# Patient Record
Sex: Female | Born: 1935 | Race: White | Hispanic: No | Marital: Married | Smoking: Former smoker
Health system: Southern US, Community
[De-identification: ages and names within clinical notes are randomized; demographics above are authoritative.]

## PROBLEM LIST (undated history)

## (undated) DIAGNOSIS — M069 Rheumatoid arthritis, unspecified: Secondary | ICD-10-CM

## (undated) DIAGNOSIS — E039 Hypothyroidism, unspecified: Secondary | ICD-10-CM

## (undated) DIAGNOSIS — N189 Chronic kidney disease, unspecified: Secondary | ICD-10-CM

## (undated) DIAGNOSIS — I10 Essential (primary) hypertension: Secondary | ICD-10-CM

## (undated) DIAGNOSIS — R32 Unspecified urinary incontinence: Secondary | ICD-10-CM

## (undated) DIAGNOSIS — I482 Chronic atrial fibrillation, unspecified: Secondary | ICD-10-CM

## (undated) DIAGNOSIS — I4891 Unspecified atrial fibrillation: Secondary | ICD-10-CM

## (undated) DIAGNOSIS — M1990 Unspecified osteoarthritis, unspecified site: Secondary | ICD-10-CM

## (undated) HISTORY — PX: ABDOMINAL HYSTERECTOMY: SHX81

## (undated) HISTORY — DX: Hypothyroidism, unspecified: E03.9

## (undated) HISTORY — DX: Chronic atrial fibrillation, unspecified: I48.20

## (undated) HISTORY — DX: Unspecified urinary incontinence: R32

---

## 1988-07-26 HISTORY — PX: ABDOMINAL HYSTERECTOMY: SHX81

## 2012-11-30 ENCOUNTER — Encounter (HOSPITAL_BASED_OUTPATIENT_CLINIC_OR_DEPARTMENT_OTHER): Payer: Medicare Other

## 2013-01-08 DIAGNOSIS — I272 Pulmonary hypertension, unspecified: Secondary | ICD-10-CM | POA: Insufficient documentation

## 2013-01-08 DIAGNOSIS — E785 Hyperlipidemia, unspecified: Secondary | ICD-10-CM | POA: Insufficient documentation

## 2013-05-17 ENCOUNTER — Emergency Department (HOSPITAL_COMMUNITY): Payer: Medicare Other

## 2013-05-17 ENCOUNTER — Emergency Department (HOSPITAL_COMMUNITY)
Admission: EM | Admit: 2013-05-17 | Discharge: 2013-05-18 | Disposition: A | Discharge status: Home / Self Care | Payer: Medicare Other | Attending: Emergency Medicine | Admitting: Emergency Medicine

## 2013-05-17 ENCOUNTER — Encounter (HOSPITAL_COMMUNITY): Payer: Self-pay | Admitting: Emergency Medicine

## 2013-05-17 DIAGNOSIS — R141 Gas pain: Secondary | ICD-10-CM | POA: Insufficient documentation

## 2013-05-17 DIAGNOSIS — Z87891 Personal history of nicotine dependence: Secondary | ICD-10-CM | POA: Insufficient documentation

## 2013-05-17 DIAGNOSIS — I10 Essential (primary) hypertension: Secondary | ICD-10-CM | POA: Insufficient documentation

## 2013-05-17 DIAGNOSIS — W010XXA Fall on same level from slipping, tripping and stumbling without subsequent striking against object, initial encounter: Secondary | ICD-10-CM | POA: Insufficient documentation

## 2013-05-17 DIAGNOSIS — Y9301 Activity, walking, marching and hiking: Secondary | ICD-10-CM | POA: Insufficient documentation

## 2013-05-17 DIAGNOSIS — K560 Paralytic ileus: Secondary | ICD-10-CM | POA: Insufficient documentation

## 2013-05-17 DIAGNOSIS — R142 Eructation: Secondary | ICD-10-CM | POA: Insufficient documentation

## 2013-05-17 DIAGNOSIS — R109 Unspecified abdominal pain: Secondary | ICD-10-CM | POA: Insufficient documentation

## 2013-05-17 DIAGNOSIS — M546 Pain in thoracic spine: Secondary | ICD-10-CM | POA: Insufficient documentation

## 2013-05-17 DIAGNOSIS — Z79899 Other long term (current) drug therapy: Secondary | ICD-10-CM | POA: Insufficient documentation

## 2013-05-17 DIAGNOSIS — S32009A Unspecified fracture of unspecified lumbar vertebra, initial encounter for closed fracture: Secondary | ICD-10-CM | POA: Insufficient documentation

## 2013-05-17 DIAGNOSIS — Z7901 Long term (current) use of anticoagulants: Secondary | ICD-10-CM | POA: Insufficient documentation

## 2013-05-17 DIAGNOSIS — M129 Arthropathy, unspecified: Secondary | ICD-10-CM | POA: Insufficient documentation

## 2013-05-17 DIAGNOSIS — Y929 Unspecified place or not applicable: Secondary | ICD-10-CM | POA: Insufficient documentation

## 2013-05-17 DIAGNOSIS — K5900 Constipation, unspecified: Secondary | ICD-10-CM | POA: Insufficient documentation

## 2013-05-17 DIAGNOSIS — S32000A Wedge compression fracture of unspecified lumbar vertebra, initial encounter for closed fracture: Secondary | ICD-10-CM

## 2013-05-17 DIAGNOSIS — N949 Unspecified condition associated with female genital organs and menstrual cycle: Secondary | ICD-10-CM | POA: Insufficient documentation

## 2013-05-17 DIAGNOSIS — R112 Nausea with vomiting, unspecified: Secondary | ICD-10-CM | POA: Insufficient documentation

## 2013-05-17 DIAGNOSIS — I4891 Unspecified atrial fibrillation: Secondary | ICD-10-CM | POA: Insufficient documentation

## 2013-05-17 HISTORY — DX: Unspecified atrial fibrillation: I48.91

## 2013-05-17 HISTORY — DX: Essential (primary) hypertension: I10

## 2013-05-17 HISTORY — DX: Unspecified osteoarthritis, unspecified site: M19.90

## 2013-05-17 LAB — CBC WITH DIFFERENTIAL/PLATELET
Eosinophils Absolute: 0.1 10*3/uL (ref 0.0–0.7)
Eosinophils Relative: 2 % (ref 0–5)
Hemoglobin: 11.9 g/dL — ABNORMAL LOW (ref 12.0–15.0)
Lymphs Abs: 1.5 10*3/uL (ref 0.7–4.0)
MCH: 27.4 pg (ref 26.0–34.0)
MCV: 82.5 fL (ref 78.0–100.0)
Monocytes Absolute: 0.8 10*3/uL (ref 0.1–1.0)
Monocytes Relative: 11 % (ref 3–12)
Neutro Abs: 5.1 10*3/uL (ref 1.7–7.7)
Platelets: 456 10*3/uL — ABNORMAL HIGH (ref 150–400)
RBC: 4.34 MIL/uL (ref 3.87–5.11)

## 2013-05-17 LAB — COMPREHENSIVE METABOLIC PANEL WITH GFR
ALT: 26 U/L (ref 0–35)
AST: 42 U/L — ABNORMAL HIGH (ref 0–37)
Albumin: 3.2 g/dL — ABNORMAL LOW (ref 3.5–5.2)
Alkaline Phosphatase: 76 U/L (ref 39–117)
BUN: 21 mg/dL (ref 6–23)
CO2: 29 meq/L (ref 19–32)
Calcium: 10.4 mg/dL (ref 8.4–10.5)
Chloride: 93 meq/L — ABNORMAL LOW (ref 96–112)
Creatinine, Ser: 0.79 mg/dL (ref 0.50–1.10)
GFR calc Af Amer: 89 mL/min — ABNORMAL LOW
GFR calc non Af Amer: 77 mL/min — ABNORMAL LOW
Glucose, Bld: 163 mg/dL — ABNORMAL HIGH (ref 70–99)
Potassium: 4 meq/L (ref 3.5–5.1)
Sodium: 134 meq/L — ABNORMAL LOW (ref 135–145)
Total Bilirubin: 0.3 mg/dL (ref 0.3–1.2)
Total Protein: 8.6 g/dL — ABNORMAL HIGH (ref 6.0–8.3)

## 2013-05-17 LAB — LIPASE, BLOOD: Lipase: 31 U/L (ref 11–59)

## 2013-05-17 MED ORDER — HYDROCODONE-ACETAMINOPHEN 5-325 MG PO TABS: 2.0000 | ORAL_TABLET | Freq: Four times a day (QID) | ORAL | Status: DC | PRN

## 2013-05-17 MED ORDER — POLYETHYLENE GLYCOL 3350 17 G PO PACK: 17.0000 g | PACK | Freq: Every day | ORAL | Status: DC

## 2013-05-17 MED ORDER — IOHEXOL 300 MG/ML  SOLN
80.0000 mL | Freq: Once | INTRAMUSCULAR | Status: AC | PRN
  Administered 2013-05-17: 80 mL via INTRAVENOUS

## 2013-05-17 MED ORDER — POLYETHYLENE GLYCOL 3350 17 GM/SCOOP PO POWD
1.0000 | Freq: Once | ORAL | Status: AC
  Administered 2013-05-17: 1 via ORAL
  Filled 2013-05-17: qty 255

## 2013-05-17 MED ORDER — SODIUM CHLORIDE 0.9 % IV BOLUS (SEPSIS)
1000.0000 mL | Freq: Once | INTRAVENOUS | Status: AC
  Administered 2013-05-17: 1000 mL via INTRAVENOUS

## 2013-05-17 MED ORDER — LACTULOSE 10 GM/15ML PO SOLN
20.0000 g | ORAL | Status: AC
  Administered 2013-05-17: 20 g via ORAL
  Filled 2013-05-17: qty 30

## 2013-05-17 MED ORDER — IOHEXOL 300 MG/ML  SOLN
25.0000 mL | INTRAMUSCULAR | Status: AC
Start: 2013-05-17 — End: 2013-05-17
  Administered 2013-05-17: 25 mL via ORAL

## 2013-05-17 MED ORDER — MINERAL OIL RE ENEM
1.0000 | ENEMA | Freq: Once | RECTAL | Status: AC
  Administered 2013-05-17: 1 via RECTAL
  Filled 2013-05-17 (×2): qty 1

## 2013-05-17 MED ORDER — HYDROCODONE-ACETAMINOPHEN 5-325 MG PO TABS
2.0000 | ORAL_TABLET | Freq: Once | ORAL | Status: AC
  Administered 2013-05-17: 2 via ORAL
  Filled 2013-05-17: qty 2

## 2013-05-17 MED ORDER — BISACODYL 10 MG RE SUPP
10.0000 mg | Freq: Once | RECTAL | Status: AC
  Administered 2013-05-17: 10 mg via RECTAL
  Filled 2013-05-17: qty 1

## 2013-05-17 MED ORDER — ONDANSETRON 4 MG PO TBDP
8.0000 mg | ORAL_TABLET | Freq: Once | ORAL | Status: AC
  Administered 2013-05-17: 8 mg via ORAL
  Filled 2013-05-17: qty 2

## 2013-05-17 MED ORDER — FLEET ENEMA 7-19 GM/118ML RE ENEM
1.0000 | ENEMA | Freq: Once | RECTAL | Status: AC
  Administered 2013-05-17: 1 via RECTAL
  Filled 2013-05-17: qty 1

## 2013-05-17 MED ORDER — SENNOSIDES-DOCUSATE SODIUM 8.6-50 MG PO TABS: 1.0000 | ORAL_TABLET | Freq: Once | ORAL | Status: DC

## 2013-05-17 NOTE — ED Notes (Signed)
Pt reported nausea.  ODT Zofran ordered under protocol.

## 2013-05-17 NOTE — ED Notes (Signed)
Called pharmacy re: miralax.  They stated that the order had not verified the order that was placed at 1618.  Also stated they will verify and send.

## 2013-05-17 NOTE — ED Notes (Signed)
Pt reports fall on Friday, seen at [facility]. Received CT and Xray, but family wanted pt to come to [facility]. Denies LOC with fall, reporting pain to lower back. PT also reports LBM on 10/17. AO x4.

## 2013-05-17 NOTE — ED Provider Notes (Addendum)
CSN: [id number]     Arrival date & time 05/17/13  1238 History   First MD Initiated Contact with Patient 05/17/13 1239     Chief Complaint  Patient presents with  . Fall   (Consider location/radiation/quality/duration/timing/severity/associated sxs/prior Treatment) The history is provided by the patient and a relative.  Zoe Robinson is a 79 y.o. female history of atrial fibrillation, hypertension here presenting with fall. She fell 6 days ago. She went to [facility] and had a CT and x-ray that showed compression fracture. She was placed on hydrocodone afterward and became constipated. Today she was walking on the threshold and slipped and fell on her back. Denies any head injury or loss of consciousness or syncope. Denies any hip pain but just pain in her back.    Past Medical History  Diagnosis Date  . Atrial fibrillation   . Arthritis   . Hypertension    Past Surgical History  Procedure Laterality Date  . Abdominal hysterectomy     No family history on file. History  Substance Use Topics  . Smoking status: Former Games developer  . Smokeless tobacco: Not on file  . Alcohol Use: No   OB History   Grav Para Term Preterm Abortions TAB SAB Ect Mult Living                 Review of Systems  Musculoskeletal: Positive for back pain.  All other systems reviewed and are negative.    Allergies  Review of patient's allergies indicates no known allergies.  Home Medications   Current Outpatient Rx  Name  Route  Sig  Dispense  Refill  . Calcium Carb-Cholecalciferol (CALCIUM 600 + D PO)   Oral   Take 1 tablet by mouth daily.         Marland Kitchen docusate sodium (COLACE) 100 MG capsule   Oral   Take 100 mg by mouth 2 (two) times daily.         . folic acid (FOLVITE) 1 MG tablet   Oral   Take 1 mg by mouth daily.         Marland Kitchen HYDROcodone-acetaminophen (NORCO/VICODIN) 5-325 MG per tablet   Oral   Take 1 tablet by mouth every 4 (four) hours as needed for pain.         .  magnesium gluconate (MAGONATE) 500 MG tablet   Oral   Take 500 mg by mouth daily.         . methotrexate (RHEUMATREX) 2.5 MG tablet   Oral   Take 7.5 mg by mouth every Wednesday. Caution:Chemotherapy. Protect from light.         Marland Kitchen oxybutynin (DITROPAN-XL) 10 MG 24 hr tablet   Oral   Take 10 mg by mouth daily.         . predniSONE (DELTASONE) 1 MG tablet   Oral   Take 1 mg by mouth 2 (two) times daily.         . predniSONE (DELTASONE) 5 MG tablet   Oral   Take 2.5 mg by mouth daily.         . risedronate (ACTONEL) 35 MG tablet   Oral   Take 35 mg by mouth every Tuesday. with water on empty stomach, nothing by mouth or lie down for next 30 minutes.         . Rivaroxaban (XARELTO) 15 MG TABS tablet   Oral   Take 15 mg by mouth daily.         Marland Kitchen  simvastatin (ZOCOR) 40 MG tablet   Oral   Take 20 mg by mouth every evening.         . valsartan (DIOVAN) 320 MG tablet   Oral   Take 320 mg by mouth daily.         . vitamin E 1000 UNIT capsule   Oral   Take 1,000 Units by mouth daily.          BP 119/64  Pulse 63  Temp(Src) 98.4 F (36.9 C) (Oral)  Resp 18  SpO2 96% Physical Exam  Nursing note and vitals reviewed. Constitutional: She is oriented to person, place, and time.  Chronically ill, comfortable   HENT:  Head: Normocephalic and atraumatic.  Mouth/Throat: Oropharynx is clear and moist.  Eyes: Conjunctivae are normal. Pupils are equal, round, and reactive to light.  Neck: Normal range of motion. Neck supple.  Cardiovascular: Normal rate, regular rhythm and normal heart sounds.   Pulmonary/Chest: Effort normal and breath sounds normal. No respiratory distress. She has no wheezes. She has no rales.  Abdominal: Soft. Bowel sounds are normal. She exhibits no distension. There is no tenderness. There is no rebound and no guarding.  Genitourinary:  Rectal- some stool at vault, no obvious stool impaction   Musculoskeletal: Normal range of motion.   Mild lower thoracic midline tenderness. Nl ROM bilateral hips. Bruise on L hip.   Neurological: She is alert and oriented to person, place, and time. No cranial nerve deficit. Coordination normal.  Skin: Skin is warm and dry.  Psychiatric: She has a normal mood and affect. Her behavior is normal. Judgment and thought content normal.    ED Course  Procedures (including critical care time) Labs Review Labs Reviewed - No data to display Imaging Review Dg Lumbar Spine Complete  05/17/2013   CLINICAL DATA:  Larey Seat.  L1 compression fracture.  EXAM: LUMBAR SPINE - COMPLETE 4+ VIEW  COMPARISON:  Lumbar spine CT 05/11/2013.  FINDINGS: Stable alignment of the lumbar vertebral bodies. No definite pars defects. Stable severe compression fracture of L1. I do not see any obvious further compression. The other vertebral bodies are maintained.  IMPRESSION: Stable L1 compression fracture.  Diffuse ileus.   Electronically Signed   By: Loralie Champagne M.D.   On: 05/17/2013 14:03   Dg Pelvis 1-2 Views  05/17/2013   CLINICAL DATA:  Larey Seat. Pelvic pain.  EXAM: PELVIS - 1-2 VIEW  COMPARISON:  None.  FINDINGS: Diffuse abdominal ileus and constipation suspected. The bowel obscures part of the bony pelvis. Both hips are normally located. No acute hip fracture. The pubic symphysis and SI joints appear normal. No definite pelvic fractures.  IMPRESSION: No acute bony findings.   Electronically Signed   By: Loralie Champagne M.D.   On: 05/17/2013 14:01    EKG Interpretation   None       MDM  No diagnosis found. Zoe Robinson is a 79 y.o. female here with s/p fall. Will get xray to r/o fracture. Also constipated so will do enema.   3:39 PM Xray showed stable compression fracture. Patient able to ambulate and no neuro deficit. Recommend miralax at home and f/u with ortho. Family wanted patient to have a bowel movement in the ED. Will give miralax.   7:56 PM Finished bowel prep with no bowel movement. Will do labs  and CT ab/pel. I signed out PA to f/u CT and reassess patient.     Richardean Canal, MD 05/17/13 1540  Richardean Canal,  MD 05/17/13 1610

## 2013-05-17 NOTE — ED Provider Notes (Signed)
Pt received from Dr. Silverio Lay.  Pt is taking vicodin q4 hours for pain associated w/ compression fx of vertebrae.  Has not had a BM in 6 days and presents today w/ abd pain.  CT shows rectosigmoid stool impaction vs. Inflammatory process w/ liquid stool.  Currently has mild pain in left lower abd and 5/10 pain in her back.  Her daughter plans to take her home with her tonight, but she would prefer that she is not discharged until the constipation is treated.  Will try lactulose po and bisacodyl PR and reassess shortly.  Abd currently mildly distended w/ LLQ ttp.  If she has a BM and sx improve, will d/c home w/ miralax.  Recommended taking her vicodin only when absolutely necessary.  10:27 PM   Pt is sitting on bedside commode.  She continues to have mild abd pain and distension.  Dr. Dierdre Highman recommended IVF and enema, but patient has already had two.  Will give her some more time.  12:17 AM  Pt had a large, watery BM.  Her pain continues to improve.  Liquid stool most likely secondary to the medications she received, but some suspicion for colitis based on CT results as well.  Will prescribed cipro/flagyl.  Re-examined for signs of spinal stenosis/cauda equina.  Pt has rectal tone, no saddle anesthesia, 5/5 hip abduction/adduction and ankle plantar/dorsiflexion strength, no sensory deficits.  Recommended vicodin only when absolutely necessary for back pain, daily miralax, getting out of bed and walking at least bid w/ assistance, and f/u with both PCP and NS.  Appt w/ Dr. Yetta Barre Monday.  Return precautions discussed. 2:08 AM   Otilio Miu, PA-C 05/18/13 [phone]

## 2013-05-17 NOTE — ED Notes (Signed)
Amber in CT notified that pt finished 1st cup of contrast.

## 2013-05-18 MED ORDER — CIPROFLOXACIN HCL 500 MG PO TABS: 500.0000 mg | ORAL_TABLET | Freq: Two times a day (BID) | ORAL | Status: DC

## 2013-05-18 MED ORDER — METRONIDAZOLE 500 MG PO TABS: 500.0000 mg | ORAL_TABLET | Freq: Two times a day (BID) | ORAL | Status: DC

## 2013-05-18 MED ORDER — SODIUM CHLORIDE 0.9 % IV BOLUS (SEPSIS)
1000.0000 mL | Freq: Once | INTRAVENOUS | Status: AC
  Administered 2013-05-18: 1000 mL via INTRAVENOUS

## 2013-05-22 NOTE — ED Provider Notes (Signed)
Medical screening examination/treatment/procedure(s) were conducted as a shared visit with non-physician practitioner(s) and myself.  I personally evaluated the patient during the encounter.  See my separate note   EKG Interpretation   None       Results for orders placed during the hospital encounter of 05/17/13  CBC WITH DIFFERENTIAL      Result Value Range   WBC 7.5  4.0 - 10.5 K/uL   RBC 4.34  3.87 - 5.11 MIL/uL   Hemoglobin 11.9 (*) 12.0 - 15.0 g/dL   HCT 16.1 (*) 09.6 - 04.5 %   MCV 82.5  78.0 - 100.0 fL   MCH 27.4  26.0 - 34.0 pg   MCHC 33.2  30.0 - 36.0 g/dL   RDW 40.9  81.1 - 91.4 %   Platelets 456 (*) 150 - 400 K/uL   Neutrophils Relative % 68  43 - 77 %   Neutro Abs 5.1  1.7 - 7.7 K/uL   Lymphocytes Relative 20  12 - 46 %   Lymphs Abs 1.5  0.7 - 4.0 K/uL   Monocytes Relative 11  3 - 12 %   Monocytes Absolute 0.8  0.1 - 1.0 K/uL   Eosinophils Relative 2  0 - 5 %   Eosinophils Absolute 0.1  0.0 - 0.7 K/uL   Basophils Relative 0  0 - 1 %   Basophils Absolute 0.0  0.0 - 0.1 K/uL  COMPREHENSIVE METABOLIC PANEL      Result Value Range   Sodium 134 (*) 135 - 145 mEq/L   Potassium 4.0  3.5 - 5.1 mEq/L   Chloride 93 (*) 96 - 112 mEq/L   CO2 29  19 - 32 mEq/L   Glucose, Bld 163 (*) 70 - 99 mg/dL   BUN 21  6 - 23 mg/dL   Creatinine, Ser 7.82  0.50 - 1.10 mg/dL   Calcium 95.6  8.4 - 21.3 mg/dL   Total Protein 8.6 (*) 6.0 - 8.3 g/dL   Albumin 3.2 (*) 3.5 - 5.2 g/dL   AST 42 (*) 0 - 37 U/L   ALT 26  0 - 35 U/L   Alkaline Phosphatase 76  39 - 117 U/L   Total Bilirubin 0.3  0.3 - 1.2 mg/dL   GFR calc non Af Amer 77 (*) >90 mL/min   GFR calc Af Amer 89 (*) >90 mL/min  LIPASE, BLOOD      Result Value Range   Lipase 31  11 - 59 U/L   Dg Lumbar Spine Complete  05/17/2013   CLINICAL DATA:  Larey Seat.  L1 compression fracture.  EXAM: LUMBAR SPINE - COMPLETE 4+ VIEW  COMPARISON:  Lumbar spine CT 05/11/2013.  FINDINGS: Stable alignment of the lumbar vertebral bodies. No definite  pars defects. Stable severe compression fracture of L1. I do not see any obvious further compression. The other vertebral bodies are maintained.  IMPRESSION: Stable L1 compression fracture.  Diffuse ileus.   Electronically Signed   By: Loralie Champagne M.D.   On: 05/17/2013 14:03   Dg Pelvis 1-2 Views  05/17/2013   CLINICAL DATA:  Larey Seat. Pelvic pain.  EXAM: PELVIS - 1-2 VIEW  COMPARISON:  None.  FINDINGS: Diffuse abdominal ileus and constipation suspected. The bowel obscures part of the bony pelvis. Both hips are normally located. No acute hip fracture. The pubic symphysis and SI joints appear normal. No definite pelvic fractures.  IMPRESSION: No acute bony findings.   Electronically Signed   By: Loralie Champagne  M.D.   On: 05/17/2013 14:01   Ct Abdomen Pelvis W Contrast  05/17/2013   CLINICAL DATA:  Left-sided abdominal pain. Nausea and vomiting. Constipation.  EXAM: CT ABDOMEN AND PELVIS WITH CONTRAST  TECHNIQUE: Multidetector CT imaging of the abdomen and pelvis was performed using the standard protocol following bolus administration of intravenous contrast.  CONTRAST:  80mL OMNIPAQUE IOHEXOL 300 MG/ML  SOLN  COMPARISON:  None.  FINDINGS: The dependent atelectasis in the lung bases. Minimal right pleural effusion. Mild cardiac enlargement. Small pericardial effusion. Residual contrast material in the esophagus suggest dysmotility or reflux.  The liver, spleen, gallbladder, pancreas, adrenal glands, kidneys, inferior vena cava, and retroperitoneal lymph nodes are unremarkable. Calcification of the abdominal aorta without aneurysm. Mild hazy opacity in the retrocrural space on the right is nonspecific but could represent the inflammation or lymphadenopathy. This measures about 1.9 cm. The stomach is normal. The small bowel is not abnormally distended. Fluid fills the distal small bowel. The colon is diffusely distended and fluid filled. Prominent stool in the rectosigmoid colon. No definite colonic wall  thickening. Changes may represent pseudo-obstruction versus inflammatory process. No free air or free fluid in the abdomen.  Pelvis: The bladder wall is not thickened. No free or loculated pelvic fluid collections. The appendix is not identified. No significant pelvic lymphadenopathy. Degenerative changes in the lumbar spine. Compression of the L1 vertebra. This was present without change of the prior CT lumbar spine dated 05/11/2013.  IMPRESSION: Fluid distention of the entire colon with prominent stool in the rectosigmoid region. Changes may represent impaction with pseudo obstruction versus inflammatory process with liquid stool. No definite obstructing lesion. Small bowel are not distended.   Electronically Signed   By: Burman Nieves M.D.   On: 05/17/2013 21:52      Richardean Canal, MD 05/22/13 2129

## 2014-04-14 ENCOUNTER — Emergency Department (HOSPITAL_COMMUNITY): Payer: Medicare Other

## 2014-04-14 ENCOUNTER — Inpatient Hospital Stay (HOSPITAL_COMMUNITY)
Admission: EM | Admit: 2014-04-14 | Discharge: 2014-04-27 | DRG: 682 | Disposition: A | Discharge status: Skilled Nursing Facility | Payer: Medicare Other | Attending: Internal Medicine | Admitting: Internal Medicine

## 2014-04-14 ENCOUNTER — Encounter (HOSPITAL_COMMUNITY): Payer: Self-pay | Admitting: Emergency Medicine

## 2014-04-14 DIAGNOSIS — R1310 Dysphagia, unspecified: Secondary | ICD-10-CM | POA: Diagnosis present

## 2014-04-14 DIAGNOSIS — E43 Unspecified severe protein-calorie malnutrition: Secondary | ICD-10-CM | POA: Diagnosis present

## 2014-04-14 DIAGNOSIS — Z7901 Long term (current) use of anticoagulants: Secondary | ICD-10-CM | POA: Diagnosis not present

## 2014-04-14 DIAGNOSIS — R5081 Fever presenting with conditions classified elsewhere: Secondary | ICD-10-CM

## 2014-04-14 DIAGNOSIS — Z8249 Family history of ischemic heart disease and other diseases of the circulatory system: Secondary | ICD-10-CM | POA: Diagnosis not present

## 2014-04-14 DIAGNOSIS — N170 Acute kidney failure with tubular necrosis: Principal | ICD-10-CM | POA: Diagnosis present

## 2014-04-14 DIAGNOSIS — IMO0002 Reserved for concepts with insufficient information to code with codable children: Secondary | ICD-10-CM | POA: Diagnosis not present

## 2014-04-14 DIAGNOSIS — N183 Chronic kidney disease, stage 3 unspecified: Secondary | ICD-10-CM | POA: Diagnosis not present

## 2014-04-14 DIAGNOSIS — D638 Anemia in other chronic diseases classified elsewhere: Secondary | ICD-10-CM | POA: Diagnosis present

## 2014-04-14 DIAGNOSIS — Z809 Family history of malignant neoplasm, unspecified: Secondary | ICD-10-CM

## 2014-04-14 DIAGNOSIS — I10 Essential (primary) hypertension: Secondary | ICD-10-CM

## 2014-04-14 DIAGNOSIS — R627 Adult failure to thrive: Secondary | ICD-10-CM | POA: Diagnosis present

## 2014-04-14 DIAGNOSIS — Z6820 Body mass index (BMI) 20.0-20.9, adult: Secondary | ICD-10-CM

## 2014-04-14 DIAGNOSIS — M810 Age-related osteoporosis without current pathological fracture: Secondary | ICD-10-CM | POA: Diagnosis not present

## 2014-04-14 DIAGNOSIS — Z79899 Other long term (current) drug therapy: Secondary | ICD-10-CM

## 2014-04-14 DIAGNOSIS — I4891 Unspecified atrial fibrillation: Secondary | ICD-10-CM | POA: Diagnosis present

## 2014-04-14 DIAGNOSIS — Z87891 Personal history of nicotine dependence: Secondary | ICD-10-CM | POA: Diagnosis not present

## 2014-04-14 DIAGNOSIS — A498 Other bacterial infections of unspecified site: Secondary | ICD-10-CM | POA: Diagnosis not present

## 2014-04-14 DIAGNOSIS — M069 Rheumatoid arthritis, unspecified: Secondary | ICD-10-CM | POA: Diagnosis present

## 2014-04-14 DIAGNOSIS — K5900 Constipation, unspecified: Secondary | ICD-10-CM | POA: Diagnosis not present

## 2014-04-14 DIAGNOSIS — R531 Weakness: Secondary | ICD-10-CM

## 2014-04-14 DIAGNOSIS — Z808 Family history of malignant neoplasm of other organs or systems: Secondary | ICD-10-CM | POA: Diagnosis not present

## 2014-04-14 DIAGNOSIS — E876 Hypokalemia: Secondary | ICD-10-CM | POA: Diagnosis not present

## 2014-04-14 DIAGNOSIS — R51 Headache: Secondary | ICD-10-CM | POA: Diagnosis present

## 2014-04-14 DIAGNOSIS — Z7952 Long term (current) use of systemic steroids: Secondary | ICD-10-CM

## 2014-04-14 DIAGNOSIS — N390 Urinary tract infection, site not specified: Secondary | ICD-10-CM | POA: Diagnosis present

## 2014-04-14 DIAGNOSIS — B9620 Unspecified Escherichia coli [E. coli] as the cause of diseases classified elsewhere: Secondary | ICD-10-CM | POA: Diagnosis present

## 2014-04-14 DIAGNOSIS — N179 Acute kidney failure, unspecified: Secondary | ICD-10-CM

## 2014-04-14 DIAGNOSIS — Z800 Family history of malignant neoplasm of digestive organs: Secondary | ICD-10-CM

## 2014-04-14 DIAGNOSIS — R630 Anorexia: Secondary | ICD-10-CM | POA: Diagnosis present

## 2014-04-14 DIAGNOSIS — I129 Hypertensive chronic kidney disease with stage 1 through stage 4 chronic kidney disease, or unspecified chronic kidney disease: Secondary | ICD-10-CM | POA: Diagnosis present

## 2014-04-14 DIAGNOSIS — D721 Eosinophilia, unspecified: Secondary | ICD-10-CM | POA: Diagnosis not present

## 2014-04-14 DIAGNOSIS — M948X9 Other specified disorders of cartilage, unspecified sites: Secondary | ICD-10-CM | POA: Diagnosis not present

## 2014-04-14 DIAGNOSIS — Z23 Encounter for immunization: Secondary | ICD-10-CM | POA: Diagnosis not present

## 2014-04-14 DIAGNOSIS — D709 Neutropenia, unspecified: Secondary | ICD-10-CM | POA: Diagnosis present

## 2014-04-14 DIAGNOSIS — D649 Anemia, unspecified: Secondary | ICD-10-CM

## 2014-04-14 DIAGNOSIS — D759 Disease of blood and blood-forming organs, unspecified: Secondary | ICD-10-CM | POA: Diagnosis not present

## 2014-04-14 DIAGNOSIS — D61818 Other pancytopenia: Secondary | ICD-10-CM | POA: Diagnosis present

## 2014-04-14 DIAGNOSIS — T83511S Infection and inflammatory reaction due to indwelling urethral catheter, sequela: Secondary | ICD-10-CM

## 2014-04-14 DIAGNOSIS — E860 Dehydration: Secondary | ICD-10-CM | POA: Diagnosis present

## 2014-04-14 DIAGNOSIS — N189 Chronic kidney disease, unspecified: Secondary | ICD-10-CM | POA: Diagnosis present

## 2014-04-14 DIAGNOSIS — R509 Fever, unspecified: Secondary | ICD-10-CM | POA: Diagnosis present

## 2014-04-14 DIAGNOSIS — D509 Iron deficiency anemia, unspecified: Secondary | ICD-10-CM | POA: Diagnosis present

## 2014-04-14 HISTORY — DX: Chronic kidney disease, unspecified: N18.9

## 2014-04-14 HISTORY — DX: Rheumatoid arthritis, unspecified: M06.9

## 2014-04-14 LAB — CBC
HEMATOCRIT: 28 % — AB (ref 36.0–46.0)
HEMOGLOBIN: 9.3 g/dL — AB (ref 12.0–15.0)
MCH: 30.2 pg (ref 26.0–34.0)
MCHC: 33.2 g/dL (ref 30.0–36.0)
MCV: 90.9 fL (ref 78.0–100.0)
Platelets: 502 10*3/uL — ABNORMAL HIGH (ref 150–400)
RBC: 3.08 MIL/uL — ABNORMAL LOW (ref 3.87–5.11)
RDW: 17.6 % — AB (ref 11.5–15.5)
WBC: 2.8 10*3/uL — AB (ref 4.0–10.5)

## 2014-04-14 LAB — URINE MICROSCOPIC-ADD ON

## 2014-04-14 LAB — BASIC METABOLIC PANEL
Anion gap: 10 (ref 5–15)
BUN: 52 mg/dL — ABNORMAL HIGH (ref 6–23)
CHLORIDE: 92 meq/L — AB (ref 96–112)
CO2: 33 mEq/L — ABNORMAL HIGH (ref 19–32)
Calcium: >15 mg/dL (ref 8.4–10.5)
Creatinine, Ser: 1.62 mg/dL — ABNORMAL HIGH (ref 0.50–1.10)
GFR, EST AFRICAN AMERICAN: 34 mL/min — AB (ref >90)
GFR, EST NON AFRICAN AMERICAN: 29 mL/min — AB (ref >90)
GLUCOSE: 121 mg/dL — AB (ref 70–99)
POTASSIUM: 4.5 meq/L (ref 3.7–5.3)
Sodium: 135 mEq/L — ABNORMAL LOW (ref 137–147)

## 2014-04-14 LAB — URINALYSIS, ROUTINE W REFLEX MICROSCOPIC
Bilirubin Urine: NEGATIVE
Glucose, UA: NEGATIVE mg/dL
Ketones, ur: NEGATIVE mg/dL
Nitrite: POSITIVE — AB
PROTEIN: NEGATIVE mg/dL
Specific Gravity, Urine: 1.014 (ref 1.005–1.030)
Urobilinogen, UA: 0.2 mg/dL (ref 0.0–1.0)
pH: 7 (ref 5.0–8.0)

## 2014-04-14 LAB — APTT: APTT: 35 s (ref 24–37)

## 2014-04-14 MED ORDER — DEXTROSE 5 % IV SOLN
1.0000 g | Freq: Once | INTRAVENOUS | Status: AC
  Administered 2014-04-15: 1 g via INTRAVENOUS
  Filled 2014-04-14: qty 10

## 2014-04-14 MED ORDER — SODIUM CHLORIDE 0.9 % IV BOLUS (SEPSIS)
1000.0000 mL | Freq: Once | INTRAVENOUS | Status: AC
  Administered 2014-04-15: 1000 mL via INTRAVENOUS

## 2014-04-14 NOTE — ED Notes (Signed)
One week Tuesday had a headache and weakness, nosebleed on Thursday, progressive increase in weakness.  Pt is pale with mild yellowish color.  Pt is Xarelto.  Weakness all over.  No pain anywhere at this time

## 2014-04-14 NOTE — ED Notes (Signed)
MD at bedside. 

## 2014-04-14 NOTE — ED Provider Notes (Signed)
CSN: [id number]     Arrival date & time 04/14/14  2148 History   First MD Initiated Contact with Patient 04/14/14 2213     Chief Complaint  Patient presents with  . Weakness    Patient is a 80 y.o. female presenting with weakness. The history is provided by the patient and a relative.  Weakness This is a new problem. Episode onset: 1 week gradually. The problem occurs constantly. The problem has been gradually worsening. Associated symptoms include anorexia (pt is only eating 1 egg and piece of toast per day), coughing (NPNB), headaches (none currently, had one 3 days ago) and weakness. Pertinent negatives include no abdominal pain, change in bowel habit, chest pain, chills, congestion, diaphoresis, fever, myalgias, nausea, numbness, visual change or vomiting.   Pt presents for 1 week of generalized fatigue and weakness.  She has had very poor PO intake due to decreased appetite.  She has not fallen recently.  Broke lower back last year after a fall, but normally able to ambulate on own accord. She now needs assistance to go to restroom. On Xarelto for a fib. Has not struck head.  Currently denies pain.    Past Medical History  Diagnosis Date  . Atrial fibrillation   . Arthritis   . Hypertension    Past Surgical History  Procedure Laterality Date  . Abdominal hysterectomy     No family history on file. History  Substance Use Topics  . Smoking status: Former Games developer  . Smokeless tobacco: Not on file  . Alcohol Use: No   OB History   Grav Para Term Preterm Abortions TAB SAB Ect Mult Living                 Review of Systems  Constitutional: Negative for fever, chills and diaphoresis. Unexpected weight change: unknown, baseline 100 lbs.  HENT: Negative for congestion.   Respiratory: Positive for cough (NPNB). Negative for shortness of breath.   Cardiovascular: Negative for chest pain and leg swelling.  Gastrointestinal: Positive for anorexia (pt is only eating 1 egg and piece of  toast per day). Negative for nausea, vomiting, abdominal pain, diarrhea, blood in stool, abdominal distention and change in bowel habit.  Genitourinary: Positive for dysuria (sometimes). Negative for vaginal bleeding.  Musculoskeletal: Positive for gait problem. Negative for back pain and myalgias.  Neurological: Positive for weakness and headaches (none currently, had one 3 days ago). Negative for numbness.  All other systems reviewed and are negative.     Allergies  Review of patient's allergies indicates no known allergies.  Home Medications   Prior to Admission medications   Medication Sig Start Date End Date Taking? Authorizing Provider  Calcium Carb-Cholecalciferol (CALCIUM 600 + D PO) Take 1 tablet by mouth daily.    Historical Provider, MD  ciprofloxacin (CIPRO) 500 MG tablet Take 1 tablet (500 mg total) by mouth 2 (two) times daily. 05/18/13   Arie Sabina Schinlever, PA-C  docusate sodium (COLACE) 100 MG capsule Take 100 mg by mouth 2 (two) times daily.    Historical Provider, MD  folic acid (FOLVITE) 1 MG tablet Take 1 mg by mouth daily.    Historical Provider, MD  HYDROcodone-acetaminophen (NORCO/VICODIN) 5-325 MG per tablet Take 1 tablet by mouth every 4 (four) hours as needed for pain.    Historical Provider, MD  magnesium gluconate (MAGONATE) 500 MG tablet Take 500 mg by mouth daily.    Historical Provider, MD  methotrexate (RHEUMATREX) 2.5 MG tablet Take 7.5 mg  by mouth every Wednesday. Caution:Chemotherapy. Protect from light.    Historical Provider, MD  metroNIDAZOLE (FLAGYL) 500 MG tablet Take 1 tablet (500 mg total) by mouth 2 (two) times daily. 05/18/13   Arie Sabina Schinlever, PA-C  oxybutynin (DITROPAN-XL) 10 MG 24 hr tablet Take 10 mg by mouth daily.    Historical Provider, MD  predniSONE (DELTASONE) 1 MG tablet Take 1 mg by mouth 2 (two) times daily.    Historical Provider, MD  predniSONE (DELTASONE) 5 MG tablet Take 2.5 mg by mouth daily.    Historical Provider, MD   risedronate (ACTONEL) 35 MG tablet Take 35 mg by mouth every Tuesday. with water on empty stomach, nothing by mouth or lie down for next 30 minutes.    Historical Provider, MD  Rivaroxaban (XARELTO) 15 MG TABS tablet Take 15 mg by mouth daily.    Historical Provider, MD  simvastatin (ZOCOR) 40 MG tablet Take 20 mg by mouth every evening.    Historical Provider, MD  valsartan (DIOVAN) 320 MG tablet Take 320 mg by mouth daily.    Historical Provider, MD  vitamin E 1000 UNIT capsule Take 1,000 Units by mouth daily.    Historical Provider, MD   BP 142/61  Pulse 79  Temp(Src) 97.4 F (36.3 C) (Oral)  Resp 16  SpO2 93% Physical Exam  Nursing note and vitals reviewed. Constitutional: She is oriented to person, place, and time. No distress.  Frail, elderly, malnourished  HENT:  Head: Normocephalic and atraumatic.  Nose: Nose normal.  Mouth/Throat: No oropharyngeal exudate.  Eyes: Pupils are equal, round, and reactive to light.  Conj pallor  Neck: Normal range of motion. Neck supple. No tracheal deviation present.  Cardiovascular: Normal rate, regular rhythm, normal heart sounds and intact distal pulses.   No murmur heard. Pulmonary/Chest: Effort normal and breath sounds normal. No respiratory distress. She has no wheezes. She has no rales. She exhibits no tenderness.  Abdominal: Soft. Bowel sounds are normal. She exhibits no distension and no mass. There is no tenderness. There is no rebound and no guarding.  Musculoskeletal: Normal range of motion. She exhibits no edema and no tenderness.  Neurological: She is alert and oriented to person, place, and time.  Alert and oriented x3. CN 3-12 tested and without deficit. 5/5 muscle strength in all extremities with flexion and extension.  Normal bulk and tone.  No sensory deficit to light touch.  Gait deferred. Symmetric and equal 2+ patellar and brachioradialis DTRs.  No pronator drift.  Normal finger-to-nose.    Skin: Skin is warm and dry. No  rash noted. There is pallor.  Psychiatric: She has a normal mood and affect.    ED Course  Procedures (including critical care time) Labs Review Labs Reviewed  CBC - Abnormal; Notable for the following:    WBC 2.8 (*)    RBC 3.08 (*)    Hemoglobin 9.3 (*)    HCT 28.0 (*)    RDW 17.6 (*)    Platelets 502 (*)    All other components within normal limits  BASIC METABOLIC PANEL - Abnormal; Notable for the following:    Sodium 135 (*)    Chloride 92 (*)    CO2 33 (*)    Glucose, Bld 121 (*)    BUN 52 (*)    Creatinine, Ser 1.62 (*)    Calcium >15.0 (*)    GFR calc non Af Amer 29 (*)    GFR calc Af Amer 34 (*)  All other components within normal limits  URINALYSIS, ROUTINE W REFLEX MICROSCOPIC - Abnormal; Notable for the following:    APPearance CLOUDY (*)    Hgb urine dipstick TRACE (*)    Nitrite POSITIVE (*)    Leukocytes, UA LARGE (*)    All other components within normal limits  URINE MICROSCOPIC-ADD ON - Abnormal; Notable for the following:    Bacteria, UA MANY (*)    All other components within normal limits  URINE CULTURE  APTT  PARATHYROID HORMONE, INTACT (NO CA)  PTH-RELATED PEPTIDE  VITAMIN D 1,25 DIHYDROXY  VITAMIN D 25 HYDROXY    Imaging Review Dg Chest 2 View  04/15/2014   CLINICAL DATA:  Weakness for 1 week, headache, jaundice  EXAM: CHEST  2 VIEW  COMPARISON:  None.  FINDINGS: Mild cardiac enlargement. Vascular pattern normal. No consolidation or effusion. Severe L1 compression deformity, age uncertain.  IMPRESSION: Mild cardiac enlargement.  L1 compression deformity.   Electronically Signed   By: Esperanza Heir M.D.   On: 04/15/2014 00:19     EKG Interpretation None      MDM   Final diagnoses:  UTI (lower urinary tract infection)  Generalized weakness  AKI (acute kidney injury)  Anemia, unspecified anemia type  Hypercalcemia    Pt presents for generalized weakness for past week.  Poor PO intake and having difficulty caring for self at  home.  Malnourished appearing.  Notes recent cough, no resp distress.  Otherwise no current symptoms. Neuro exam nonfocal, no recent falls, HA resolved a few days ago, defer CT head.  Screening labs sent.   Anemia noted on labs. Hgb 9.3. Hgb 9.0 on 9/6 at PCP office. Pt denies bleeding source such as vaginal or rectal.   UA concerning for UTI, culture sent.  Rocephin ordered.  +AKI with severe hypercalcemia. NS IVF ordered.  Ca 10.7 on 9/6  12:27 AM spoke with hospitalist who will admit.   Sofie Rower, MD 04/15/14 [phone]

## 2014-04-15 ENCOUNTER — Encounter (HOSPITAL_COMMUNITY): Payer: Self-pay | Admitting: Internal Medicine

## 2014-04-15 DIAGNOSIS — D509 Iron deficiency anemia, unspecified: Secondary | ICD-10-CM | POA: Diagnosis present

## 2014-04-15 DIAGNOSIS — I10 Essential (primary) hypertension: Secondary | ICD-10-CM | POA: Diagnosis present

## 2014-04-15 DIAGNOSIS — I4891 Unspecified atrial fibrillation: Secondary | ICD-10-CM | POA: Diagnosis present

## 2014-04-15 DIAGNOSIS — E43 Unspecified severe protein-calorie malnutrition: Secondary | ICD-10-CM | POA: Diagnosis present

## 2014-04-15 DIAGNOSIS — R531 Weakness: Secondary | ICD-10-CM | POA: Diagnosis present

## 2014-04-15 DIAGNOSIS — E46 Unspecified protein-calorie malnutrition: Secondary | ICD-10-CM | POA: Diagnosis present

## 2014-04-15 DIAGNOSIS — Z79899 Other long term (current) drug therapy: Secondary | ICD-10-CM | POA: Diagnosis not present

## 2014-04-15 DIAGNOSIS — D61818 Other pancytopenia: Secondary | ICD-10-CM | POA: Diagnosis present

## 2014-04-15 DIAGNOSIS — E876 Hypokalemia: Secondary | ICD-10-CM | POA: Diagnosis not present

## 2014-04-15 DIAGNOSIS — Z800 Family history of malignant neoplasm of digestive organs: Secondary | ICD-10-CM | POA: Diagnosis not present

## 2014-04-15 DIAGNOSIS — K5900 Constipation, unspecified: Secondary | ICD-10-CM | POA: Diagnosis present

## 2014-04-15 DIAGNOSIS — M069 Rheumatoid arthritis, unspecified: Secondary | ICD-10-CM | POA: Diagnosis present

## 2014-04-15 DIAGNOSIS — I129 Hypertensive chronic kidney disease with stage 1 through stage 4 chronic kidney disease, or unspecified chronic kidney disease: Secondary | ICD-10-CM | POA: Diagnosis present

## 2014-04-15 DIAGNOSIS — Z87891 Personal history of nicotine dependence: Secondary | ICD-10-CM | POA: Diagnosis not present

## 2014-04-15 DIAGNOSIS — Z8249 Family history of ischemic heart disease and other diseases of the circulatory system: Secondary | ICD-10-CM | POA: Diagnosis not present

## 2014-04-15 DIAGNOSIS — N390 Urinary tract infection, site not specified: Secondary | ICD-10-CM | POA: Diagnosis present

## 2014-04-15 DIAGNOSIS — D638 Anemia in other chronic diseases classified elsewhere: Secondary | ICD-10-CM | POA: Diagnosis present

## 2014-04-15 DIAGNOSIS — R1310 Dysphagia, unspecified: Secondary | ICD-10-CM | POA: Diagnosis present

## 2014-04-15 DIAGNOSIS — Z809 Family history of malignant neoplasm, unspecified: Secondary | ICD-10-CM | POA: Diagnosis not present

## 2014-04-15 DIAGNOSIS — Z23 Encounter for immunization: Secondary | ICD-10-CM | POA: Diagnosis not present

## 2014-04-15 DIAGNOSIS — M810 Age-related osteoporosis without current pathological fracture: Secondary | ICD-10-CM | POA: Diagnosis present

## 2014-04-15 DIAGNOSIS — N183 Chronic kidney disease, stage 3 (moderate): Secondary | ICD-10-CM | POA: Diagnosis present

## 2014-04-15 DIAGNOSIS — B9620 Unspecified Escherichia coli [E. coli] as the cause of diseases classified elsewhere: Secondary | ICD-10-CM | POA: Diagnosis present

## 2014-04-15 DIAGNOSIS — Z6820 Body mass index (BMI) 20.0-20.9, adult: Secondary | ICD-10-CM | POA: Diagnosis not present

## 2014-04-15 DIAGNOSIS — N170 Acute kidney failure with tubular necrosis: Secondary | ICD-10-CM | POA: Diagnosis present

## 2014-04-15 DIAGNOSIS — R627 Adult failure to thrive: Secondary | ICD-10-CM | POA: Diagnosis present

## 2014-04-15 DIAGNOSIS — R630 Anorexia: Secondary | ICD-10-CM | POA: Diagnosis present

## 2014-04-15 DIAGNOSIS — R51 Headache: Secondary | ICD-10-CM | POA: Diagnosis present

## 2014-04-15 DIAGNOSIS — E860 Dehydration: Secondary | ICD-10-CM | POA: Diagnosis present

## 2014-04-15 DIAGNOSIS — D759 Disease of blood and blood-forming organs, unspecified: Secondary | ICD-10-CM | POA: Diagnosis not present

## 2014-04-15 DIAGNOSIS — N179 Acute kidney failure, unspecified: Secondary | ICD-10-CM | POA: Diagnosis present

## 2014-04-15 DIAGNOSIS — Z7901 Long term (current) use of anticoagulants: Secondary | ICD-10-CM | POA: Diagnosis not present

## 2014-04-15 DIAGNOSIS — N189 Chronic kidney disease, unspecified: Secondary | ICD-10-CM | POA: Diagnosis present

## 2014-04-15 DIAGNOSIS — Z7952 Long term (current) use of systemic steroids: Secondary | ICD-10-CM | POA: Diagnosis not present

## 2014-04-15 DIAGNOSIS — D721 Eosinophilia: Secondary | ICD-10-CM | POA: Diagnosis present

## 2014-04-15 LAB — COMPREHENSIVE METABOLIC PANEL
ALBUMIN: 2.7 g/dL — AB (ref 3.5–5.2)
ALK PHOS: 48 U/L (ref 39–117)
ALK PHOS: 50 U/L (ref 39–117)
ALT: 14 U/L (ref 0–35)
ALT: 15 U/L (ref 0–35)
ANION GAP: 9 (ref 5–15)
AST: 21 U/L (ref 0–37)
AST: 27 U/L (ref 0–37)
Albumin: 2.6 g/dL — ABNORMAL LOW (ref 3.5–5.2)
Anion gap: 11 (ref 5–15)
BUN: 49 mg/dL — ABNORMAL HIGH (ref 6–23)
BUN: 48 mg/dL — ABNORMAL HIGH (ref 6–23)
CHLORIDE: 101 meq/L (ref 96–112)
CHLORIDE: 99 meq/L (ref 96–112)
CO2: 32 mEq/L (ref 19–32)
CO2: 28 meq/L (ref 19–32)
CREATININE: 1.63 mg/dL — AB (ref 0.50–1.10)
Calcium: >15 mg/dL (ref 8.4–10.5)
Calcium: 13.4 mg/dL (ref 8.4–10.5)
Creatinine, Ser: 1.74 mg/dL — ABNORMAL HIGH (ref 0.50–1.10)
GFR calc Af Amer: 33 mL/min — ABNORMAL LOW (ref >90)
GFR calc Af Amer: 31 mL/min — ABNORMAL LOW (ref >90)
GFR calc non Af Amer: 27 mL/min — ABNORMAL LOW (ref >90)
GFR, EST NON AFRICAN AMERICAN: 29 mL/min — AB (ref >90)
GLUCOSE: 105 mg/dL — AB (ref 70–99)
Glucose, Bld: 182 mg/dL — ABNORMAL HIGH (ref 70–99)
Potassium: 4 mEq/L (ref 3.7–5.3)
Potassium: 4.2 mEq/L (ref 3.7–5.3)
SODIUM: 138 meq/L (ref 137–147)
Sodium: 142 mEq/L (ref 137–147)
TOTAL PROTEIN: 6.2 g/dL (ref 6.0–8.3)
Total Bilirubin: 0.5 mg/dL (ref 0.3–1.2)
Total Bilirubin: 0.4 mg/dL (ref 0.3–1.2)
Total Protein: 6.2 g/dL (ref 6.0–8.3)

## 2014-04-15 LAB — CBC
HCT: 26 % — ABNORMAL LOW (ref 36.0–46.0)
HEMOGLOBIN: 8.4 g/dL — AB (ref 12.0–15.0)
MCH: 29.2 pg (ref 26.0–34.0)
MCHC: 32.3 g/dL (ref 30.0–36.0)
MCV: 90.3 fL (ref 78.0–100.0)
Platelets: 470 10*3/uL — ABNORMAL HIGH (ref 150–400)
RBC: 2.88 MIL/uL — ABNORMAL LOW (ref 3.87–5.11)
RDW: 17.2 % — AB (ref 11.5–15.5)
WBC: 2.4 10*3/uL — ABNORMAL LOW (ref 4.0–10.5)

## 2014-04-15 LAB — VITAMIN B12: VITAMIN B 12: 1150 pg/mL — AB (ref 211–911)

## 2014-04-15 LAB — FERRITIN: FERRITIN: 443 ng/mL — AB (ref 10–291)

## 2014-04-15 LAB — FOLATE: Folate: >20 ng/mL

## 2014-04-15 LAB — RETICULOCYTES
RBC.: 2.88 MIL/uL — AB (ref 3.87–5.11)
RETIC COUNT ABSOLUTE: 20.2 10*3/uL (ref 19.0–186.0)
Retic Ct Pct: 0.7 % (ref 0.4–3.1)

## 2014-04-15 MED ORDER — ONDANSETRON HCL 4 MG/2ML IJ SOLN: 4.0000 mg | Freq: Four times a day (QID) | INTRAMUSCULAR | Status: DC | PRN

## 2014-04-15 MED ORDER — SODIUM CHLORIDE 0.9 % IV SOLN
INTRAVENOUS | Status: AC
  Administered 2014-04-16 (×2): via INTRAVENOUS

## 2014-04-15 MED ORDER — CALCITONIN (SALMON) 200 UNIT/ML IJ SOLN
200.0000 [IU] | Freq: Once | INTRAMUSCULAR | Status: AC
  Administered 2014-04-15: 200 [IU] via INTRAMUSCULAR
  Filled 2014-04-15: qty 1

## 2014-04-15 MED ORDER — FOLIC ACID 1 MG PO TABS
1.0000 mg | ORAL_TABLET | Freq: Every day | ORAL | Status: DC
  Administered 2014-04-15 – 2014-04-27 (×13): 1 mg via ORAL
  Filled 2014-04-15 (×13): qty 1

## 2014-04-15 MED ORDER — ACETAMINOPHEN 650 MG RE SUPP
650.0000 mg | Freq: Four times a day (QID) | RECTAL | Status: DC | PRN
  Filled 2014-04-15: qty 1

## 2014-04-15 MED ORDER — SODIUM CHLORIDE 0.9 % IV SOLN
Freq: Once | INTRAVENOUS | Status: AC
  Administered 2014-04-15: 11:00:00 via INTRAVENOUS

## 2014-04-15 MED ORDER — ACETAMINOPHEN 325 MG PO TABS
650.0000 mg | ORAL_TABLET | Freq: Four times a day (QID) | ORAL | Status: DC | PRN
  Administered 2014-04-16 – 2014-04-22 (×8): 650 mg via ORAL
  Filled 2014-04-15 (×8): qty 2

## 2014-04-15 MED ORDER — IRBESARTAN 300 MG PO TABS
300.0000 mg | ORAL_TABLET | Freq: Every day | ORAL | Status: DC
  Administered 2014-04-15: 300 mg via ORAL
  Filled 2014-04-15: qty 1

## 2014-04-15 MED ORDER — OXYBUTYNIN CHLORIDE ER 10 MG PO TB24
10.0000 mg | ORAL_TABLET | Freq: Every day | ORAL | Status: DC
  Administered 2014-04-15 – 2014-04-27 (×13): 10 mg via ORAL
  Filled 2014-04-15 (×13): qty 1

## 2014-04-15 MED ORDER — CALCITONIN (SALMON) 200 UNIT/ML IJ SOLN: 50.0000 [IU] | Freq: Once | INTRAMUSCULAR | Status: DC

## 2014-04-15 MED ORDER — RIVAROXABAN 15 MG PO TABS
15.0000 mg | ORAL_TABLET | Freq: Every day | ORAL | Status: DC
  Administered 2014-04-15 – 2014-04-16 (×2): 15 mg via ORAL
  Filled 2014-04-15 (×3): qty 1

## 2014-04-15 MED ORDER — CETYLPYRIDINIUM CHLORIDE 0.05 % MT LIQD
7.0000 mL | Freq: Two times a day (BID) | OROMUCOSAL | Status: DC
  Administered 2014-04-15 – 2014-04-27 (×20): 7 mL via OROMUCOSAL

## 2014-04-15 MED ORDER — ONDANSETRON HCL 4 MG PO TABS: 4.0000 mg | ORAL_TABLET | Freq: Four times a day (QID) | ORAL | Status: DC | PRN

## 2014-04-15 MED ORDER — SIMVASTATIN 20 MG PO TABS
20.0000 mg | ORAL_TABLET | Freq: Every evening | ORAL | Status: DC
  Administered 2014-04-15 – 2014-04-26 (×11): 20 mg via ORAL
  Filled 2014-04-15 (×13): qty 1

## 2014-04-15 MED ORDER — HYDROCODONE-ACETAMINOPHEN 5-325 MG PO TABS
1.0000 | ORAL_TABLET | ORAL | Status: DC | PRN
  Administered 2014-04-22: 1 via ORAL
  Filled 2014-04-15: qty 1

## 2014-04-15 MED ORDER — DEXTROSE 5 % IV SOLN
1.0000 g | INTRAVENOUS | Status: DC
  Administered 2014-04-16 – 2014-04-18 (×3): 1 g via INTRAVENOUS
  Filled 2014-04-15 (×4): qty 10

## 2014-04-15 MED ORDER — DOCUSATE SODIUM 100 MG PO CAPS
100.0000 mg | ORAL_CAPSULE | Freq: Two times a day (BID) | ORAL | Status: DC
  Administered 2014-04-15 – 2014-04-20 (×11): 100 mg via ORAL
  Filled 2014-04-15 (×13): qty 1

## 2014-04-15 NOTE — ED Notes (Signed)
Admitting MD at bedside.

## 2014-04-15 NOTE — Evaluation (Signed)
Physical Therapy Evaluation Patient Details Name: Zoe Robinson MRN: [id number] DOB: 1932/02/16 Today's Date: 04/15/2014   History of Present Illness  Zoe Robinson is a 80 y.o. Caucasian female with history of hypertension, atrial fibrillation on chronic anticoagulation, rheumatoid arthritis, chronic kidney disease stage III, and osteoporosis on calcium and vitamin D who presents with generalized weakness; She was found to have a urinary tract infection, hypercalcemic, and acute renal failure  Clinical Impression   Pt admitted with above. Pt currently with functional limitations due to the deficits listed below (see PT Problem List).  Pt will benefit from skilled PT to increase their independence and safety with mobility to allow discharge to the venue listed below.   Overall moving well, anticipate good progress as medical course progresses; I favor dc'ing home to familiar environment and routine; Will need clearer picture of home situation for more definitive recommendations.      Follow Up Recommendations Home health PT;Supervision/Assistance - 24 hour    Equipment Recommendations  Rolling walker with 5" wheels;3in1 (PT) (may already have)    Recommendations for Other Services OT consult     Precautions / Restrictions Precautions Precautions: Fall Restrictions Weight Bearing Restrictions: No      Mobility  Bed Mobility Overal bed mobility: Needs Assistance Bed Mobility: Supine to Sit     Supine to sit: Min assist     General bed mobility comments: Overall moved well coming to EOB; needing min assist to get to fully upright sitting  Transfers Overall transfer level: Needs assistance Equipment used: Rolling walker (2 wheeled) Transfers: Sit to/from Stand Sit to Stand: Mod assist         General transfer comment: Mod anti-gravity assist to rise; Cues for safety and hand placement  Ambulation/Gait Ambulation/Gait assistance: Min guard Ambulation Distance  (Feet): 5 Feet Assistive device: Rolling walker (2 wheeled) Gait Pattern/deviations: Step-through pattern;Decreased stride length     General Gait Details: Amb short distance due to urine incontinence  Stairs            Wheelchair Mobility    Modified Rankin (Stroke Patients Only)       Balance Overall balance assessment: Needs assistance Sitting-balance support: Single extremity supported;Feet supported Sitting balance-Leahy Scale: Fair     Standing balance support: Single extremity supported Standing balance-Leahy Scale: Fair                               Pertinent Vitals/Pain Pain Assessment: No/denies pain    Home Living Family/patient expects to be discharged to:: Private residence Living Arrangements: Spouse/significant other Available Help at Discharge: Family;Available 24 hours/day (This needs to be verified with family) Type of Home: House Home Access: Stairs to enter   [employer] of Steps: 2-3 (Needs to be verified) Home Layout: Two level;Able to live on main level with bedroom/bathroom Home Equipment: Dan Humphreys - 2 wheels Additional Comments: Will need to verify home information with a family memeber    Prior Function Level of Independence: Independent with assistive device(s)               Hand Dominance        Extremity/Trunk Assessment   Upper Extremity Assessment: Overall WFL for tasks assessed           Lower Extremity Assessment: Generalized weakness         Communication   Communication: Expressive difficulties (slow to answer; difficult to understand; didn't talk much)  Cognition Arousal/Alertness:  Lethargic (just woke up; more alert as session progressed) Behavior During Therapy: WFL for tasks assessed/performed Overall Cognitive Status: No family/caregiver present to determine baseline cognitive functioning                      General Comments General comments (skin integrity, edema,  etc.): Incoontinent of urine    Exercises        Assessment/Plan    PT Assessment Patient needs continued PT services  PT Diagnosis Generalized weakness   PT Problem List Decreased strength;Decreased activity tolerance;Decreased balance;Decreased mobility;Decreased cognition;Decreased knowledge of use of DME  PT Treatment Interventions DME instruction;Gait training;Stair training;Functional mobility training;Therapeutic activities;Therapeutic exercise;Patient/family education   PT Goals (Current goals can be found in the Care Plan section) Acute Rehab PT Goals Patient Stated Goal: Agreeable to getting OOB PT Goal Formulation: Patient unable to participate in goal setting Time For Goal Achievement: 04/29/14 Potential to Achieve Goals: Good    Frequency Min 3X/week   Barriers to discharge   Need clearer picture of home situation and available home assist    Co-evaluation               End of Session   Activity Tolerance: Patient tolerated treatment well Patient left: in chair;with call bell/phone within reach;with chair alarm set Nurse Communication: Mobility status         Time: 1031-5945 PT Time Calculation (min): 25 min   Charges:   PT Evaluation $Initial PT Evaluation Tier I: 1 Procedure PT Treatments $Gait Training: 8-22 mins   PT G Codes:          Olen Pel 04/15/2014, 10:40 AM  Van Clines, PT  Acute Rehabilitation Services Pager [phone] Office [phone]'

## 2014-04-15 NOTE — ED Provider Notes (Signed)
I saw and evaluated the patient, reviewed the resident's note and I agree with the findings and plan.   EKG Interpretation None      Patient be admitted the hospital for acute kidney injury as well as hypercalcemia.  Additional fluids will be given.  She follows simple commands.  No significant altered mental status.  Lyanne Co, MD 04/15/14 [phone]

## 2014-04-15 NOTE — Evaluation (Signed)
Occupational Therapy Evaluation Patient Details Name: Zoe Robinson MRN: [id number] DOB: 28-Jul-1931 Today's Date: 04/15/2014    History of Present Illness Zoe Robinson is a 80 y.o. Caucasian female with history of hypertension, atrial fibrillation on chronic anticoagulation, rheumatoid arthritis, chronic kidney disease stage III, and osteoporosis on calcium and vitamin D who presents with generalized weakness; She was found to have a urinary tract infection, hypercalcemic, and acute renal failure   Clinical Impression   Prior to admission, pt could care for herself and perform light meal prep and housekeeping per her family.  Her daughter has noticed that she has had more difficulty managing her medication and has not been eating or drinking well.  She had been considering getting more help at home for her mom.  Pt presents with impaired cognition, generalized weakness, and decreased balance interfering with ability to perform self care.  Pt has urinary incontinence.  Discussed pros and cons of rehab in a SNF vs home with her family if they can provide 24 hour supervision.  Family wants to wait and see how pt progresses.  Will follow acutely.    Follow Up Recommendations  Home health OT;Supervision/Assistance - 24 hour (will need SNF if family cannot provide 24 hour care)    Equipment Recommendations       Recommendations for Other Services       Precautions / Restrictions Precautions Precautions: Fall Restrictions Weight Bearing Restrictions: No      Mobility Bed Mobility               General bed mobility comments: pt up in chair  Transfers Overall transfer level: Needs assistance   Transfers: Sit to/from Stand Sit to Stand: Min assist         General transfer comment: verbal cues for hand placement on arms of chair and use of grab bar    Balance     Sitting balance-Leahy Scale: Fair       Standing balance-Leahy Scale: Fair                               ADL Overall ADL's : Needs assistance/impaired Eating/Feeding: Minimal assistance;Sitting (stops eating unless supervised and encouraged to continue)   Grooming: Wash/dry hands;Minimal assistance;Sitting (decreased sequencing and thoroughness)   Upper Body Bathing: Minimal assitance;Sitting   Lower Body Bathing: Moderate assistance;Sit to/from stand   Upper Body Dressing : Minimal assistance;Sitting Upper Body Dressing Details (indicate cue type and reason): changed damp gown Lower Body Dressing: Moderate assistance;Sit to/from stand   Toilet Transfer: Minimal assistance;Ambulation;Regular Teacher, adult education Details (indicate cue type and reason): pt with urinary incontinence Toileting- Clothing Manipulation and Hygiene: Minimal assistance;Sitting/lateral lean Toileting - Clothing Manipulation Details (indicate cue type and reason): pt perseverative with wiping     Functional mobility during ADLs: Minimal assistance (hand held)       Vision                     Perception     Praxis      Pertinent Vitals/Pain Pain Assessment: No/denies pain     Hand Dominance Right   Extremity/Trunk Assessment Upper Extremity Assessment Upper Extremity Assessment: Generalized weakness   Lower Extremity Assessment Lower Extremity Assessment: Defer to PT evaluation       Communication Communication Communication: Expressive difficulties   Cognition Arousal/Alertness: Awake/alert Behavior During Therapy: WFL for tasks assessed/performed Overall Cognitive Status: Impaired/Different from baseline Area of Impairment:  Memory;Safety/judgement;Awareness;Problem solving     Memory: Decreased short-term memory   Safety/Judgement: Decreased awareness of safety;Decreased awareness of deficits Awareness: Emergent Problem Solving: Slow processing;Requires verbal cues;Requires tactile cues;Difficulty sequencing General Comments: Daughter reports pt has been  confusing her meds and does not eat or drink much at all.  Daughter was concerned with pt staying by herself prior to admission.     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 daughter) Type of Home: House Home Access: Stairs to enter [employer] of Steps: 2-3   Home Layout: Two level;Able to live on main level with bedroom/bathroom     Bathroom Shower/Tub: Chief Strategy Officer: Standard     Home Equipment: Walker - 2 wheels          Prior Functioning/Environment Level of Independence: Independent        Comments: was not using walker at home    OT Diagnosis: Generalized weakness;Cognitive deficits   OT Problem List: Decreased strength;Decreased activity tolerance;Impaired balance (sitting and/or standing);Decreased cognition;Decreased safety awareness;Decreased knowledge of use of DME or AE   OT Treatment/Interventions: Self-care/ADL training;DME and/or AE instruction;Balance training;Patient/family education;Cognitive remediation/compensation    OT Goals(Current goals can be found in the care plan section) Acute Rehab OT Goals Patient Stated Goal: return to prior functioning level OT Goal Formulation: With patient Time For Goal Achievement: 04/29/14 Potential to Achieve Goals: Good ADL Goals Pt Will Perform Grooming: with supervision;standing Pt Will Perform Upper Body Bathing: with supervision;sitting Pt Will Perform Lower Body Bathing: with supervision;sit to/from stand Pt Will Perform Upper Body Dressing: with supervision;sitting Pt Will Perform Lower Body Dressing: with supervision;sit to/from stand Pt Will Transfer to Toilet: with supervision;ambulating;regular height  toilet Pt Will Perform Toileting - Clothing Manipulation and hygiene: with supervision;sit to/from stand  OT Frequency: Min 2X/week   Barriers to D/C:            Co-evaluation              End of Session Nurse Communication:  (urinary incontinence, family wishes to speak to her)  Activity Tolerance: Patient tolerated treatment well Patient left: in chair;with call bell/phone within reach;with family/visitor present;with chair alarm set   Time: 1350-1420 OT Time Calculation (min): 30 min Charges:  OT General Charges $OT Visit: 1 Procedure OT Evaluation $Initial OT Evaluation Tier I: 1 Procedure OT Treatments $Self Care/Home Management : 8-22 mins G-Codes:    Evern Bio 04/15/2014, 3:15 PM [phone]

## 2014-04-15 NOTE — Progress Notes (Signed)
MEDICATION RELATED CONSULT NOTE - INITIAL   Pharmacy Consult for calcitonin Indication: hypercalcemia  No Known Allergies  Patient Measurements: Height: 4\' 10"  (147.3 cm) Weight: 95 lb 14.4 oz (43.5 kg) IBW/kg (Calculated) : 40.9 Adjusted Body Weight: n/a  Vital Signs: Temp: 98.2 F (36.8 C) (09/21 2027) Temp src: Axillary (09/21 2027) BP: 153/54 mmHg (09/21 2027) Pulse Rate: 80 (09/21 2027) Intake/Output from previous day: 09/20 0701 - 09/21 0700 In: 50 [I.V.:50] Out: 210 [Urine:210] Intake/Output from this shift:    Labs:  Recent Labs  04/14/14 2230 04/15/14 0430 04/15/14 1818  WBC 2.8* 2.4*  --   HGB 9.3* 8.4*  --   HCT 28.0* 26.0*  --   PLT 502* 470*  --   APTT 35  --   --   CREATININE 1.62* 1.63* 1.74*  ALBUMIN  --  2.7* 2.6*  PROT  --  6.2 6.2  AST  --  21 27  ALT  --  14 15  ALKPHOS  --  48 50  BILITOT  --  0.5 0.4   Estimated Creatinine Clearance: 16.9 ml/min (by C-G formula based on Cr of 1.74).   Microbiology: No results found for this or any previous visit (from the past 720 hour(s)).  Medical History: Past Medical History  Diagnosis Date  . Atrial fibrillation   . Arthritis   . Hypertension   . CKD (chronic kidney disease)   . RA (rheumatoid arthritis)     Assessment: 80 yo female admitted with hypercalcemia, ARF.  Received one dose of calcitonin this AM for calcium level > 15.  This evening's calcium level down to 13.4, but given albumin = 2.6, calcium corrects to 14.5.  Goal of Therapy:  Normalize calcium level.  Plan:  Repeat calcitonin dose.  Typical dose is 4 units/kg = 175 units.  Will round to nearest vial size of 200 units.    Will check repeat Calcium level with AM labs.  Consider addition of a bisphosphonate (pamindronate or zolendronic acid)?  76, BCPS  Clinical Pharmacist Pager [phone]  04/15/2014 8:55 PM

## 2014-04-15 NOTE — Progress Notes (Signed)
Patient admitted after midnight- please see H&P. Urinary tract infection  -ceftriaxone -urine culture   Hypercalcemia  Unclear etiology. Discontinue patient's home calcium with vitamin D.  -PTH and PTH related peptide, vitamin D. 25, and vitamin D 1-25.  -Aggressively hydrate the patient on IV fluids with normal saline and 150 cc per hour for the next 2 days.  -given calcitonin x 1 dose 9/21 - If calcium does not improve with hydration by morning consider starting the patient on calcitonin. If calcium does not improve in 2-4 days with aggressive hydration may consider starting the patient on bisphosphonate therapy. May consider renal consultation if no clear etiology for hypercalcemia could be found.   Acute renal failure on top of chronic kidney disease stage III  Likely due to dehydration from poor oral intake and from hypercalcemia. Aggressively hydrate the patient on IV fluids and reassess renal function and volume status daily.   Generalized weakness  Likely due to above. Requests PT and OT evaluation.   Dehydration  Management as indicated above. Aggressively hydrate the patient on IV fluids.   Rheumatoid arthritis  Resume home medications once verified by pharmacy.   Atrial fibrillation  Rate controlled. On xarelto  Anemia  Likely due to chronic disease.   Hypertension  Continue home antihypertensive medication, if renal function worsens discontinue ARB.   Prophylaxis  Anticoagulated on Rivaroxaban.  Marlin Canary DO

## 2014-04-15 NOTE — H&P (Addendum)
Patient's PCP: Lindwood Qua, MD  Chief Complaint: Weakness  History of Present Illness: Zoe Robinson is a 80 y.o. Caucasian female with history of hypertension, atrial fibrillation on chronic anticoagulation, rheumatoid arthritis, chronic kidney disease stage III, and osteoporosis on calcium and vitamin D who presents with the above complaints.  Most of the history was obtained by daughter at bedside.  Daughter noted that patient has been feeling gradually weaker over the last week.  She has had poor appetite and decreased oral intake at home.  About a week ago she had a small nosebleed which resolved spontaneously.  Patient also had intermittent episodes of headaches most recently 3 days ago.  Today she has been feeling nauseated and had one episode of dry heaving.  Due to patient failing to thrive at home, she was brought to the emergency department for further evaluation.  She was found to have a urinary tract infection, hypercalcemic, and acute renal failure.  Hospitalist service was asked to admit the patient for further care and management.  She has not had any fevers but reports having chills at home.  Denies any chest pain, shortness of breath, abdominal pain, diarrhea, or vision changes.  Review of Systems: All systems reviewed with the patient and positive as per history of present illness, otherwise all other systems are negative.  Past Medical History  Diagnosis Date  . Atrial fibrillation   . Arthritis   . Hypertension   . CKD (chronic kidney disease)   . RA (rheumatoid arthritis)    Past Surgical History  Procedure Laterality Date  . Abdominal hysterectomy     Family History  Problem Relation Age of Onset  . Heart attack Mother   . Heart attack Father   . Heart attack Brother   . Heart attack Brother   . Stomach cancer Brother    History   Social History  . Marital Status: Married    Spouse Name: N/A    Number of Children: N/A  . Years of Education: N/A    Occupational History  . Not on file.   Social History Main Topics  . Smoking status: Former Smoker    Quit date: 04/15/1964  . Smokeless tobacco: Not on file  . Alcohol Use: No  . Drug Use: No  . Sexual Activity: Not on file   Other Topics Concern  . Not on file   Social History Narrative  . No narrative on file   Allergies: Review of patient's allergies indicates no known allergies.  Home Meds: Prior to Admission medications   Medication Sig Start Date End Date Taking? Authorizing Provider  Calcium Carb-Cholecalciferol (CALCIUM 600 + D PO) Take 1 tablet by mouth daily.    Historical Provider, MD  ciprofloxacin (CIPRO) 500 MG tablet Take 1 tablet (500 mg total) by mouth 2 (two) times daily. 05/18/13   Arie Sabina Schinlever, PA-C  docusate sodium (COLACE) 100 MG capsule Take 100 mg by mouth 2 (two) times daily.    Historical Provider, MD  folic acid (FOLVITE) 1 MG tablet Take 1 mg by mouth daily.    Historical Provider, MD  HYDROcodone-acetaminophen (NORCO/VICODIN) 5-325 MG per tablet Take 1 tablet by mouth every 4 (four) hours as needed for pain.    Historical Provider, MD  magnesium gluconate (MAGONATE) 500 MG tablet Take 500 mg by mouth daily.    Historical Provider, MD  methotrexate (RHEUMATREX) 2.5 MG tablet Take 7.5 mg by mouth every Wednesday. Caution:Chemotherapy. Protect from light.    Historical Provider,  MD  metroNIDAZOLE (FLAGYL) 500 MG tablet Take 1 tablet (500 mg total) by mouth 2 (two) times daily. 05/18/13   Arie Sabina Schinlever, PA-C  oxybutynin (DITROPAN-XL) 10 MG 24 hr tablet Take 10 mg by mouth daily.    Historical Provider, MD  predniSONE (DELTASONE) 1 MG tablet Take 1 mg by mouth 2 (two) times daily.    Historical Provider, MD  predniSONE (DELTASONE) 5 MG tablet Take 2.5 mg by mouth daily.    Historical Provider, MD  risedronate (ACTONEL) 35 MG tablet Take 35 mg by mouth every Tuesday. with water on empty stomach, nothing by mouth or lie down for next 30  minutes.    Historical Provider, MD  Rivaroxaban (XARELTO) 15 MG TABS tablet Take 15 mg by mouth daily.    Historical Provider, MD  simvastatin (ZOCOR) 40 MG tablet Take 20 mg by mouth every evening.    Historical Provider, MD  valsartan (DIOVAN) 320 MG tablet Take 320 mg by mouth daily.    Historical Provider, MD  vitamin E 1000 UNIT capsule Take 1,000 Units by mouth daily.    Historical Provider, MD    Physical Exam: Blood pressure 182/61, pulse 77, temperature 97.4 F (36.3 C), temperature source Oral, resp. rate 16, SpO2 96.00%. General: Awake, Oriented x3, No acute distress. HEENT: EOMI, dry mucous membranes Neck: Supple CV: S1 and S2 Lungs: Clear to ascultation bilaterally Abdomen: Soft, Nontender, Nondistended, +bowel sounds. Ext: Good pulses. Trace edema. No clubbing or cyanosis noted. Neuro: Cranial Nerves II-XII grossly intact. Has 5/5 motor strength in upper and lower extremities.  Lab results:  Recent Labs  04/14/14 2230  NA 135*  K 4.5  CL 92*  CO2 33*  GLUCOSE 121*  BUN 52*  CREATININE 1.62*  CALCIUM >15.0*   No results found for this basename: AST, ALT, ALKPHOS, BILITOT, PROT, ALBUMIN,  in the last 72 hours No results found for this basename: LIPASE, AMYLASE,  in the last 72 hours  Recent Labs  04/14/14 2230  WBC 2.8*  HGB 9.3*  HCT 28.0*  MCV 90.9  PLT 502*   No results found for this basename: CKTOTAL, CKMB, CKMBINDEX, TROPONINI,  in the last 72 hours No components found with this basename: POCBNP,  No results found for this basename: DDIMER,  in the last 72 hours No results found for this basename: HGBA1C,  in the last 72 hours No results found for this basename: CHOL, HDL, LDLCALC, TRIG, CHOLHDL, LDLDIRECT,  in the last 72 hours No results found for this basename: TSH, T4TOTAL, FREET3, T3FREE, THYROIDAB,  in the last 72 hours No results found for this basename: VITAMINB12, FOLATE, FERRITIN, TIBC, IRON, RETICCTPCT,  in the last 72 hours Imaging  results:  Dg Chest 2 View  04/15/2014   CLINICAL DATA:  Weakness for 1 week, headache, jaundice  EXAM: CHEST  2 VIEW  COMPARISON:  None.  FINDINGS: Mild cardiac enlargement. Vascular pattern normal. No consolidation or effusion. Severe L1 compression deformity, age uncertain.  IMPRESSION: Mild cardiac enlargement.  L1 compression deformity.   Electronically Signed   By: Esperanza Heir M.D.   On: 04/15/2014 00:19   Other results: EKG: Sinus PACs heart rate 65.  Assessment & Plan by Problem: Urinary tract infection Continue ceftriaxone which was started in the emergency department.  Further management of antibiotics depending on urine culture results and sensitivities.  Hypercalcemia Unclear etiology.  Discontinue patient's home calcium with vitamin D.  Check patient's PTH and PTH related peptide, vitamin D.  25, and vitamin D 1-25.  Aggressively hydrate the patient on IV fluids with normal saline and 150 cc per hour for the next 2 days.  Check patient's albumin in the morning to correct for any changes in calcium from albumin.  If calcium does not improve with hydration by morning consider starting the patient on calcitonin.  If calcium does not improve in 2-4 days with aggressive hydration may consider starting the patient on bisphosphonate therapy.  May consider renal consultation if no clear etiology for hypercalcemia could be found.  Acute renal failure on top of chronic kidney disease stage III Likely due to dehydration from poor oral intake and from hypercalcemia.  Aggressively hydrate the patient on IV fluids and reassess renal function and volume status daily.  Generalized weakness Likely due to above.  Requests PT and OT evaluation.  Dehydration Management as indicated above.  Aggressively hydrate the patient on IV fluids.  Rheumatoid arthritis Resume home medications once verified by pharmacy.  Atrial fibrillation Rate controlled.  Anticoagulated on Coumadin which will be  continued.  Anemia Likely due to chronic disease.  Check anemia panel in the morning.  Hypertension Continue home antihypertensive medication, if renal function worsens discontinue ARB.  Medication reconciliation Not completely verified by pharmacy.  Medication list needs to be reevaluated in the morning.  Prophylaxis Anticoagulated on Rivaroxaban.  CODE STATUS Full code.  Discussed with family and patient at the time of admission.  Disposition Admit the patient to medical bed as inpatient.  Time spent on admission, talking to the patient, and coordinating care was: 50 mins.  Orval Dortch A, MD 04/15/2014, 1:05 AM

## 2014-04-15 NOTE — ED Notes (Signed)
Transporting patient to new room assignment. 

## 2014-04-15 NOTE — ED Notes (Signed)
MD at bedside. 

## 2014-04-16 DIAGNOSIS — N179 Acute kidney failure, unspecified: Secondary | ICD-10-CM

## 2014-04-16 DIAGNOSIS — E860 Dehydration: Secondary | ICD-10-CM

## 2014-04-16 LAB — CBC
HCT: 25.5 % — ABNORMAL LOW (ref 36.0–46.0)
HEMOGLOBIN: 8.4 g/dL — AB (ref 12.0–15.0)
MCH: 29.4 pg (ref 26.0–34.0)
MCHC: 32.9 g/dL (ref 30.0–36.0)
MCV: 89.2 fL (ref 78.0–100.0)
Platelets: 381 10*3/uL (ref 150–400)
RBC: 2.86 MIL/uL — ABNORMAL LOW (ref 3.87–5.11)
RDW: 17.1 % — ABNORMAL HIGH (ref 11.5–15.5)
WBC: 1.5 10*3/uL — ABNORMAL LOW (ref 4.0–10.5)

## 2014-04-16 LAB — COMPREHENSIVE METABOLIC PANEL
ALT: 14 U/L (ref 0–35)
ANION GAP: 11 (ref 5–15)
AST: 23 U/L (ref 0–37)
Albumin: 2.6 g/dL — ABNORMAL LOW (ref 3.5–5.2)
Alkaline Phosphatase: 47 U/L (ref 39–117)
BUN: 45 mg/dL — AB (ref 6–23)
CALCIUM: 12.2 mg/dL — AB (ref 8.4–10.5)
CO2: 28 mEq/L (ref 19–32)
Chloride: 104 mEq/L (ref 96–112)
Creatinine, Ser: 1.9 mg/dL — ABNORMAL HIGH (ref 0.50–1.10)
GFR calc non Af Amer: 24 mL/min — ABNORMAL LOW (ref >90)
GFR, EST AFRICAN AMERICAN: 28 mL/min — AB (ref >90)
GLUCOSE: 152 mg/dL — AB (ref 70–99)
Potassium: 3.6 mEq/L — ABNORMAL LOW (ref 3.7–5.3)
SODIUM: 143 meq/L (ref 137–147)
Total Bilirubin: 0.3 mg/dL (ref 0.3–1.2)
Total Protein: 6.1 g/dL (ref 6.0–8.3)

## 2014-04-16 LAB — IRON AND TIBC
Iron: 51 ug/dL (ref 42–135)
Saturation Ratios: 23 % (ref 20–55)
TIBC: 225 ug/dL — AB (ref 250–470)
UIBC: 174 ug/dL (ref 125–400)

## 2014-04-16 LAB — VITAMIN D 25 HYDROXY (VIT D DEFICIENCY, FRACTURES): Vit D, 25-Hydroxy: 62 ng/mL (ref 30–89)

## 2014-04-16 LAB — PARATHYROID HORMONE, INTACT (NO CA): PTH: 8 pg/mL — ABNORMAL LOW (ref 14–64)

## 2014-04-16 MED ORDER — PREDNISONE 5 MG PO TABS
5.0000 mg | ORAL_TABLET | ORAL | Status: DC
  Administered 2014-04-17 – 2014-04-27 (×6): 5 mg via ORAL
  Filled 2014-04-16 (×7): qty 1

## 2014-04-16 MED ORDER — INFLUENZA VAC SPLIT QUAD 0.5 ML IM SUSY
0.5000 mL | PREFILLED_SYRINGE | INTRAMUSCULAR | Status: DC
  Filled 2014-04-16: qty 0.5

## 2014-04-16 MED ORDER — PNEUMOCOCCAL VAC POLYVALENT 25 MCG/0.5ML IJ INJ
0.5000 mL | INJECTION | INTRAMUSCULAR | Status: AC
  Administered 2014-04-17: 0.5 mL via INTRAMUSCULAR
  Filled 2014-04-16: qty 0.5

## 2014-04-16 MED ORDER — CALCITONIN (SALMON) 200 UNIT/ML IJ SOLN
200.0000 [IU] | Freq: Two times a day (BID) | INTRAMUSCULAR | Status: AC
  Administered 2014-04-16 (×2): 200 [IU] via INTRAMUSCULAR
  Filled 2014-04-16 (×3): qty 1

## 2014-04-16 MED ORDER — PREDNISONE 5 MG PO TABS
10.0000 mg | ORAL_TABLET | ORAL | Status: DC
  Administered 2014-04-16 – 2014-04-26 (×6): 10 mg via ORAL
  Filled 2014-04-16 (×8): qty 2

## 2014-04-16 NOTE — Progress Notes (Signed)
Physical Therapy Treatment Patient Details Name: Zoe Robinson MRN: [id number] DOB: [birth year] Today's Date: 04/16/2014    History of Present Illness Zoe Robinson is a 80 y.o. Caucasian female with history of hypertension, atrial fibrillation on chronic anticoagulation, rheumatoid arthritis, chronic kidney disease stage III, and osteoporosis on calcium and vitamin D who presents with generalized weakness; She was found to have a urinary tract infection, hypercalcemic, and acute renal failure    PT Comments    Daughter present for this session, so was abl eto get a better picture of home situation and assist; Son and daughter live near pt and husband and check in on them regularly; Pt's husband can give her supervision but it seemed from Zoe Robinson, pt's daughter that husband may not be able to give a lot of physical assist;  Pt clearly wants to dc home, and given her improvement btwn yesterday and today, this is reasonable   Follow Up Recommendations  Home health PT;Supervision/Assistance - 24 hour HHRN     Equipment Recommendations  Rolling walker with 5" wheels;3in1 (PT) (may already have)    Recommendations for Other Services       Precautions / Restrictions Precautions Precautions: Fall    Mobility  Bed Mobility Overal bed mobility: Needs Assistance Bed Mobility: Supine to Sit     Supine to sit: Supervision     General bed mobility comments: Used rails; slow moving; cues to reciprocal scoot to EOB  Transfers Overall transfer level: Needs assistance Equipment used: Rolling walker (2 wheeled) Transfers: Sit to/from Stand Sit to Stand: Min assist         General transfer comment: Cues for hand placement and safety  Ambulation/Gait Ambulation/Gait assistance: Min assist Ambulation Distance (Feet): 100 Feet Assistive device: Rolling walker (2 wheeled) Gait Pattern/deviations: Step-through pattern;Decreased stride length;Trunk flexed Gait velocity: slowed    General Gait Details: Required assist to keep RW on straight path, tended to go right with RW   Stairs            Wheelchair Mobility    Modified Rankin (Stroke Patients Only)       Balance     Sitting balance-Leahy Scale: Fair       Standing balance-Leahy Scale: Fair                      Cognition Arousal/Alertness: Awake/alert Behavior During Therapy: WFL for tasks assessed/performed Overall Cognitive Status: Impaired/Different from baseline Area of Impairment: Memory;Safety/judgement;Awareness;Problem solving     Memory: Decreased short-term memory   Safety/Judgement: Decreased awareness of safety;Decreased awareness of deficits Awareness: Emergent Problem Solving: Slow processing;Requires verbal cues;Requires tactile cues;Difficulty sequencing General Comments: Daughter reports pt has been confusing her meds and does not eat or drink much at all.  Daughter was concerned with pt staying by herself prior to admission.      Exercises      General Comments        Pertinent Vitals/Pain Pain Assessment: No/denies pain    Home Living Family/patient expects to be discharged to:: Private residence Living Arrangements: Spouse/significant other                  Prior Function            PT Goals (current goals can now be found in the care plan section) Acute Rehab PT Goals Patient Stated Goal: return to prior functioning level PT Goal Formulation: With patient Time For Goal Achievement: 04/29/14 Potential to Achieve Goals: Good Progress towards PT  goals: Progressing toward goals    Frequency  Min 3X/week    PT Plan Current plan remains appropriate    Co-evaluation             End of Session   Activity Tolerance: Patient tolerated treatment well Patient left: in chair;with call bell/phone within reach;with chair alarm set     Time: 1550-1615 PT Time Calculation (min): 25 min  Charges:  $Gait Training: 23-37 mins                     G Codes:      Olen Pel 04/16/2014, 4:56 PM  Van Clines, [place]  Acute Rehabilitation Services Pager [phone] Office [phone]

## 2014-04-16 NOTE — Progress Notes (Signed)
MEDICATION RELATED CONSULT NOTE - FOLLOW UP   Pharmacy Consult for Calcitonin Indication: Hypercalcemia  No Known Allergies  Patient Measurements: Height: 4\' 10"  (147.3 cm) Weight: 95 lb 14.4 oz (43.5 kg) IBW/kg (Calculated) : 40.9 Adjusted Body Weight:   Vital Signs: Temp: 98 F (36.7 C) (09/22 0437) Temp src: Oral (09/22 0437) BP: 140/72 mmHg (09/22 0437) Pulse Rate: 77 (09/22 0437) Intake/Output from previous day: 09/21 0701 - 09/22 0700 In: 600 [I.V.:600] Out: -  Intake/Output from this shift:    Labs:  Recent Labs  04/14/14 2230 04/15/14 0430 04/15/14 1818 04/16/14 0510  WBC 2.8* 2.4*  --  1.5*  HGB 9.3* 8.4*  --  8.4*  HCT 28.0* 26.0*  --  25.5*  PLT 502* 470*  --  381  APTT 35  --   --   --   CREATININE 1.62* 1.63* 1.74* 1.90*  ALBUMIN  --  2.7* 2.6* 2.6*  PROT  --  6.2 6.2 6.1  AST  --  21 27 23   ALT  --  14 15 14   ALKPHOS  --  48 50 47  BILITOT  --  0.5 0.4 0.3   Estimated Creatinine Clearance: 15.5 ml/min (by C-G formula based on Cr of 1.9).   Microbiology: Recent Results (from the past 720 hour(s))  URINE CULTURE     Status: None   Collection Time    04/14/14 11:09 PM      Result Value Ref Range Status   Specimen Description URINE, CATHETERIZED   Final   Special Requests CX ADDED AT 0037 ON   Final   Culture  Setup Time     Final   Value: 04/15/2014 01:09     Performed at 04/16/14   Colony Count     Final   Value: >=100,000 COLONIES/ML     Performed at [id number]   Culture     Final   Value: ESCHERICHIA COLI     Performed at 04/17/2014   Report Status PENDING   Incomplete    Assessment: 80 yo female admitted with hypercalcemia, ARF. Corrected calcium levels have decreased to 13.3 (Ca 12.2, albumin 2.6) with hydration and Calcitonin 200 units x 2. Per discussion with Dr. [employer] [facility], will continue Calcitonin 24 more hours as expected efficacy is limited to 48hrs. MD is holding off on bisphosphonate  therapy as renal function is continuing to worsen.  - Wt 44kg  Goal of Therapy:  Normalization of calcium levels  Plan:  1. Calcitonin 200units (~4 units/kg) IM q12hr x 2 doses 2. Follow-up calcium level tomorrow AM (daily CMET ordered) 3. Cont Xarelto 15 mg daily with supper - watch with renal fxn 4. Follow-up UCx and Ceftriaxone narrowing  [employer] [facility] 76 04/16/2014,10:08 AM

## 2014-04-16 NOTE — Progress Notes (Signed)
UR completed 

## 2014-04-16 NOTE — Progress Notes (Signed)
PROGRESS NOTE  Zoe Robinson JQB:[id number] DOB: [birth year] DOA: 04/14/2014 PCP: Lindwood Qua, MD  Assessment/Plan: Urinary tract infection  -ceftriaxone  -urine culture   Hypercalcemia  Unclear etiology. Discontinue patient's home calcium with vitamin D.  -PTH and PTH related peptide, vitamin D. 25, and vitamin D 1-25.  -Aggressively hydrate the patient on IV fluids with normal saline and 150 cc per hour for the next 2 days.  -given calcitonin x 3 doses  - May consider renal consultation if no clear etiology for hypercalcemia could be found.   Acute renal failure on top of chronic kidney disease stage III  Likely due to dehydration from poor oral intake and from hypercalcemia. Aggressively hydrate the patient on IV fluids and reassess renal function and volume status daily.   Generalized weakness  Likely due to above. Requests PT and OT evaluation.   Dehydration  Management as indicated above. Aggressively hydrate the patient on IV fluids.   Rheumatoid arthritis  D/c methotrexate  Neutropenia ?infection and methotrexate  Atrial fibrillation  Rate controlled. On xarelto   Anemia  Likely due to chronic disease.   Hypertension  monitor   Code Status: full Family Communication: patient Disposition Plan:    Consultants:    Procedures:      HPI/Subjective: Doing better this AM More alert per daughter at bedside  Objective: Filed Vitals:   04/16/14 0900  BP: 142/56  Pulse: 79  Temp: 98.4 F (36.9 C)  Resp: 17    Intake/Output Summary (Last 24 hours) at 04/16/14 1301 Last data filed at 04/16/14 1022  Gross per 24 hour  Intake    120 ml  Output      0 ml  Net    120 ml   Filed Weights   04/15/14 0202 04/15/14 2027  Weight: 42.411 kg (93 lb 8 oz) 43.5 kg (95 lb 14.4 oz)    Exam:   General:  A+Ox 3  Cardiovascular: rrr  Respiratory: clear anterior  Abdomen: +BS, soft  Musculoskeletal: no edema   Data Reviewed: Basic Metabolic  Panel:  Recent Labs Lab 04/14/14 2230 04/15/14 0430 04/15/14 1818 04/16/14 0510  NA 135* 142 138 143  K 4.5 4.0 4.2 3.6*  CL 92* 101 99 104  CO2 33* 32 28 28  GLUCOSE 121* 105* 182* 152*  BUN 52* 49* 48* 45*  CREATININE 1.62* 1.63* 1.74* 1.90*  CALCIUM >15.0* >15.0* 13.4* 12.2*   Liver Function Tests:  Recent Labs Lab 04/15/14 0430 04/15/14 1818 04/16/14 0510  AST 21 27 23   ALT 14 15 14   ALKPHOS 48 50 47  BILITOT 0.5 0.4 0.3  PROT 6.2 6.2 6.1  ALBUMIN 2.7* 2.6* 2.6*   No results found for this basename: LIPASE, AMYLASE,  in the last 168 hours No results found for this basename: AMMONIA,  in the last 168 hours CBC:  Recent Labs Lab 04/14/14 2230 04/15/14 0430 04/16/14 0510  WBC 2.8* 2.4* 1.5*  HGB 9.3* 8.4* 8.4*  HCT 28.0* 26.0* 25.5*  MCV 90.9 90.3 89.2  PLT 502* 470* 381   Cardiac Enzymes: No results found for this basename: CKTOTAL, CKMB, CKMBINDEX, TROPONINI,  in the last 168 hours BNP (last 3 results) No results found for this basename: PROBNP,  in the last 8760 hours CBG: No results found for this basename: GLUCAP,  in the last 168 hours  Recent Results (from the past 240 hour(s))  URINE CULTURE     Status: None   Collection Time    04/14/14 11:09  PM      Result Value Ref Range Status   Specimen Description URINE, CATHETERIZED   Final   Special Requests CX ADDED AT 0037 ON [id number]   Final   Culture  Setup Time     Final   Value: 04/15/2014 01:09     Performed at [facility]   Colony Count     Final   Value: >=100,000 COLONIES/ML     Performed at [facility]   Culture     Final   Value: ESCHERICHIA COLI     Performed at [facility]   Report Status PENDING   Incomplete     Studies: Dg Chest 2 View  04/15/2014   CLINICAL DATA:  Weakness for 1 week, headache, jaundice  EXAM: CHEST  2 VIEW  COMPARISON:  None.  FINDINGS: Mild cardiac enlargement. Vascular pattern normal. No consolidation or effusion. Severe L1  compression deformity, age uncertain.  IMPRESSION: Mild cardiac enlargement.  L1 compression deformity.   Electronically Signed   By: Esperanza Heir M.D.   On: 04/15/2014 00:19    Scheduled Meds: . antiseptic oral rinse  7 mL Mouth Rinse BID  . calcitonin  200 Units Intramuscular Q12H  . cefTRIAXone (ROCEPHIN)  IV  1 g Intravenous Q24H  . docusate sodium  100 mg Oral BID  . folic acid  1 mg Oral Daily  . oxybutynin  10 mg Oral Daily  . predniSONE  10 mg Oral QODAY  . [START ON 04/17/2014] predniSONE  5 mg Oral QODAY  . Rivaroxaban  15 mg Oral Q supper  . simvastatin  20 mg Oral QPM   Continuous Infusions: . sodium chloride 150 mL/hr at 04/16/14 1038   Antibiotics Given (last 72 hours)   Date/Time Action Medication Dose Rate   04/16/14 0039 Given   cefTRIAXone (ROCEPHIN) 1 g in dextrose 5 % 50 mL IVPB 1 g 100 mL/hr      Principal Problem:   Generalized weakness Active Problems:   Hypercalcemia   Acute renal failure   CKD (chronic kidney disease)   RA (rheumatoid arthritis)   Hypertension   Atrial fibrillation   Dehydration   UTI (urinary tract infection)   Anemia    Time spent: 35 min    Indalecio Malmstrom  [facility] Pager [phone]. If 7PM-7AM, please contact night-coverage at [email or web address], password [facility] 04/16/2014, 1:01 PM  LOS: 2 days

## 2014-04-17 ENCOUNTER — Inpatient Hospital Stay (HOSPITAL_COMMUNITY): Payer: Medicare Other

## 2014-04-17 DIAGNOSIS — N170 Acute kidney failure with tubular necrosis: Secondary | ICD-10-CM | POA: Diagnosis not present

## 2014-04-17 DIAGNOSIS — I10 Essential (primary) hypertension: Secondary | ICD-10-CM

## 2014-04-17 LAB — URINE CULTURE

## 2014-04-17 LAB — COMPREHENSIVE METABOLIC PANEL
ALBUMIN: 2.6 g/dL — AB (ref 3.5–5.2)
ALK PHOS: 44 U/L (ref 39–117)
ALT: 14 U/L (ref 0–35)
AST: 23 U/L (ref 0–37)
Anion gap: 12 (ref 5–15)
BILIRUBIN TOTAL: 0.2 mg/dL — AB (ref 0.3–1.2)
BUN: 42 mg/dL — ABNORMAL HIGH (ref 6–23)
CHLORIDE: 105 meq/L (ref 96–112)
CO2: 27 mEq/L (ref 19–32)
Calcium: 10.4 mg/dL (ref 8.4–10.5)
Creatinine, Ser: 2.03 mg/dL — ABNORMAL HIGH (ref 0.50–1.10)
GFR calc Af Amer: 26 mL/min — ABNORMAL LOW (ref >90)
GFR calc non Af Amer: 22 mL/min — ABNORMAL LOW (ref >90)
Glucose, Bld: 119 mg/dL — ABNORMAL HIGH (ref 70–99)
POTASSIUM: 3.3 meq/L — AB (ref 3.7–5.3)
Sodium: 144 mEq/L (ref 137–147)
Total Protein: 6 g/dL (ref 6.0–8.3)

## 2014-04-17 LAB — CBC
HEMATOCRIT: 24.4 % — AB (ref 36.0–46.0)
Hemoglobin: 8.2 g/dL — ABNORMAL LOW (ref 12.0–15.0)
MCH: 30.7 pg (ref 26.0–34.0)
MCHC: 33.6 g/dL (ref 30.0–36.0)
MCV: 91.4 fL (ref 78.0–100.0)
Platelets: 262 10*3/uL (ref 150–400)
RBC: 2.67 MIL/uL — ABNORMAL LOW (ref 3.87–5.11)
RDW: 17.1 % — ABNORMAL HIGH (ref 11.5–15.5)
WBC: 2.4 10*3/uL — ABNORMAL LOW (ref 4.0–10.5)

## 2014-04-17 LAB — TSH: TSH: 0.795 u[IU]/mL (ref 0.350–4.500)

## 2014-04-17 MED ORDER — POTASSIUM CHLORIDE CRYS ER 20 MEQ PO TBCR
40.0000 meq | EXTENDED_RELEASE_TABLET | Freq: Once | ORAL | Status: AC
  Administered 2014-04-17: 40 meq via ORAL
  Filled 2014-04-17: qty 2

## 2014-04-17 MED ORDER — FUROSEMIDE 40 MG PO TABS
40.0000 mg | ORAL_TABLET | Freq: Once | ORAL | Status: AC
  Administered 2014-04-17: 40 mg via ORAL
  Filled 2014-04-17: qty 1

## 2014-04-17 MED ORDER — ENOXAPARIN SODIUM 40 MG/0.4ML ~~LOC~~ SOLN
40.0000 mg | SUBCUTANEOUS | Status: DC
  Administered 2014-04-17 – 2014-04-21 (×5): 40 mg via SUBCUTANEOUS
  Filled 2014-04-17 (×6): qty 0.4

## 2014-04-17 MED ORDER — INFLUENZA VAC SPLIT QUAD 0.5 ML IM SUSY
0.5000 mL | PREFILLED_SYRINGE | INTRAMUSCULAR | Status: AC
  Administered 2014-04-19: 0.5 mL via INTRAMUSCULAR
  Filled 2014-04-17: qty 0.5

## 2014-04-17 NOTE — Progress Notes (Signed)
ANTICOAGULATION CONSULT NOTE - Follow Up Consult  Pharmacy Consult for Lovenox Indication: atrial fibrillation  No Known Allergies  Patient Measurements: Height: 4\' 10"  (147.3 cm) Weight: 95 lb 14.4 oz (43.5 kg) IBW/kg (Calculated) : 40.9  Vital Signs: Temp: 98.5 F (36.9 C) (09/23 1100) Temp src: Oral (09/23 1100) BP: 157/68 mmHg (09/23 1100) Pulse Rate: 71 (09/23 1100)  Labs:  Recent Labs  04/14/14 2230 04/15/14 0430 04/15/14 1818 04/16/14 0510 04/17/14 0750  HGB 9.3* 8.4*  --  8.4* 8.2*  HCT 28.0* 26.0*  --  25.5* 24.4*  PLT 502* 470*  --  381 262  APTT 35  --   --   --   --   CREATININE 1.62* 1.63* 1.74* 1.90* 2.03*    Estimated Creatinine Clearance: 14.5 ml/min (by C-G formula based on Cr of 2.03).   Assessment: 49 YOF who presented on 9/21 with symptomatic hypercalcemia and acute on chronic kidney disease. The patient was noted to be on Xarelto PTA for hx Afib however use is now contraindicated given the patient's worsening renal function, SCr 2.03, CrCl~10-15 ml/min. Pharmacy has now been consulted to transition the patient to lovenox while awaiting renal recovery - the renal team has been consulted today. Will plan to give the first dose of lovenox ~24 hours after the last dose of Xarelto was given.  Goal of Therapy:  Anti-Xa level 0.6-1 units/ml 4hrs after LMWH dose given Monitor platelets by anticoagulation protocol: Yes   Plan:  1. Lovenox 40 mg SQ every 24 hours 2. Will continue to monitor for any signs/symptoms of bleeding and renal function for dose adjustments for lovenox and appropriateness to transition back to Xarelto  10/21, PharmD, BCPS Clinical Pharmacist Pager: [phone] 04/17/2014 12:56 PM

## 2014-04-17 NOTE — Progress Notes (Addendum)
PROGRESS NOTE  Zoe Robinson QPY:[id number] DOB: [birth year] DOA: 04/14/2014 PCP: Lindwood Qua, MD  Assessment/Plan: Urinary tract infection  -ceftriaxone  -urine culture   Hypercalcemia  Unclear etiology. Discontinue patient's home calcium with vitamin D.  -PTH LOW and PTH related peptide, vitamin D. 25 ok, and vitamin D 1-25- others pending  -given calcitonin x 3 doses  - renal consultation   Acute renal failure - ? CKD- baseline in our system appears to be 0.8  IVF has not improved Cr -renal consult -renal u/s   Generalized weakness  PT and OT evaluation.  Rheumatoid arthritis  D/c methotrexate  Neutropenia ?infection and methotrexate  Atrial fibrillation  Rate controlled. On xarelto   Anemia  Likely due to chronic disease.   Hypertension  monitor  Hypokalemia -replete  Code Status: full Family Communication: patient/daughter 9/22 Disposition Plan:    Consultants:    Procedures:      HPI/Subjective: No SOB, no CP Unable to control urination  Objective: Filed Vitals:   04/17/14 0531  BP: 180/65  Pulse: 72  Temp: 98.7 F (37.1 C)  Resp: 17    Intake/Output Summary (Last 24 hours) at 04/17/14 0949 Last data filed at 04/16/14 1929  Gross per 24 hour  Intake 1221.25 ml  Output      0 ml  Net 1221.25 ml   Filed Weights   04/15/14 0202 04/15/14 2027  Weight: 42.411 kg (93 lb 8 oz) 43.5 kg (95 lb 14.4 oz)    Exam:   General:  A+Ox 3  Cardiovascular: rrr  Respiratory: clear anterior  Abdomen: +BS, soft  Musculoskeletal: no edema   Data Reviewed: Basic Metabolic Panel:  Recent Labs Lab 04/14/14 2230 04/15/14 0430 04/15/14 1818 04/16/14 0510 04/17/14 0750  NA 135* 142 138 143 144  K 4.5 4.0 4.2 3.6* 3.3*  CL 92* 101 99 104 105  CO2 33* 32 28 28 27   GLUCOSE 121* 105* 182* 152* 119*  BUN 52* 49* 48* 45* 42*  CREATININE 1.62* 1.63* 1.74* 1.90* 2.03*  CALCIUM >15.0* >15.0* 13.4* 12.2* 10.4   Liver Function  Tests:  Recent Labs Lab 04/15/14 0430 04/15/14 1818 04/16/14 0510 04/17/14 0750  AST 21 27 23 23   ALT 14 15 14 14   ALKPHOS 48 50 47 44  BILITOT 0.5 0.4 0.3 0.2*  PROT 6.2 6.2 6.1 6.0  ALBUMIN 2.7* 2.6* 2.6* 2.6*   No results found for this basename: LIPASE, AMYLASE,  in the last 168 hours No results found for this basename: AMMONIA,  in the last 168 hours CBC:  Recent Labs Lab 04/14/14 2230 04/15/14 0430 04/16/14 0510 04/17/14 0750  WBC 2.8* 2.4* 1.5* 2.4*  HGB 9.3* 8.4* 8.4* 8.2*  HCT 28.0* 26.0* 25.5* 24.4*  MCV 90.9 90.3 89.2 91.4  PLT 502* 470* 381 262   Cardiac Enzymes: No results found for this basename: CKTOTAL, CKMB, CKMBINDEX, TROPONINI,  in the last 168 hours BNP (last 3 results) No results found for this basename: PROBNP,  in the last 8760 hours CBG: No results found for this basename: GLUCAP,  in the last 168 hours  Recent Results (from the past 240 hour(s))  URINE CULTURE     Status: None   Collection Time    04/14/14 11:09 PM      Result Value Ref Range Status   Specimen Description URINE, CATHETERIZED   Final   Special Requests CX ADDED AT 0037 ON 04/18/14   Final   Culture  Setup Time  Final   Value: 04/15/2014 01:09     Performed at [employer] [facility] Count     Final   Value: >=100,000 COLONIES/ML     Performed at [facility]   Culture     Final   Value: ESCHERICHIA COLI     Performed at [facility]   Report Status PENDING   Incomplete     Studies: No results found.  Scheduled Meds: . antiseptic oral rinse  7 mL Mouth Rinse BID  . cefTRIAXone (ROCEPHIN)  IV  1 g Intravenous Q24H  . docusate sodium  100 mg Oral BID  . folic acid  1 mg Oral Daily  . furosemide  40 mg Oral Once  . Influenza vac split quadrivalent PF  0.5 mL Intramuscular Tomorrow-1000  . oxybutynin  10 mg Oral Daily  . pneumococcal 23 valent vaccine  0.5 mL Intramuscular Tomorrow-1000  . potassium chloride  40 mEq Oral Once  . predniSONE   10 mg Oral QODAY  . predniSONE  5 mg Oral QODAY  . Rivaroxaban  15 mg Oral Q supper  . simvastatin  20 mg Oral QPM   Continuous Infusions:   Antibiotics Given (last 72 hours)   Date/Time Action Medication Dose Rate   04/16/14 0039 Given   cefTRIAXone (ROCEPHIN) 1 g in dextrose 5 % 50 mL IVPB 1 g 100 mL/hr   04/17/14 0126 Given   cefTRIAXone (ROCEPHIN) 1 g in dextrose 5 % 50 mL IVPB 1 g 100 mL/hr      Principal Problem:   Generalized weakness Active Problems:   Hypercalcemia   Acute renal failure   CKD (chronic kidney disease)   RA (rheumatoid arthritis)   Hypertension   Atrial fibrillation   Dehydration   UTI (urinary tract infection)   Anemia    Time spent: 25 min    Zoe Robinson  [facility] Pager [phone]. If 7PM-7AM, please contact night-coverage at [email or web address], password [facility] 04/17/2014, 9:49 AM  LOS: 3 days

## 2014-04-18 DIAGNOSIS — R5383 Other fatigue: Secondary | ICD-10-CM

## 2014-04-18 DIAGNOSIS — R5381 Other malaise: Secondary | ICD-10-CM

## 2014-04-18 LAB — COMPREHENSIVE METABOLIC PANEL
ALBUMIN: 3 g/dL — AB (ref 3.5–5.2)
ALK PHOS: 51 U/L (ref 39–117)
ALT: 21 U/L (ref 0–35)
ANION GAP: 14 (ref 5–15)
AST: 32 U/L (ref 0–37)
BILIRUBIN TOTAL: 0.2 mg/dL — AB (ref 0.3–1.2)
BUN: 44 mg/dL — AB (ref 6–23)
CO2: 25 meq/L (ref 19–32)
CREATININE: 2.08 mg/dL — AB (ref 0.50–1.10)
Calcium: 11.2 mg/dL — ABNORMAL HIGH (ref 8.4–10.5)
Chloride: 100 mEq/L (ref 96–112)
GFR calc Af Amer: 25 mL/min — ABNORMAL LOW (ref >90)
GFR calc non Af Amer: 22 mL/min — ABNORMAL LOW (ref >90)
Glucose, Bld: 97 mg/dL (ref 70–99)
POTASSIUM: 3.6 meq/L — AB (ref 3.7–5.3)
Sodium: 139 mEq/L (ref 137–147)
Total Protein: 7 g/dL (ref 6.0–8.3)

## 2014-04-18 LAB — URINALYSIS, ROUTINE W REFLEX MICROSCOPIC
Bilirubin Urine: NEGATIVE
GLUCOSE, UA: NEGATIVE mg/dL
Ketones, ur: NEGATIVE mg/dL
Leukocytes, UA: NEGATIVE
Nitrite: NEGATIVE
PROTEIN: NEGATIVE mg/dL
SPECIFIC GRAVITY, URINE: 1.012 (ref 1.005–1.030)
Urobilinogen, UA: 0.2 mg/dL (ref 0.0–1.0)
pH: 6 (ref 5.0–8.0)

## 2014-04-18 LAB — CBC
HCT: 26.7 % — ABNORMAL LOW (ref 36.0–46.0)
Hemoglobin: 8.8 g/dL — ABNORMAL LOW (ref 12.0–15.0)
MCH: 30.1 pg (ref 26.0–34.0)
MCHC: 33 g/dL (ref 30.0–36.0)
MCV: 91.4 fL (ref 78.0–100.0)
Platelets: 231 10*3/uL (ref 150–400)
RBC: 2.92 MIL/uL — ABNORMAL LOW (ref 3.87–5.11)
RDW: 17.3 % — AB (ref 11.5–15.5)
WBC: 2 10*3/uL — ABNORMAL LOW (ref 4.0–10.5)

## 2014-04-18 LAB — URINE MICROSCOPIC-ADD ON

## 2014-04-18 LAB — MAGNESIUM: Magnesium: 1.4 mg/dL — ABNORMAL LOW (ref 1.5–2.5)

## 2014-04-18 LAB — PHOSPHORUS: PHOSPHORUS: 2.6 mg/dL (ref 2.3–4.6)

## 2014-04-18 LAB — CREATININE, URINE, RANDOM: CREATININE, URINE: 44.08 mg/dL

## 2014-04-18 LAB — VITAMIN D 1,25 DIHYDROXY
Vitamin D 1, 25 (OH)2 Total: 108 pg/mL — ABNORMAL HIGH (ref 18–72)
Vitamin D2 1, 25 (OH)2: <8 pg/mL
Vitamin D3 1, 25 (OH)2: 108 pg/mL

## 2014-04-18 LAB — CALCIUM, URINE, RANDOM: Calcium, Ur: 7 mg/dL

## 2014-04-18 LAB — SODIUM, URINE, RANDOM: SODIUM UR: 80 meq/L

## 2014-04-18 MED ORDER — SODIUM CHLORIDE 0.9 % IV SOLN
INTRAVENOUS | Status: DC
  Administered 2014-04-19: 100 mL/h via INTRAVENOUS
  Administered 2014-04-19 – 2014-04-21 (×3): via INTRAVENOUS

## 2014-04-18 MED ORDER — CEPHALEXIN 500 MG PO CAPS
500.0000 mg | ORAL_CAPSULE | Freq: Two times a day (BID) | ORAL | Status: DC
  Administered 2014-04-18 – 2014-04-20 (×5): 500 mg via ORAL
  Filled 2014-04-18 (×9): qty 1

## 2014-04-18 MED ORDER — MAGNESIUM SULFATE 40 MG/ML IJ SOLN
2.0000 g | Freq: Once | INTRAMUSCULAR | Status: DC
  Filled 2014-04-18: qty 50

## 2014-04-18 NOTE — Consult Note (Signed)
Reason for Consult: Hypercalcemia and AKI on CKD Stage II Referring Physician: Dr. Eliseo Squires HPI: Zoe Robinson is an 80 y.o. female with past medical history of hypertension, atrial fibrillation on xarelto, rheumatoid arthritis on methotrexate and prednisone, CKD Stage II, chronic anemia on oral iron replacement, and osteoporosis on biyearly prolia and oral calcium/vitamin D replacement who we are consulted for in regards to hypercalcemia and AKI.    She was admitted on 9/20 for generalized weakness/fatigue, nausea, and very poor PO intake/appetite (with decreased PO intake at baseline) for 1 week duration. She also had urinary symptoms and was found to have E. Coli UTI (treated with 5 days of ceftriaxone/keflex). She had hypercalcemia (>15), and AKI (Cr 1.62) on admission.   Her last corrected calcium level was elevated at 11 on 05/17/13. On admission her uncorrected calcium was >15. She was given NS 200 mL/hr on 9/21 and and then NS 75 mL/hr on 9/22. She was also given 2 doses of calcitonin on 9/21 and 9/22 with improvement of levels to 12 today. Her PTH level was low at 8 with normal  25-OH vitamin d (62), D3 (108) and D2 levels (<8). Her vitamin D 1,25 (OH) level was elevated at 108.  She has a history of osteoporosis (diangosed on DEXA scan Jan 2015) and is on prolia (denosumab) biyearly injections (received in August 2014 and Feb 2015) and also on oral calcium carbonate 1200 mg daily/vitamin D supplementation with L1 compression fracture on 05/17/13 after a fall. She has history of RA and is on chronic prednisone 5-10 mg daily and methotrexate 7.5-10 mg biweekly. Her WBC has markedly dropped from 7.5K on 05/18/13 to 2K today. She has no history of prior malignancy. There is a family history og gastric cancer in her brother and lung/renal cell carcinoma in her living 31 yr old sister.  Chest xray on admisison did not reveal evidence of mass. CT abdomen/pelvis on 05/17/13 did not reveal evidence of  obstructing lesion. She has had prior colonoscopy which has been normal. She reports chronic dysphagia with solids and hoarseness of her voice with occasional cough. She has never had EGD in the past. She denies night sweating, tender/enlarged lymph nodes, or bleeding.  She has a history of CKD Stage 2 with last GFR of 77 and Cr. 0.8 on 05/17/13. UA on 9/20 revealed trace hematuria (RBC 3-6) and no protein. Renal US on 04/17/14 was normal with 9.2 cm right kidney and 8.9 cm left kidney with no evidence of obstructive uropathy. Her Cr has continued to worsen since hospitalization and currently at 2.08 with GFR of 22. She is on valsartan 320 mg daily at home which was given 1 dose that was then discontinued. She denies NSAID use at home or recent IV contrast. She is also on xarelto for atrial fibrillation. She has had 1.4 L urine output since yesterday. She denies urinary symptoms and currently has a foley catheter in place.   Today she reports improvement in weakness and appetite since admission. She believes she has lost weight (normally 102-7 lbs).   PMH:   Past Medical History  Diagnosis Date  . Atrial fibrillation   . Arthritis   . Hypertension   . CKD (chronic kidney disease)   . RA (rheumatoid arthritis)     PSH:   Past Surgical History  Procedure Laterality Date  . Abdominal hysterectomy      Allergies: No Known Allergies  Medications:   Prior to Admission medications   Medication Sig Start  Date End Date Taking? Authorizing Provider  acetaminophen (TYLENOL) 500 MG tablet Take 1,000 mg by mouth every 8 (eight) hours.   Yes Historical Provider, MD  Calcium Carb-Cholecalciferol (CALCIUM 600 + D PO) Take 2 tablets by mouth daily.    Yes Historical Provider, MD  ferrous sulfate 325 (65 FE) MG tablet Take 325 mg by mouth daily with breakfast.   Yes Historical Provider, MD  folic acid (FOLVITE) 1 MG tablet Take 1 mg by mouth daily.   Yes Historical Provider, MD  magnesium gluconate  (MAGONATE) 500 MG tablet Take 500 mg by mouth daily.   Yes Historical Provider, MD  methotrexate (RHEUMATREX) 2.5 MG tablet Take 7.5-10 mg by mouth 2 (two) times a week. Take 10 mg by mouth on Thursday and take 7.5 mg by mouth on Friday.   Yes Historical Provider, MD  Multiple Vitamins-Minerals (MULTIVITAMIN PO) Take 1 tablet by mouth daily.   Yes Historical Provider, MD  oxybutynin (DITROPAN-XL) 10 MG 24 hr tablet Take 10 mg by mouth daily.   Yes Historical Provider, MD  predniSONE (DELTASONE) 5 MG tablet Take 5-10 mg by mouth every other day. Take 5 mg by mouth every other day alternating with 10 mg by mouth every other day.   Yes Historical Provider, MD  Rivaroxaban (XARELTO) 15 MG TABS tablet Take 15 mg by mouth daily.   Yes Historical Provider, MD  simvastatin (ZOCOR) 40 MG tablet Take 20 mg by mouth every evening.   Yes Historical Provider, MD  valsartan (DIOVAN) 320 MG tablet Take 320 mg by mouth daily.   Yes Historical Provider, MD  vitamin E 1000 UNIT capsule Take 1,000 Units by mouth daily.   Yes Historical Provider, MD    Discontinued Meds:   Medications Discontinued During This Encounter  Medication Reason  . calcitonin (MIACALCIN) injection 50 Units   . irbesartan (AVAPRO) tablet 300 mg Discontinued by provider  . predniSONE (DELTASONE) 1 MG tablet Patient has not taken in last 30 days  . ciprofloxacin (CIPRO) 500 MG tablet Completed Course  . HYDROcodone-acetaminophen (NORCO/VICODIN) 5-325 MG per tablet No longer needed (for PRN medications)  . metroNIDAZOLE (FLAGYL) 500 MG tablet Completed Course  . risedronate (ACTONEL) 35 MG tablet Patient has not taken in last 30 days  . docusate sodium (COLACE) 100 MG capsule Patient has not taken in last 30 days  . Rivaroxaban (XARELTO) tablet 15 mg Discontinued by provider  . Influenza vac split quadrivalent PF (FLUARIX) injection 0.5 mL   . cefTRIAXone (ROCEPHIN) 1 g in dextrose 5 % 50 mL IVPB       Family History:   Family History   Problem Relation Age of Onset  . Heart attack Mother   . Heart attack Father   . Heart attack Brother   . Heart attack Brother   . Stomach cancer Brother     Social History:  reports that she quit smoking about 50 years ago. She does not have any smokeless tobacco history on file. She reports that she does not drink alcohol or use illicit drugs. Review of Systems  Constitutional: Positive for chills (at home), weight loss and malaise/fatigue. Negative for fever.  HENT:       Voice hoarseness  Respiratory: Positive for cough (occasionally). Negative for sputum production and shortness of breath.   Cardiovascular: Negative for chest pain and leg swelling.  Gastrointestinal: Positive for constipation. Negative for nausea, vomiting, abdominal pain and diarrhea.       Dysphagia   Genitourinary:  Negative for dysuria (Resolved), urgency and frequency.  Neurological: Positive for weakness and headaches (at home). Negative for focal weakness.    Blood pressure 143/48, pulse 75, temperature 98.2 F (36.8 C), temperature source Oral, resp. rate 19, height 4' 10"  (1.473 m), weight 96 lb (43.545 kg), SpO2 94.00%.  Physical Exam  Constitutional: She is oriented to person, place, and time. No distress.  Very thin and frail appearing    HENT:  Head: Normocephalic and atraumatic.  Mouth/Throat: Oropharynx is clear and moist. No oropharyngeal exudate.  Eyes: EOM are normal.  Neck: Normal range of motion. Neck supple.  Cardiovascular: Normal rate.   Respiratory: Effort normal. No respiratory distress. She has no wheezes. She has no rales.  Poor air flow  GI: Soft. Bowel sounds are normal. She exhibits no distension. There is no tenderness. There is no rebound and no guarding.  Genitourinary:  Foley catheter in place  Musculoskeletal: Normal range of motion. She exhibits no edema and no tenderness.  Neurological: She is alert and oriented to person, place, and time.  Skin: Skin is warm and dry.  No rash noted. She is not diaphoretic. No erythema. No pallor.  Psychiatric: She has a normal mood and affect. Her behavior is normal. Judgment and thought content normal.    Creatinine, Ser  Date/Time Value Ref Range Status  04/18/2014  7:50 AM 2.08* 0.50 - 1.10 mg/dL Final  04/17/2014  7:50 AM 2.03* 0.50 - 1.10 mg/dL Final  04/16/2014  5:10 AM 1.90* 0.50 - 1.10 mg/dL Final  04/15/2014  6:18 PM 1.74* 0.50 - 1.10 mg/dL Final  04/15/2014  4:30 AM 1.63* 0.50 - 1.10 mg/dL Final  04/14/2014 10:30 PM 1.62* 0.50 - 1.10 mg/dL Final  05/17/2013  8:20 PM 0.79  0.50 - 1.10 mg/dL Final    Results for orders placed during the hospital encounter of 04/14/14 (from the past 48 hour(s))  COMPREHENSIVE METABOLIC PANEL     Status: Abnormal   Collection Time    04/17/14  7:50 AM      Result Value Ref Range   Sodium 144  137 - 147 mEq/L   Potassium 3.3 (*) 3.7 - 5.3 mEq/L   Chloride 105  96 - 112 mEq/L   CO2 27  19 - 32 mEq/L   Glucose, Bld 119 (*) 70 - 99 mg/dL   BUN 42 (*) 6 - 23 mg/dL   Creatinine, Ser 2.03 (*) 0.50 - 1.10 mg/dL   Calcium 10.4  8.4 - 10.5 mg/dL   Total Protein 6.0  6.0 - 8.3 g/dL   Albumin 2.6 (*) 3.5 - 5.2 g/dL   AST 23  0 - 37 U/L   ALT 14  0 - 35 U/L   Alkaline Phosphatase 44  39 - 117 U/L   Total Bilirubin 0.2 (*) 0.3 - 1.2 mg/dL   GFR calc non Af Amer 22 (*) >90 mL/min   GFR calc Af Amer 26 (*) >90 mL/min   Comment: (NOTE)     The eGFR has been calculated using the CKD EPI equation.     This calculation has not been validated in all clinical situations.     eGFR's persistently <90 mL/min signify possible Chronic Kidney     Disease.   Anion gap 12  5 - 15  CBC     Status: Abnormal   Collection Time    04/17/14  7:50 AM      Result Value Ref Range   WBC 2.4 (*) 4.0 -  10.5 K/uL   RBC 2.67 (*) 3.87 - 5.11 MIL/uL   Hemoglobin 8.2 (*) 12.0 - 15.0 g/dL   HCT 24.4 (*) 36.0 - 46.0 %   MCV 91.4  78.0 - 100.0 fL   MCH 30.7  26.0 - 34.0 pg   MCHC 33.6  30.0 - 36.0 g/dL   RDW  17.1 (*) 11.5 - 15.5 %   Platelets 262  150 - 400 K/uL   Comment: REPEATED TO VERIFY  TSH     Status: None   Collection Time    04/17/14 10:48 AM      Result Value Ref Range   TSH 0.795  0.350 - 4.500 uIU/mL  CBC     Status: Abnormal   Collection Time    04/18/14  7:50 AM      Result Value Ref Range   WBC 2.0 (*) 4.0 - 10.5 K/uL   RBC 2.92 (*) 3.87 - 5.11 MIL/uL   Hemoglobin 8.8 (*) 12.0 - 15.0 g/dL   HCT 26.7 (*) 36.0 - 46.0 %   MCV 91.4  78.0 - 100.0 fL   MCH 30.1  26.0 - 34.0 pg   MCHC 33.0  30.0 - 36.0 g/dL   RDW 17.3 (*) 11.5 - 15.5 %   Platelets 231  150 - 400 K/uL  COMPREHENSIVE METABOLIC PANEL     Status: Abnormal   Collection Time    04/18/14  7:50 AM      Result Value Ref Range   Sodium 139  137 - 147 mEq/L   Potassium 3.6 (*) 3.7 - 5.3 mEq/L   Chloride 100  96 - 112 mEq/L   CO2 25  19 - 32 mEq/L   Glucose, Bld 97  70 - 99 mg/dL   BUN 44 (*) 6 - 23 mg/dL   Creatinine, Ser 2.08 (*) 0.50 - 1.10 mg/dL   Calcium 11.2 (*) 8.4 - 10.5 mg/dL   Total Protein 7.0  6.0 - 8.3 g/dL   Albumin 3.0 (*) 3.5 - 5.2 g/dL   AST 32  0 - 37 U/L   ALT 21  0 - 35 U/L   Alkaline Phosphatase 51  39 - 117 U/L   Total Bilirubin 0.2 (*) 0.3 - 1.2 mg/dL   GFR calc non Af Amer 22 (*) >90 mL/min   GFR calc Af Amer 25 (*) >90 mL/min   Comment: (NOTE)     The eGFR has been calculated using the CKD EPI equation.     This calculation has not been validated in all clinical situations.     eGFR's persistently <90 mL/min signify possible Chronic Kidney     Disease.   Anion gap 14  5 - 15    US Renal  04/17/2014   CLINICAL DATA:  Acute kidney injury and renal insufficiency.  EXAM: RENAL/URINARY TRACT ULTRASOUND COMPLETE  COMPARISON:  None.  FINDINGS: Right Kidney:  Length: 9.2 cm. Echogenicity within normal limits. No mass or hydronephrosis visualized.  Left Kidney:  Length: 8.9 cm. Echogenicity within normal limits. No mass or hydronephrosis visualized.  Bladder:  Appears normal for degree of  bladder distention.  IMPRESSION: Normal renal ultrasound.   Electronically Signed   By: Aletta Edouard M.D.   On: 04/17/2014 15:38   Background: Zoe Robinson is an 80 y.o. female with past medical history of hypertension, atrial fibrillation on xarelto, rheumatoid arthritis on methotrexate and prednisone, CKD Stage II, chronic anemia on oral iron replacement, and osteoporosis on oral calcium/vitamin D  replacement who we are consulted for in regards to hypercalcemia (>15 on admission) and AKI on CKD stage II (baseline 0.8 with peak of 2.08).    Plan:   Chronic Hypercalcemia - Pt has had chronically elevated calcium since at least 04/2013 with corrected calcium of 11 at that time. On admission was >15 that has improved to 12 today after IV fluids and 4 doses of calcitonin administration since hospitalization. PTH is appropriately low (no concern for primary hyperparathyroidism). Elevated Vitamin D3, 1,25(OH)2 indicates both endogenous production and supplementation however Vitamin D2, 1,25(OH)2 indicator of exogeous sources is normal suggesting endogenous production due to possible malignancy (vs RA). Continue to hold calcium/vitamin D supplementation (pt was not on high dose supplementation).  Awaiting PTHrP. Alk phos level is normal. Will obtain SPEP and kappa/:lambda to assess for multiple myeloma given anemia, renal disease, elevated total protein in 04/2013, recent infection, and compression fracture last year in setting of osteoporosis (after fall). Will also obtain phosphorus level (normal at 2.6), urine calcium, and CBC w/ diff. Would recommend starting IV bisphosphate therapy (zoledronic acid) and IV hydration (for concomitant AKI).  Non-oliguric AKI on CKD Stage II - Pt with last Cr of 0.8 on 05/17/13 with GFR 77, currently 2.08 with GFR  22. Etiology most likely pre-renal azotemia (BUN/Cr ratio>20:1) due to volume depletion in setting of hypercalcemia and decreased PO intake for past week. Renal  US normal on 9/23 and UA on admission with no proteinuria and trace hematuria (RBC 3-6). Repeat UA and obtain urine sodium/Cr. Hold home valsartan. Continue daily renal function panel monitoring, daily weight, strict I/O's, and avoidance of nephrotoxins. Would recommend IV hydration with NS.  Hypokalemia/Hypomagnesemia - K  3.3 this AM. Magnesium is low at 1.4. Replete with IV 2 magnesium sulfate. Continue to monitor.  E. Coli UTI - Currently on PO keflex 500 mg BID (total day of antibiotics 5). Hypertension - Currently mildly hypertensive. Hold home valsartan 320 mg daily. Leukopenia - WBC 2K, previously normal (7.5K) on 04/2013. Pt at home on methotrexate biweekly for RA with can cause bone marrow suppression. Malignancy also possible. Obtain CBC w/ diff. Hold home methotrexate. Consider flow cytometry.  Chronic Normocytic Anemia - Hg 8.2 today. Anemia panel with elevated ferrritin (443) and low iron sat 23%. Last Hg 11.9 on 05/17/13.  Pt at home on oral ferrous sulfate 325 mg daily.   Rheumatoid Arthritis on immunosuppressant therapy - Pt on chronic prednisone 5-10 mg daily and methotrexate 7.5-10 mg biweekly. Hold methotrexate in setting of infection. Pt currently on prednisone 5-10 daily.  Osteoporosis - Pt is on biyearly prolia (Aug 2014 and Feb 2015) and OTC calcium/vitamin D supplementation. Continue to hold supplementation in setting of hypercalcemia.  Atrial Fibrillation on AC therapy -  Pt at home on xarelto which she received on first two days of admission. She is currently on lovenox therapy. She is not on rate control therapy.     Juluis Mire, MD PGY-II IMTS  Pager: [phone]   Juluis Mire 04/18/2014, 11:52 AM   Renal Attending: Pt hx reviewed, examined and case discussed with Dr. Ronne Binning. Very interesting case of severe hypercalcemia in a female with osteoporosis, elevated total protein, anemia and leukopenia(but on MTX) and recent AKI.   M. Myeloma must be ruled out.   The role of Prolia is uncertain to me in this circumstance, but typically associated with hypocalcemia. Corneluis Allston C

## 2014-04-18 NOTE — Progress Notes (Signed)
PROGRESS NOTE  Zoe Robinson TKW:[id number] DOB: 1931/12/23 DOA: 04/14/2014 PCP: Lindwood Qua, MD  Assessment/Plan: Urinary tract infection  -ceftriaxone -- change to PO keflex x 2 more days -urine culture - e coli  Hypercalcemia  Unclear etiology. Discontinue patient's home calcium with vitamin D.  -PTH LOW and PTH related peptide, vitamin D. 25 ok, and vitamin D 1-25- others pending  -given calcitonin x 3 doses  - renal consultation - suspect malignancy  Acute renal failure - ? CKD- baseline in our system appears to be 0.8  IVF has not improved Cr -renal consult -renal u/s ok  Generalized weakness  PT and OT evaluation.  Rheumatoid arthritis  D/c methotrexate  Neutropenia ?infection and methotrexate  Atrial fibrillation  Rate controlled. lovenox until xaralto can be resume  Anemia  Likely due to chronic disease.   Hypertension  monitor  Hypokalemia -replete  Code Status: full Family Communication: patient/daughter 9/23 Disposition Plan:    Consultants:    Procedures:      HPI/Subjective: Foley placed for I/Os No SOB, no CP Eating breakfast  Objective: Filed Vitals:   04/18/14 0959  BP: 143/48  Pulse: 75  Temp: 98.2 F (36.8 C)  Resp: 19    Intake/Output Summary (Last 24 hours) at 04/18/14 1134 Last data filed at 04/18/14 1014  Gross per 24 hour  Intake    600 ml  Output   1850 ml  Net  -1250 ml   Filed Weights   04/15/14 0202 04/15/14 2027 04/17/14 2036  Weight: 42.411 kg (93 lb 8 oz) 43.5 kg (95 lb 14.4 oz) 43.545 kg (96 lb)    Exam:   General:  A+Ox 3  Cardiovascular: rrr  Respiratory: clear anterior  Abdomen: +BS, soft  Musculoskeletal: no edema   Data Reviewed: Basic Metabolic Panel:  Recent Labs Lab 04/15/14 0430 04/15/14 1818 04/16/14 0510 04/17/14 0750 04/18/14 0750  NA 142 138 143 144 139  K 4.0 4.2 3.6* 3.3* 3.6*  CL 101 99 104 105 100  CO2 32 28 28 27 25   GLUCOSE 105* 182* 152* 119* 97    BUN 49* 48* 45* 42* 44*  CREATININE 1.63* 1.74* 1.90* 2.03* 2.08*  CALCIUM >15.0* 13.4* 12.2* 10.4 11.2*   Liver Function Tests:  Recent Labs Lab 04/15/14 0430 04/15/14 1818 04/16/14 0510 04/17/14 0750 04/18/14 0750  AST 21 27 23 23  32  ALT 14 15 14 14 21   ALKPHOS 48 50 47 44 51  BILITOT 0.5 0.4 0.3 0.2* 0.2*  PROT 6.2 6.2 6.1 6.0 7.0  ALBUMIN 2.7* 2.6* 2.6* 2.6* 3.0*   No results found for this basename: LIPASE, AMYLASE,  in the last 168 hours No results found for this basename: AMMONIA,  in the last 168 hours CBC:  Recent Labs Lab 04/14/14 2230 04/15/14 0430 04/16/14 0510 04/17/14 0750 04/18/14 0750  WBC 2.8* 2.4* 1.5* 2.4* 2.0*  HGB 9.3* 8.4* 8.4* 8.2* 8.8*  HCT 28.0* 26.0* 25.5* 24.4* 26.7*  MCV 90.9 90.3 89.2 91.4 91.4  PLT 502* 470* 381 262 231   Cardiac Enzymes: No results found for this basename: CKTOTAL, CKMB, CKMBINDEX, TROPONINI,  in the last 168 hours BNP (last 3 results) No results found for this basename: PROBNP,  in the last 8760 hours CBG: No results found for this basename: GLUCAP,  in the last 168 hours  Recent Results (from the past 240 hour(s))  URINE CULTURE     Status: None   Collection Time    04/14/14 11:09 PM  Result Value Ref Range Status   Specimen Description URINE, CATHETERIZED   Final   Special Requests CX ADDED AT 0037 ON [id number]   Final   Culture  Setup Time     Final   Value: 04/15/2014 01:09     Performed at [facility]   Colony Count     Final   Value: >=100,000 COLONIES/ML     Performed at [facility]   Culture     Final   Value: ESCHERICHIA COLI     Performed at [facility]   Report Status 04/17/2014 FINAL   Final   Organism ID, Bacteria ESCHERICHIA COLI   Final     Studies: US Renal  04/17/2014   CLINICAL DATA:  Acute kidney injury and renal insufficiency.  EXAM: RENAL/URINARY TRACT ULTRASOUND COMPLETE  COMPARISON:  None.  FINDINGS: Right Kidney:  Length: 9.2 cm. Echogenicity within  normal limits. No mass or hydronephrosis visualized.  Left Kidney:  Length: 8.9 cm. Echogenicity within normal limits. No mass or hydronephrosis visualized.  Bladder:  Appears normal for degree of bladder distention.  IMPRESSION: Normal renal ultrasound.   Electronically Signed   By: Irish Lack M.D.   On: 04/17/2014 15:38    Scheduled Meds: . antiseptic oral rinse  7 mL Mouth Rinse BID  . cefTRIAXone (ROCEPHIN)  IV  1 g Intravenous Q24H  . docusate sodium  100 mg Oral BID  . enoxaparin (LOVENOX) injection  40 mg Subcutaneous Q24H  . folic acid  1 mg Oral Daily  . Influenza vac split quadrivalent PF  0.5 mL Intramuscular Tomorrow-1000  . oxybutynin  10 mg Oral Daily  . predniSONE  10 mg Oral QODAY  . predniSONE  5 mg Oral QODAY  . simvastatin  20 mg Oral QPM   Continuous Infusions:   Antibiotics Given (last 72 hours)   Date/Time Action Medication Dose Rate   04/16/14 0039 Given   cefTRIAXone (ROCEPHIN) 1 g in dextrose 5 % 50 mL IVPB 1 g 100 mL/hr   04/17/14 0126 Given   cefTRIAXone (ROCEPHIN) 1 g in dextrose 5 % 50 mL IVPB 1 g 100 mL/hr   04/18/14 0159 Given   cefTRIAXone (ROCEPHIN) 1 g in dextrose 5 % 50 mL IVPB 1 g 100 mL/hr      Principal Problem:   Generalized weakness Active Problems:   Hypercalcemia   Acute renal failure   CKD (chronic kidney disease)   RA (rheumatoid arthritis)   Hypertension   Atrial fibrillation   Dehydration   UTI (urinary tract infection)   Anemia    Time spent: 25 min    Jerrika Ledlow  [facility] Pager [phone]. If 7PM-7AM, please contact night-coverage at [email or web address], password [facility] 04/18/2014, 11:34 AM  LOS: 4 days

## 2014-04-18 NOTE — Clinical Social Work Placement (Addendum)
Clinical Social Work Department CLINICAL SOCIAL WORK PLACEMENT NOTE 04/18/2014  Patient:  LATRICA, CLOWERS  Account Number:  [id number] Admit date:  04/14/2014  Clinical Social Worker:  Genelle Bal, LCSW  Date/time:  04/18/2014 05:50 AM  Clinical Social Work is seeking post-discharge placement for this patient at the following level of care:   SKILLED NURSING   (*CSW will update this form in Epic as items are completed)     Patient/family provided with [facility] Department of Clinical Social Work's list of facilities offering this level of care within the geographic area requested by the patient (or if unable, by the patient's family).  04/18/2014  Patient/family informed of their freedom to choose among providers that offer the needed level of care, that participate in Medicare, Medicaid or managed care program needed by the patient, have an available bed and are willing to accept the patient.    Patient/family informed of [facility]' ownership interest in [facility], as well as of the fact that they are under no obligation to receive care at this facility.  PASARR submitted to EDS on 04/19/14 PASARR number received on 04/19/14  FL2 transmitted to all facilities in geographic area requested by pt/family on  04/18/2014 FL2 transmitted to all facilities within larger geographic area on   Patient informed that his/her managed care company has contracts with or will negotiate with  certain facilities, including the following:     Patient/family informed of bed offers received: 04/19/14 at 6 pm  Patient chooses bed at [facility] (made a bed offer) Physician recommends and patient chooses bed at    Patient to be transferred to [facility] on Saturday, 10/3   Patient to be transferred to facility by ambulance Patient and family notified of transfer on 10/2 (by phone) and 04/27/14 at the bedside. Name of family member notified: Erskine Emery, daughter.   The  following physician request were entered in Epic:  Additional Comments: 04/22/14: Call made to Gregary Signs, admissions director with [facility] regarding patient wanting facility at discharge. Ms. Berneda Rose advised that they will be notified as soon as patient medically stable for discharge.   04/25/14: Call made to Gregary Signs, admissions director with [facility] to advise that per MD, probable d/c date is Friday, 10/2. 04/27/14: CSW talked with MD and was advised that patient will be ready for d/c on Saturday, 10/3. **Gregary Signs with [facility] contacted regarding Saturday discharge. Number to send d/c summary is [phone], to [address], academic.  Erskine Emery, daughter [phone]) contacted and informed of Saturday discharge.

## 2014-04-18 NOTE — Clinical Social Work Psychosocial (Signed)
Clinical Social Work Department BRIEF PSYCHOSOCIAL ASSESSMENT 04/18/2014  Patient:  Zoe, Robinson     Account Number:  [id number]     Admit date:  04/14/2014  Clinical Social Worker:  Delmer Islam  Date/Time:  04/18/2014 05:44 AM  Referred by:  Physician  Date Referred:  04/17/2014 Referred for  SNF Placement   Other Referral:   Interview type:  Family Other interview type:    PSYCHOSOCIAL DATA Living Status:  ALONE Admitted from facility:   Level of care:   Primary support name:  Erskine Emery Primary support relationship to patient:  CHILD, ADULT Degree of support available:   Strong support. Patient advised nurse case manager to speak with her daughter on 04/18/14.    CURRENT CONCERNS Current Concerns  Post-Acute Placement   Other Concerns:    SOCIAL WORK ASSESSMENT / PLAN CSW spoke with Zoe Robinson by phone regarding consult for short-term rehab and daughter immediately expressed agreement. Her preferences are (1) [facility] and (2) [facility] in [place]. CSW explained facility search process and informed Zoe Robinson that she will contacted with facility responses.   Assessment/plan status:  Psychosocial Support/Ongoing Assessment of Needs Other assessment/ plan:   Information/referral to community resources:   None needed or requested at this time.    PATIENT'S/FAMILY'S RESPONSE TO PLAN OF CARE: Daughter receptive to talking with CSW and in agreement with ST rehab for patient.

## 2014-04-18 NOTE — Progress Notes (Signed)
Physical Therapy Treatment Patient Details Name: Zoe Robinson MRN: [id number] DOB: 22-Feb-1932 Today's Date: 04/18/2014    History of Present Illness Zoe Robinson is a 80 y.o. Caucasian female with history of hypertension, atrial fibrillation on chronic anticoagulation, rheumatoid arthritis, chronic kidney disease stage III, and osteoporosis on calcium and vitamin D who presents with generalized weakness; She was found to have a urinary tract infection, hypercalcemic, and acute renal failure    PT Comments    Pt was seen for getting up and walking on hallway with many safety issues identified in her note.  Pt is planning HHPT but will definitely need continual assistance based on repetition of cues for all safety rules.    Follow Up Recommendations  Home health PT;Supervision/Assistance - 24 hour     Equipment Recommendations  Rolling walker with 5" wheels;3in1 (PT)    Recommendations for Other Services OT consult     Precautions / Restrictions Precautions Precautions: Fall Precaution Comments: chair alarm Restrictions Weight Bearing Restrictions: No    Mobility  Bed Mobility Overal bed mobility: Needs Assistance Bed Mobility: Supine to Sit     Supine to sit: Min assist     General bed mobility comments: railing used with HOB up and PT assisted the final effort from Sidelying  Transfers Overall transfer level: Needs assistance Equipment used: Rolling walker (2 wheeled) Transfers: Sit to/from [employer] Sit to Stand: Min assist Stand pivot transfers: Min assist (with help to direct walker)       General transfer comment: Cues for hand placement and safety  Ambulation/Gait Ambulation/Gait assistance: Min assist Ambulation Distance (Feet): 125 Feet Assistive device: Rolling walker (2 wheeled) Gait Pattern/deviations: Step-through pattern;Decreased step length - right;Decreased step length - left;Decreased stride length;Wide base of  support;Drifts right/left Gait velocity: slowed Gait velocity interpretation: Below normal speed for age/gender General Gait Details: Direction for walker many times, pt will hit an obstacle without intervention from PT   Stairs            Wheelchair Mobility    Modified Rankin (Stroke Patients Only)       Balance Overall balance assessment: Needs assistance Sitting-balance support: Feet supported;Single extremity supported Sitting balance-Leahy Scale: Fair   Postural control: Posterior lean Standing balance support: Bilateral upper extremity supported (PT contact) Standing balance-Leahy Scale: Poor Standing balance comment: requires assistance to control today, with cues for walker control                    Cognition Arousal/Alertness: Awake/alert Behavior During Therapy: WFL for tasks assessed/performed Overall Cognitive Status: Impaired/Different from baseline Area of Impairment: Memory;Safety/judgement;Awareness;Problem solving     Memory: Decreased short-term memory   Safety/Judgement: Decreased awareness of safety;Decreased awareness of deficits Awareness: Emergent Problem Solving: Slow processing;Difficulty sequencing;Requires verbal cues;Requires tactile cues      Exercises General Exercises - Lower Extremity Hip Flexion/Marching: AROM;Both;10 reps;Standing Toe Raises: AROM;Both;10 reps;Standing Heel Raises: AROM;Both;10 reps;Standing Mini-Sqauts: AROM;Both;10 reps;Standing    General Comments General comments (skin integrity, edema, etc.): Pt is up with limited help and demonstrating limited awareness of her IV and foley lines.  Instructed her about chair alarm      Pertinent Vitals/Pain Pain Assessment: No/denies pain BP was 140/60, pulse 68 and O2 sat 99% per nsg note.    Home Living                      Prior Function  PT Goals (current goals can now be found in the care plan section) Acute Rehab PT  Goals Patient Stated Goal: To walk today Progress towards PT goals: Progressing toward goals    Frequency  Min 3X/week    PT Plan Current plan remains appropriate    Co-evaluation             End of Session Equipment Utilized During Treatment: Other (comment) (FWW) Activity Tolerance: Patient tolerated treatment well Patient left: in chair;with call bell/phone within reach;with chair alarm set;with nursing/sitter in room     Time: 5102-5852 PT Time Calculation (min): 24 min  Charges:  $Gait Training: 8-22 mins $Therapeutic Exercise: 8-22 mins                    G Codes:      Ivar Drape 04-25-14, 11:02 AM Samul Dada, PT MS Acute Rehab Dept. Number: [phone]

## 2014-04-19 ENCOUNTER — Encounter (HOSPITAL_COMMUNITY): Payer: Self-pay | Admitting: Radiology

## 2014-04-19 ENCOUNTER — Inpatient Hospital Stay (HOSPITAL_COMMUNITY): Payer: Medicare Other

## 2014-04-19 DIAGNOSIS — N170 Acute kidney failure with tubular necrosis: Secondary | ICD-10-CM | POA: Diagnosis not present

## 2014-04-19 LAB — RENAL FUNCTION PANEL
ALBUMIN: 2.8 g/dL — AB (ref 3.5–5.2)
Anion gap: 15 (ref 5–15)
BUN: 51 mg/dL — ABNORMAL HIGH (ref 6–23)
CALCIUM: 10.6 mg/dL — AB (ref 8.4–10.5)
CO2: 24 mEq/L (ref 19–32)
CREATININE: 2.01 mg/dL — AB (ref 0.50–1.10)
Chloride: 100 mEq/L (ref 96–112)
GFR calc Af Amer: 26 mL/min — ABNORMAL LOW (ref >90)
GFR calc non Af Amer: 22 mL/min — ABNORMAL LOW (ref >90)
GLUCOSE: 96 mg/dL (ref 70–99)
PHOSPHORUS: 2.6 mg/dL (ref 2.3–4.6)
Potassium: 3.6 mEq/L — ABNORMAL LOW (ref 3.7–5.3)
Sodium: 139 mEq/L (ref 137–147)

## 2014-04-19 LAB — PTH-RELATED PEPTIDE: PTH-related peptide: 15 pg/mL (ref 14–27)

## 2014-04-19 LAB — MAGNESIUM: Magnesium: 1.5 mg/dL (ref 1.5–2.5)

## 2014-04-19 LAB — CK: Total CK: 32 U/L (ref 7–177)

## 2014-04-19 MED ORDER — IOHEXOL 300 MG/ML  SOLN
25.0000 mL | INTRAMUSCULAR | Status: AC
  Administered 2014-04-19 (×2): 25 mL via ORAL

## 2014-04-19 MED ORDER — POTASSIUM CHLORIDE CRYS ER 20 MEQ PO TBCR
40.0000 meq | EXTENDED_RELEASE_TABLET | Freq: Once | ORAL | Status: AC
  Administered 2014-04-19: 40 meq via ORAL
  Filled 2014-04-19: qty 2

## 2014-04-19 MED ORDER — SODIUM CHLORIDE 0.9 % IV SOLN
30.0000 mg | Freq: Once | INTRAVENOUS | Status: AC
  Administered 2014-04-19: 30 mg via INTRAVENOUS
  Filled 2014-04-19: qty 10

## 2014-04-19 MED ORDER — MAGNESIUM SULFATE 40 MG/ML IJ SOLN
2.0000 g | Freq: Once | INTRAMUSCULAR | Status: AC
  Administered 2014-04-19: 2 g via INTRAVENOUS
  Filled 2014-04-19: qty 50

## 2014-04-19 NOTE — Progress Notes (Signed)
Patient ID: Milea Klink, female   DOB: May 08, 1932, 80 y.o.   MRN: [id number]  [facility] PROGRESS NOTE  Clarence Dunsmore KDX:[id number] DOB: 1931/10/30 DOA: 04/14/2014 PCP: Lindwood Qua, MD  Brief narrative: 80 y.o. female with hypertension, atrial fibrillation on chronic anticoagulation, rheumatoid arthritis on chronic prednisone, chronic kidney disease stage III, and osteoporosis on calcium and vitamin D, who presented to [facility] ED with with progressive weakness one week in duration, associated with nausea and poor oral intake, generalized headache, several episodes of small amount of nose bleeds which all resolved spontaneously. In ED, pt noted to have UTI, was hypercalcemic > 15, ARF with Cr 1.62. TR[facility] asked to admit  For further evaluation. Denied any chest pain, shortness of breath, abdominal pain, diarrhea, or vision changes.   Assessment and Plan:    Principal Problem:   Generalized weakness - secondary to progressive FTT, hypercalcemia, UTI - PT/OT evaluation requested, OOB to chair  Active Problems:   Hypercalcemia - elevated Ca since 04/2013 ( ~11 at that time) - this admission, Ca > 15, overall trending down but up over the past 24 hours  - Continue to hold calcium/vitamin D supplementation.  - Etiology remains unclear, worrisome for occult malignancy  - follow-up SPEP/UPEP, IFE, serum light chains, Ig levels, 24-hr urine protein/Cr clearance - Continue NS 100 mL/hr and pamidronate.  - CT chest, abd, pelvis pending    Acute on chronic renal failure - last Cr of 0.8 on 05/17/13 - Cr trend since admission: 1.74 --> 1.90 --> 2.08 --> 2.01 - appreciate nephrology team assistance  - renal US unremarkable for acute abnormalities (9/23) - avoid nephrotoxins (valsartan on hold, pt takes at home) - continue to monitor daily weights, I's and O's - repeat renal function test in AM   RA (rheumatoid arthritis) - continue Prednisone    Hypertension - soft BP this AM, SBP in  100's - continue to monitor vitals   Atrial fibrillation - rate controlled  - on Xarelto at home, here she is on Lovenox    UTI (urinary tract infection) - E. Coli, sensitive to Cephalexin, continue Cephalexin, day #2 (started 9/24) - Pt treated with Rocephin 9/21 - 9/24 (4 days total) - 7 days treatment is reasonable, so will need one more day of Cephalexin, stop after tomorrow's therapy    Leukopenia  - pt on Methotrexate at home but unclear that is contributing - unclear etiology and certainly worrisome for occult malignancy - holding methotrexate and repeat CBC in AM - follow upon CT chest, abd, pelvis    Hypokalemia/Hypomagnesemia  - K 3.6 this AM. Mg still low-nomal low at 1.5 - continue to supplement both electrolytes - repeat BMP and Mg level in AM    Chronic Normocytic Anemia  - Hg remains stable with no signs of active bleeding - repeat CBC in AM   Osteoporosis  - Continue to hold calcium  supplementation in setting of hypercalcemia.    Severe PCM - from underlying FTT and deconditioning - advance diet as pt able to tolerate   DVT prophylaxis  Lovenox SQ while pt is in hospital  Code Status: Full Family Communication: Pt at bedside Disposition Plan: Remains inpatient   IV Access:   Peripheral IV Procedures and diagnostic studies:     Dg Bone Survey Met  May 04, 2014   No evidence of multiple myeloma. No evidence of neoplastic disease to bone. Chronic burst fracture of L1.  Diffuse bony demineralization.   Medical Consultants:   Nephrology  Other  Consultants:   Physical therapy  Anti-Infectives:   Cephalexin   Faye Ramsay, MD  [facility] Pager [phone]  If 7PM-7AM, please contact night-coverage [email or web address] Password [facility] 04/19/2014, 4:34 PM   LOS: 5 days   HPI/Subjective: No events overnight.   Objective: Filed Vitals:   04/18/14 1759 04/18/14 2104 04/19/14 0500 04/19/14 0948  BP: 114/51 107/39 135/61 100/59  Pulse: 67 73 71 81  Temp: 98.4 F  (36.9 C) 98 F (36.7 C) 98.1 F (36.7 C) 98.4 F (36.9 C)  TempSrc: Oral Oral Oral Oral  Resp: 19 18 18 19   Height:  4' 10"  (1.473 m)    Weight:  42.321 kg (93 lb 4.8 oz) 42.32 kg (93 lb 4.8 oz)   SpO2: 99% 97% 98% 100%    Intake/Output Summary (Last 24 hours) at 04/19/14 1634 Last data filed at 04/19/14 0953  Gross per 24 hour  Intake    600 ml  Output    525 ml  Net     75 ml    Exam:   General:  Pt is alert, follows commands appropriately, cachectic and frail   Cardiovascular: Regular rate and rhythm, no rubs, no gallops  Respiratory: Clear to auscultation bilaterally, no wheezing, no crackles, no rhonchi  Abdomen: Soft, non tender, non distended, bowel sounds present, no guarding  Data Reviewed: Basic Metabolic Panel:  Recent Labs Lab 04/15/14 1818 04/16/14 0510 04/17/14 0750 04/18/14 0750 04/19/14 0530 04/19/14 0538  NA 138 143 144 139 139  --   K 4.2 3.6* 3.3* 3.6* 3.6*  --   CL 99 104 105 100 100  --   CO2 28 28 27 25 24   --   GLUCOSE 182* 152* 119* 97 96  --   BUN 48* 45* 42* 44* 51*  --   CREATININE 1.74* 1.90* 2.03* 2.08* 2.01*  --   CALCIUM 13.4* 12.2* 10.4 11.2* 10.6*  --   MG  --   --   --  1.4*  --  1.5  PHOS  --   --   --  2.6 2.6  --    Liver Function Tests:  Recent Labs Lab 04/15/14 0430 04/15/14 1818 04/16/14 0510 04/17/14 0750 04/18/14 0750 04/19/14 0530  AST 21 27 23 23  32  --   ALT 14 15 14 14 21   --   ALKPHOS 48 50 47 44 51  --   BILITOT 0.5 0.4 0.3 0.2* 0.2*  --   PROT 6.2 6.2 6.1 6.0 7.0  --   ALBUMIN 2.7* 2.6* 2.6* 2.6* 3.0* 2.8*   CBC:  Recent Labs Lab 04/14/14 2230 04/15/14 0430 04/16/14 0510 04/17/14 0750 04/18/14 0750  WBC 2.8* 2.4* 1.5* 2.4* 2.0*  HGB 9.3* 8.4* 8.4* 8.2* 8.8*  HCT 28.0* 26.0* 25.5* 24.4* 26.7*  MCV 90.9 90.3 89.2 91.4 91.4  PLT 502* 470* 381 262 231   Cardiac Enzymes:  Recent Labs Lab 04/19/14 0538  CKTOTAL 32   Recent Results (from the past 240 hour(s))  URINE CULTURE      Status: None   Collection Time    04/14/14 11:09 PM      Result Value Ref Range Status   Specimen Description URINE, CATHETERIZED   Final   Special Requests CX ADDED AT 0037 ON [id number]   Final   Culture  Setup Time     Final   Value: 04/15/2014 01:09     Performed at [facility]     Final  Value: >=100,000 COLONIES/ML     Performed at [facility]   Culture     Final   Value: ESCHERICHIA COLI     Performed at [facility]   Report Status 04/17/2014 FINAL   Final   Organism ID, Bacteria ESCHERICHIA COLI   Final     Scheduled Meds: . cephALEXin  500 mg Oral Q12H  . docusate sodium  100 mg Oral BID  . enoxaparin injection  40 mg Subcutaneous Q24H  . folic acid  1 mg Oral Daily  . oxybutynin  10 mg Oral Daily  . pamidronate  30 mg Intravenous Once  . predniSONE  10 mg Oral QODAY  . predniSONE  5 mg Oral QODAY  . simvastatin  20 mg Oral QPM   Continuous Infusions: . sodium chloride 100 mL/hr (04/19/14 0333)

## 2014-04-19 NOTE — Progress Notes (Signed)
ANTICOAGULATION CONSULT NOTE - Follow Up Consult  Pharmacy Consult for Lovenox Indication: atrial fibrillation  No Known Allergies  Patient Measurements: Height: 4\' 10"  (147.3 cm) Weight: 93 lb 4.8 oz (42.32 kg) IBW/kg (Calculated) : 40.9  Vital Signs: Temp: 98.1 F (36.7 C) (09/25 0500) Temp src: Oral (09/25 0500) BP: 135/61 mmHg (09/25 0500) Pulse Rate: 71 (09/25 0500)  Labs:  Recent Labs  04/17/14 0750 04/18/14 0750 04/19/14 0530 04/19/14 0538  HGB 8.2* 8.8*  --   --   HCT 24.4* 26.7*  --   --   PLT 262 231  --   --   CREATININE 2.03* 2.08* 2.01*  --   CKTOTAL  --   --   --  32    Estimated Creatinine Clearance: 14.7 ml/min (by C-G formula based on Cr of 2.01).   Assessment: 47 YOF who presented on 9/21 with symptomatic hypercalcemia and acute on chronic kidney disease. The patient was noted to be on Xarelto PTA for hx Afib however use is contraindicated given the patient's worsening renal function, SCr 2.01, CrCl~10-15 ml/min. The patient was transitioned to Lovenox on 9/23 for anticoagulation while Xarelto on hold and awaiting renal recovery. The renal team has been consulted for work up. Lovenox dose remains appropriate. Hgb/Hct low but stable, plts trending down - will watch. No overt s/sx of bleeding noted.    Goal of Therapy:  Anti-Xa level 0.6-1 units/ml 4hrs after LMWH dose given Monitor platelets by anticoagulation protocol: Yes   Plan:  1. Continue Lovenox 40 mg SQ every 24 hours 2. Will continue to monitor for any signs/symptoms of bleeding and renal function for dose adjustments for lovenox and appropriateness to transition back to Xarelto  10/23, PharmD, BCPS Clinical Pharmacist Pager: [phone] 04/19/2014 9:18 AM

## 2014-04-19 NOTE — Progress Notes (Signed)
I saw the patient and agree with the above assessment and plan.    Pt w/ hypercalcemia with suppressed PTH, absent PTHrP, and elevated 1,25 OH VIt D.  Not on calcitriol.  Endogenous sources suggest lymphoma (pt w/ some weight loss prior to admission, but no LAN on exam, no F/C), sarcoid (no clear hx/o pulm Sx, CXR neg upon admission), Tb (Quantiferon gold today), and other granulomatous disorders.  I don't think her PO intake of Vit D and CaCO3 are responsible for this.  I have strong concern for occult malignancy.   Today will scan CT C/A/P w/ PO contrast.  Serum ACEi, TB testing.  Ca creeping back up, effect of calcitonin has wanted and IVFs will not be sufficient.  Will give 1x dose of pamidronate 30mg .

## 2014-04-19 NOTE — Progress Notes (Signed)
[facility] Rounding Note   Subjective:  Pt seen and examined in AM. No acute events overnight. She reports improved appetite and weakness with no dyspnea, LE swelling,  nausea, vomiting, or abdominal pain. She continues to make good urine output (1.47L) via foley catheter. She continues to be constipated (no BM since hospitalization).  She denies bone pain.    Objective:   General: Resting comfortably in bed Cardiac: Normal rate and rhythm  Pulmonary: Anterior field clear to ausculation  GI: Soft, non-tender, non-distended with normal BS Extremities; No LE edema.  Vital signs in last 24 hours: Filed Vitals:   04/18/14 0959 04/18/14 1759 04/18/14 2104 04/19/14 0500  BP: 143/48 114/51 107/39 135/61  Pulse: 75 67 73 71  Temp: 98.2 F (36.8 C) 98.4 F (36.9 C) 98 F (36.7 C) 98.1 F (36.7 C)  TempSrc: Oral Oral Oral Oral  Resp: _0 Height:   _1  (1.473 m)   Weight:   93 lb 4.8 oz (42.321 kg) 93 lb 4.8 oz (42.32 kg)  SpO2: 94% 99% 97% 98%   Weight change: -2 lb 11.2 oz (-1.225 kg)  Intake/Output Summary (Last 24 hours) at 04/19/14 0814 Last data filed at 04/19/14 0651  Gross per 24 hour  Intake    960 ml  Output   1475 ml  Net   -515 ml    Physical Exam:  Blood pressure 135/61, pulse 71, temperature 98.1 F (36.7 C), temperature source Oral, resp. rate 18, height _2  (1.473 m), weight 93 lb 4.8 oz (42.32 kg), SpO2 98.00%.  Labs:   Recent Labs Lab 04/14/14 2230 04/15/14 0430 04/15/14 1818 04/16/14 0510 04/17/14 0750 04/18/14 0750 04/19/14 0530  NA 135* 142 138 143 144 139 139  K 4.5 4.0 4.2 3.6* 3.3* 3.6* 3.6*  CL 92* 101 99 104 105 100 100  CO2 33* 32 _3 GLUCOSE 121* 105* 182* 152* 119* 97 96  BUN 52* 49* 48* 45* 42* 44* 51*  CREATININE 1.62* 1.63* 1.74* 1.90* 2.03* 2.08* 2.01*  CALCIUM >15.0* >15.0* 13.4* 12.2* 10.4 11.2* 10.6*  PHOS  --   --   --   --   --  2.6 2.6     Recent Labs Lab 04/16/14 0510  04/17/14 0750 04/18/14 0750 04/19/14 0530  AST 23 23 32  --   ALT _4 --   ALKPHOS 47 44 51  --   BILITOT 0.3 0.2* 0.2*  --   PROT 6.1 6.0 7.0  --   ALBUMIN 2.6* 2.6* 3.0* 2.8*   No results found for this basename: LIPASE, AMYLASE,  in the last 168 hours No results found for this basename: AMMONIA,  in the last 168 hours   Recent Labs Lab 04/15/14 0430 04/16/14 0510 04/17/14 0750 04/18/14 0750  WBC 2.4* 1.5* 2.4* 2.0*  HGB 8.4* 8.4* 8.2* 8.8*  HCT 26.0* 25.5* 24.4* 26.7*  MCV 90.3 89.2 91.4 91.4  PLT 470* 381 262 231    _5 (inr:5)  ) Recent Labs Lab 04/19/14 0538  CKTOTAL 32    No results found for this basename: GLUCAP,  in the last 168 hours    Recent Labs Lab 04/15/14 0430  IRON 51  TIBC 225*  FERRITIN 443*    Studies/Results: US Renal  04/17/2014   CLINICAL DATA:  Acute kidney injury and renal insufficiency.  EXAM: RENAL/URINARY TRACT ULTRASOUND COMPLETE  COMPARISON:  None.  FINDINGS: Right Kidney:  Length: 9.2 cm. Echogenicity within normal limits. No mass or hydronephrosis visualized.  Left Kidney:  Length: 8.9 cm. Echogenicity within normal limits. No mass or hydronephrosis visualized.  Bladder:  Appears normal for degree of bladder distention.  IMPRESSION: Normal renal ultrasound.   Electronically Signed   By: Aletta Edouard M.D.   On: 04/17/2014 15:38    . sodium chloride 100 mL/hr (04/19/14 0333)   . antiseptic oral rinse  7 mL Mouth Rinse BID  . cephALEXin  500 mg Oral Q12H  . docusate sodium  100 mg Oral BID  . enoxaparin (LOVENOX) injection  40 mg Subcutaneous Q24H  . folic acid  1 mg Oral Daily  . Influenza vac split quadrivalent PF  0.5 mL Intramuscular Tomorrow-1000  . magnesium sulfate 1 - 4 g bolus IVPB  2 g Intravenous Once  . oxybutynin  10 mg Oral Daily  . predniSONE  10 mg Oral QODAY  . predniSONE  5 mg Oral QODAY  . simvastatin  20 mg Oral QPM     I  have reviewed scheduled and prn medications.   Background:  Zoe Robinson is an 80 y.o. female with past medical history of hypertension, atrial fibrillation on xarelto, rheumatoid arthritis on methotrexate and prednisone, CKD Stage II, chronic anemia on oral iron replacement, and osteoporosis on oral calcium/vitamin D replacement who we are consulted for in regards to hypercalcemia (>15 on admission) and AKI on CKD stage II (baseline 0.8 with peak of 2.08).   Plan:  Chronic Hypercalcemia with low PTH level- Pt has had chronically elevated calcium since at least 04/2013 with corrected calcium of 11 at that time. On admission was >15, improved to 11.6 today after IV fluids re-started. Pt has also received 4 doses of calcitonin administration since hospitalization. Continue to hold calcium/vitamin D supplementation.  Etiology unclear, work-up for multiple myeloma (anemia, renal disease, elevated total protein on 04/2013, recent infection, and compression fracture last year in setting of osteoporosis after fall), follow-up SPEP/UPEP, IFE, serum light chains, Ig levels, 24-hr urine protein/Cr clearance, and skeletal survery.  PTHrP is normal but 1,25 vitamin D levels are elevated suggesting extra-renal hydroxylase production due to possible granulomatous disease (sarcoid), TB, lymphoma, fungal disease, or RA with history of nodules, however no current flare. Obtain CT chest/abdomen/pelvis w/o contrast, quantiferon Tb gold assay, and ACE level. Continue NS 100 mL/hr (for concomitant AKI) and administer IV bisphosphate therapy with pamidronate.  Non-oliguric AKI on CKD Stage II - Pt with last Cr of 0.8 on 05/17/13 with GFR 77, currently 2.01 with GFR 22. Etiology most likely pre-renal azotemia (BUN/Cr ratio>20:1) due to volume depletion in setting of hypercalcemia and decreased PO intake for past week. Renal US normal on 9/23 and UA on admission with no proteinuria and trace hematuria (RBC 3-6). Continue daily renal function panel monitoring, daily weight, strict I/O's, and  avoidance of nephrotoxins (home valsartan). Continue NS 100 mL/hr. Hypokalemia/Hypomagnesemia - K 3.6 this AM. Magnesium still low-nomal low at 1.5. Replete with IV 2 magnesium sulfate. Continue to monitor.  E. Coli UTI - Currently on PO keflex 500 mg BID (total day of antibiotics 6).  Hypertension - Currently normotensive. Hold home valsartan 320 mg daily.  Leukopenia -Awaiting AM labs.  WBC yesterday 2K, previously normal (7.5K) on 04/2013. Pt at home on methotrexate biweekly for RA which can cause bone marrow suppression, possibly toxicity?  Malignancy also possible. Continue to hold home methotrexate. Consider flow cytometry.  Chronic Normocytic Anemia - Awaiting AM labs. Hg  8.2 yesterday. Anemia panel with elevated ferrritin (443) and low iron sat 23%. Last Hg 11.9 on 05/17/13. Pt at home on oral ferrous sulfate 325 mg daily.  Rheumatoid Arthritis on immunosuppressant therapy - Pt on chronic prednisone 5-10 mg daily and methotrexate 7.5-10 mg biweekly. Hold methotrexate in setting of infection. Pt currently on prednisone 5-10 daily.  Osteoporosis - Pt is on biyearly prolia (Aug 2014 and Feb 2015) and OTC calcium/vitamin D supplementation. Continue to hold supplementation in setting of hypercalcemia.  Atrial Fibrillation on AC therapy - Pt at home on xarelto which she received on first two days of admission. She is currently on lovenox therapy. She is not on rate control therapy.   Juluis Mire, MD  PGY-II IMTS  Pager: [phone]  Attending note to follow

## 2014-04-20 DIAGNOSIS — D721 Eosinophilia, unspecified: Secondary | ICD-10-CM

## 2014-04-20 DIAGNOSIS — D649 Anemia, unspecified: Secondary | ICD-10-CM

## 2014-04-20 DIAGNOSIS — D709 Neutropenia, unspecified: Secondary | ICD-10-CM | POA: Diagnosis present

## 2014-04-20 DIAGNOSIS — D72819 Decreased white blood cell count, unspecified: Secondary | ICD-10-CM

## 2014-04-20 DIAGNOSIS — D638 Anemia in other chronic diseases classified elsewhere: Secondary | ICD-10-CM

## 2014-04-20 DIAGNOSIS — K5900 Constipation, unspecified: Secondary | ICD-10-CM | POA: Diagnosis present

## 2014-04-20 DIAGNOSIS — R509 Fever, unspecified: Secondary | ICD-10-CM | POA: Diagnosis present

## 2014-04-20 LAB — CBC WITH DIFFERENTIAL/PLATELET
BASOS ABS: 0 10*3/uL (ref 0.0–0.1)
Basophils Relative: 2 % — ABNORMAL HIGH (ref 0–1)
Eosinophils Absolute: 0.2 10*3/uL (ref 0.0–0.7)
Eosinophils Relative: 12 % — ABNORMAL HIGH (ref 0–5)
HCT: 23.2 % — ABNORMAL LOW (ref 36.0–46.0)
Hemoglobin: 7.6 g/dL — ABNORMAL LOW (ref 12.0–15.0)
Lymphocytes Relative: 39 % (ref 12–46)
Lymphs Abs: 0.6 10*3/uL — ABNORMAL LOW (ref 0.7–4.0)
MCH: 30.2 pg (ref 26.0–34.0)
MCHC: 32.8 g/dL (ref 30.0–36.0)
MCV: 92.1 fL (ref 78.0–100.0)
MONO ABS: 0.1 10*3/uL (ref 0.1–1.0)
Monocytes Relative: 8 % (ref 3–12)
Neutro Abs: 0.7 10*3/uL — ABNORMAL LOW (ref 1.7–7.7)
Neutrophils Relative %: 39 % — ABNORMAL LOW (ref 43–77)
PLATELETS: 195 10*3/uL (ref 150–400)
RBC: 2.52 MIL/uL — ABNORMAL LOW (ref 3.87–5.11)
RDW: 17.4 % — ABNORMAL HIGH (ref 11.5–15.5)
WBC: 1.6 10*3/uL — ABNORMAL LOW (ref 4.0–10.5)

## 2014-04-20 LAB — C-REACTIVE PROTEIN: CRP: 1.1 mg/dL — AB (ref <0.60)

## 2014-04-20 LAB — RENAL FUNCTION PANEL
ANION GAP: 16 — AB (ref 5–15)
Albumin: 2.3 g/dL — ABNORMAL LOW (ref 3.5–5.2)
BUN: 44 mg/dL — ABNORMAL HIGH (ref 6–23)
CALCIUM: 9.1 mg/dL (ref 8.4–10.5)
CO2: 19 meq/L (ref 19–32)
Chloride: 103 mEq/L (ref 96–112)
Creatinine, Ser: 1.69 mg/dL — ABNORMAL HIGH (ref 0.50–1.10)
GFR, EST AFRICAN AMERICAN: 32 mL/min — AB (ref >90)
GFR, EST NON AFRICAN AMERICAN: 28 mL/min — AB (ref >90)
Glucose, Bld: 89 mg/dL (ref 70–99)
PHOSPHORUS: 2 mg/dL — AB (ref 2.3–4.6)
Potassium: 4 mEq/L (ref 3.7–5.3)
Sodium: 138 mEq/L (ref 137–147)

## 2014-04-20 LAB — PROTEIN, URINE, 24 HOUR
COLLECTION INTERVAL-UPROT: 24 h
PROTEIN 24H UR: 275 mg/d — AB (ref 50–100)
Protein, Urine: 11 mg/dL
URINE TOTAL VOLUME-UPROT: 2500 mL

## 2014-04-20 LAB — LACTATE DEHYDROGENASE: LDH: 187 U/L (ref 94–250)

## 2014-04-20 LAB — CREATININE CLEARANCE, URINE, 24 HOUR
Collection Interval-CRCL: 24 hours
Creatinine Clearance: 26 mL/min — ABNORMAL LOW (ref 75–115)
Creatinine, 24H Ur: 753 mg/d (ref 700–1800)
Creatinine, Urine: 30.1 mg/dL
Creatinine: 2.01 mg/dL — ABNORMAL HIGH (ref 0.50–1.10)
Urine Total Volume-CRCL: 2500 mL

## 2014-04-20 LAB — IGG, IGA, IGM
IGA: 233 mg/dL (ref 69–380)
IGG (IMMUNOGLOBIN G), SERUM: 769 mg/dL (ref 690–1700)
IgM, Serum: 49 mg/dL — ABNORMAL LOW (ref 52–322)

## 2014-04-20 LAB — URIC ACID: Uric Acid, Serum: 5.1 mg/dL (ref 2.4–7.0)

## 2014-04-20 LAB — MAGNESIUM: Magnesium: 1.7 mg/dL (ref 1.5–2.5)

## 2014-04-20 LAB — ANGIOTENSIN CONVERTING ENZYME: Angiotensin-Converting Enzyme: 30 U/L (ref 8–52)

## 2014-04-20 LAB — SEDIMENTATION RATE: Sed Rate: 86 mm/hr — ABNORMAL HIGH (ref 0–22)

## 2014-04-20 MED ORDER — BISACODYL 10 MG RE SUPP
10.0000 mg | Freq: Every day | RECTAL | Status: DC | PRN
  Administered 2014-04-20: 10 mg via RECTAL
  Filled 2014-04-20: qty 1

## 2014-04-20 MED ORDER — FLEET ENEMA 7-19 GM/118ML RE ENEM
1.0000 | ENEMA | Freq: Every day | RECTAL | Status: DC | PRN
  Filled 2014-04-20: qty 1

## 2014-04-20 MED ORDER — POLYETHYLENE GLYCOL 3350 17 G PO PACK
17.0000 g | PACK | Freq: Two times a day (BID) | ORAL | Status: DC
  Administered 2014-04-20 – 2014-04-27 (×12): 17 g via ORAL
  Filled 2014-04-20 (×16): qty 1

## 2014-04-20 MED ORDER — PHENOL 1.4 % MT LIQD: 1.0000 | OROMUCOSAL | Status: DC | PRN

## 2014-04-20 MED ORDER — ENSURE COMPLETE PO LIQD
237.0000 mL | Freq: Three times a day (TID) | ORAL | Status: DC
  Administered 2014-04-20 – 2014-04-27 (×19): 237 mL via ORAL

## 2014-04-20 NOTE — Progress Notes (Signed)
Admit: 04/14/2014 LOS: 6  68F admitted with hypercalcemia (>15) w/ suppressed PTH, absent PTHrP, and elevated 1,25 OH Vit D.  CT CAP (no IV contrast) 9/25 negative.  S/p pamidronate 30mg  IV x1 9/25/152  Subjective:  CT CAP negative yesterday S/p pamidronate ACE blood level WNL Quantiferon Goldpending SPEP and sFLC pending; skeletal survey negative 04/18/14 Serum creatinine improved. Calcium improved to 9.1. Patient having constipation and no bowel movement since admission. Patient with persistent leukopenia. Patient also with eosinophilia.    09/25 0701 - 09/26 0700 In: 1800 [P.O.:600; I.V.:1200] Out: 2275 [Urine:2275]  Filed Weights   04/18/14 2104 04/19/14 0500 04/19/14 2033  Weight: 42.321 kg (93 lb 4.8 oz) 42.32 kg (93 lb 4.8 oz) 43.545 kg (96 lb)    Scheduled Meds: . antiseptic oral rinse  7 mL Mouth Rinse BID  . cephALEXin  500 mg Oral Q12H  . docusate sodium  100 mg Oral BID  . enoxaparin (LOVENOX) injection  40 mg Subcutaneous Q24H  . folic acid  1 mg Oral Daily  . oxybutynin  10 mg Oral Daily  . polyethylene glycol  17 g Oral BID  . predniSONE  10 mg Oral QODAY  . predniSONE  5 mg Oral QODAY  . simvastatin  20 mg Oral QPM   Continuous Infusions: . sodium chloride 100 mL/hr at 04/19/14 2245   PRN Meds:.acetaminophen, acetaminophen, HYDROcodone-acetaminophen, ondansetron (ZOFRAN) IV, ondansetron  Current Labs: reviewed   Physical Exam:  Blood pressure 126/55, pulse 71, temperature 100 F (37.8 C), temperature source Oral, resp. rate 21, height 4\' 10"  (1.473 m), weight 43.545 kg (96 lb), SpO2 96.00%. Frail thin elderly female, answers questions RRR CTAB S/nt/nd No LEE No rashes/lesions  Assessment 1. Hypercalcemia and Hypervitaminosis D; suspect some endogenous source of Vit D generation: CT CAP pelvis w/o LAN or clear evidence of lymphoma, ACE and CXR not suggestive of sarcoid; Tb testing pending.  S/p pamidronate 04/19/14 after tachyphylaxis of calcitonin.   On Hydration still.  On chronic prednisone.   2. AKI 2/2 ATN; nonoliguric, improving, 2/2 #1 3. Leukopenia, relative eosinophilia 4. HTN, mild, stable 5. AFib, on xarelto outpt, LMWH as inpt 6. RA on chronic MTX + Prednisone; stable  Plan 1. Await remaining studies 2. To this point no clear evidence of malignancy; given hematologic abnormalities have asked hematology see and hopefully provide some clarity 3. Cont IVFs for another 24h  MD 04/20/2014, 9:21 AM   Recent Labs Lab 04/18/14 0750 04/19/14 0530 04/20/14 0500 04/20/14 0550  NA 139 139  --  138  K 3.6* 3.6*  --  4.0  CL 100 100  --  103  CO2 25 24  --  19  GLUCOSE 97 96  --  89  BUN 44* 51*  --  44*  CREATININE 2.08* 2.01* 2.01* 1.69*  CALCIUM 11.2* 10.6*  --  9.1  PHOS 2.6 2.6  --  2.0*    Recent Labs Lab 04/17/14 0750 04/18/14 0750 04/20/14 0550  WBC 2.4* 2.0* 1.6*  NEUTROABS  --   --  0.7*  HGB 8.2* 8.8* 7.6*  HCT 24.4* 26.7* 23.2*  MCV 91.4 91.4 92.1  PLT 262 231 195

## 2014-04-20 NOTE — Progress Notes (Signed)
Patient has temperature of 102.7.  [facility] called and advised.  Blood Culture ordered.  650 mg of Tylenol PO given.  Will continue to monitor patient.  Bernie Covey RN-BC, [employer]

## 2014-04-20 NOTE — Consult Note (Signed)
[facility] NOTE  Patient Care Team: [name], MD as PCP - General (Internal Medicine) [name], MD (Internal Medicine)  CHIEF COMPLAINTS/PURPOSE OF CONSULTATION:  Severe hypercalcemia with anemia and leukopenia  HISTORY OF PRESENTING ILLNESS:  Zoe Robinson 80 y.o. female is here because of severe hypocalcemia with a serum calcium on admission greater than 15. She was extremely somnolent and was also having issues with constipation. She was evaluated by nephrology in extensive blood work did not reveal any abnormalities of PTH or PTH RP. PTH has been suppressed appropriately. Vitamin D levels were checked and she had elevated 125 vitamin D levels. She was also found to be anemic with a normal MCV and a hemoglobin of 8.4 on admission. Workup with iron studies U38 and folic acid were normal. LDH was also normal. She had suppressed reticulocyte count. The white count differential done on 04/20/2014 revealed an absolute neutrophil count of 700 and absolute lymphocyte count of 600. Even though the percentage is no focal count appears to be at 12%, I do not believe it is eosinophilia because the absolute eosinophil count was only 200. Blood work also did not show any evidence of myeloma in terms of serum protein electrophoresis and a bone survey did not reveal any lytic bone lesions.  Patient's daughter states that prior to all of this she was very active with the big garden that she took care of it herself and was very active. After she was hospitalized she was given pamidronate which decreased her serum calcium levels down to 9.1. Patient is still very somnolent.  I reviewed her records extensively and collaborated the history with the patient.  MEDICAL HISTORY:  Past Medical History  Diagnosis Date  . Atrial fibrillation   . Arthritis   . Hypertension   . CKD (chronic kidney disease)   . RA (rheumatoid arthritis)     SURGICAL HISTORY: Past Surgical History   Procedure Laterality Date  . Abdominal hysterectomy      SOCIAL HISTORY: History   Social History  . Marital Status: Married    Spouse Name: N/A    Number of Children: N/A  . Years of Education: N/A   Occupational History  . Not on file.   Social History Main Topics  . Smoking status: Former Smoker    Quit date: 04/15/1964  . Smokeless tobacco: Not on file  . Alcohol Use: No  . Drug Use: No  . Sexual Activity: Not on file   Other Topics Concern  . Not on file   Social History Narrative  . No narrative on file    FAMILY HISTORY: Family History  Problem Relation Age of Onset  . Heart attack Mother   . Heart attack Father   . Heart attack Brother   . Heart attack Brother   . Stomach cancer Brother     ALLERGIES:  has No Known Allergies.  MEDICATIONS:  Current Facility-Administered Medications  Medication Dose Route Frequency Provider Last Rate Last Dose  . 0.9 %  sodium chloride infusion   Intravenous Continuous Estanislado Emms, MD 100 mL/hr at 04/19/14 2245    . acetaminophen (TYLENOL) tablet 650 mg  650 mg Oral Q6H PRN Bynum Bellows, MD   650 mg at 04/20/14 1233   Or  . acetaminophen (TYLENOL) suppository 650 mg  650 mg Rectal Q6H PRN Bynum Bellows, MD      . antiseptic oral rinse (CPC / CETYLPYRIDINIUM CHLORIDE 0.05%) solution 7 mL  7 mL Mouth Rinse BID Bynum Bellows, MD   7 mL at 04/19/14 2150  . cephALEXin (KEFLEX) capsule 500 mg  500 mg Oral Q12H Jessica U Vann, DO   500 mg at 04/20/14 1051  . docusate sodium (COLACE) capsule 100 mg  100 mg Oral BID Bynum Bellows, MD   100 mg at 04/20/14 1051  . enoxaparin (LOVENOX) injection 40 mg  40 mg Subcutaneous Q24H Rolla Flatten, RPH   40 mg at 04/19/14 2044  . folic acid (FOLVITE) tablet 1 mg  1 mg Oral Daily Bynum Bellows, MD   1 mg at 04/20/14 1051  . HYDROcodone-acetaminophen (NORCO/VICODIN) 5-325 MG per tablet 1 tablet  1 tablet Oral Q4H PRN Bynum Bellows, MD      . ondansetron [facility]) tablet 4 mg   4 mg Oral Q6H PRN Bynum Bellows, MD       Or  . ondansetron (ZOFRAN) injection 4 mg  4 mg Intravenous Q6H PRN Bynum Bellows, MD      . oxybutynin (DITROPAN-XL) 24 hr tablet 10 mg  10 mg Oral Daily Bynum Bellows, MD   10 mg at 04/20/14 1051  . polyethylene glycol (MIRALAX / GLYCOLAX) packet 17 g  17 g Oral BID Rexene Agent, MD   17 g at 04/20/14 1051  . predniSONE (DELTASONE) tablet 10 mg  10 mg Oral QODAY Jessica U Vann, DO   10 mg at 04/20/14 1229  . predniSONE (DELTASONE) tablet 5 mg  5 mg Oral QODAY Jessica U Vann, DO   5 mg at 04/19/14 0929  . simvastatin (ZOCOR) tablet 20 mg  20 mg Oral QPM Bynum Bellows, MD   20 mg at 04/19/14 1717    REVIEW OF SYSTEMS:   Constitutional: Fevers are present today, feels very weak fatigued and somnolent Eyes: Tired Ears, nose, mouth, throat, and face: Denies mucositis or sore throat Respiratory: Denies cough, dyspnea or wheezes Cardiovascular: Denies palpitation, chest discomfort or lower extremity swelling Gastrointestinal:  Constipation Skin: Denies abnormal skin rashes Lymphatics: Denies new lymphadenopathy or easy bruising Neurological:weakness of extremities All other systems were reviewed with the patient and are negative.  PHYSICAL EXAMINATION: ECOG PERFORMANCE STATUS: 3 - Symptomatic, >50% confined to bed  Filed Vitals:   04/20/14 0958  BP: 108/48  Pulse: 63  Temp: 99.2 F (37.3 C)  Resp: 18   Filed Weights   04/18/14 2104 04/19/14 0500 04/19/14 2033  Weight: 93 lb 4.8 oz (42.321 kg) 93 lb 4.8 oz (42.32 kg) 96 lb (43.545 kg)    GENERAL:alert, no distress and comfortable SKIN: Dry skin EYES: Sunken eyeballs OROPHARYNX:no exudate, no erythema and lips, buccal mucosa, and tongue normal  NECK: supple, thyroid normal size, non-tender, without nodularity LYMPH:  no palpable lymphadenopathy in the cervical, axillary or inguinal LUNGS: clear to auscultation and percussion with normal breathing effort HEART: regular rate & rhythm  and no murmurs and no lower extremity edema ABDOMEN:abdomen soft, non-tender and normal bowel sounds Musculoskeletal: Weakness of muscles NEURO: no focal motor/sensory deficits   LABORATORY DATA:  I have reviewed the data as listed Lab Results  Component Value Date   WBC 1.6* 04/20/2014   HGB 7.6* 04/20/2014   HCT 23.2* 04/20/2014   MCV 92.1 04/20/2014   PLT 195 04/20/2014   Lab Results  Component Value Date   NA 138 04/20/2014   K 4.0 04/20/2014   CL 103 04/20/2014   CO2 19 04/20/2014  RADIOGRAPHIC STUDIES: I have personally reviewed the radiological reports and agreed with the findings in the report. CT of the chest abdomen pelvis did not show any major evidence of lymphoma there were some small nodules in the mediastinum that were nonspecific  ASSESSMENT AND PLAN:  Severe hypercalcemia: I discussed the patient's daughter the differential diagnosis was between thyroid and parathyroid disorders versus myeloma versus lymphoma versus vitamin D toxicosis. I do not see any evidence of myeloma or lymphoma and the blood work. The serum calcium level appears to have normalized after receiving pamidronate.  Severe normocytic anemia and leukopenia: I will request a bone marrow biopsy to be done by interventional radiology. If the bone marrow biopsy is normal then there would not be any further workup that can explain her condition.  Elevated percent eosinophil count: Patient does not have eosinophilia because absolute eosinophil counts are normal.   Rulon Eisenmenger, MD @T @ 1:27 PM

## 2014-04-20 NOTE — Progress Notes (Signed)
Chart reviewed.  Patient ID: Zoe Robinson, female   DOB: 05-Nov-1931, 80 y.o.   MRN: [id number]  [facility] PROGRESS NOTE  Jamariya Davidoff HQI:[id number] DOB: January 21, 1932 DOA: 04/14/2014 PCP: [name], MD  Brief narrative: 80 y.o. female with hypertension, atrial fibrillation on chronic anticoagulation, rheumatoid arthritis on chronic prednisone, chronic kidney disease stage III, and osteoporosis on calcium and vitamin D, who presented to [facility] [place] ED with with progressive weakness one week in duration, associated with nausea and poor oral intake, generalized headache, several episodes of small amount of nose bleeds which all resolved spontaneously. In ED, pt noted to have UTI, was hypercalcemic > 15, ARF with Cr 1.62. [facility] asked to admit  For further evaluation. Denied any chest pain, shortness of breath, abdominal pain, diarrhea, or vision changes.   Assessment and Plan:    Principal Problem:   Generalized weakness Eventual SNF  Active Problems:   Hypercalcemia CT C/A/P without malignancy. For bone marrow biopsy. SPEP pending. Skeletal survey negative  Fever: blood cultures pending. Repeat ua improved. Will also get urine culture. CT chest with ground glass opacities, but no cough. If continues, will repeat CXR. Malignancy related?    Acute on chronic renal failure Continue IVF. Renal following    RA (rheumatoid arthritis) - continue Prednisone. MTX stopped    Hypertension Stable    Atrial fibrillation - rate controlled     UTI (urinary tract infection), E coli On abx day #6  Neutropenia MTX held for BMBx    Hypokalemia/Hypomagnesemia  Corrected  Anemia: panel consistent with chronic disease    Severe protein calorie malnutrition Add ensure. Dietitian consult  Constipation: laxatives  Code Status: Full Family Communication: multiple at bedside Disposition Plan: SNF when stable  Medical Consultants:   Nephrology  oncology Anti-Infectives:    Cephalexin    LOS: 6 days   HPI/Subjective: Denies cough. No BM since admission. Weak.  Objective: Filed Vitals:   04/19/14 2033 04/20/14 0508 04/20/14 0637 04/20/14 0958  BP: 106/48 126/55  108/48  Pulse: 68 71  63  Temp: 98.8 F (37.1 C) 102.7 F (39.3 C) 100 F (37.8 C) 99.2 F (37.3 C)  TempSrc:  Oral  Oral  Resp: _0 Height:      Weight: 43.545 kg (96 lb)     SpO2: 96% 96%  98%    Intake/Output Summary (Last 24 hours) at 04/20/14 1410 Last data filed at 04/20/14 0900  Gross per 24 hour  Intake   1320 ml  Output   1475 ml  Net   -155 ml    Exam:   General:  Weak appearing. frail  Cardiovascular: Regular rate and rhythm, no rubs, no gallops  Respiratory: Clear to auscultation bilaterally, no wheezing, no crackles, no rhonchi  Abdomen: Soft, non tender, non distended, bowel sounds present, no guarding  Ext no CCE  Data Reviewed: Basic Metabolic Panel:  Recent Labs Lab 04/16/14 0510 04/17/14 0750 04/18/14 0750 04/19/14 0530 04/19/14 0538 04/20/14 0500 04/20/14 0550  NA 143 144 139 139  --   --  138  K 3.6* 3.3* 3.6* 3.6*  --   --  4.0  CL 104 105 100 100  --   --  103  CO2 _1 --   --  19  GLUCOSE 152* 119* 97 96  --   --  89  BUN 45* 42* 44* 51*  --   --  44*  CREATININE 1.90* 2.03* 2.08* 2.01*  --  2.01* 1.69*  CALCIUM 12.2* 10.4 11.2* 10.6*  --   --  9.1  MG  --   --  1.4*  --  1.5  --  1.7  PHOS  --   --  2.6 2.6  --   --  2.0*   Liver Function Tests:  Recent Labs Lab 04/15/14 0430 04/15/14 1818 04/16/14 0510 04/17/14 0750 04/18/14 0750 04/19/14 0530 04/20/14 0550  AST 21 27 23 23 32  --   --   ALT 14 15 14 14 21  --   --   ALKPHOS 48 50 47 44 51  --   --   BILITOT 0.5 0.4 0.3 0.2* 0.2*  --   --   PROT 6.2 6.2 6.1 6.0 7.0  --   --   ALBUMIN 2.7* 2.6* 2.6* 2.6* 3.0* 2.8* 2.3*   CBC:  Recent Labs Lab 04/15/14 0430 04/16/14 0510 04/17/14 0750 04/18/14 0750 04/20/14 0550  WBC 2.4* 1.5* 2.4* 2.0* 1.6*   NEUTROABS  --   --   --   --  0.7*  HGB 8.4* 8.4* 8.2* 8.8* 7.6*  HCT 26.0* 25.5* 24.4* 26.7* 23.2*  MCV 90.3 89.2 91.4 91.4 92.1  PLT 470* 381 262 231 195   Cardiac Enzymes:  Recent Labs Lab 04/19/14 0538  CKTOTAL 32   Recent Results (from the past 240 hour(s))  URINE CULTURE     Status: None   Collection Time    04/14/14 11:09 PM      Result Value Ref Range Status   Specimen Description URINE, CATHETERIZED   Final   Special Requests CX ADDED AT 0037 ON [id number]   Final   Culture  Setup Time     Final   Value: 04/15/2014 01:09     Performed at [facility]   Colony Count     Final   Value: >=100,000 COLONIES/ML     Performed at [facility]   Culture     Final   Value: ESCHERICHIA COLI     Performed at [facility]   Report Status 04/17/2014 FINAL   Final   Organism ID, Bacteria ESCHERICHIA COLI   Final     Scheduled Meds: . cephALEXin  500 mg Oral Q12H  . docusate sodium  100 mg Oral BID  . enoxaparin injection  40 mg Subcutaneous Q24H  . folic acid  1 mg Oral Daily  . oxybutynin  10 mg Oral Daily  . pamidronate  30 mg Intravenous Once  . predniSONE  10 mg Oral QODAY  . predniSONE  5 mg Oral QODAY  . simvastatin  20 mg Oral QPM   Continuous Infusions: . sodium chloride 100 mL/hr at 04/19/14 2245    , MD [facility] [phone] 

## 2014-04-21 ENCOUNTER — Inpatient Hospital Stay (HOSPITAL_COMMUNITY): Payer: Medicare Other

## 2014-04-21 DIAGNOSIS — A498 Other bacterial infections of unspecified site: Secondary | ICD-10-CM

## 2014-04-21 DIAGNOSIS — M069 Rheumatoid arthritis, unspecified: Secondary | ICD-10-CM

## 2014-04-21 DIAGNOSIS — D61818 Other pancytopenia: Secondary | ICD-10-CM

## 2014-04-21 LAB — CBC WITH DIFFERENTIAL/PLATELET
BASOS ABS: 0 10*3/uL (ref 0.0–0.1)
Basophils Relative: 1 % (ref 0–1)
EOS ABS: 0.1 10*3/uL (ref 0.0–0.7)
Eosinophils Relative: 5 % (ref 0–5)
HCT: 23 % — ABNORMAL LOW (ref 36.0–46.0)
Hemoglobin: 7.7 g/dL — ABNORMAL LOW (ref 12.0–15.0)
LYMPHS PCT: 28 % (ref 12–46)
Lymphs Abs: 0.5 10*3/uL — ABNORMAL LOW (ref 0.7–4.0)
MCH: 30 pg (ref 26.0–34.0)
MCHC: 33.5 g/dL (ref 30.0–36.0)
MCV: 89.5 fL (ref 78.0–100.0)
MONO ABS: 0.2 10*3/uL (ref 0.1–1.0)
Monocytes Relative: 11 % (ref 3–12)
Neutro Abs: 0.9 10*3/uL — ABNORMAL LOW (ref 1.7–7.7)
Neutrophils Relative %: 55 % (ref 43–77)
Platelets: 242 10*3/uL (ref 150–400)
RBC: 2.57 MIL/uL — ABNORMAL LOW (ref 3.87–5.11)
RDW: 17.1 % — AB (ref 11.5–15.5)
WBC: 1.7 10*3/uL — ABNORMAL LOW (ref 4.0–10.5)

## 2014-04-21 LAB — RENAL FUNCTION PANEL
ALBUMIN: 2.1 g/dL — AB (ref 3.5–5.2)
ANION GAP: 13 (ref 5–15)
BUN: 38 mg/dL — ABNORMAL HIGH (ref 6–23)
CO2: 21 mEq/L (ref 19–32)
Calcium: 8.7 mg/dL (ref 8.4–10.5)
Chloride: 108 mEq/L (ref 96–112)
Creatinine, Ser: 1.33 mg/dL — ABNORMAL HIGH (ref 0.50–1.10)
GFR calc Af Amer: 43 mL/min — ABNORMAL LOW (ref >90)
GFR calc non Af Amer: 37 mL/min — ABNORMAL LOW (ref >90)
Glucose, Bld: 77 mg/dL (ref 70–99)
PHOSPHORUS: 2.1 mg/dL — AB (ref 2.3–4.6)
Potassium: 3.8 mEq/L (ref 3.7–5.3)
Sodium: 142 mEq/L (ref 137–147)

## 2014-04-21 MED ORDER — CIPROFLOXACIN HCL 500 MG PO TABS
500.0000 mg | ORAL_TABLET | Freq: Two times a day (BID) | ORAL | Status: DC
  Administered 2014-04-21 – 2014-04-23 (×5): 500 mg via ORAL
  Filled 2014-04-21 (×7): qty 1

## 2014-04-21 NOTE — Progress Notes (Signed)
Admit: 04/14/2014 LOS: 7  39F admitted with hypercalcemia (>15) w/ suppressed PTH, absent PTHrP, and elevated 1,25 OH Vit D.  CT CAP (no IV contrast) 9/25 negative.  S/p pamidronate 30mg  IV x1 9/25/152  Subjective:  Hematology consult yesterday, appreciate For BM Bx tomorrow Quantiferon Goldpending SPEP and sFLC pending; skeletal survey negative 04/18/14 Serum creatinine improved. Calcium improved s/p pamidronate 04/19/14 BM yesterday    09/26 0701 - 09/27 0700 In: 1560 [P.O.:360; I.V.:1200] Out: 1025 [Urine:1025]  Filed Weights   04/19/14 0500 04/19/14 2033 04/20/14 2100  Weight: 42.32 kg (93 lb 4.8 oz) 43.545 kg (96 lb) 45.405 kg (100 lb 1.6 oz)    Scheduled Meds: . antiseptic oral rinse  7 mL Mouth Rinse BID  . cephALEXin  500 mg Oral Q12H  . enoxaparin (LOVENOX) injection  40 mg Subcutaneous Q24H  . feeding supplement (ENSURE COMPLETE)  237 mL Oral TID BM  . folic acid  1 mg Oral Daily  . oxybutynin  10 mg Oral Daily  . polyethylene glycol  17 g Oral BID  . predniSONE  10 mg Oral QODAY  . predniSONE  5 mg Oral QODAY  . simvastatin  20 mg Oral QPM   Continuous Infusions:   PRN Meds:.acetaminophen, acetaminophen, bisacodyl, HYDROcodone-acetaminophen, ondansetron (ZOFRAN) IV, ondansetron, sodium phosphate  Current Labs: reviewed   Physical Exam:  Blood pressure 85/70, pulse 65, temperature 102.6 F (39.2 C), temperature source Oral, resp. rate 18, height 4\' 10"  (1.473 m), weight 45.405 kg (100 lb 1.6 oz), SpO2 98.00%. Frail thin elderly female, answers questions RRR CTAB S/nt/nd No LEE No rashes/lesions  Assessment 1. Hypercalcemia and Hypervitaminosis D; suspect some endogenous source of Vit D generation: CT CAP pelvis w/o LAN or clear evidence of lymphoma, ACE and CXR not suggestive of sarcoid; Tb testing pending.  S/p pamidronate 04/19/14 after tachyphylaxis of calcitonin.  On Hydration still.  On chronic prednisone.  TSH was WNL.  SPEP and sFLC still pending, but  doubt direct bony destruction.   2. AKI 2/2 ATN; nonoliguric, improving, 2/2 #1 3. Leukopenia, relative eosinophilia 4. HTN, mild, stable 5. AFib, on xarelto outpt, LMWH as inpt 6. RA on chronic MTX + Prednisone; stable  Plan 1. Await remaining studies 2. For BM Bx 04/22/14 3. Appreciate hematology assistance 4. Stop IVFs, eating and drinking now,  Ca normalized.  GFR improved  04/21/14 MD 04/21/2014, 8:42 AM   Recent Labs Lab 04/19/14 0530 04/20/14 0500 04/20/14 0550 04/21/14 0622  NA 139  --  138 142  K 3.6*  --  4.0 3.8  CL 100  --  103 108  CO2 24  --  19 21  GLUCOSE 96  --  89 77  BUN 51*  --  44* 38*  CREATININE 2.01* 2.01* 1.69* 1.33*  CALCIUM 10.6*  --  9.1 8.7  PHOS 2.6  --  2.0* 2.1*    Recent Labs Lab 04/18/14 0750 04/20/14 0550 04/21/14 0622  WBC 2.0* 1.6* 1.7*  NEUTROABS  --  0.7* 0.9*  HGB 8.8* 7.6* 7.7*  HCT 26.7* 23.2* 23.0*  MCV 91.4 92.1 89.5  PLT 231 195 242

## 2014-04-21 NOTE — Progress Notes (Signed)
SUBJECTIVE: Much more awake today. Has been having fevers intermittantly  OBJECTIVE PHYSICAL EXAMINATION: ECOG PERFORMANCE STATUS: 3 - Symptomatic, >50% confined to bed  Filed Vitals:   04/21/14 1136  BP: 107/44  Pulse: 74  Temp: 100.1 F (37.8 C)  Resp: 18   Filed Weights   04/19/14 0500 04/19/14 2033 04/20/14 2100  Weight: 93 lb 4.8 oz (42.32 kg) 96 lb (43.545 kg) 100 lb 1.6 oz (45.405 kg)   GENERAL:alert,  SKIN: few bruises EYES: normal, Conjunctiva are pink and non-injected, sclera clear OROPHARYNX:no exudate, LUNGS: clear to auscultation and percussion with normal breathing effort HEART: regular rate & rhythm and no murmurs and no lower extremity edema ABDOMEN:abdomen soft, non-tender and normal bowel sounds Musculoskeletal:no cyanosis of digits and no clubbing  NEURO: alert & oriented x 3 with fluent speech, no focal motor/sensory deficits  LABORATORY DATA:  I have reviewed the data as listed CMP     Component Value Date/Time   NA 142 04/21/2014 0622   K 3.8 04/21/2014 0622   CL 108 04/21/2014 0622   CO2 21 04/21/2014 0622   GLUCOSE 77 04/21/2014 0622   BUN 38* 04/21/2014 0622   CREATININE 1.33* 04/21/2014 0622   CREATININE 2.01* 04/20/2014 0500   CALCIUM 8.7 04/21/2014 0622   PROT 7.0 04/18/2014 0750   ALBUMIN 2.1* 04/21/2014 0622   AST 32 04/18/2014 0750   ALT 21 04/18/2014 0750   ALKPHOS 51 04/18/2014 0750   BILITOT 0.2* 04/18/2014 0750   GFRNONAA 37* 04/21/2014 0622   GFRAA 43* 04/21/2014 0622   Lab Results  Component Value Date   WBC 1.7* 04/21/2014   HGB 7.7* 04/21/2014   HCT 23.0* 04/21/2014   MCV 89.5 04/21/2014   PLT 242 04/21/2014   NEUTROABS 0.9* 04/21/2014    ASSESSMENT AND PLAN:  1. Fevers with ANC of 900: Urine showed E.Coli. Will start cipro 2. Pancytopenia: bone marrow biopsy pending 3. Hypercalcemia with High Vit D: improved after Pamidronate If she gets discharged after bone marrow, I will follow her as an out patient to go over the results I would  like bone marrow sent for Flow cytometry, Cytogenetics, MDS FISH panel Thanks

## 2014-04-21 NOTE — Progress Notes (Addendum)
Chart reviewed.  Patient ID: Zoe Robinson, female   DOB: 11/07/1931, 80 y.o.   MRN: [id number]  [facility] PROGRESS NOTE  Zoe Robinson MRN:[id number] DOB: 09/06/1931 DOA: 04/14/2014 PCP: HOFFMAN,BYRON, MD  Brief narrative: 80 y.o. female with hypertension, atrial fibrillation on chronic anticoagulation, rheumatoid arthritis on chronic prednisone, chronic kidney disease stage III, and osteoporosis on calcium and vitamin D, who presented to [facility] ED with with progressive weakness one week in duration, associated with nausea and poor oral intake, generalized headache, several episodes of small amount of nose bleeds which all resolved spontaneously. In ED, pt noted to have UTI, was hypercalcemic > 15, ARF with Cr 1.62. [facility] asked to admit  For further evaluation. Denied any chest pain, shortness of breath, abdominal pain, diarrhea, or vision changes.   Assessment and Plan:    Principal Problem:   Generalized weakness Eventual SNF  Active Problems:   Hypercalcemia CT C/A/P without malignancy. For bone marrow biopsy. SPEP pending. Skeletal survey negative. ACE ok.  Fever: had another this am. blood cultures pending. Repeat ua improved. Repeat urine culture pending. CT chest with ground glass opacities. Will repeat CXR r/o pneumonia. Malignancy related?    Acute on chronic renal failure Resolving.    RA (rheumatoid arthritis) - continue Prednisone. MTX stopped    Hypertension Stable    Atrial fibrillation - rate controlled     UTI (urinary tract infection), E coli On abx day #7. Will d/c  Neutropenia MTX held for BMBx    Hypokalemia/Hypomagnesemia  Corrected  Anemia: panel consistent with chronic disease    Severe protein calorie malnutrition Add ensure. Dietitian consult  Constipation: had stool yesterday  Code Status: Full Family Communication: multiple at bedside Disposition Plan: SNF when stable  Medical Consultants:   Nephrology   oncology Anti-Infectives:   Cephalexin    LOS: 7 days   HPI/Subjective: Some cough this morning. Eating ok. No other complaints  Objective: Filed Vitals:   04/20/14 2100 04/21/14 0455 04/21/14 0840 04/21/14 0845  BP: 92/33 110/54 85/70 92/53  Pulse: 69 65    Temp: 98.6 F (37 C) 98.2 F (36.8 C) 102.6 F (39.2 C)   TempSrc: Oral  Oral   Resp: 18 18    Height:      Weight: 45.405 kg (100 lb 1.6 oz)     SpO2: 98% 98%      Intake/Output Summary (Last 24 hours) at 04/21/14 1022 Last data filed at 04/21/14 0900  Gross per 24 hour  Intake   1800 ml  Output   1025 ml  Net    775 ml    Exam:   General:  Weak appearing. Frail. Brighter than yesterday.  Cardiovascular: Regular rate and rhythm, no rubs, no gallops  Respiratory: Clear to auscultation bilaterally, no wheezing, no crackles, no rhonchi  Abdomen: Soft, non tender, non distended, bowel sounds present, no guarding  Ext no CCE  Data Reviewed: Basic Metabolic Panel:  Recent Labs Lab 04/17/14 0750 04/18/14 0750 04/19/14 0530 04/19/14 0538 04/20/14 0500 04/20/14 0550 04/21/14 0622  NA 144 139 139  --   --  138 142  K 3.3* 3.6* 3.6*  --   --  4.0 3.8  CL 105 100 100  --   --  103 108  CO2 27 25 24  --   --  19 21  GLUCOSE 119* 97 96  --   --  89 77  BUN 42* 44* 51*  --   --  44* 38*    CREATININE 2.03* 2.08* 2.01*  --  2.01* 1.69* 1.33*  CALCIUM 10.4 11.2* 10.6*  --   --  9.1 8.7  MG  --  1.4*  --  1.5  --  1.7  --   PHOS  --  2.6 2.6  --   --  2.0* 2.1*   Liver Function Tests:  Recent Labs Lab 04/15/14 0430 04/15/14 1818 04/16/14 0510 04/17/14 0750 04/18/14 0750 04/19/14 0530 04/20/14 0550 04/21/14 0622  AST 21 27 23 23 32  --   --   --   ALT 14 15 14 14 21  --   --   --   ALKPHOS 48 50 47 44 51  --   --   --   BILITOT 0.5 0.4 0.3 0.2* 0.2*  --   --   --   PROT 6.2 6.2 6.1 6.0 7.0  --   --   --   ALBUMIN 2.7* 2.6* 2.6* 2.6* 3.0* 2.8* 2.3* 2.1*   CBC:  Recent Labs Lab 04/16/14 0510  04/17/14 0750 04/18/14 0750 04/20/14 0550 04/21/14 0622  WBC 1.5* 2.4* 2.0* 1.6* 1.7*  NEUTROABS  --   --   --  0.7* 0.9*  HGB 8.4* 8.2* 8.8* 7.6* 7.7*  HCT 25.5* 24.4* 26.7* 23.2* 23.0*  MCV 89.2 91.4 91.4 92.1 89.5  PLT 381 262 231 195 242   Cardiac Enzymes:  Recent Labs Lab 04/19/14 0538  CKTOTAL 32   Recent Results (from the past 240 hour(s))  URINE CULTURE     Status: None   Collection Time    04/14/14 11:09 PM      Result Value Ref Range Status   Specimen Description URINE, CATHETERIZED   Final   Special Requests CX ADDED AT 0037 ON [id number]   Final   Culture  Setup Time     Final   Value: 04/15/2014 01:09     Performed at [facility]   Colony Count     Final   Value: >=100,000 COLONIES/ML     Performed at [facility]   Culture     Final   Value: ESCHERICHIA COLI     Performed at [facility]   Report Status 04/17/2014 FINAL   Final   Organism ID, Bacteria ESCHERICHIA COLI   Final     Scheduled Meds: . cephALEXin  500 mg Oral Q12H  . docusate sodium  100 mg Oral BID  . enoxaparin injection  40 mg Subcutaneous Q24H  . folic acid  1 mg Oral Daily  . oxybutynin  10 mg Oral Daily  . pamidronate  30 mg Intravenous Once  . predniSONE  10 mg Oral QODAY  . predniSONE  5 mg Oral QODAY  . simvastatin  20 mg Oral QPM   Continuous Infusions:    , MD [facility] [phone] 

## 2014-04-22 LAB — CBC WITH DIFFERENTIAL/PLATELET
Basophils Absolute: 0 10*3/uL (ref 0.0–0.1)
Basophils Relative: 2 % — ABNORMAL HIGH (ref 0–1)
EOS ABS: 0.1 10*3/uL (ref 0.0–0.7)
Eosinophils Relative: 6 % — ABNORMAL HIGH (ref 0–5)
HCT: 22.5 % — ABNORMAL LOW (ref 36.0–46.0)
Hemoglobin: 7.5 g/dL — ABNORMAL LOW (ref 12.0–15.0)
Lymphocytes Relative: 33 % (ref 12–46)
Lymphs Abs: 0.7 10*3/uL (ref 0.7–4.0)
MCH: 30.6 pg (ref 26.0–34.0)
MCHC: 33.3 g/dL (ref 30.0–36.0)
MCV: 91.8 fL (ref 78.0–100.0)
MONO ABS: 0.3 10*3/uL (ref 0.1–1.0)
Monocytes Relative: 12 % (ref 3–12)
NEUTROS PCT: 47 % (ref 43–77)
Neutro Abs: 1.1 10*3/uL — ABNORMAL LOW (ref 1.7–7.7)
PLATELETS: 326 10*3/uL (ref 150–400)
RBC: 2.45 MIL/uL — ABNORMAL LOW (ref 3.87–5.11)
RDW: 17.1 % — ABNORMAL HIGH (ref 11.5–15.5)
WBC: 2.2 10*3/uL — ABNORMAL LOW (ref 4.0–10.5)

## 2014-04-22 LAB — KAPPA/LAMBDA LIGHT CHAINS
Kappa free light chain: 5.5 mg/dL — ABNORMAL HIGH (ref 0.33–1.94)
Kappa, lambda light chain ratio: 1.76 — ABNORMAL HIGH (ref 0.26–1.65)
Lambda free light chains: 3.12 mg/dL — ABNORMAL HIGH (ref 0.57–2.63)

## 2014-04-22 LAB — URINE CULTURE
Colony Count: NO GROWTH
Culture: NO GROWTH

## 2014-04-22 LAB — RENAL FUNCTION PANEL
Albumin: 2.1 g/dL — ABNORMAL LOW (ref 3.5–5.2)
Anion gap: 11 (ref 5–15)
BUN: 29 mg/dL — ABNORMAL HIGH (ref 6–23)
CALCIUM: 8.6 mg/dL (ref 8.4–10.5)
CO2: 25 mEq/L (ref 19–32)
CREATININE: 1.35 mg/dL — AB (ref 0.50–1.10)
Chloride: 101 mEq/L (ref 96–112)
GFR, EST AFRICAN AMERICAN: 42 mL/min — AB (ref >90)
GFR, EST NON AFRICAN AMERICAN: 36 mL/min — AB (ref >90)
Glucose, Bld: 92 mg/dL (ref 70–99)
Phosphorus: 1.7 mg/dL — ABNORMAL LOW (ref 2.3–4.6)
Potassium: 4.1 mEq/L (ref 3.7–5.3)
SODIUM: 137 meq/L (ref 137–147)

## 2014-04-22 LAB — QUANTIFERON TB GOLD ASSAY (BLOOD)
Mitogen value: 0.45 IU/mL
QUANTIFERON NIL VALUE: 0.19 [IU]/mL
TB ANTIGEN MINUS NIL VALUE: 0.01 [IU]/mL
TB Ag value: 0.2 IU/mL

## 2014-04-22 LAB — PATHOLOGIST SMEAR REVIEW

## 2014-04-22 LAB — TECHNOLOGIST SMEAR REVIEW

## 2014-04-22 MED ORDER — SODIUM CHLORIDE 0.9 % IV BOLUS (SEPSIS)
250.0000 mL | Freq: Once | INTRAVENOUS | Status: AC
  Administered 2014-04-22: 250 mL via INTRAVENOUS

## 2014-04-22 MED ORDER — ACETAMINOPHEN 325 MG PO TABS
650.0000 mg | ORAL_TABLET | Freq: Three times a day (TID) | ORAL | Status: DC
  Administered 2014-04-22 – 2014-04-23 (×3): 650 mg via ORAL
  Filled 2014-04-22 (×2): qty 2

## 2014-04-22 MED ORDER — ACETAMINOPHEN 325 MG PO TABS
325.0000 mg | ORAL_TABLET | Freq: Once | ORAL | Status: AC
  Administered 2014-04-22: 325 mg via ORAL
  Filled 2014-04-22: qty 1

## 2014-04-22 NOTE — Progress Notes (Signed)
[name]   Subjective:  Pt seen and examined in AM. She continues to have fevers with temp of 103.2 F last night. Blood cultures from 9/26 with no growth to date. She was given 250 NS bolus over 1 hour and tylenol with improvement to 100.4. She reports improved appetite and weakness with no dyspnea, cough, nausea, vomiting, abdominal pain, or lightheadedness . She has bilateral leg pain with no swelling. She continues to make good urine output (3.27L) via foley catheter with no urinary symptoms. She had a BM the day before. Her daughter reports that her methotrexate was increased in dosage a few months ago but has been on it for years. She is scheduled for bone marrow biopsy this AM.   Objective:   General: Resting comfortably in bed Cardiac: Normal rate and rhythm  Pulmonary: Anterior fields clear to ausculation  GI: Soft, non-tender, non-distended with normal BS Extremities: No LE edema. Tenderness to palpation of b/l lower extremities.   Vital signs in last 24 hours: Filed Vitals:   04/22/14 0147 04/22/14 0326 04/22/14 0609 04/22/14 0814  BP: 128/44 118/34 110/46   Pulse: 81 77 70   Temp: 103.1 F (39.5 C) 100.4 F (38 C) 99.6 F (37.6 C) 101.2 F (38.4 C)  TempSrc:      Resp: 15 16 15    Height:      Weight:      SpO2: 93% 95% 96%    Weight change: -4.8 oz (-0.136 kg)  Intake/Output Summary (Last 24 hours) at 04/22/14 0824 Last data filed at 04/22/14 0700  Gross per 24 hour  Intake   1210 ml  Output   3275 ml  Net  -2065 ml    Physical Exam:  Blood pressure 110/46, pulse 70, temperature 101.2 F (38.4 C), temperature source Oral, resp. rate 15, height 4' 10"  (1.473 m), weight 99 lb 12.8 oz (45.269 kg), SpO2 96.00%.  Labs:   Recent Labs Lab 04/16/14 0510 04/17/14 0750 04/18/14 0750 04/19/14 0530 04/20/14 0500 04/20/14 0550 04/21/14 0622 04/22/14 0510  NA 143 144 139 139  --  138 142 137  K 3.6* 3.3* 3.6* 3.6*  --  4.0 3.8 4.1   CL 104 105 100 100  --  103 108 101  CO2 28 27 25 24   --  19 21 25   GLUCOSE 152* 119* 97 96  --  89 77 92  BUN 45* 42* 44* 51*  --  44* 38* 29*  CREATININE 1.90* 2.03* 2.08* 2.01* 2.01* 1.69* 1.33* 1.35*  CALCIUM 12.2* 10.4 11.2* 10.6*  --  9.1 8.7 8.6  PHOS  --   --  2.6 2.6  --  2.0* 2.1* 1.7*     Recent Labs Lab 04/16/14 0510 04/17/14 0750 04/18/14 0750  04/20/14 0550 04/21/14 0622 04/22/14 0510  AST 23 23 32  --   --   --   --   ALT 14 14 21   --   --   --   --   ALKPHOS 47 44 51  --   --   --   --   BILITOT 0.3 0.2* 0.2*  --   --   --   --   PROT 6.1 6.0 7.0  --   --   --   --   ALBUMIN 2.6* 2.6* 3.0*  < > 2.3* 2.1* 2.1*  < > = values in this interval not displayed. No results found for this basename: LIPASE, AMYLASE,  in  the last 168 hours No results found for this basename: AMMONIA,  in the last 168 hours   Recent Labs Lab 04/18/14 0750 04/20/14 0550 04/21/14 0622 04/22/14 0510  WBC 2.0* 1.6* 1.7* 2.2*  NEUTROABS  --  0.7* 0.9* 1.1*  HGB 8.8* 7.6* 7.7* 7.5*  HCT 26.7* 23.2* 23.0* 22.5*  MCV 91.4 92.1 89.5 91.8  PLT 231 195 242 326    '@LABRCNTIP'$ (inr:5)  )  Recent Labs Lab 04/19/14 0538  CKTOTAL 32    No results found for this basename: GLUCAP,  in the last 168 hours   No results found for this basename: IRON, TIBC, TRANSFERRIN, FERRITIN,  in the last 168 hours  Studies/Results: Dg Chest Port 1 View  04/21/2014   CLINICAL DATA:  Fever and cough.  EXAM: PORTABLE CHEST - 1 VIEW  COMPARISON:  04/14/2014.  FINDINGS: Mediastinum and hilar structures normal. Cardiomegaly. Pulmonary vascularity is normal. No pleural effusion or pneumothorax. Contrast in the colon.  IMPRESSION: 1. Mild cardiomegaly.  No CHF. 2. No acute pulmonary disease.   Electronically Signed   By: Marcello Moores  Register   On: 04/21/2014 13:53      . antiseptic oral rinse  7 mL Mouth Rinse BID  . ciprofloxacin  500 mg Oral BID  . enoxaparin (LOVENOX) injection  40 mg Subcutaneous Q24H  .  feeding supplement (ENSURE COMPLETE)  237 mL Oral TID BM  . folic acid  1 mg Oral Daily  . oxybutynin  10 mg Oral Daily  . polyethylene glycol  17 g Oral BID  . predniSONE  10 mg Oral QODAY  . predniSONE  5 mg Oral QODAY  . simvastatin  20 mg Oral QPM     I  have reviewed scheduled and prn medications.   Background: Zoe Robinson is an 80 y.o. female with past medical history of hypertension, atrial fibrillation on xarelto, rheumatoid arthritis on methotrexate and prednisone, CKD Stage II, chronic anemia on oral iron replacement, and osteoporosis on oral calcium/vitamin D replacement who we are consulted for in regards to hypercalcemia (>15 on admission) and AKI on CKD stage II (baseline 0.8 with peak of 2.08).   Plan:  Calcitriol-mediated Chronic Hypercalcemia - Pt has had chronically elevated calcium since at least 04/2013 with corrected calcium of 11 at that time. On admission was >15 and has received 4 doses of calcitonin administration (9/21 &22) and 1 dose of pamidronate 30 mg on 9/25 since hospitalization. Today calcium 10.1 from 10.2 -10.5 this weekend, improved from 11.6 before pamidronate. Currently off IVF's (received 250 cc bolus last night) and reports good PO intake. Continue to hold calcium/vitamin D supplementation. Etiology unclear, work-up for multiple myeloma in progress, follow-up SPEP/UPEP with IFE, serum light chains  PTHrP is normal but 1,25 vitamin D levels are elevated suggesting extra-renal hydroxylase production due to possible granulomatous disease (sarcoid however ACE level was normal), TB (quantiferon in progress), lymphoma (LDH and uric acid levels normal, CT CAP normal), fungal disease. If work-up unrevealing consider uncontrolled RA (elevated ESR and CRP) which can increase alpha-1 hydroxylase activity, consider increasing prednisone dosage. Non-oliguric AKI on CKD Stage II - Pt with last Cr of 0.8 on 05/17/13 with GFR 77, currently 1.35 with GFR 36 with peak of  2.08 during hospitalization. Etiology most likely pre-renal azotemia (BUN/Cr ratio>20:1) due to volume depletion in setting of hypercalcemia and decreased PO intake for past week. Renal US normal on 9/23 and UA on admission with no proteinuria and trace hematuria (RBC 3-6). Continue daily  renal function panel monitoring, daily weight, strict I/O's, and avoidance of nephrotoxins (home valsartan). Encourage PO intake.  Hypokalemia/Hypomagnesemia - Resolved. K 4.1 this AM. Continue to monitor.  E. Coli UTI -  Currently on PO ciprofloxacin 500 mg BID (started 9/27). Pt is s/p ceftriaxone and PO keflex (7 days). Repeat UA and culture in setting of fevers. Hypertension - Currently normotensive. Hold home valsartan 320 mg daily.  Leukopenia -  WBC 2.2K, previously normal (7.5K) on 04/2013. ANC 1100. Pt at home on methotrexate biweekly for RA which was recently increased a few months ago that can cause anemia/leukopenia vs aplastic anemia. Malignancy also possible. Continue to hold home methotrexate. Pt to have bone marrow biopsy with flow cytometry, cytogenetics, and MDS FISH panel. Hematology/oncology following.   Chronic Normocytic Anemia - Hg 7.5 today. Anemia panel with elevated ferrritin (443) and low iron sat 23%. Last Hg 11.9 on 05/17/13. Pt at home on oral ferrous sulfate 325 mg daily. Etiology unclear, possibly methotrexate induced ( aplastic anemia). Pt to have bone marrow biopsy. Hematology/oncology following.   Rheumatoid Arthritis on immunosuppressant therapy - Pt on chronic prednisone 5-10 mg daily and methotrexate 7.5-10 mg biweekly. Hold methotrexate in setting of infection and bone marrow suppression. If work-up unrevealing possibly methotrexate induced bone marrow suppression (aplastic anemia). Consider anti-TNF therapy as outpatient. Pt currently on prednisone 5-10 daily.  Osteoporosis - Pt is on biyearly prolia (Aug 2014 and Feb 2015) and OTC calcium/vitamin D supplementation. Continue to hold  supplementation in setting of hypercalcemia.  Atrial Fibrillation on AC therapy - Pt at home on xarelto which she received on first two days of admission. She is currently on lovenox therapy. She is not on rate control therapy.   Juluis Mire, MD  PGY-II IMTS  Pager: [phone]  Attending note to follow

## 2014-04-22 NOTE — Progress Notes (Signed)
INITIAL NUTRITION ASSESSMENT  DOCUMENTATION CODES Per approved criteria  -Severe malnutrition in the context of chronic illness  Pt meets criteria for severe MALNUTRITION in the context of chronic illness as evidenced by severe fat and muscle wasting.  INTERVENTION: - Ensure Complete po TID, each supplement provides 350 kcal and 13 grams of protein - RD will continue to monitor.  NUTRITION DIAGNOSIS: Inadequate oral intake related to weakness as evidenced by weight loss and poor po intake.   Goal: Pt to meet >/= 90% of their estimated nutrition needs   Monitor:  Weight trend, po intake, acceptance of supplements  Reason for Assessment: Consult for poor po, MST  80 y.o. female  Admitting Dx: Generalized weakness  ASSESSMENT: 80 y.o. Caucasian female with history of hypertension, atrial fibrillation on chronic anticoagulation, rheumatoid arthritis, chronic kidney disease stage III, and osteoporosis on calcium and vitamin D who presents with weakness.  - Spoke with pt's daughter who provided history. - Pt had poor po intake at home. Her weight has fluctuated from 93 lbs to 106 lbs within the past year. Recently pt's weight has dropped several pounds. She is tolerating her dysphagia III diet when alert and awake. She is currently receiving Ensure Complete BID and is drinking 100% per report.   Nutrition Focused Physical Exam:  Subcutaneous Fat:  Orbital Region: severe wasting Upper Arm Region: moderate wasting Thoracic and Lumbar Region: moderate to severe wasting  Muscle:  Temple Region: severe wasting Clavicle Bone Region: severe wasting Clavicle and Acromion Bone Region: severe wasting Scapular Bone Region: moderate wasting Dorsal Hand: severe wasting Patellar Region: severe wasting Anterior Thigh Region: moderate wasting Posterior Calf Region: moderate wasting  Edema: none  Height: Ht Readings from Last 1 Encounters:  04/18/14 4\' 10"  (1.473 m)    Weight: Wt  Readings from Last 1 Encounters:  04/21/14 99 lb 12.8 oz (45.269 kg)    Ideal Body Weight: 40.9 kg  % Ideal Body Weight: 111%  Wt Readings from Last 10 Encounters:  04/21/14 99 lb 12.8 oz (45.269 kg)    Usual Body Weight: 93-106 lbs  % Usual Body Weight: 93-106%  BMI:  Body mass index is 20.86 kg/(m^2).  Estimated Nutritional Needs: Kcal: 1300-1500 Protein: 65-80 g Fluid: 1.5 L/day  Skin: Intact  Diet Order: Dysphagia  EDUCATION NEEDS: -Education needs addressed   Intake/Output Summary (Last 24 hours) at 04/22/14 1340 Last data filed at 04/22/14 1123  Gross per 24 hour  Intake   1087 ml  Output   3575 ml  Net  -2488 ml    Last BM: 9/26   Labs:   Recent Labs Lab 04/18/14 0750  04/19/14 0538  04/20/14 0550 04/21/14 0622 04/22/14 0510  NA 139  < >  --   --  138 142 137  K 3.6*  < >  --   --  4.0 3.8 4.1  CL 100  < >  --   --  103 108 101  CO2 25  < >  --   --  19 21 25   BUN 44*  < >  --   --  44* 38* 29*  CREATININE 2.08*  < >  --   < > 1.69* 1.33* 1.35*  CALCIUM 11.2*  < >  --   --  9.1 8.7 8.6  MG 1.4*  --  1.5  --  1.7  --   --   PHOS 2.6  < >  --   --  2.0* 2.1* 1.7*  GLUCOSE 97  < >  --   --  89 77 92  < > = values in this interval not displayed.  CBG (last 3)  No results found for this basename: GLUCAP,  in the last 72 hours  Scheduled Meds: . antiseptic oral rinse  7 mL Mouth Rinse BID  . ciprofloxacin  500 mg Oral BID  . enoxaparin (LOVENOX) injection  40 mg Subcutaneous Q24H  . feeding supplement (ENSURE COMPLETE)  237 mL Oral TID BM  . folic acid  1 mg Oral Daily  . oxybutynin  10 mg Oral Daily  . polyethylene glycol  17 g Oral BID  . predniSONE  10 mg Oral QODAY  . predniSONE  5 mg Oral QODAY  . simvastatin  20 mg Oral QPM    Continuous Infusions:   Past Medical History  Diagnosis Date  . Atrial fibrillation   . Arthritis   . Hypertension   . CKD (chronic kidney disease)   . RA (rheumatoid arthritis)     Past Surgical  History  Procedure Laterality Date  . Abdominal hysterectomy      Ebbie Latus RD, LDN

## 2014-04-22 NOTE — Progress Notes (Signed)
ANTICOAGULATION CONSULT NOTE - Follow Up Consult  Pharmacy Consult for Lovenox Indication: atrial fibrillation  No Known Allergies  Patient Measurements: Height: 4\' 10"  (147.3 cm) Weight: 99 lb 12.8 oz (45.269 kg) IBW/kg (Calculated) : 40.9  Vital Signs: Temp: 102.3 F (39.1 C) (09/28 1032) Temp src: Oral (09/28 1032) BP: 120/38 mmHg (09/28 1032) Pulse Rate: 83 (09/28 1032)  Labs:  Recent Labs  04/20/14 0550 04/21/14 0622 04/22/14 0510  HGB 7.6* 7.7* 7.5*  HCT 23.2* 23.0* 22.5*  PLT 195 242 326  CREATININE 1.69* 1.33* 1.35*    Estimated Creatinine Clearance: 21.8 ml/min (by C-G formula based on Cr of 1.35).   Assessment: 35 YOF who presented on 9/21 with symptomatic hypercalcemia and acute on chronic kidney disease. The patient was noted to be on Xarelto PTA for hx Afib however use is contraindicated given the patient's worsening renal function and the patient was transitioned to lovenox on 9/23 for bridging while Xarelto on hold.  Renal function has now improved - SCr 1.35, CrCl~20-25 ml/min. Xarelto could be resumed if needed however per discussion with Dr. 10/23 this morning, to continue lovenox for now. Hgb/Hct low but stable, plts wnl. No overt s/sx of bleeding noted.   Goal of Therapy:  Anti-Xa level 0.6-1 units/ml 4hrs after LMWH dose given Monitor platelets by anticoagulation protocol: Yes   Plan:  1. Continue Lovenox 40 mg SQ every 24 hours 2. Will continue to monitor for any signs/symptoms of bleeding and renal function for dose adjustments for lovenox and appropriateness to transition back to Xarelto  Lendell Caprice, PharmD, BCPS Clinical Pharmacist Pager: [phone] 04/22/2014 10:48 AM

## 2014-04-22 NOTE — Progress Notes (Signed)
Patient's oral temperature at 0055 was 103.2.  She was given 650 mg Tylenol PO at 0105.  At 0147, her oral temperature was 103.1.  [facility] contacted and advised.  Per new orders, gave patient 325 mg Tylenol PO at 0220 and infused 250 ml NS bolus over one hour.  At 0326, patient's temperature was 100.4.  VS remained stable.  Patient had no complaints.  Will continue to monitor patient.  Patient's daughter at bedside and is in agreement with proposed plan of care.  All questions answered.  Bernie Covey RN-BC, [employer]

## 2014-04-22 NOTE — Care Management Note (Signed)
CARE MANAGEMENT NOTE 04/22/2014  Patient:  PIA, JEDLICKA   Account Number:  [id number]  Date Initiated:  04/16/2014  Documentation initiated by:  Lizabeth Leyden  Subjective/Objective Assessment:   admitted with hypercalcemia, ARF,UTI     Action/Plan:   progression of care and discharge planning   Anticipated DC Date:  04/19/2014   Anticipated DC Plan:  [place]  CM consult      Choice offered to / List presented to:             Status of service:  In process, will continue to follow Medicare Important Message given?  YES (If response is "NO", the following Medicare IM given date fields will be blank) Date Medicare IM given:  04/18/2014 Medicare IM given by:  Judas Mohammad Date Additional Medicare IM given:  04/22/2014 Additional Medicare IM given by:  [facility]  Discharge Disposition:    Per UR Regulation:    If discussed at Long Length of Stay Meetings, dates discussed:    Comments:  04/22/2014 Met with pt and daughter, plan to d/c to SNF for rehab. Jasmine Pang RN MPH, case manager, [phone]

## 2014-04-22 NOTE — Consult Note (Signed)
Chief Complaint: Chief Complaint  Patient presents with  . Weakness  pancytopenia Elevated calcium level New fever  Referring Physician(s): Dr Conley Canal  History of Present Illness: Zoe Robinson is a 80 y.o. female  Pt is admitted with UTI- has since been treated Calcium level elevated over 15 Noted neutropenic; pancytopenic Weakness Scheduled now for bone marrow biopsy New high fever: 103 degrees CT and blood works all neg Discussed with Dr Earleen Newport and Dr Conley Canal Plan now for bx in am Off Xarelto since admission Lovenox held  Past Medical History  Diagnosis Date  . Atrial fibrillation   . Arthritis   . Hypertension   . CKD (chronic kidney disease)   . RA (rheumatoid arthritis)     Past Surgical History  Procedure Laterality Date  . Abdominal hysterectomy      Allergies: Review of patient's allergies indicates no known allergies.  Medications: Prior to Admission medications   Medication Sig Start Date End Date Taking? Authorizing Provider  acetaminophen (TYLENOL) 500 MG tablet Take 1,000 mg by mouth every 8 (eight) hours.   Yes Historical Provider, MD  Calcium Carb-Cholecalciferol (CALCIUM 600 + D PO) Take 2 tablets by mouth daily.    Yes Historical Provider, MD  ferrous sulfate 325 (65 FE) MG tablet Take 325 mg by mouth daily with breakfast.   Yes Historical Provider, MD  folic acid (FOLVITE) 1 MG tablet Take 1 mg by mouth daily.   Yes Historical Provider, MD  magnesium gluconate (MAGONATE) 500 MG tablet Take 500 mg by mouth daily.   Yes Historical Provider, MD  methotrexate (RHEUMATREX) 2.5 MG tablet Take 7.5-10 mg by mouth 2 (two) times a week. Take 10 mg by mouth on Thursday and take 7.5 mg by mouth on Friday.   Yes Historical Provider, MD  Multiple Vitamins-Minerals (MULTIVITAMIN PO) Take 1 tablet by mouth daily.   Yes Historical Provider, MD  oxybutynin (DITROPAN-XL) 10 MG 24 hr tablet Take 10 mg by mouth daily.   Yes Historical Provider, MD    predniSONE (DELTASONE) 5 MG tablet Take 5-10 mg by mouth every other day. Take 5 mg by mouth every other day alternating with 10 mg by mouth every other day.   Yes Historical Provider, MD  Rivaroxaban (XARELTO) 15 MG TABS tablet Take 15 mg by mouth daily.   Yes Historical Provider, MD  simvastatin (ZOCOR) 40 MG tablet Take 20 mg by mouth every evening.   Yes Historical Provider, MD  valsartan (DIOVAN) 320 MG tablet Take 320 mg by mouth daily.   Yes Historical Provider, MD  vitamin E 1000 UNIT capsule Take 1,000 Units by mouth daily.   Yes Historical Provider, MD    Family History  Problem Relation Age of Onset  . Heart attack Mother   . Heart attack Father   . Heart attack Brother   . Heart attack Brother   . Stomach cancer Brother     History   Social History  . Marital Status: Married    Spouse Name: N/A    Number of Children: N/A  . Years of Education: N/A   Social History Main Topics  . Smoking status: Former Smoker    Quit date: 04/15/1964  . Smokeless tobacco: None  . Alcohol Use: No  . Drug Use: No  . Sexual Activity: None   Other Topics Concern  . None   Social History Narrative  . None    Review of Systems: A 12 point ROS discussed and pertinent positives  are indicated in the HPI above.  All other systems are negative.  Review of Systems  Constitutional: Positive for fever, activity change and fatigue.  Respiratory: Negative for cough, chest tightness and shortness of breath.   Cardiovascular: Negative for chest pain.  Gastrointestinal: Negative for nausea and abdominal pain.  Genitourinary: Negative for dysuria, urgency, frequency and difficulty urinating.  Skin: Negative for color change.  Neurological: Positive for weakness.  Psychiatric/Behavioral: Negative for confusion.    Vital Signs: BP 120/38  Pulse 83  Temp(Src) 102.3 F (39.1 C) (Oral)  Resp 15  Ht 4' 10" (1.473 m)  Wt 45.269 kg (99 lb 12.8 oz)  BMI 20.86 kg/m2  SpO2 95%  Physical  Exam  Constitutional: She is oriented to person, place, and time. She appears well-nourished.  Cardiovascular: Normal rate, regular rhythm and normal heart sounds.   No murmur heard. Pulmonary/Chest: Effort normal and breath sounds normal. She has no wheezes.  Abdominal: Soft. Bowel sounds are normal. She exhibits no distension.  Musculoskeletal: Normal range of motion.  Neurological: She is alert and oriented to person, place, and time.  Skin: Skin is warm and dry.  Psychiatric: She has a normal mood and affect. Her behavior is normal. Judgment and thought content normal.    Imaging: Ct Abdomen Pelvis Wo Contrast  04/19/2014   CLINICAL DATA:  Hypercalcemia, concern for malignancy.  EXAM: CT CHEST, ABDOMEN AND PELVIS WITHOUT CONTRAST  TECHNIQUE: Multidetector CT imaging of the chest, abdomen and pelvis was performed following the standard protocol without IV contrast.  COMPARISON:  Lumbar spine series 32415 and CT abdomen pelvis 05/17/2013.  FINDINGS: CT CHEST FINDINGS  Mediastinal lymph nodes measure up to 9 mm in the AP window. Hilar regions are difficult to definitively evaluate without IV contrast but appear grossly unremarkable. No axillary adenopathy. Atherosclerotic calcification of the arterial vasculature, including coronary arteries. Heart is at the upper limits of normal in size to mildly enlarged. Small amount of pericardial fluid may be physiologic. Slight thickening of the distal esophageal wall can be seen with gastroesophageal reflux disease. Retained oral contrast in the esophagus can be seen with dysmotility.  There are patchy areas of ground-glass in the apical and posterior segments of the right upper lobe. Minimal dependent atelectasis in the lower lobes. No pleural fluid. Airway is unremarkable.  CT ABDOMEN AND PELVIS FINDINGS  Hepatobiliary: Liver measures. There may be mild intrahepatic biliary duct dilatation. Common bile duct measures 6 mm. Stone debris layers in the  gallbladder.  Spleen: Negative.  Pancreas: Negative.  Adrenals/urinary tract: Adrenal glands and kidneys are unremarkable. Ureters are decompressed. Air and a Foley catheter are seen in a decompressed bladder.  Stomach/Bowel: Stomach and small bowel are unremarkable. Appendix is not readily visualized. A fair amount of stool is seen in the colon. Mesenteries and peritoneum are unremarkable.  Reproductive: Uterus appears absent.  Ovaries are poorly visualized.  Vascular/Lymphatic: Atherosclerotic calcification of the arterial vasculature without abdominal aortic aneurysm. No pathologically enlarged lymph nodes.  Other: No free fluid.  Musculoskeletal: No worrisome lytic or sclerotic lesions. Degenerative changes are seen in the spine. L1 compression fracture and associated retropulsion are unchanged from 10/16/2013.  IMPRESSION: 1. No findings to explain the patient's hypercalcemia. 2. Coronary artery calcification. 3. Patchy areas of ground-glass in the right upper lobe are likely infectious or inflammatory in etiology. 4. Cholelithiasis.   Electronically Signed   By: Lorin Picket M.D.   On: 04/19/2014 16:12   Dg Chest 2 View  04/15/2014  CLINICAL DATA:  Weakness for 1 week, headache, jaundice  EXAM: CHEST  2 VIEW  COMPARISON:  None.  FINDINGS: Mild cardiac enlargement. Vascular pattern normal. No consolidation or effusion. Severe L1 compression deformity, age uncertain.  IMPRESSION: Mild cardiac enlargement.  L1 compression deformity.   Electronically Signed   By: Skipper Cliche M.D.   On: 04/15/2014 00:19   Ct Chest Wo Contrast  04/19/2014   CLINICAL DATA:  Hypercalcemia, concern for malignancy.  EXAM: CT CHEST, ABDOMEN AND PELVIS WITHOUT CONTRAST  TECHNIQUE: Multidetector CT imaging of the chest, abdomen and pelvis was performed following the standard protocol without IV contrast.  COMPARISON:  Lumbar spine series 32415 and CT abdomen pelvis 05/17/2013.  FINDINGS: CT CHEST FINDINGS  Mediastinal lymph  nodes measure up to 9 mm in the AP window. Hilar regions are difficult to definitively evaluate without IV contrast but appear grossly unremarkable. No axillary adenopathy. Atherosclerotic calcification of the arterial vasculature, including coronary arteries. Heart is at the upper limits of normal in size to mildly enlarged. Small amount of pericardial fluid may be physiologic. Slight thickening of the distal esophageal wall can be seen with gastroesophageal reflux disease. Retained oral contrast in the esophagus can be seen with dysmotility.  There are patchy areas of ground-glass in the apical and posterior segments of the right upper lobe. Minimal dependent atelectasis in the lower lobes. No pleural fluid. Airway is unremarkable.  CT ABDOMEN AND PELVIS FINDINGS  Hepatobiliary: Liver measures. There may be mild intrahepatic biliary duct dilatation. Common bile duct measures 6 mm. Stone debris layers in the gallbladder.  Spleen: Negative.  Pancreas: Negative.  Adrenals/urinary tract: Adrenal glands and kidneys are unremarkable. Ureters are decompressed. Air and a Foley catheter are seen in a decompressed bladder.  Stomach/Bowel: Stomach and small bowel are unremarkable. Appendix is not readily visualized. A fair amount of stool is seen in the colon. Mesenteries and peritoneum are unremarkable.  Reproductive: Uterus appears absent.  Ovaries are poorly visualized.  Vascular/Lymphatic: Atherosclerotic calcification of the arterial vasculature without abdominal aortic aneurysm. No pathologically enlarged lymph nodes.  Other: No free fluid.  Musculoskeletal: No worrisome lytic or sclerotic lesions. Degenerative changes are seen in the spine. L1 compression fracture and associated retropulsion are unchanged from 10/16/2013.  IMPRESSION: 1. No findings to explain the patient's hypercalcemia. 2. Coronary artery calcification. 3. Patchy areas of ground-glass in the right upper lobe are likely infectious or inflammatory in  etiology. 4. Cholelithiasis.   Electronically Signed   By: Lorin Picket M.D.   On: 04/19/2014 16:12   US Renal  04/17/2014   CLINICAL DATA:  Acute kidney injury and renal insufficiency.  EXAM: RENAL/URINARY TRACT ULTRASOUND COMPLETE  COMPARISON:  None.  FINDINGS: Right Kidney:  Length: 9.2 cm. Echogenicity within normal limits. No mass or hydronephrosis visualized.  Left Kidney:  Length: 8.9 cm. Echogenicity within normal limits. No mass or hydronephrosis visualized.  Bladder:  Appears normal for degree of bladder distention.  IMPRESSION: Normal renal ultrasound.   Electronically Signed   By: Aletta Edouard M.D.   On: 04/17/2014 15:38   Dg Chest Port 1 View  04/21/2014   CLINICAL DATA:  Fever and cough.  EXAM: PORTABLE CHEST - 1 VIEW  COMPARISON:  04/14/2014.  FINDINGS: Mediastinum and hilar structures normal. Cardiomegaly. Pulmonary vascularity is normal. No pleural effusion or pneumothorax. Contrast in the colon.  IMPRESSION: 1. Mild cardiomegaly.  No CHF. 2. No acute pulmonary disease.   Electronically Signed   By: Marcello Moores  Register  On: 04/21/2014 13:53   Dg Bone Survey Met  04/19/2014   CLINICAL DATA:  Hypercalcemia.  Concern for multiple myeloma.  EXAM: METASTATIC BONE SURVEY  COMPARISON:  CT, 05/17/2013.  FINDINGS: There are no lytic/lucent bone lesions to suggest multiple myeloma. There are no osteoblastic lesions.  Bones are diffusely demineralized.  Moderate burst fracture of L1, unchanged from the prior CT. No other fractures.  IMPRESSION: 1. No evidence of multiple myeloma. No evidence of neoplastic disease to bone. 2. Chronic burst fracture of L1.  Diffuse bony demineralization.   Electronically Signed   By: Lajean Manes M.D.   On: 04/19/2014 08:43    Labs: Lab Results  Component Value Date   WBC 2.2* 04/22/2014   HCT 22.5* 04/22/2014   MCV 91.8 04/22/2014   PLT 326 04/22/2014   NA 137 04/22/2014   K 4.1 04/22/2014   CL 101 04/22/2014   CO2 25 04/22/2014   GLUCOSE 92 04/22/2014   BUN  29* 04/22/2014   CREATININE 1.35* 04/22/2014   CALCIUM 8.6 04/22/2014   PROT 7.0 04/18/2014   ALBUMIN 2.1* 04/22/2014   AST 32 04/18/2014   ALT 21 04/18/2014   ALKPHOS 51 04/18/2014   BILITOT 0.2* 04/18/2014   GFRNONAA 36* 04/22/2014   GFRAA 42* 04/22/2014    Assessment and Plan:  Neutropenia Pancytopenia New fevers--?lymphoma  elevated calcium--now better Scheduled for bone marrow bx 9/29 am Pt and dtr aware of procedure beneifts and risks and agreeable to proceed Consent signed andin chart  Thank you for this interesting consult.  I greatly enjoyed meeting [employer] and look forward to participating in their care.   I spent a total of 30 minutes face to face in clinical consultation, greater than 50% of which was counseling/coordinating care for bone marrow biopsy  Signed: Dhruti Ghuman A 04/22/2014, 2:59 PM

## 2014-04-22 NOTE — Progress Notes (Signed)
Physical Therapy Treatment Patient Details Name: Zoe Robinson MRN: [id number] DOB: 1931/11/27 Today's Date: 04/22/2014    History of Present Illness Zoe Robinson is a 80 y.o. Caucasian female with history of hypertension, atrial fibrillation on chronic anticoagulation, rheumatoid arthritis, chronic kidney disease stage III, and osteoporosis on calcium and vitamin D who presents with generalized weakness; She was found to have a urinary tract infection, hypercalcemic, and acute renal failure    PT Comments    Continuing progress with functional mobility with noted better RW handling with amb greater distance; Noted plans for dc to SNF for rehab  Follow Up Recommendations  SNF;Supervision/Assistance - 24 hour ( to maximize independence and safety with mobility prior to dc home )     Equipment Recommendations  Rolling walker with 5" wheels;3in1 (PT)    Recommendations for Other Services       Precautions / Restrictions Precautions Precautions: Fall    Mobility  Bed Mobility                  Transfers Overall transfer level: Needs assistance Equipment used: Rolling walker (2 wheeled) Transfers: Sit to/from Stand Sit to Stand: Min guard         General transfer comment: Cues for hand placement and safety  Ambulation/Gait Ambulation/Gait assistance: Min guard (without physical contact) Ambulation Distance (Feet): 200 Feet Assistive device: Rolling walker (2 wheeled) Gait Pattern/deviations: Step-through pattern;Decreased stride length Gait velocity: slowed   General Gait Details: Noted improved RW; did not run into obstacles during walk today   Information systems manager [place] (Stroke Patients Only)       Balance                                    Cognition Arousal/Alertness: Awake/alert Behavior During Therapy: WFL for tasks assessed/performed Overall Cognitive Status: Impaired/Different from  baseline (though improved today from last week)                 General Comments: Daughter reports pt has been confusing her meds and does not eat or drink much at all.  Daughter was concerned with pt staying by herself prior to admission.      Exercises      General Comments        Pertinent Vitals/Pain Pain Assessment: No/denies pain    Home Living                      Prior Function            PT Goals (current goals can now be found in the care plan section) Acute Rehab PT Goals Patient Stated Goal: To walk today PT Goal Formulation: With patient Time For Goal Achievement: 04/29/14 Potential to Achieve Goals: Good Progress towards PT goals: Progressing toward goals    Frequency  Min 3X/week    PT Plan Discharge plan needs to be updated    Co-evaluation             End of Session   Activity Tolerance: Patient tolerated treatment well Patient left: in chair;with call bell/phone within reach;with chair alarm set;with nursing/sitter in room     Time: 5784-6962 PT Time Calculation (min): 14 min  Charges:  $Gait Training: 8-22 mins  G Codes:      Van Clines [facility] 04/22/2014, 4:49 PM  Van Clines, Ray  Acute Rehabilitation Services Pager [phone] Office [phone]

## 2014-04-22 NOTE — Progress Notes (Signed)
Discussed with Dr. Jacqualyn Posey.   Patient ID: Zoe Robinson, female   DOB: [birth year], 80 y.o.   MRN: [id number]  [facility] PROGRESS NOTE  Zoe Robinson MWU:[id number] DOB: 09/28/1931 DOA: 04/14/2014 PCP: [name], MD  Brief narrative: 80 y.o. female with hypertension, atrial fibrillation on chronic anticoagulation, rheumatoid arthritis on chronic prednisone, chronic kidney disease stage III, and osteoporosis on calcium and vitamin D, who presented to [facility] ED with with progressive weakness one week in duration, associated with nausea and poor oral intake, generalized headache, several episodes of small amount of nose bleeds which all resolved spontaneously. In ED, pt noted to have UTI, was hypercalcemic > 15, ARF with Cr 1.62. TR[facility] asked to admit  For further evaluation. Denied any chest pain, shortness of breath, abdominal pain, diarrhea, or vision changes.   Assessment and Plan:    Principal Problem:   Generalized weakness Eventual SNF  Active Problems:   Hypercalcemia CT C/A/P without malignancy. For bone marrow biopsy. SPEP pending. Skeletal survey negative. ACE ok. Interferon gold pending  Fever: had another this am. blood cultures pending. Repeat ua improved. Repeat urine culture pending. CT chest with ground glass opacities. Repeat CXR negative. No cough. Blood cultures negative to date. Repeat urine cx negative. On cipro per hematology for neutoropenic fever.  Malignancy related?  Will also send bmbx for fungal and AFB smear and culture    Acute on chronic renal failure Resolving.    RA (rheumatoid arthritis) - continue Prednisone. MTX stopped    Hypertension Stable    Atrial fibrillation - rate controlled. Hold lovenox for bmbx    UTI (urinary tract infection), E coli Completed course of ceftriaxone and cephalexin  Neutropenia MTX held. for BMBx    Hypokalemia/Hypomagnesemia  Corrected  Anemia: panel consistent with chronic disease    Severe protein  calorie malnutrition Add ensure. Dietitian consult  Code Status: Full Family Communication: daughter Disposition Plan: SNF when stable  Medical Consultants:   Nephrology  oncology Anti-Infectives:   cipro   LOS: 8 days   HPI/Subjective: No cough. No dyspnea. Appetite improving. No dysuria, rash, diarrhea  Objective: Filed Vitals:   04/22/14 0609 04/22/14 0814 04/22/14 0900 04/22/14 1032  BP: 110/46  122/70 120/38  Pulse: 70  89 83  Temp: 99.6 F (37.6 C) 101.2 F (38.4 C) 103 F (39.4 C) 102.3 F (39.1 C)  TempSrc:   Oral Oral  Resp: _0 Height:      Weight:      SpO2: 96%  96% 95%    Intake/Output Summary (Last 24 hours) at 04/22/14 1508 Last data filed at 04/22/14 1445  Gross per 24 hour  Intake   1207 ml  Output   3025 ml  Net  -1818 ml    Exam:   General:  In chair. Alert, oriented, bright  Cardiovascular: Regular rate and rhythm, no rubs, no gallops  Respiratory: Clear to auscultation bilaterally, no wheezing, no crackles, no rhonchi  Abdomen: Soft, non tender, non distended, bowel sounds present, no guarding  Ext no CCE  Data Reviewed: Basic Metabolic Panel:  Recent Labs Lab 04/17/14 0750 04/18/14 0750 04/19/14 0530 04/19/14 0538 04/20/14 0500 04/20/14 0550 04/21/14 0622 04/22/14 0510  NA 144 139 139  --   --  138 142 137  K 3.3* 3.6* 3.6*  --   --  4.0 3.8 4.1  CL 105 100 100  --   --  103 108 101  CO2 _1 --   --  _0 GLUCOSE 119* 97 96  --   --  89 77 92  BUN 42* 44* 51*  --   --  44* 38* 29*  CREATININE 2.03* 2.08* 2.01*  --  2.01* 1.69* 1.33* 1.35*  CALCIUM 10.4 11.2* 10.6*  --   --  9.1 8.7 8.6  MG  --  1.4*  --  1.5  --  1.7  --   --   PHOS  --  2.6 2.6  --   --  2.0* 2.1* 1.7*   Liver Function Tests:  Recent Labs Lab 04/15/14 1818 04/16/14 0510 04/17/14 0750 04/18/14 0750 04/19/14 0530 04/20/14 0550 04/21/14 0622 04/22/14 0510  AST _1 32  --   --   --   --   ALT _2 --   --    --   --   ALKPHOS 50 47 44 51  --   --   --   --   BILITOT 0.4 0.3 0.2* 0.2*  --   --   --   --   PROT 6.2 6.1 6.0 7.0  --   --   --   --   ALBUMIN 2.6* 2.6* 2.6* 3.0* 2.8* 2.3* 2.1* 2.1*   CBC:  Recent Labs Lab 04/17/14 0750 04/18/14 0750 04/20/14 0550 04/21/14 0622 04/22/14 0510  WBC 2.4* 2.0* 1.6* 1.7* 2.2*  NEUTROABS  --   --  0.7* 0.9* 1.1*  HGB 8.2* 8.8* 7.6* 7.7* 7.5*  HCT 24.4* 26.7* 23.2* 23.0* 22.5*  MCV 91.4 91.4 92.1 89.5 91.8  PLT 262 231 195 242 326   Cardiac Enzymes:  Recent Labs Lab 04/19/14 0538  CKTOTAL 32   Recent Results (from the past 240 hour(s))  URINE CULTURE     Status: None   Collection Time    04/14/14 11:09 PM      Result Value Ref Range Status   Specimen Description URINE, CATHETERIZED   Final   Special Requests CX ADDED AT 0037 ON [id number]   Final   Culture  Setup Time     Final   Value: 04/15/2014 01:09     Performed at [place]     Final   Value: >=100,000 COLONIES/ML     Performed at [facility]   Culture     Final   Value: ESCHERICHIA COLI     Performed at [facility]   Report Status 04/17/2014 FINAL   Final   Organism ID, Bacteria ESCHERICHIA COLI   Final  CULTURE, BLOOD (ROUTINE X 2)     Status: None   Collection Time    04/20/14  5:50 AM      Result Value Ref Range Status   Specimen Description BLOOD RIGHT ARM   Final   Special Requests BOTTLES DRAWN AEROBIC ONLY 5CC   Final   Culture  Setup Time     Final   Value: 04/20/2014 14:51     Performed at [facility]   Culture     Final   Value:        BLOOD CULTURE RECEIVED NO GROWTH TO DATE CULTURE WILL BE HELD FOR 5 DAYS BEFORE ISSUING A FINAL NEGATIVE REPORT     Performed at [facility]   Report Status PENDING   Incomplete  CULTURE, BLOOD (ROUTINE X 2)     Status: None   Collection Time    04/20/14  5:55 AM      Result Value Ref Range Status   Specimen Description BLOOD RIGHT ARM   Final   Special Requests  BOTTLES DRAWN AEROBIC ONLY 10CC   Final   Culture  Setup Time     Final   Value: 04/20/2014 14:51     Performed at [facility]   Culture     Final   Value:        BLOOD CULTURE RECEIVED NO GROWTH TO DATE CULTURE WILL BE HELD FOR 5 DAYS BEFORE ISSUING A FINAL NEGATIVE REPORT     Performed at [facility]   Report Status PENDING   Incomplete  URINE CULTURE     Status: None   Collection Time    04/20/14  3:00 PM      Result Value Ref Range Status   Specimen Description URINE, CATHETERIZED   Final   Special Requests NONE   Final   Culture  Setup Time     Final   Value: 04/19/2014 16:27     Performed at [facility]     Final   Value: NO GROWTH     Performed at [facility]   Culture     Final   Value: NO GROWTH     Performed at [facility]   Report Status 04/22/2014 FINAL   Final     Scheduled Meds: . cephALEXin  500 mg Oral Q12H  . docusate sodium  100 mg Oral BID  . enoxaparin injection  40 mg Subcutaneous Q24H  . folic acid  1 mg Oral Daily  . oxybutynin  10 mg Oral Daily  . pamidronate  30 mg Intravenous Once  . predniSONE  10 mg Oral QODAY  . predniSONE  5 mg Oral QODAY  . simvastatin  20 mg Oral QPM   Continuous Infusions:   Doree Barthel, MD [facility] [phone]

## 2014-04-22 NOTE — Progress Notes (Signed)
I have seen and examined this patient and agree with the plan of care.   Zoe Robinson W 04/22/2014, 10:54 AM  

## 2014-04-23 ENCOUNTER — Encounter (HOSPITAL_COMMUNITY): Payer: Self-pay | Admitting: *Deleted

## 2014-04-23 ENCOUNTER — Ambulatory Visit (HOSPITAL_COMMUNITY): Payer: Medicare Other

## 2014-04-23 LAB — RENAL FUNCTION PANEL
Albumin: 2.4 g/dL — ABNORMAL LOW (ref 3.5–5.2)
Anion gap: 13 (ref 5–15)
BUN: 32 mg/dL — AB (ref 6–23)
CALCIUM: 9.1 mg/dL (ref 8.4–10.5)
CO2: 28 mEq/L (ref 19–32)
CREATININE: 1.28 mg/dL — AB (ref 0.50–1.10)
Chloride: 100 mEq/L (ref 96–112)
GFR calc Af Amer: 45 mL/min — ABNORMAL LOW (ref >90)
GFR calc non Af Amer: 39 mL/min — ABNORMAL LOW (ref >90)
GLUCOSE: 91 mg/dL (ref 70–99)
POTASSIUM: 4.5 meq/L (ref 3.7–5.3)
Phosphorus: 2.1 mg/dL — ABNORMAL LOW (ref 2.3–4.6)
Sodium: 141 mEq/L (ref 137–147)

## 2014-04-23 LAB — UIFE/LIGHT CHAINS/TP QN, 24-HR UR
ALBUMIN, U: DETECTED
Alpha 1, Urine: DETECTED — AB
Alpha 2, Urine: DETECTED — AB
Beta, Urine: DETECTED — AB
GAMMA UR: DETECTED — AB
TIME-UPE24: 24 h
TOTAL PROTEIN, URINE-UPE24: 15 mg/dL (ref 5–24)
Total Protein, Urine-Ur/day: 375 mg/d — ABNORMAL HIGH (ref <150)
VOLUME, URINE-UPE24: 2500 mL

## 2014-04-23 LAB — CBC WITH DIFFERENTIAL/PLATELET
BASOS ABS: 0.1 10*3/uL (ref 0.0–0.1)
BASOS PCT: 3 % — AB (ref 0–1)
EOS ABS: 0.2 10*3/uL (ref 0.0–0.7)
Eosinophils Relative: 7 % — ABNORMAL HIGH (ref 0–5)
HCT: 25.6 % — ABNORMAL LOW (ref 36.0–46.0)
HEMOGLOBIN: 8.5 g/dL — AB (ref 12.0–15.0)
LYMPHS ABS: 1.1 10*3/uL (ref 0.7–4.0)
LYMPHS PCT: 34 % (ref 12–46)
MCH: 30.5 pg (ref 26.0–34.0)
MCHC: 33.2 g/dL (ref 30.0–36.0)
MCV: 91.8 fL (ref 78.0–100.0)
MONO ABS: 0.6 10*3/uL (ref 0.1–1.0)
Monocytes Relative: 18 % — ABNORMAL HIGH (ref 3–12)
NEUTROS ABS: 1.2 10*3/uL — AB (ref 1.7–7.7)
Neutrophils Relative %: 38 % — ABNORMAL LOW (ref 43–77)
Platelets: 460 10*3/uL — ABNORMAL HIGH (ref 150–400)
RBC: 2.79 MIL/uL — ABNORMAL LOW (ref 3.87–5.11)
RDW: 17 % — AB (ref 11.5–15.5)
WBC: 3.2 10*3/uL — ABNORMAL LOW (ref 4.0–10.5)

## 2014-04-23 LAB — IMMUNOFIXATION ELECTROPHORESIS
IGG (IMMUNOGLOBIN G), SERUM: 829 mg/dL (ref 690–1700)
IgA: 246 mg/dL (ref 69–380)
IgM, Serum: 50 mg/dL — ABNORMAL LOW (ref 52–322)
Total Protein ELP: 5.9 g/dL — ABNORMAL LOW (ref 6.0–8.3)

## 2014-04-23 LAB — PROTEIN ELECTROPHORESIS, SERUM
ALPHA-1-GLOBULIN: 8.4 % — AB (ref 2.9–4.9)
ALPHA-2-GLOBULIN: 18.2 % — AB (ref 7.1–11.8)
Albumin ELP: 47.6 % — ABNORMAL LOW (ref 55.8–66.1)
BETA GLOBULIN: 7.3 % — AB (ref 4.7–7.2)
Beta 2: 5.8 % (ref 3.2–6.5)
Gamma Globulin: 12.7 % (ref 11.1–18.8)
M-Spike, %: NOT DETECTED g/dL
Total Protein ELP: 6 g/dL (ref 6.0–8.3)

## 2014-04-23 LAB — APTT: aPTT: 28 seconds (ref 24–37)

## 2014-04-23 LAB — PROTIME-INR
INR: 1.07 (ref 0.00–1.49)
Prothrombin Time: 13.9 seconds (ref 11.6–15.2)

## 2014-04-23 LAB — BONE MARROW EXAM

## 2014-04-23 MED ORDER — SODIUM CHLORIDE 0.9 % IV SOLN
INTRAVENOUS | Status: AC | PRN
  Administered 2014-04-23: 10 mL/h via INTRAVENOUS

## 2014-04-23 MED ORDER — MIDAZOLAM HCL 2 MG/2ML IJ SOLN
INTRAMUSCULAR | Status: AC
  Filled 2014-04-23: qty 4

## 2014-04-23 MED ORDER — LIDOCAINE HCL 1 % IJ SOLN
INTRAMUSCULAR | Status: AC
  Filled 2014-04-23: qty 20

## 2014-04-23 MED ORDER — FENTANYL CITRATE 0.05 MG/ML IJ SOLN
INTRAMUSCULAR | Status: AC
Start: 2014-04-23 — End: 2014-04-23
  Filled 2014-04-23: qty 4

## 2014-04-23 MED ORDER — RIVAROXABAN 15 MG PO TABS
15.0000 mg | ORAL_TABLET | Freq: Every day | ORAL | Status: DC
  Administered 2014-04-24 – 2014-04-25 (×2): 15 mg via ORAL
  Filled 2014-04-23 (×3): qty 1

## 2014-04-23 MED ORDER — MIDAZOLAM HCL 2 MG/2ML IJ SOLN
INTRAMUSCULAR | Status: AC | PRN
  Administered 2014-04-23: 0.5 mg via INTRAVENOUS

## 2014-04-23 MED ORDER — HYDROCODONE-ACETAMINOPHEN 5-325 MG PO TABS
1.0000 | ORAL_TABLET | ORAL | Status: DC | PRN
  Administered 2014-04-23 – 2014-04-24 (×2): 2 via ORAL
  Filled 2014-04-23 (×2): qty 2

## 2014-04-23 MED ORDER — ACETAMINOPHEN 325 MG PO TABS
650.0000 mg | ORAL_TABLET | Freq: Three times a day (TID) | ORAL | Status: DC
  Administered 2014-04-24 – 2014-04-27 (×10): 650 mg via ORAL
  Filled 2014-04-23 (×9): qty 2

## 2014-04-23 MED ORDER — FENTANYL CITRATE 0.05 MG/ML IJ SOLN
INTRAMUSCULAR | Status: AC | PRN
  Administered 2014-04-23: 12.5 ug via INTRAVENOUS
  Administered 2014-04-23: 25 ug via INTRAVENOUS

## 2014-04-23 NOTE — Sedation Documentation (Signed)
Waiting for transport

## 2014-04-23 NOTE — Sedation Documentation (Signed)
Patient denies pain and is resting comfortably.  

## 2014-04-23 NOTE — Progress Notes (Addendum)
Patient ID: Zoe Robinson, female   DOB: May 30, 1932, 80 y.o.   MRN: [id number]  [facility] PROGRESS NOTE  Zoe Robinson HWK:[id number] DOB: 1932/01/18 DOA: 04/14/2014 PCP: [name], MD  Brief narrative: 80 y.o. female with hypertension, atrial fibrillation on chronic anticoagulation, rheumatoid arthritis on chronic prednisone, chronic kidney disease stage III, and osteoporosis on calcium and vitamin D, who presented to [facility] ED with with progressive weakness one week in duration, associated with nausea and poor oral intake, generalized headache, several episodes of small amount of nose bleeds which all resolved spontaneously. In ED, pt noted to have UTI, was hypercalcemic > 15, ARF with Cr 1.62. TRH asked to admit  For further evaluation. Denied any chest pain, shortness of breath, abdominal pain, diarrhea, or vision changes.   Assessment and Plan:    Principal Problem:   Generalized weakness Eventual SNF, but still having fevers  Active Problems:   Hypercalcemia CT C/A/P without malignancy. For bone marrow biopsy. SPEP pending. Skeletal survey negative. ACE ok.  Fever: continues despite adequate RX for e coli uti. No longer neutropenic. Will d/c cipro. Blood cultures remain negative. Will get another set. Check doppler legs r/o DVT. TTE r/o vegetation. BMBx pending for flow cytometry, cytogenetics, MDS FISH panel, fungal and AFB smear and cx. Interferon gamma release assay indeterminate. ?malignancy related? RA? May need id consult if fever continues and workup unrevealing.  Discussed with Dr. Lindi Adie    Acute on chronic renal failure Resolved    RA (rheumatoid arthritis) - continue Prednisone. MTX stopped due to neutropenia, anemia    Hypertension Stable    Atrial fibrillation Resume xarelto.    UTI (urinary tract infection), E coli Has had 7 days rocephin/cephalexin. 3 days cipro. Repeat urine culture negative  Neutropenia Resolving. MTX stopped. BMBx  pending    Hypokalemia/Hypomagnesemia  Corrected  Anemia: panel consistent with chronic disease    Severe protein calorie malnutrition Add ensure. Dietitian consult  Constipation: continue current regimen  Code Status: Full Family Communication: multiple at bedside Disposition Plan: SNF when stable  Medical Consultants:   Nephrology  oncology Anti-Infectives:   Cephalexin    LOS: 9 days   HPI/Subjective: No cough, dysuria, rash, n/v/d  Objective: Filed Vitals:   04/23/14 1126 04/23/14 1130 04/23/14 1144 04/23/14 1200  BP: _0 90/40  Pulse: 72 71 73 72  Temp:      TempSrc:      Resp: _1 Height:      Weight:      SpO2: 97% 97% 93% 92%    Intake/Output Summary (Last 24 hours) at 04/23/14 1457 Last data filed at 04/23/14 0500  Gross per 24 hour  Intake    240 ml  Output   2100 ml  Net  -1860 ml    Exam:   General:  Sedated after procedure  Cardiovascular: Regular rate and rhythm, no rubs, no gallops  Respiratory: Clear to auscultation bilaterally, no wheezing, no crackles, no rhonchi  Abdomen: Soft, non tender, non distended, bowel sounds present, no guarding  Ext no CCE  Data Reviewed: Basic Metabolic Panel:  Recent Labs Lab 04/17/14 0750  04/18/14 0750 04/19/14 0530 04/19/14 0538 04/20/14 0500 04/20/14 0550 04/21/14 0622 04/22/14 0510 04/23/14 0634  NA 144  --  139 139  --   --  138 142 137 141  K 3.3*  --  3.6* 3.6*  --   --  4.0 3.8 4.1 4.5  CL 105  --  100  100  --   --  103 108 101 100  CO2 27  --  25 24  --   --  _0 GLUCOSE 119*  --  97 96  --   --  89 77 92 91  BUN 42*  --  44* 51*  --   --  44* 38* 29* 32*  CREATININE 2.03*  --  2.08* 2.01*  --  2.01* 1.69* 1.33* 1.35* 1.28*  CALCIUM 10.4  --  11.2* 10.6*  --   --  9.1 8.7 8.6 9.1  MG  --   --  1.4*  --  1.5  --  1.7  --   --   --   PHOS  --   < > 2.6 2.6  --   --  2.0* 2.1* 1.7* 2.1*  < > = values in this interval not displayed. Liver Function  Tests:  Recent Labs Lab 04/17/14 0750 04/18/14 0750 04/19/14 0530 04/20/14 0550 04/21/14 0622 04/22/14 0510 04/23/14 0634  AST 23 32  --   --   --   --   --   ALT 14 21  --   --   --   --   --   ALKPHOS 44 51  --   --   --   --   --   BILITOT 0.2* 0.2*  --   --   --   --   --   PROT 6.0 7.0  --   --   --   --   --   ALBUMIN 2.6* 3.0* 2.8* 2.3* 2.1* 2.1* 2.4*   CBC:  Recent Labs Lab 04/18/14 0750 04/20/14 0550 04/21/14 0622 04/22/14 0510 04/23/14 0634  WBC 2.0* 1.6* 1.7* 2.2* 3.2*  NEUTROABS  --  0.7* 0.9* 1.1* 1.2*  HGB 8.8* 7.6* 7.7* 7.5* 8.5*  HCT 26.7* 23.2* 23.0* 22.5* 25.6*  MCV 91.4 92.1 89.5 91.8 91.8  PLT 231 195 242 326 460*   Cardiac Enzymes:  Recent Labs Lab 04/19/14 0538  CKTOTAL 32   Recent Results (from the past 240 hour(s))  URINE CULTURE     Status: None   Collection Time    04/14/14 11:09 PM      Result Value Ref Range Status   Specimen Description URINE, CATHETERIZED   Final   Special Requests CX ADDED AT 0037 ON [id number]   Final   Culture  Setup Time     Final   Value: 04/15/2014 01:09     Performed at [facility]     Final   Value: >=100,000 COLONIES/ML     Performed at [facility]   Culture     Final   Value: ESCHERICHIA COLI     Performed at [facility]   Report Status 04/17/2014 FINAL   Final   Organism ID, Bacteria ESCHERICHIA COLI   Final  CULTURE, BLOOD (ROUTINE X 2)     Status: None   Collection Time    04/20/14  5:50 AM      Result Value Ref Range Status   Specimen Description BLOOD RIGHT ARM   Final   Special Requests BOTTLES DRAWN AEROBIC ONLY 5CC   Final   Culture  Setup Time     Final   Value: 04/20/2014 14:51     Performed at [facility]   Culture     Final   Value:  BLOOD CULTURE RECEIVED NO GROWTH TO DATE CULTURE WILL BE HELD FOR 5 DAYS BEFORE ISSUING A FINAL NEGATIVE REPORT     Performed at [facility]   Report Status PENDING   Incomplete   CULTURE, BLOOD (ROUTINE X 2)     Status: None   Collection Time    04/20/14  5:55 AM      Result Value Ref Range Status   Specimen Description BLOOD RIGHT ARM   Final   Special Requests BOTTLES DRAWN AEROBIC ONLY 10CC   Final   Culture  Setup Time     Final   Value: 04/20/2014 14:51     Performed at [facility]   Culture     Final   Value:        BLOOD CULTURE RECEIVED NO GROWTH TO DATE CULTURE WILL BE HELD FOR 5 DAYS BEFORE ISSUING A FINAL NEGATIVE REPORT     Performed at [facility]   Report Status PENDING   Incomplete  URINE CULTURE     Status: None   Collection Time    04/20/14  3:00 PM      Result Value Ref Range Status   Specimen Description URINE, CATHETERIZED   Final   Special Requests NONE   Final   Culture  Setup Time     Final   Value: 04/19/2014 16:27     Performed at [facility]     Final   Value: NO GROWTH     Performed at [facility]   Culture     Final   Value: NO GROWTH     Performed at [facility]   Report Status 04/22/2014 FINAL   Final     Scheduled Meds: . cephALEXin  500 mg Oral Q12H  . docusate sodium  100 mg Oral BID  . enoxaparin injection  40 mg Subcutaneous Q24H  . folic acid  1 mg Oral Daily  . oxybutynin  10 mg Oral Daily  . pamidronate  30 mg Intravenous Once  . predniSONE  10 mg Oral QODAY  . predniSONE  5 mg Oral QODAY  . simvastatin  20 mg Oral QPM   Continuous Infusions:   Doree Barthel, MD [facility] [phone]

## 2014-04-23 NOTE — Procedures (Signed)
CT-guided  R iliac bone marrow aspiration and core biopsy No complication No blood loss. See complete dictation in [facility] PACS  

## 2014-04-24 DIAGNOSIS — R5081 Fever presenting with conditions classified elsewhere: Secondary | ICD-10-CM

## 2014-04-24 DIAGNOSIS — I369 Nonrheumatic tricuspid valve disorder, unspecified: Secondary | ICD-10-CM

## 2014-04-24 DIAGNOSIS — M79609 Pain in unspecified limb: Secondary | ICD-10-CM

## 2014-04-24 LAB — CBC WITH DIFFERENTIAL/PLATELET
BASOS PCT: 2 % — AB (ref 0–1)
Band Neutrophils: 0 % (ref 0–10)
Basophils Absolute: 0.1 10*3/uL (ref 0.0–0.1)
Blasts: 0 %
EOS ABS: 0.1 10*3/uL (ref 0.0–0.7)
EOS PCT: 4 % (ref 0–5)
HCT: 24.1 % — ABNORMAL LOW (ref 36.0–46.0)
HEMOGLOBIN: 7.7 g/dL — AB (ref 12.0–15.0)
Lymphocytes Relative: 44 % (ref 12–46)
Lymphs Abs: 1.4 10*3/uL (ref 0.7–4.0)
MCH: 29.4 pg (ref 26.0–34.0)
MCHC: 32 g/dL (ref 30.0–36.0)
MCV: 92 fL (ref 78.0–100.0)
METAMYELOCYTES PCT: 0 %
MONO ABS: 0.5 10*3/uL (ref 0.1–1.0)
MYELOCYTES: 0 %
Monocytes Relative: 14 % — ABNORMAL HIGH (ref 3–12)
NEUTROS ABS: 1.2 10*3/uL — AB (ref 1.7–7.7)
NEUTROS PCT: 36 % — AB (ref 43–77)
NRBC: 0 /100{WBCs}
Platelets: 533 10*3/uL — ABNORMAL HIGH (ref 150–400)
Promyelocytes Absolute: 0 %
RBC: 2.62 MIL/uL — AB (ref 3.87–5.11)
RDW: 17.5 % — ABNORMAL HIGH (ref 11.5–15.5)
WBC: 3.3 10*3/uL — AB (ref 4.0–10.5)

## 2014-04-24 LAB — URINALYSIS, ROUTINE W REFLEX MICROSCOPIC
Bilirubin Urine: NEGATIVE
GLUCOSE, UA: 250 mg/dL — AB
HGB URINE DIPSTICK: NEGATIVE
KETONES UR: NEGATIVE mg/dL
LEUKOCYTES UA: NEGATIVE
Nitrite: NEGATIVE
PH: 7.5 (ref 5.0–8.0)
PROTEIN: NEGATIVE mg/dL
Specific Gravity, Urine: 1.009 (ref 1.005–1.030)
Urobilinogen, UA: 0.2 mg/dL (ref 0.0–1.0)

## 2014-04-24 LAB — PATHOLOGIST SMEAR REVIEW

## 2014-04-24 NOTE — Progress Notes (Signed)
Patient ID: Zoe Robinson, female   DOB: 1931-09-07, 80 y.o.   MRN: [id number]  [facility] PROGRESS NOTE  Zoe Robinson BOF:[id number] DOB: [birth year] DOA: 04/14/2014 PCP: Lindwood Qua, MD  Brief narrative: 80 y.o. female with hypertension, atrial fibrillation on chronic anticoagulation, rheumatoid arthritis on chronic prednisone, chronic kidney disease stage III, and osteoporosis on calcium and vitamin D, who presented to [facility] ED with with progressive weakness one week in duration, associated with nausea and poor oral intake, generalized headache, several episodes of small amount of nose bleeds which all resolved spontaneously. In ED, pt noted to have UTI, was hypercalcemic > 15, ARF with Cr 1.62. TRH asked to admit  For further evaluation. Denied any chest pain, shortness of breath, abdominal pain, diarrhea, or vision changes. Nephrology consulted and signed off. Developed neutopenia, possibly related to MTX which has been stopped. Developed persistent fevers despite adequate RX of E coli and negative BC, repeat urine cx, CXR, CT C/A/P   Assessment and Plan:    Principal Problem:   Generalized weakness Eventual SNF, but still having fevers  Active Problems:   Hypercalcemia Resolved after IVF, miacalcin, pamidronate, discontinuation of vit D and calcium.  CT C/A/P without malignancy. BMBx 9/29, results pending. SPEP pending. Skeletal survey negative. ACE ok.  Fever: continues despite adequate RX for e coli uti. No longer neutropenic. Blood cultures remain negative. another set drawn this am.  doppler legs r/o DVT pending. TTE r/o vegetation pending. BMBx pending for flow cytometry, cytogenetics, MDS FISH panel, fungal and AFB smear and cx. Interferon gamma release assay indeterminate. ?malignancy related? RA? May need id consult if fever continues and workup unrevealing.  Getting scheduled tylenol. D/c foley    Acute on chronic renal failure Resolved    RA (rheumatoid  arthritis) - continue Prednisone. MTX stopped due to neutropenia, anemia. No change in joint aches or exam to suggest flair, other than fevers    Hypertension Stable    Atrial fibrillation Resume xarelto.    UTI (urinary tract infection), E coli Has had 7 days rocephin/cephalexin. 3 days cipro. Repeat urine culture negative  Neutropenia Resolving. MTX stopped. BMBx pending    Hypokalemia/Hypomagnesemia  Corrected  Anemia: panel consistent with chronic disease    Severe protein calorie malnutrition Add ensure. Dietitian consult  Constipation: continue current regimen  Code Status: Full Family Communication: daughter Disposition Plan: SNF when stable  Medical Consultants:   Nephrology  Oncology IR Anti-Infectives:   Cephalexin 7 days completed cipro 3 days completed  Procedures BMBx 9/29 by IR   LOS: 10 days   HPI/Subjective: Slept well small amount of drainage from bx site. No rash, cough, joint pains, n/v/d Objective: Filed Vitals:   04/23/14 1650 04/23/14 2100 04/24/14 0049 04/24/14 0445  BP: 84/46 107/49  121/52  Pulse: 66 74  70  Temp: 97.5 F (36.4 C) 101.4 F (38.6 C) 98.6 F (37 C) 99 F (37.2 C)  TempSrc: Oral Oral Oral Oral  Resp: 18 17  18   Height:      Weight:      SpO2: 92% 96%  92%    Intake/Output Summary (Last 24 hours) at 04/24/14 0817 Last data filed at 04/24/14 0446  Gross per 24 hour  Intake      0 ml  Output   1125 ml  Net  -1125 ml    Exam:   General:  Asleep. Arousable, frail  Cardiovascular: Regular rate and rhythm, no rubs, no gallops  Respiratory: Clear to auscultation bilaterally, no  wheezing, no crackles, no rhonchi  Abdomen: Soft, non tender, non distended, bowel sounds present, no guarding  Ext no CCE  Data Reviewed: Basic Metabolic Panel:  Recent Labs Lab 04/18/14 0750 04/19/14 0530 04/19/14 0538 04/20/14 0500 04/20/14 0550 04/21/14 0622 04/22/14 0510 04/23/14 0634  NA 139 139  --   --  138 142  137 141  K 3.6* 3.6*  --   --  4.0 3.8 4.1 4.5  CL 100 100  --   --  103 108 101 100  CO2 25 24  --   --  19 21 25 28   GLUCOSE 97 96  --   --  89 77 92 91  BUN 44* 51*  --   --  44* 38* 29* 32*  CREATININE 2.08* 2.01*  --  2.01* 1.69* 1.33* 1.35* 1.28*  CALCIUM 11.2* 10.6*  --   --  9.1 8.7 8.6 9.1  MG 1.4*  --  1.5  --  1.7  --   --   --   PHOS 2.6 2.6  --   --  2.0* 2.1* 1.7* 2.1*   Liver Function Tests:  Recent Labs Lab 04/18/14 0750 04/19/14 0530 04/20/14 0550 04/21/14 0622 04/22/14 0510 04/23/14 0634  AST 32  --   --   --   --   --   ALT 21  --   --   --   --   --   ALKPHOS 51  --   --   --   --   --   BILITOT 0.2*  --   --   --   --   --   PROT 7.0  --   --   --   --   --   ALBUMIN 3.0* 2.8* 2.3* 2.1* 2.1* 2.4*   CBC:  Recent Labs Lab 04/20/14 0550 04/21/14 0622 04/22/14 0510 04/23/14 0634 04/24/14 0629  WBC 1.6* 1.7* 2.2* 3.2* 3.3*  NEUTROABS 0.7* 0.9* 1.1* 1.2* PENDING  HGB 7.6* 7.7* 7.5* 8.5* 7.7*  HCT 23.2* 23.0* 22.5* 25.6* 24.1*  MCV 92.1 89.5 91.8 91.8 92.0  PLT 195 242 326 460* 533*   Cardiac Enzymes:  Recent Labs Lab 04/19/14 0538  CKTOTAL 32   Recent Results (from the past 240 hour(s))  URINE CULTURE     Status: None   Collection Time    04/14/14 11:09 PM      Result Value Ref Range Status   Specimen Description URINE, CATHETERIZED   Final   Special Requests CX ADDED AT 0037 ON 04/16/14   Final   Culture  Setup Time     Final   Value: 04/15/2014 01:09     Performed at 04/17/2014   [facility]     Final   Value: >=100,000 COLONIES/ML     Performed at [facility]   Culture     Final   Value: ESCHERICHIA COLI     Performed at [facility]   Report Status 04/17/2014 FINAL   Final   Organism ID, Bacteria ESCHERICHIA COLI   Final  CULTURE, BLOOD (ROUTINE X 2)     Status: None   Collection Time    04/20/14  5:50 AM      Result Value Ref Range Status   Specimen Description BLOOD RIGHT ARM   Final   Special  Requests BOTTLES DRAWN AEROBIC ONLY 5CC   Final   Culture  Setup Time     Final  Value: 04/20/2014 14:51     Performed at [facility]   Culture     Final   Value:        BLOOD CULTURE RECEIVED NO GROWTH TO DATE CULTURE WILL BE HELD FOR 5 DAYS BEFORE ISSUING A FINAL NEGATIVE REPORT     Performed at [facility]   Report Status PENDING   Incomplete  CULTURE, BLOOD (ROUTINE X 2)     Status: None   Collection Time    04/20/14  5:55 AM      Result Value Ref Range Status   Specimen Description BLOOD RIGHT ARM   Final   Special Requests BOTTLES DRAWN AEROBIC ONLY 10CC   Final   Culture  Setup Time     Final   Value: 04/20/2014 14:51     Performed at [facility]   Culture     Final   Value:        BLOOD CULTURE RECEIVED NO GROWTH TO DATE CULTURE WILL BE HELD FOR 5 DAYS BEFORE ISSUING A FINAL NEGATIVE REPORT     Performed at [facility]   Report Status PENDING   Incomplete  URINE CULTURE     Status: None   Collection Time    04/20/14  3:00 PM      Result Value Ref Range Status   Specimen Description URINE, CATHETERIZED   Final   Special Requests NONE   Final   Culture  Setup Time     Final   Value: 04/19/2014 16:27     Performed at [facility] Count     Final   Value: NO GROWTH     Performed at [facility]   Culture     Final   Value: NO GROWTH     Performed at [facility]   Report Status 04/22/2014 FINAL   Final     Scheduled Meds: . cephALEXin  500 mg Oral Q12H  . docusate sodium  100 mg Oral BID  . enoxaparin injection  40 mg Subcutaneous Q24H  . folic acid  1 mg Oral Daily  . oxybutynin  10 mg Oral Daily  . pamidronate  30 mg Intravenous Once  . predniSONE  10 mg Oral QODAY  . predniSONE  5 mg Oral QODAY  . simvastatin  20 mg Oral QPM   Continuous Infusions:   Crista Curb, MD [facility] [phone]

## 2014-04-24 NOTE — Progress Notes (Signed)
PT Cancellation Note  Patient Details Name: Zoe Robinson MRN: [id number] DOB: 1932-03-25   Cancelled Treatment:    Reason Eval/Treat Not Completed: Other (comment) Sleeping soundly after a lot of activity today;  Opted not to wake pt;   Continue to agree with SNF for postacute rehab to maximize independence and safety with mobility prior to dc home.  Van Clines, [place]  Acute Rehabilitation Services Pager [phone] Office [phone]   Van Clines [facility] 04/24/2014, 4:44 PM

## 2014-04-24 NOTE — Progress Notes (Signed)
Echocardiogram 2D Echocardiogram has been performed.  04/24/2014 11:53 AM Gertie Fey, RVT, RDCS, RDMS

## 2014-04-24 NOTE — Progress Notes (Signed)
SUBJECTIVE: Fevers daily; Bone marrow biopsy done yday and she tolerated it well  OBJECTIVE PHYSICAL EXAMINATION: ECOG PERFORMANCE STATUS: 3 - Symptomatic, >50% confined to bed  Filed Vitals:   04/24/14 0445  BP: 121/52  Pulse: 70  Temp: 99 F (37.2 C)  Resp: 18   Filed Weights   04/19/14 2033 04/20/14 2100 04/21/14 2006  Weight: 96 lb (43.545 kg) 100 lb 1.6 oz (45.405 kg) 99 lb 12.8 oz (45.269 kg)    GENERAL:alert, no distress and comfortable SKIN: occasional bruising EYES: normal, Conjunctiva are pink and non-injected, sclera clear OROPHARYNX:no exudate, no erythema and lips, buccal mucosa, and tongue normal  LUNGS: clear to auscultation and percussion with normal breathing effort HEART: regular rate & rhythm and no murmurs and no lower extremity edema ABDOMEN:abdomen soft, non-tender and normal bowel sounds Musculoskeletal:frail lady  NEURO: alert & oriented x 3 Moves all extremities  LABORATORY DATA:  I have reviewed the data as listed @LASTCHEMISTRY @  Lab Results  Component Value Date   WBC 3.2* 04/23/2014   HGB 8.5* 04/23/2014   HCT 25.6* 04/23/2014   MCV 91.8 04/23/2014   PLT 460* 04/23/2014   NEUTROABS 1.2* 04/23/2014    ASSESSMENT AND PLAN:  1. Leucopenia: ANC is slowly improving spontaneously. Bone marrow biopsy result pending. Awaiting initial flow cytometry result hopefully today Could be related to Infection vs Methotrexate treatment  2. Persistant fevers: Cultures were all negative. Antibiotics have been stopped. ID consult pending  Please call with questions. I will see her if I have more results from the bone marrow biopsy

## 2014-04-24 NOTE — Progress Notes (Signed)
Occupational Therapy Treatment Patient Details Name: Zoe Robinson MRN: [id number] DOB: [birth year] Today's Date: 04/24/2014    History of present illness Zoe Robinson is a 80 y.o. Caucasian female with history of hypertension, atrial fibrillation on chronic anticoagulation, rheumatoid arthritis, chronic kidney disease stage III, and osteoporosis on calcium and vitamin D who presents with generalized weakness; She was found to have a urinary tract infection, hypercalcemic, and acute renal failure   OT comments  Pt seen today to increase activity tolerance and strength to promote independence with ADLs. Pt will continue to benefit from acute OT to increase independence. Feel that pt will now benefit from SNF to increase safety at d/c.   Follow Up Recommendations  SNF;Supervision/Assistance - 24 hour    Equipment Recommendations  None recommended by OT    Recommendations for Other Services      Precautions / Restrictions Precautions Precautions: Fall Restrictions Weight Bearing Restrictions: No       Mobility Bed Mobility Overal bed mobility: Needs Assistance Bed Mobility: Sit to Supine;Supine to Sit     Supine to sit: Min guard Sit to supine: Min guard   General bed mobility comments: Pt able to get to EOB with use of bed rails and HOB elevated. No physical assist needed.    Transfers Overall transfer level: Needs assistance Equipment used: Rolling walker (2 wheeled) Transfers: Sit to/from Stand Sit to Stand: Min guard         General transfer comment: Pt with good technique. No physical assist or VC's required.     Balance Overall balance assessment: Needs assistance Sitting-balance support: No upper extremity supported;Feet supported Sitting balance-Leahy Scale: Fair     Standing balance support: Bilateral upper extremity supported;During functional activity Standing balance-Leahy Scale: Fair Standing balance comment: Pt able to stand at sink with no UE  support for grooming. Requires support for dynamic standing.                    ADL Overall ADL's : Needs assistance/impaired     Grooming: Wash/dry hands;Brushing hair;Min guard;Standing           Upper Body Dressing : Set up;Sitting       Toilet Transfer: Min guard;Ambulation;RW;Regular Social worker and Hygiene: Min guard;Sit to/from stand       Functional mobility during ADLs: Min guard;Rolling walker                  Cognition  Arousal/Alertness: Awake/Alert Behavior During Therapy: WFL for tasks assessed/performed Overall Cognitive Status: Impaired/Different from baseline Area of Impairment: Memory;Safety/judgement     Memory: Decreased short-term memory    Safety/Judgement: Decreased awareness of safety;Decreased awareness of deficits            Exercises General Exercises - Upper Extremity Shoulder Flexion: AROM;Both;10 reps;Seated Shoulder ABduction: AROM;Both;10 reps;Seated Elbow Flexion: AROM;Both;10 reps;Seated Elbow Extension: AROM;Both;10 reps;Seated           Pertinent Vitals/ Pain       Pain Assessment: No/denies pain         Frequency Min 2X/week     Progress Toward Goals  OT Goals(current goals can now be found in the care plan section)  Progress towards OT goals: Progressing toward goals  Acute Rehab OT Goals Patient Stated Goal: To get warm OT Goal Formulation: With patient Time For Goal Achievement: 04/29/14 Potential to Achieve Goals: Good ADL Goals Pt Will Perform Grooming: with supervision;standing Pt Will Perform Upper Body Bathing: with supervision;sitting  Pt Will Perform Lower Body Bathing: with supervision;sit to/from stand Pt Will Perform Upper Body Dressing: with supervision;sitting Pt Will Perform Lower Body Dressing: with supervision;sit to/from stand Pt Will Transfer to Toilet: with supervision;ambulating;regular height toilet Pt Will Perform Toileting - Clothing  Manipulation and hygiene: with supervision;sit to/from stand  Plan Discharge plan needs to be updated       End of Session Equipment Utilized During Treatment: Gait belt;Rolling walker   Activity Tolerance Patient tolerated treatment well   Patient Left in chair;with call bell/phone within reach;with chair alarm set;with family/visitor present   Nurse Communication Other (comment) (pt sitting up in recliner)        Time: 1415-1440 OT Time Calculation (min): 25 min  Charges: OT General Charges $OT Visit: 1 Procedure OT Treatments $Self Care/Home Management : 8-22 mins $Therapeutic Activity: 8-22 mins  Nena Jordan M 04/24/2014, 4:32 PM  Carney Living, OTR/L Occupational Therapist [phone] (pager)

## 2014-04-24 NOTE — Progress Notes (Signed)
*  Preliminary Results* Bilateral lower extremity venous duplex completed. Bilateral lower extremities are negative for deep vein thrombosis. There is no evidence of Baker's cyst bilaterally.  04/24/2014  Mohogany Toppins, RVT, RDCS, RDMS  

## 2014-04-25 DIAGNOSIS — I10 Essential (primary) hypertension: Secondary | ICD-10-CM

## 2014-04-25 DIAGNOSIS — N179 Acute kidney failure, unspecified: Secondary | ICD-10-CM

## 2014-04-25 DIAGNOSIS — N390 Urinary tract infection, site not specified: Secondary | ICD-10-CM

## 2014-04-25 LAB — URINE CULTURE
CULTURE: NO GROWTH
Colony Count: NO GROWTH

## 2014-04-25 NOTE — Progress Notes (Signed)
Patient ID: Zoe Robinson, female   DOB: [birth year], 80 y.o.   MRN: [id number]  [facility] PROGRESS NOTE  Zoe Robinson TDS:[id number] DOB: 1932/05/10 DOA: 04/14/2014 PCP: [name], MD  Brief narrative: 80 y.o. female with hypertension, atrial fibrillation on chronic anticoagulation, rheumatoid arthritis on chronic prednisone, chronic kidney disease stage III, and osteoporosis on calcium and vitamin D, who presented to [facility] ED with with progressive weakness one week in duration, associated with nausea and poor oral intake, generalized headache, several episodes of small amount of nose bleeds which all resolved spontaneously. In ED, pt noted to have UTI, was hypercalcemic > 15, ARF with Cr 1.62. TRH asked to admit  For further evaluation. Denied any chest pain, shortness of breath, abdominal pain, diarrhea, or vision changes. Nephrology consulted and signed off. Developed neutopenia, possibly related to MTX which has been stopped. Developed persistent fevers despite adequate RX of E coli and negative BC, repeat urine cx, CXR, CT C/A/P   Assessment and Plan:       Generalized weakness Eventual SNF     Hypercalcemia Resolved after IVF, miacalcin, pamidronate, discontinuation of vit D and calcium.  CT C/A/P without malignancy. BMBx 9/29, results pending. SPEP pending. Skeletal survey negative. ACE ok.  Fever: continues despite adequate RX for e coli uti. No longer neutropenic. Blood cultures remain negative. another set drawn this am.  doppler legs r/o DVT negative. TTE r/o vegetation negative. BMBx pending for flow cytometry, cytogenetics, MDS FISH panel, fungal and AFB smear and cx. Interferon gamma release assay indeterminate.  RA?  Getting scheduled tylenol. D/c foley    Acute on chronic renal failure Resolved    RA (rheumatoid arthritis) - continue Prednisone. MTX stopped due to neutropenia, anemia. No change in joint aches or exam to suggest flair, other than fevers    Hypertension Stable    Atrial fibrillation Resume xarelto.    UTI (urinary tract infection), E coli Has had 7 days rocephin/cephalexin. 3 days cipro. Repeat urine culture negative  Neutropenia Resolving. MTX stopped. BMBx pending    Hypokalemia/Hypomagnesemia  Corrected  Anemia: panel consistent with chronic disease    Severe protein calorie malnutrition Add ensure. Dietitian consult  Constipation: continue current regimen  Code Status: Full Family Communication: daughter Disposition Plan: SNF when stable  Medical Consultants:   Nephrology  Oncology IR Anti-Infectives:   Cephalexin 7 days completed cipro 3 days completed  Procedures BMBx 9/29 by IR   LOS: 11 days   HPI/Subjective: no fever this AM Up working with PT  Objective: Filed Vitals:   04/24/14 1622 04/24/14 2037 04/25/14 0447 04/25/14 1000  BP: 95/46 92/51 96/65  128/57  Pulse: 86 70 55 67  Temp: 98.7 F (37.1 C) 98.7 F (37.1 C) 97.9 F (36.6 C) 98.4 F (36.9 C)  TempSrc: Oral Oral Oral Oral  Resp: 16 18 17 18   Height:      Weight:      SpO2: 92% 94% 98% 98%    Intake/Output Summary (Last 24 hours) at 04/25/14 1100 Last data filed at 04/25/14 1024  Gross per 24 hour  Intake    240 ml  Output    350 ml  Net   -110 ml    Exam:   General:  Awake, NAD  Cardiovascular: Regular rate and rhythm, no rubs, no gallops  Respiratory: Clear to auscultation bilaterally, no wheezing, no crackles, no rhonchi  Abdomen: Soft, non tender, non distended, bowel sounds present, no guarding  Ext no CCE  Data Reviewed: Basic  Metabolic Panel:  Recent Labs Lab 04/19/14 0530 04/19/14 0538 04/20/14 0500 04/20/14 0550 04/21/14 0622 04/22/14 0510 04/23/14 0634  NA 139  --   --  138 142 137 141  K 3.6*  --   --  4.0 3.8 4.1 4.5  CL 100  --   --  103 108 101 100  CO2 24  --   --  19 21 25 28   GLUCOSE 96  --   --  89 77 92 91  BUN 51*  --   --  44* 38* 29* 32*  CREATININE 2.01*  --  2.01*  1.69* 1.33* 1.35* 1.28*  CALCIUM 10.6*  --   --  9.1 8.7 8.6 9.1  MG  --  1.5  --  1.7  --   --   --   PHOS 2.6  --   --  2.0* 2.1* 1.7* 2.1*   Liver Function Tests:  Recent Labs Lab 04/19/14 0530 04/20/14 0550 04/21/14 0622 04/22/14 0510 04/23/14 0634  ALBUMIN 2.8* 2.3* 2.1* 2.1* 2.4*   CBC:  Recent Labs Lab 04/20/14 0550 04/21/14 0622 04/22/14 0510 04/23/14 0634 04/24/14 0629  WBC 1.6* 1.7* 2.2* 3.2* 3.3*  NEUTROABS 0.7* 0.9* 1.1* 1.2* 1.2*  HGB 7.6* 7.7* 7.5* 8.5* 7.7*  HCT 23.2* 23.0* 22.5* 25.6* 24.1*  MCV 92.1 89.5 91.8 91.8 92.0  PLT 195 242 326 460* 533*   Cardiac Enzymes:  Recent Labs Lab 04/19/14 0538  CKTOTAL 32   Recent Results (from the past 240 hour(s))  CULTURE, BLOOD (ROUTINE X 2)     Status: None   Collection Time    04/20/14  5:50 AM      Result Value Ref Range Status   Specimen Description BLOOD RIGHT ARM   Final   Special Requests BOTTLES DRAWN AEROBIC ONLY 5CC   Final   Culture  Setup Time     Final   Value: 04/20/2014 14:51     Performed at [facility]   Culture     Final   Value:        BLOOD CULTURE RECEIVED NO GROWTH TO DATE CULTURE WILL BE HELD FOR 5 DAYS BEFORE ISSUING A FINAL NEGATIVE REPORT     Performed at [facility]   Report Status PENDING   Incomplete  CULTURE, BLOOD (ROUTINE X 2)     Status: None   Collection Time    04/20/14  5:55 AM      Result Value Ref Range Status   Specimen Description BLOOD RIGHT ARM   Final   Special Requests BOTTLES DRAWN AEROBIC ONLY 10CC   Final   Culture  Setup Time     Final   Value: 04/20/2014 14:51     Performed at [facility]   Culture     Final   Value:        BLOOD CULTURE RECEIVED NO GROWTH TO DATE CULTURE WILL BE HELD FOR 5 DAYS BEFORE ISSUING A FINAL NEGATIVE REPORT     Performed at [facility]   Report Status PENDING   Incomplete  URINE CULTURE     Status: None   Collection Time    04/20/14  3:00 PM      Result Value Ref Range Status    Specimen Description URINE, CATHETERIZED   Final   Special Requests NONE   Final   Culture  Setup Time     Final   Value: 04/19/2014 16:27  Performed at [place]     Final   Value: NO GROWTH     Performed at [facility]   Culture     Final   Value: NO GROWTH     Performed at [facility]   Report Status 04/22/2014 FINAL   Final  AFB CULTURE WITH SMEAR     Status: None   Collection Time    04/23/14 11:31 AM      Result Value Ref Range Status   Specimen Description LYMPH NODE   Final   Special Requests BONR MARROW BIOPSY   Final   Acid Fast Smear     Final   Value: NO ACID FAST BACILLI SEEN     Performed at [facility]   Culture     Final   Value: CULTURE WILL BE EXAMINED FOR 6 WEEKS BEFORE ISSUING A FINAL REPORT     Performed at [facility]   Report Status PENDING   Incomplete  FUNGUS CULTURE W SMEAR     Status: None   Collection Time    04/23/14 11:31 AM      Result Value Ref Range Status   Specimen Description BONE   Final   Special Requests BONE MARROW BIOPSY   Final   Fungal Smear     Final   Value: NO YEAST OR FUNGAL ELEMENTS SEEN     Performed at [facility]   Culture     Final   Value: CULTURE IN PROGRESS FOR FOUR WEEKS     Performed at [facility]   Report Status PENDING   Incomplete  CULTURE, BLOOD (ROUTINE X 2)     Status: None   Collection Time    04/24/14  6:29 AM      Result Value Ref Range Status   Specimen Description BLOOD LEFT ANTECUBITAL   Final   Special Requests BOTTLES DRAWN AEROBIC AND ANAEROBIC 10CC   Final   Culture  Setup Time     Final   Value: 04/24/2014 14:36     Performed at [facility]   Culture     Final   Value:        BLOOD CULTURE RECEIVED NO GROWTH TO DATE CULTURE WILL BE HELD FOR 5 DAYS BEFORE ISSUING A FINAL NEGATIVE REPORT     Performed at [facility]   Report Status PENDING   Incomplete  CULTURE, BLOOD (ROUTINE X 2)     Status:  None   Collection Time    04/24/14  6:41 AM      Result Value Ref Range Status   Specimen Description BLOOD LEFT HAND   Final   Special Requests BOTTLES DRAWN AEROBIC AND ANAEROBIC 10CC   Final   Culture  Setup Time     Final   Value: 04/24/2014 14:37     Performed at [facility]   Culture     Final   Value:        BLOOD CULTURE RECEIVED NO GROWTH TO DATE CULTURE WILL BE HELD FOR 5 DAYS BEFORE ISSUING A FINAL NEGATIVE REPORT     Performed at [facility]   Report Status PENDING   Incomplete     [facility] [phone]

## 2014-04-25 NOTE — Progress Notes (Signed)
Physical Therapy Treatment Patient Details Name: Zoe Robinson MRN: [id number] DOB: 12-Jul-1932 Today's Date: 04/25/2014    History of Present Illness Zoe Robinson is a 80 y.o. Caucasian female with history of hypertension, atrial fibrillation on chronic anticoagulation, rheumatoid arthritis, chronic kidney disease stage III, and osteoporosis on calcium and vitamin D who presents with generalized weakness; She was found to have a urinary tract infection, hypercalcemic, and acute renal failure    PT Comments    Pt progressing towards physical therapy goals. Noted that d/c plan is for SNF at d/c. Feel that pt could still benefit from post-acute rehab in the SNF setting despite functional gains. Pt states she is still not at her baseline. Would recommend further gait training without an AD - pt able to tolerate ~15 feet with HHA and the rest of gait training with the RW. Will continue to follow and progress as able per POC.   Follow Up Recommendations  SNF;Supervision/Assistance - 24 hour     Equipment Recommendations  Rolling walker with 5" wheels;3in1 (PT)    Recommendations for Other Services       Precautions / Restrictions Precautions Precautions: Fall Precaution Comments: chair alarm Restrictions Weight Bearing Restrictions: No    Mobility  Bed Mobility Overal bed mobility: Modified Independent Bed Mobility: Supine to Sit           General bed mobility comments: No physical assist required. HOB was slightly elevated and pt used bed rails minimally for support.   Transfers Overall transfer level: Needs assistance Equipment used: Rolling walker (2 wheeled) Transfers: Sit to/from Stand Sit to Stand: Supervision         General transfer comment: VC's for hand placement on seated surface for safety. No unsteadiness noted.   Ambulation/Gait Ambulation/Gait assistance: Min guard Ambulation Distance (Feet): 300 Feet Assistive device: Rolling walker (2  wheeled);None Gait Pattern/deviations: Step-through pattern;Decreased stride length;Trunk flexed Gait velocity: Decreased Gait velocity interpretation: Below normal speed for age/gender General Gait Details: Pt ambulated most of the distance with RW with no unsteadiness or LOB noted. Pt was able to ambulate with HHA from doorway of room to recliner chair without any LOB. Pt states she does not feel that she needs the walker but does not want to fall.    Stairs            Wheelchair Mobility    Modified Rankin (Stroke Patients Only)       Balance Overall balance assessment: Needs assistance Sitting-balance support: Feet supported;No upper extremity supported Sitting balance-Leahy Scale: Good     Standing balance support: Single extremity supported;During functional activity Standing balance-Leahy Scale: Fair Standing balance comment: Pt able to maintain static standing without physical assist or UE support.                     Cognition Arousal/Alertness: Awake/alert Behavior During Therapy: WFL for tasks assessed/performed Overall Cognitive Status: Impaired/Different from baseline Area of Impairment: Memory;Safety/judgement                    Exercises General Exercises - Lower Extremity Ankle Circles/Pumps: 20 reps Quad Sets: 15 reps Long Arc Quad: 15 reps Hip ABduction/ADduction: 15 reps Straight Leg Raises: 15 reps    General Comments        Pertinent Vitals/Pain Pain Assessment: No/denies pain    Home Living                      Prior Function  PT Goals (current goals can now be found in the care plan section) Acute Rehab PT Goals Patient Stated Goal: To get warm PT Goal Formulation: With patient Time For Goal Achievement: 04/29/14 Potential to Achieve Goals: Good Progress towards PT goals: Progressing toward goals    Frequency  Min 3X/week    PT Plan Current plan remains appropriate    Co-evaluation              End of Session Equipment Utilized During Treatment: Gait belt Activity Tolerance: Patient tolerated treatment well Patient left: in chair;with call bell/phone within reach;with chair alarm set     Time: 6967-8938 PT Time Calculation (min): 30 min  Charges:  $Gait Training: 8-22 mins $Therapeutic Exercise: 8-22 mins                    G Codes:      Ruthann Cancer 04/30/14, 10:38 AM  Ruthann Cancer, PT, DPT Acute Rehabilitation Services Pager: [phone]

## 2014-04-26 DIAGNOSIS — M069 Rheumatoid arthritis, unspecified: Secondary | ICD-10-CM

## 2014-04-26 DIAGNOSIS — R5081 Fever presenting with conditions classified elsewhere: Secondary | ICD-10-CM

## 2014-04-26 LAB — CULTURE, BLOOD (ROUTINE X 2)
CULTURE: NO GROWTH
Culture: NO GROWTH

## 2014-04-26 LAB — BASIC METABOLIC PANEL
ANION GAP: 11 (ref 5–15)
BUN: 28 mg/dL — AB (ref 6–23)
CALCIUM: 10.3 mg/dL (ref 8.4–10.5)
CO2: 29 mEq/L (ref 19–32)
CREATININE: 1.11 mg/dL — AB (ref 0.50–1.10)
Chloride: 99 mEq/L (ref 96–112)
GFR, EST AFRICAN AMERICAN: 53 mL/min — AB (ref >90)
GFR, EST NON AFRICAN AMERICAN: 46 mL/min — AB (ref >90)
Glucose, Bld: 76 mg/dL (ref 70–99)
Potassium: 4.7 mEq/L (ref 3.7–5.3)
Sodium: 139 mEq/L (ref 137–147)

## 2014-04-26 LAB — CBC
HEMATOCRIT: 24.9 % — AB (ref 36.0–46.0)
Hemoglobin: 8.2 g/dL — ABNORMAL LOW (ref 12.0–15.0)
MCH: 30.3 pg (ref 26.0–34.0)
MCHC: 32.9 g/dL (ref 30.0–36.0)
MCV: 91.9 fL (ref 78.0–100.0)
PLATELETS: 669 10*3/uL — AB (ref 150–400)
RBC: 2.71 MIL/uL — ABNORMAL LOW (ref 3.87–5.11)
RDW: 17.6 % — AB (ref 11.5–15.5)
WBC: 8.3 10*3/uL (ref 4.0–10.5)

## 2014-04-26 MED ORDER — POLYETHYLENE GLYCOL 3350 17 G PO PACK: 17.0000 g | PACK | Freq: Every day | ORAL | Status: AC | PRN

## 2014-04-26 MED ORDER — ENSURE COMPLETE PO LIQD: 237.0000 mL | Freq: Three times a day (TID) | ORAL | Status: AC

## 2014-04-26 MED ORDER — HYDROCODONE-ACETAMINOPHEN 5-325 MG PO TABS: 1.0000 | ORAL_TABLET | ORAL | Status: DC | PRN

## 2014-04-26 MED ORDER — BISACODYL 10 MG RE SUPP: 10.0000 mg | Freq: Every day | RECTAL | Status: DC | PRN

## 2014-04-26 MED ORDER — RIVAROXABAN 15 MG PO TABS
15.0000 mg | ORAL_TABLET | Freq: Every day | ORAL | Status: DC
  Administered 2014-04-26: 15 mg via ORAL
  Filled 2014-04-26 (×2): qty 1

## 2014-04-26 NOTE — Care Management Note (Signed)
CARE MANAGEMENT NOTE 04/26/2014  Patient:  Zoe Robinson, Zoe Robinson   Account Number:  [id number]  Date Initiated:  04/16/2014  Documentation initiated by:  Lizabeth Leyden  Subjective/Objective Assessment:   admitted with hypercalcemia, ARF,UTI     Action/Plan:   progression of care and discharge planning  04/26/2013 Plan to d/c today to SNF, IM given discussed with pt and daughter.   Anticipated DC Date:  04/26/2014   Anticipated DC Plan:  [place]  CM consult      Choice offered to / List presented to:             Status of service:  In process, will continue to follow Medicare Important Message given?  YES (If response is "NO", the following Medicare IM given date fields will be blank) Date Medicare IM given:  04/18/2014 Medicare IM given by:  Kayliah Tindol Date Additional Medicare IM given:  04/22/2014 Additional Medicare IM given by:  [facility]  Discharge Disposition:  [place]  Per UR Regulation:    If discussed at Long Length of Stay Meetings, dates discussed:    Comments:  04/26/2014 Plan for d/c today to SNF, IM given again today. CRoyal RN MPH, case manager, [phone]  04/22/2014 Met with pt and daughter, plan to d/c to SNF for rehab. Jasmine Pang RN MPH, case manager, [phone]

## 2014-04-26 NOTE — Progress Notes (Signed)
Patient ID: Zoe Robinson, female   DOB: 21-Nov-1931, 80 y.o.   MRN: [id number]  [facility] PROGRESS NOTE  Allyse Fregeau BXI:[id number] DOB: 10/31/1931 DOA: 04/14/2014 PCP: [name], MD  Brief narrative: 80 y.o. female with hypertension, atrial fibrillation on chronic anticoagulation, rheumatoid arthritis on chronic prednisone, chronic kidney disease stage III, and osteoporosis on calcium and vitamin D, who presented to [facility] ED with with progressive weakness one week in duration, associated with nausea and poor oral intake, generalized headache, several episodes of small amount of nose bleeds which all resolved spontaneously. In ED, pt noted to have UTI, was hypercalcemic > 15, ARF with Cr 1.62. TRH asked to admit  For further evaluation. Denied any chest pain, shortness of breath, abdominal pain, diarrhea, or vision changes. Nephrology consulted and signed off. Developed neutopenia, possibly related to MTX which has been stopped. Developed persistent fevers despite adequate RX of E coli and negative BC, repeat urine cx, CXR, CT C/A/P   Assessment and Plan:       Generalized weakness Eventual SNF     Hypercalcemia Resolved after IVF, miacalcin, pamidronate, discontinuation of vit D and calcium.  CT C/A/P without malignancy. BMBx 9/29, results pending. SPEP pending. Skeletal survey negative. ACE ok.  Fever: continues despite adequate RX for e coli uti. No longer neutropenic. Blood cultures remain negative. another set drawn this am.  doppler legs r/o DVT negative. TTE r/o vegetation negative. BMBx pending for flow cytometry, cytogenetics, MDS FISH panel, fungal and AFB smear and cx. Interferon gamma release assay indeterminate.  RA?  Getting scheduled tylenol. D/c foley    Acute on chronic renal failure Resolved    RA (rheumatoid arthritis) - continue Prednisone. MTX stopped due to neutropenia, anemia. No change in joint aches or exam to suggest flair, other than  fevers -ATC fevers    Hypertension Stable    Atrial fibrillation Resume xarelto.    UTI (urinary tract infection), E coli Has had 7 days rocephin/cephalexin. 3 days cipro. Repeat urine culture negative  Neutropenia Resolving. MTX stopped. BMBx pending    Hypokalemia/Hypomagnesemia  Corrected  Anemia: panel consistent with chronic disease    Severe protein calorie malnutrition Add ensure. Dietitian consult  Constipation: continue current regimen  Code Status: Full Family Communication: daughter Disposition Plan: SNF tomm  Medical Consultants:   Nephrology  Oncology IR Anti-Infectives:   Cephalexin 7 days completed cipro 3 days completed  Procedures BMBx 9/29 by IR   LOS: 12 days   HPI/Subjective: tired today   Objective: Filed Vitals:   04/25/14 1800 04/25/14 1900 04/26/14 0500 04/26/14 0944  BP: 95/50 101/50 115/48 119/47  Pulse: 68 70 71 81  Temp: 98.2 F (36.8 C) 98.4 F (36.9 C) 99 F (37.2 C) 99.8 F (37.7 C)  TempSrc: Oral Oral Oral Oral  Resp: _0 Height:      Weight:  43.7 kg (96 lb 5.5 oz)    SpO2: 96% 94% 93% 94%    Intake/Output Summary (Last 24 hours) at 04/26/14 1356 Last data filed at 04/26/14 0900  Gross per 24 hour  Intake    600 ml  Output      2 ml  Net    598 ml    Exam:   General:  Awake, NAD  Cardiovascular: Regular rate and rhythm, no rubs, no gallops  Respiratory: Clear to auscultation bilaterally, no wheezing, no crackles, no rhonchi  Abdomen: Soft, non tender, non distended, bowel sounds present, no guarding  Ext no  CCE  Data Reviewed: Basic Metabolic Panel:  Recent Labs Lab 04/20/14 0550 04/21/14 0622 04/22/14 0510 04/23/14 0634 04/26/14 0500  NA 138 142 137 141 139  K 4.0 3.8 4.1 4.5 4.7  CL 103 108 101 100 99  CO2 _0 GLUCOSE 89 77 92 91 76  BUN 44* 38* 29* 32* 28*  CREATININE 1.69* 1.33* 1.35* 1.28* 1.11*  CALCIUM 9.1 8.7 8.6 9.1 10.3  MG 1.7  --   --   --   --    PHOS 2.0* 2.1* 1.7* 2.1*  --    Liver Function Tests:  Recent Labs Lab 04/20/14 0550 04/21/14 0622 04/22/14 0510 04/23/14 0634  ALBUMIN 2.3* 2.1* 2.1* 2.4*   CBC:  Recent Labs Lab 04/20/14 0550 04/21/14 0622 04/22/14 0510 04/23/14 0634 04/24/14 0629 04/26/14 0500  WBC 1.6* 1.7* 2.2* 3.2* 3.3* 8.3  NEUTROABS 0.7* 0.9* 1.1* 1.2* 1.2*  --   HGB 7.6* 7.7* 7.5* 8.5* 7.7* 8.2*  HCT 23.2* 23.0* 22.5* 25.6* 24.1* 24.9*  MCV 92.1 89.5 91.8 91.8 92.0 91.9  PLT 195 242 326 460* 533* 669*   Cardiac Enzymes: No results found for this basename: CKTOTAL, CKMB, CKMBINDEX, TROPONINI,  in the last 168 hours Recent Results (from the past 240 hour(s))  CULTURE, BLOOD (ROUTINE X 2)     Status: None   Collection Time    04/20/14  5:50 AM      Result Value Ref Range Status   Specimen Description BLOOD RIGHT ARM   Final   Special Requests BOTTLES DRAWN AEROBIC ONLY 5CC   Final   Culture  Setup Time     Final   Value: 04/20/2014 14:51     Performed at [facility]   Culture     Final   Value: NO GROWTH 5 DAYS     Performed at [facility]   Report Status 04/26/2014 FINAL   Final  CULTURE, BLOOD (ROUTINE X 2)     Status: None   Collection Time    04/20/14  5:55 AM      Result Value Ref Range Status   Specimen Description BLOOD RIGHT ARM   Final   Special Requests BOTTLES DRAWN AEROBIC ONLY 10CC   Final   Culture  Setup Time     Final   Value: 04/20/2014 14:51     Performed at [facility]   Culture     Final   Value: NO GROWTH 5 DAYS     Note: Culture results may be compromised due to an excessive volume of blood received in culture bottles.     Performed at [facility]   Report Status 04/26/2014 FINAL   Final  URINE CULTURE     Status: None   Collection Time    04/20/14  3:00 PM      Result Value Ref Range Status   Specimen Description URINE, CATHETERIZED   Final   Special Requests NONE   Final   Culture  Setup Time     Final   Value:  04/19/2014 16:27     Performed at [place]     Final   Value: NO GROWTH     Performed at [facility]   Culture     Final   Value: NO GROWTH     Performed at [facility]   Report Status 04/22/2014 FINAL   Final  AFB CULTURE WITH SMEAR  Status: None   Collection Time    04/23/14 11:31 AM      Result Value Ref Range Status   Specimen Description LYMPH NODE   Final   Special Requests BONR MARROW BIOPSY   Final   Acid Fast Smear     Final   Value: NO ACID FAST BACILLI SEEN     Performed at [facility]   Culture     Final   Value: CULTURE WILL BE EXAMINED FOR 6 WEEKS BEFORE ISSUING A FINAL REPORT     Performed at [facility]   Report Status PENDING   Incomplete  FUNGUS CULTURE W SMEAR     Status: None   Collection Time    04/23/14 11:31 AM      Result Value Ref Range Status   Specimen Description BONE   Final   Special Requests BONE MARROW BIOPSY   Final   Fungal Smear     Final   Value: NO YEAST OR FUNGAL ELEMENTS SEEN     Performed at [facility]   Culture     Final   Value: CULTURE IN PROGRESS FOR FOUR WEEKS     Performed at [facility]   Report Status PENDING   Incomplete  CULTURE, BLOOD (ROUTINE X 2)     Status: None   Collection Time    04/24/14  6:29 AM      Result Value Ref Range Status   Specimen Description BLOOD LEFT ANTECUBITAL   Final   Special Requests BOTTLES DRAWN AEROBIC AND ANAEROBIC 10CC   Final   Culture  Setup Time     Final   Value: 04/24/2014 14:36     Performed at [facility]   Culture     Final   Value:        BLOOD CULTURE RECEIVED NO GROWTH TO DATE CULTURE WILL BE HELD FOR 5 DAYS BEFORE ISSUING A FINAL NEGATIVE REPORT     Performed at [facility]   Report Status PENDING   Incomplete  CULTURE, BLOOD (ROUTINE X 2)     Status: None   Collection Time    04/24/14  6:41 AM      Result Value Ref Range Status   Specimen Description BLOOD LEFT HAND    Final   Special Requests BOTTLES DRAWN AEROBIC AND ANAEROBIC 10CC   Final   Culture  Setup Time     Final   Value: 04/24/2014 14:37     Performed at [facility]   Culture     Final   Value:        BLOOD CULTURE RECEIVED NO GROWTH TO DATE CULTURE WILL BE HELD FOR 5 DAYS BEFORE ISSUING A FINAL NEGATIVE REPORT     Performed at [facility]   Report Status PENDING   Incomplete  URINE CULTURE     Status: None   Collection Time    04/24/14  7:39 PM      Result Value Ref Range Status   Specimen Description URINE, RANDOM   Final   Special Requests NONE   Final   Culture  Setup Time     Final   Value: 04/24/2014 22:05     Performed at [place]     Final   Value: NO GROWTH     Performed at [facility]   Culture     Final   Value: NO GROWTH  Performed at [facility]   Report Status 04/25/2014 FINAL   Final     Eulogio Bear DO [facility] [phone]

## 2014-04-26 NOTE — Progress Notes (Signed)
Occupational Therapy Treatment Patient Details Name: Zoe Robinson MRN: [id number] DOB: 08/03/1931 Today's Date: 04/26/2014    History of present illness Zoe Robinson is a 80 y.o. Caucasian female with history of hypertension, atrial fibrillation on chronic anticoagulation, rheumatoid arthritis, chronic kidney disease stage III, and osteoporosis on calcium and vitamin D who presents with generalized weakness; She was found to have a urinary tract infection, hypercalcemic, and acute renal failure   OT comments  Pt progressing toward goals. Continue to recommend SNF for d/c planning.  Follow Up Recommendations  SNF;Supervision/Assistance - 24 hour    Equipment Recommendations  None recommended by OT    Recommendations for Other Services      Precautions / Restrictions Precautions Precautions: Fall Precaution Comments: chair alarm       Mobility Bed Mobility Overal bed mobility: Modified Independent Bed Mobility: Supine to Sit;Sit to Supine     Supine to sit: Min guard Sit to supine: Min guard      Transfers Overall transfer level: Needs assistance Equipment used: Rolling walker (2 wheeled) Transfers: Sit to/from Stand Sit to Stand: Min guard         General transfer comment: VCs for hand placement    Balance                                   ADL Overall ADL's : Needs assistance/impaired     Grooming: Wash/dry hands;Min Production designer, theatre/television/film: Min guard;Ambulation;RW;Regular Social worker and Hygiene: Min guard;Sit to/from stand;Cueing for safety       Functional mobility during ADLs: Min guard;Rolling walker General ADL Comments: Min guard for toileting tasks      Vision                     Perception     Praxis      Cognition   Behavior During Therapy: [facility] for tasks assessed/performed Overall Cognitive Status: Impaired/Different from baseline Area of  Impairment: Memory;Safety/judgement     Memory: Decreased short-term memory    Safety/Judgement: Decreased awareness of deficits          Extremity/Trunk Assessment               Exercises     Shoulder Instructions       General Comments      Pertinent Vitals/ Pain       Pain Assessment: No/denies pain  Home Living                                          Prior Functioning/Environment              Frequency Min 2X/week     Progress Toward Goals  OT Goals(current goals can now be found in the care plan section)  Progress towards OT goals: Progressing toward goals  Acute Rehab OT Goals Patient Stated Goal: To get warm OT Goal Formulation: With patient Time For Goal Achievement: 04/29/14 Potential to Achieve Goals: Good ADL Goals Pt Will Perform Grooming: with supervision;standing Pt Will Perform Upper Body Bathing: with supervision;sitting Pt Will Perform Lower Body Bathing: with supervision;sit to/from stand Pt Will Perform Upper Body Dressing: with supervision;sitting Pt Will Perform Lower  Body Dressing: with supervision;sit to/from stand Pt Will Transfer to Toilet: with supervision;ambulating;regular height toilet Pt Will Perform Toileting - Clothing Manipulation and hygiene: with supervision;sit to/from stand  Plan Discharge plan remains appropriate    Co-evaluation                 End of Session Equipment Utilized During Treatment: Gait belt;Rolling walker   Activity Tolerance Patient tolerated treatment well   Patient Left in bed;with call bell/phone within reach   Nurse Communication          Time: 1400-1411 OT Time Calculation (min): 11 min  Charges: OT General Charges $OT Visit: 1 Procedure OT Treatments $Self Care/Home Management : 8-22 mins  Cipriano Mile 04/26/2014, 4:11 PM  04/26/2014 Cipriano Mile OTR/L Pager [phone] Office [phone]

## 2014-04-26 NOTE — Discharge Summary (Signed)
Physician Discharge Summary  Zoe Robinson KYH:[id number] DOB: September 12, 1931 DOA: 04/14/2014  PCP: [name], MD  Admit date: 04/14/2014 Discharge date: 04/27/2014  Time spent: 35 minutes  Recommendations for Outpatient Follow-up:  1. Outpatient oncology/hematology follow up with  Dr. Lindi Adie  2. PRN renal follow up 3. Monitor for fevers 4. Monitor cbc, bmp 1 week 5. Need follow up with rheumatology 6. Encourage fluid intake 7. DYS 3 diet   Discharge Diagnoses:  Principal Problem:   Generalized weakness Active Problems:   Hypercalcemia   Acute renal failure   CKD (chronic kidney disease)   RA (rheumatoid arthritis)   Hypertension   Atrial fibrillation   Dehydration   UTI (urinary tract infection)   Anemia   Neutropenia   Fever, unspecified   Unspecified constipation   Discharge Condition: stable  Diet recommendation: regular  Filed Weights   04/21/14 2006 04/25/14 1900 04/26/14 2041  Weight: 45.269 kg (99 lb 12.8 oz) 43.7 kg (96 lb 5.5 oz) 43.5 kg (95 lb 14.4 oz)    History of present illness:  Zoe Robinson is a 80 y.o. Caucasian female with history of hypertension, atrial fibrillation on chronic anticoagulation, rheumatoid arthritis, chronic kidney disease stage III, and osteoporosis on calcium and vitamin D who presents with the above complaints. Most of the history was obtained by daughter at bedside. Daughter noted that patient has been feeling gradually weaker over the last week. She has had poor appetite and decreased oral intake at home. About a week ago she had a small nosebleed which resolved spontaneously. Patient also had intermittent episodes of headaches most recently 3 days ago. Today she has been feeling nauseated and had one episode of dry heaving. Due to patient failing to thrive at home, she was brought to the emergency department for further evaluation. She was found to have a urinary tract infection, hypercalcemic, and acute renal failure.  Hospitalist service was asked to admit the patient for further care and management. She has not had any fevers but reports having chills at home. Denies any chest pain, shortness of breath, abdominal pain, diarrhea, or vision changes.   Hospital Course:  Generalized weakness   SNF   Hypercalcemia  Resolved after IVF, miacalcin, pamidronate, discontinuation of vit D and calcium. CT C/A/P without malignancy. BMBx 9/29, results pending. Skeletal survey negative. ACE ok.  -renal follow up as needed  Fever:  -resolved -on ATC tylenol -need rheumatology follow up  Blood cultures remain negative. another set drawn this am. doppler legs r/o DVT negative. TTE r/o vegetation negative.  BMBx cultures negative. Interferon gamma release assay indeterminate.  Acute on chronic renal failure  Resolved   RA (rheumatoid arthritis)  - continue Prednisone. MTX stopped due to neutropenia, anemia. No change in joint aches or exam to suggest flair, other than fevers   Hypertension  Stable   Atrial fibrillation  Resume xarelto.   UTI (urinary tract infection), E coli  Has had 7 days rocephin/cephalexin. 3 days cipro. Repeat urine culture negative   Neutropenia  Resolving. MTX stopped. BMBx pending   Hypokalemia/Hypomagnesemia  Corrected   Anemia: panel consistent with chronic disease   Severe protein calorie malnutrition Add ensure. Dietitian consult   Constipation: continue current regimen      Procedures:  Bone marrow biopsy  Consultations:  Renal  oncology  Discharge Exam: Filed Vitals:   04/27/14 1020  BP: 104/57  Pulse: 81  Temp: 98.4 F (36.9 C)  Resp: 16    General: frail, elderly female Cardiovascular: rrr  Respiratory: clear  Discharge Instructions You were cared for by a hospitalist during your hospital stay. If you have any questions about your discharge medications or the care you received while you were in the hospital after you are discharged, you can  call the unit and asked to speak with the hospitalist on call if the hospitalist that took care of you is not available. Once you are discharged, your primary care physician will handle any further medical issues. Please note that NO REFILLS for any discharge medications will be authorized once you are discharged, as it is imperative that you return to your primary care physician (or establish a relationship with a primary care physician if you do not have one) for your aftercare needs so that they can reassess your need for medications and monitor your lab values.  Discharge Instructions   Discharge instructions    Complete by:  As directed   Monitor for fevers Monitor CBC BMp 1 week Needs close hematology follow up for BMB results     Discharge instructions    Complete by:  As directed   DYS 3 diet- does better with soft and chopped foods     Increase activity slowly    Complete by:  As directed           Current Discharge Medication List    START taking these medications   Details  bisacodyl (DULCOLAX) 10 MG suppository Place 1 suppository (10 mg total) rectally daily as needed for moderate constipation. Qty: 12 suppository, Refills: 0    feeding supplement, ENSURE COMPLETE, (ENSURE COMPLETE) LIQD Take 237 mLs by mouth 3 (three) times daily between meals.    HYDROcodone-acetaminophen (NORCO/VICODIN) 5-325 MG per tablet Take 1-2 tablets by mouth every 4 (four) hours as needed for moderate pain. Qty: 10 tablet, Refills: 0    polyethylene glycol (MIRALAX / GLYCOLAX) packet Take 17 g by mouth daily as needed. Qty: 14 each, Refills: 0      CONTINUE these medications which have NOT CHANGED   Details  acetaminophen (TYLENOL) 500 MG tablet Take 1,000 mg by mouth every 8 (eight) hours.    ferrous sulfate 325 (65 FE) MG tablet Take 325 mg by mouth daily with breakfast.    folic acid (FOLVITE) 1 MG tablet Take 1 mg by mouth daily.    magnesium gluconate (MAGONATE) 500 MG tablet Take  500 mg by mouth daily.    Multiple Vitamins-Minerals (MULTIVITAMIN PO) Take 1 tablet by mouth daily.    oxybutynin (DITROPAN-XL) 10 MG 24 hr tablet Take 10 mg by mouth daily.    predniSONE (DELTASONE) 5 MG tablet Take 5-10 mg by mouth every other day. Take 5 mg by mouth every other day alternating with 10 mg by mouth every other day.    Rivaroxaban (XARELTO) 15 MG TABS tablet Take 15 mg by mouth daily.    simvastatin (ZOCOR) 40 MG tablet Take 20 mg by mouth every evening.      STOP taking these medications     Calcium Carb-Cholecalciferol (CALCIUM 600 + D PO)      methotrexate (RHEUMATREX) 2.5 MG tablet      valsartan (DIOVAN) 320 MG tablet      vitamin E 1000 UNIT capsule        No Known Allergies Follow-up Information   Follow up with [facility], MD In 1 week.   Specialty:  Internal Medicine   Contact information:   [place]  [address] [phone]       Follow up with Rulon Eisenmenger, MD In 2 weeks.   Specialty:  Hematology and Oncology   Contact information:   [place] [id number] [phone]        The results of significant diagnostics from this hospitalization (including imaging, microbiology, ancillary and laboratory) are listed below for reference.    Significant Diagnostic Studies: Ct Abdomen Pelvis Wo Contrast  04/19/2014   CLINICAL DATA:  Hypercalcemia, concern for malignancy.  EXAM: CT CHEST, ABDOMEN AND PELVIS WITHOUT CONTRAST  TECHNIQUE: Multidetector CT imaging of the chest, abdomen and pelvis was performed following the standard protocol without IV contrast.  COMPARISON:  Lumbar spine series 32415 and CT abdomen pelvis 05/17/2013.  FINDINGS: CT CHEST FINDINGS  Mediastinal lymph nodes measure up to 9 mm in the AP window. Hilar regions are difficult to definitively evaluate without IV contrast but appear grossly unremarkable. No axillary adenopathy. Atherosclerotic calcification  of the arterial vasculature, including coronary arteries. Heart is at the upper limits of normal in size to mildly enlarged. Small amount of pericardial fluid may be physiologic. Slight thickening of the distal esophageal wall can be seen with gastroesophageal reflux disease. Retained oral contrast in the esophagus can be seen with dysmotility.  There are patchy areas of ground-glass in the apical and posterior segments of the right upper lobe. Minimal dependent atelectasis in the lower lobes. No pleural fluid. Airway is unremarkable.  CT ABDOMEN AND PELVIS FINDINGS  Hepatobiliary: Liver measures. There may be mild intrahepatic biliary duct dilatation. Common bile duct measures 6 mm. Stone debris layers in the gallbladder.  Spleen: Negative.  Pancreas: Negative.  Adrenals/urinary tract: Adrenal glands and kidneys are unremarkable. Ureters are decompressed. Air and a Foley catheter are seen in a decompressed bladder.  Stomach/Bowel: Stomach and small bowel are unremarkable. Appendix is not readily visualized. A fair amount of stool is seen in the colon. Mesenteries and peritoneum are unremarkable.  Reproductive: Uterus appears absent.  Ovaries are poorly visualized.  Vascular/Lymphatic: Atherosclerotic calcification of the arterial vasculature without abdominal aortic aneurysm. No pathologically enlarged lymph nodes.  Other: No free fluid.  Musculoskeletal: No worrisome lytic or sclerotic lesions. Degenerative changes are seen in the spine. L1 compression fracture and associated retropulsion are unchanged from 10/16/2013.  IMPRESSION: 1. No findings to explain the patient's hypercalcemia. 2. Coronary artery calcification. 3. Patchy areas of ground-glass in the right upper lobe are likely infectious or inflammatory in etiology. 4. Cholelithiasis.   Electronically Signed   By: Lorin Picket M.D.   On: 04/19/2014 16:12   Dg Chest 2 View  04/15/2014   CLINICAL DATA:  Weakness for 1 week, headache, jaundice  EXAM:  CHEST  2 VIEW  COMPARISON:  None.  FINDINGS: Mild cardiac enlargement. Vascular pattern normal. No consolidation or effusion. Severe L1 compression deformity, age uncertain.  IMPRESSION: Mild cardiac enlargement.  L1 compression deformity.   Electronically Signed   By: Skipper Cliche M.D.   On: 04/15/2014 00:19   Ct Chest Wo Contrast  04/19/2014   CLINICAL DATA:  Hypercalcemia, concern for malignancy.  EXAM: CT CHEST, ABDOMEN AND PELVIS WITHOUT CONTRAST  TECHNIQUE: Multidetector CT imaging of the chest, abdomen and pelvis was performed following the standard protocol without IV contrast.  COMPARISON:  Lumbar spine series 32415 and CT abdomen pelvis 05/17/2013.  FINDINGS: CT CHEST FINDINGS  Mediastinal lymph nodes measure up to 9 mm in the AP window. Hilar regions are difficult to definitively evaluate without  IV contrast but appear grossly unremarkable. No axillary adenopathy. Atherosclerotic calcification of the arterial vasculature, including coronary arteries. Heart is at the upper limits of normal in size to mildly enlarged. Small amount of pericardial fluid may be physiologic. Slight thickening of the distal esophageal wall can be seen with gastroesophageal reflux disease. Retained oral contrast in the esophagus can be seen with dysmotility.  There are patchy areas of ground-glass in the apical and posterior segments of the right upper lobe. Minimal dependent atelectasis in the lower lobes. No pleural fluid. Airway is unremarkable.  CT ABDOMEN AND PELVIS FINDINGS  Hepatobiliary: Liver measures. There may be mild intrahepatic biliary duct dilatation. Common bile duct measures 6 mm. Stone debris layers in the gallbladder.  Spleen: Negative.  Pancreas: Negative.  Adrenals/urinary tract: Adrenal glands and kidneys are unremarkable. Ureters are decompressed. Air and a Foley catheter are seen in a decompressed bladder.  Stomach/Bowel: Stomach and small bowel are unremarkable. Appendix is not readily visualized. A  fair amount of stool is seen in the colon. Mesenteries and peritoneum are unremarkable.  Reproductive: Uterus appears absent.  Ovaries are poorly visualized.  Vascular/Lymphatic: Atherosclerotic calcification of the arterial vasculature without abdominal aortic aneurysm. No pathologically enlarged lymph nodes.  Other: No free fluid.  Musculoskeletal: No worrisome lytic or sclerotic lesions. Degenerative changes are seen in the spine. L1 compression fracture and associated retropulsion are unchanged from 10/16/2013.  IMPRESSION: 1. No findings to explain the patient's hypercalcemia. 2. Coronary artery calcification. 3. Patchy areas of ground-glass in the right upper lobe are likely infectious or inflammatory in etiology. 4. Cholelithiasis.   Electronically Signed   By: Lorin Picket M.D.   On: 04/19/2014 16:12   US Renal  04/17/2014   CLINICAL DATA:  Acute kidney injury and renal insufficiency.  EXAM: RENAL/URINARY TRACT ULTRASOUND COMPLETE  COMPARISON:  None.  FINDINGS: Right Kidney:  Length: 9.2 cm. Echogenicity within normal limits. No mass or hydronephrosis visualized.  Left Kidney:  Length: 8.9 cm. Echogenicity within normal limits. No mass or hydronephrosis visualized.  Bladder:  Appears normal for degree of bladder distention.  IMPRESSION: Normal renal ultrasound.   Electronically Signed   By: Aletta Edouard M.D.   On: 04/17/2014 15:38   Ct Biopsy  04/23/2014   CLINICAL DATA:  Pancytopenia  EXAM: CT GUIDED DEEP ILIAC BONE ASPIRATION AND CORE BIOPSY  TECHNIQUE: The procedure, risks (including but not limited to bleeding, infection, organ damage ), benefits, and alternatives were explained to the patient. Questions regarding the procedure were encouraged and answered. The patient understands and consents to the procedure. Patient was placed supine on the CT gantry and limited axial scans through the pelvis were obtained. Appropriate skin entry site was identified. Skin site was marked, prepped with  Betadine, draped in usual sterile fashion, and infiltrated locally with 1% lidocaine.  Intravenous Fentanyl and Versed were administered as conscious sedation during continuous cardiorespiratory monitoring by the radiology RN, with a total moderate sedation time of 6 minutes.  Under CT fluoroscopic guidance an 11-gauge Cook trocar bone needle was advanced into the right iliac bone just lateral to the sacroiliac joint. Once needle tip position was confirmed, coaxial core and aspiration samples were obtained. The final sample was obtained using the guiding needle itself, which was then removed. A second core sample was sent for culture. Post procedure scans show no hematoma or fracture. Patient tolerated procedure well, with no immediate complication.  IMPRESSION: 1. Technically successful CT guided right iliac bone core and aspiration biopsy.  Electronically Signed   By: Arne Cleveland M.D.   On: 04/23/2014 15:55   Dg Chest Port 1 View  04/21/2014   CLINICAL DATA:  Fever and cough.  EXAM: PORTABLE CHEST - 1 VIEW  COMPARISON:  04/14/2014.  FINDINGS: Mediastinum and hilar structures normal. Cardiomegaly. Pulmonary vascularity is normal. No pleural effusion or pneumothorax. Contrast in the colon.  IMPRESSION: 1. Mild cardiomegaly.  No CHF. 2. No acute pulmonary disease.   Electronically Signed   By: Marcello Moores  Register   On: 04/21/2014 13:53   Dg Bone Survey Met  04/19/2014   CLINICAL DATA:  Hypercalcemia.  Concern for multiple myeloma.  EXAM: METASTATIC BONE SURVEY  COMPARISON:  CT, 05/17/2013.  FINDINGS: There are no lytic/lucent bone lesions to suggest multiple myeloma. There are no osteoblastic lesions.  Bones are diffusely demineralized.  Moderate burst fracture of L1, unchanged from the prior CT. No other fractures.  IMPRESSION: 1. No evidence of multiple myeloma. No evidence of neoplastic disease to bone. 2. Chronic burst fracture of L1.  Diffuse bony demineralization.   Electronically Signed   By: Lajean Manes M.D.   On: 04/19/2014 08:43    Microbiology: Recent Results (from the past 240 hour(s))  CULTURE, BLOOD (ROUTINE X 2)     Status: None   Collection Time    04/20/14  5:50 AM      Result Value Ref Range Status   Specimen Description BLOOD RIGHT ARM   Final   Special Requests BOTTLES DRAWN AEROBIC ONLY 5CC   Final   Culture  Setup Time     Final   Value: 04/20/2014 14:51     Performed at [facility]   Culture     Final   Value: NO GROWTH 5 DAYS     Performed at [facility]   Report Status 04/26/2014 FINAL   Final  CULTURE, BLOOD (ROUTINE X 2)     Status: None   Collection Time    04/20/14  5:55 AM      Result Value Ref Range Status   Specimen Description BLOOD RIGHT ARM   Final   Special Requests BOTTLES DRAWN AEROBIC ONLY 10CC   Final   Culture  Setup Time     Final   Value: 04/20/2014 14:51     Performed at [facility]   Culture     Final   Value: NO GROWTH 5 DAYS     Note: Culture results may be compromised due to an excessive volume of blood received in culture bottles.     Performed at [facility]   Report Status 04/26/2014 FINAL   Final  URINE CULTURE     Status: None   Collection Time    04/20/14  3:00 PM      Result Value Ref Range Status   Specimen Description URINE, CATHETERIZED   Final   Special Requests NONE   Final   Culture  Setup Time     Final   Value: 04/19/2014 16:27     Performed at [place]     Final   Value: NO GROWTH     Performed at [facility]   Culture     Final   Value: NO GROWTH     Performed at [facility]   Report Status 04/22/2014 FINAL   Final  AFB CULTURE WITH SMEAR     Status: None   Collection Time  04/23/14 11:31 AM      Result Value Ref Range Status   Specimen Description LYMPH NODE   Final   Special Requests BONR MARROW BIOPSY   Final   Acid Fast Smear     Final   Value: NO ACID FAST BACILLI SEEN     Performed at [facility]    Culture     Final   Value: CULTURE WILL BE EXAMINED FOR 6 WEEKS BEFORE ISSUING A FINAL REPORT     Performed at [facility]   Report Status PENDING   Incomplete  FUNGUS CULTURE W SMEAR     Status: None   Collection Time    04/23/14 11:31 AM      Result Value Ref Range Status   Specimen Description BONE   Final   Special Requests BONE MARROW BIOPSY   Final   Fungal Smear     Final   Value: NO YEAST OR FUNGAL ELEMENTS SEEN     Performed at [facility]   Culture     Final   Value: CULTURE IN PROGRESS FOR FOUR WEEKS     Performed at [facility]   Report Status PENDING   Incomplete  CULTURE, BLOOD (ROUTINE X 2)     Status: None   Collection Time    04/24/14  6:29 AM      Result Value Ref Range Status   Specimen Description BLOOD LEFT ANTECUBITAL   Final   Special Requests BOTTLES DRAWN AEROBIC AND ANAEROBIC 10CC   Final   Culture  Setup Time     Final   Value: 04/24/2014 14:36     Performed at [facility]   Culture     Final   Value:        BLOOD CULTURE RECEIVED NO GROWTH TO DATE CULTURE WILL BE HELD FOR 5 DAYS BEFORE ISSUING A FINAL NEGATIVE REPORT     Performed at [facility]   Report Status PENDING   Incomplete  CULTURE, BLOOD (ROUTINE X 2)     Status: None   Collection Time    04/24/14  6:41 AM      Result Value Ref Range Status   Specimen Description BLOOD LEFT HAND   Final   Special Requests BOTTLES DRAWN AEROBIC AND ANAEROBIC 10CC   Final   Culture  Setup Time     Final   Value: 04/24/2014 14:37     Performed at [facility]   Culture     Final   Value:        BLOOD CULTURE RECEIVED NO GROWTH TO DATE CULTURE WILL BE HELD FOR 5 DAYS BEFORE ISSUING A FINAL NEGATIVE REPORT     Performed at [facility]   Report Status PENDING   Incomplete  URINE CULTURE     Status: None   Collection Time    04/24/14  7:39 PM      Result Value Ref Range Status   Specimen Description URINE, RANDOM   Final   Special Requests NONE    Final   Culture  Setup Time     Final   Value: 04/24/2014 22:05     Performed at [place]     Final   Value: NO GROWTH     Performed at [facility]   Culture     Final   Value: NO GROWTH     Performed at [facility]  Report Status 04/25/2014 FINAL   Final     Labs: Basic Metabolic Panel:  Recent Labs Lab 04/21/14 0622 04/22/14 0510 04/23/14 0634 04/26/14 0500 04/27/14 0042  NA 142 137 141 139 138  K 3.8 4.1 4.5 4.7 4.7  CL 108 101 100 99 98  CO2 21 25 28 29 29   GLUCOSE 77 92 91 76 80  BUN 38* 29* 32* 28* 26*  CREATININE 1.33* 1.35* 1.28* 1.11* 1.11*  CALCIUM 8.7 8.6 9.1 10.3 10.1  PHOS 2.1* 1.7* 2.1*  --   --    Liver Function Tests:  Recent Labs Lab 04/21/14 0622 04/22/14 0510 04/23/14 0634 04/27/14 0042  AST  --   --   --  47*  ALT  --   --   --  52*  ALKPHOS  --   --   --  56  BILITOT  --   --   --  <0.2*  PROT  --   --   --  6.3  ALBUMIN 2.1* 2.1* 2.4* 2.2*   No results found for this basename: LIPASE, AMYLASE,  in the last 168 hours No results found for this basename: AMMONIA,  in the last 168 hours CBC:  Recent Labs Lab 04/21/14 0622 04/22/14 0510 04/23/14 0634 04/24/14 0629 04/26/14 0500 04/27/14 0042  WBC 1.7* 2.2* 3.2* 3.3* 8.3 10.0  NEUTROABS 0.9* 1.1* 1.2* 1.2* 4.3  --   HGB 7.7* 7.5* 8.5* 7.7* 8.2* 8.0*  HCT 23.0* 22.5* 25.6* 24.1* 24.9* 23.8*  MCV 89.5 91.8 91.8 92.0 91.9 89.5  PLT 242 326 460* 533* 669* 673*   Cardiac Enzymes: No results found for this basename: CKTOTAL, CKMB, CKMBINDEX, TROPONINI,  in the last 168 hours BNP: BNP (last 3 results) No results found for this basename: PROBNP,  in the last 8760 hours CBG: No results found for this basename: GLUCAP,  in the last 168 hours     Signed:  Eulogio Bear  [facility] 04/27/2014, 11:33 AM

## 2014-04-27 LAB — COMPREHENSIVE METABOLIC PANEL
ALT: 52 U/L — ABNORMAL HIGH (ref 0–35)
AST: 47 U/L — AB (ref 0–37)
Albumin: 2.2 g/dL — ABNORMAL LOW (ref 3.5–5.2)
Alkaline Phosphatase: 56 U/L (ref 39–117)
Anion gap: 11 (ref 5–15)
BUN: 26 mg/dL — ABNORMAL HIGH (ref 6–23)
CO2: 29 meq/L (ref 19–32)
CREATININE: 1.11 mg/dL — AB (ref 0.50–1.10)
Calcium: 10.1 mg/dL (ref 8.4–10.5)
Chloride: 98 mEq/L (ref 96–112)
GFR, EST AFRICAN AMERICAN: 53 mL/min — AB (ref >90)
GFR, EST NON AFRICAN AMERICAN: 46 mL/min — AB (ref >90)
GLUCOSE: 80 mg/dL (ref 70–99)
Potassium: 4.7 mEq/L (ref 3.7–5.3)
SODIUM: 138 meq/L (ref 137–147)
Total Bilirubin: <0.2 mg/dL — ABNORMAL LOW (ref 0.3–1.2)
Total Protein: 6.3 g/dL (ref 6.0–8.3)

## 2014-04-27 LAB — DIFFERENTIAL
BASOS PCT: 5 % — AB (ref 0–1)
Basophils Absolute: 0.5 10*3/uL — ABNORMAL HIGH (ref 0.0–0.1)
Eosinophils Absolute: 0.3 10*3/uL (ref 0.0–0.7)
Eosinophils Relative: 3 % (ref 0–5)
Lymphocytes Relative: 29 % (ref 12–46)
Lymphs Abs: 2.9 10*3/uL (ref 0.7–4.0)
MONOS PCT: 20 % — AB (ref 3–12)
Monocytes Absolute: 2 10*3/uL — ABNORMAL HIGH (ref 0.1–1.0)
Neutro Abs: 4.3 10*3/uL (ref 1.7–7.7)
Neutrophils Relative %: 43 % (ref 43–77)

## 2014-04-27 LAB — CBC
HCT: 23.8 % — ABNORMAL LOW (ref 36.0–46.0)
Hemoglobin: 8 g/dL — ABNORMAL LOW (ref 12.0–15.0)
MCH: 30.1 pg (ref 26.0–34.0)
MCHC: 33.6 g/dL (ref 30.0–36.0)
MCV: 89.5 fL (ref 78.0–100.0)
PLATELETS: 673 10*3/uL — AB (ref 150–400)
RBC: 2.66 MIL/uL — AB (ref 3.87–5.11)
RDW: 17.9 % — ABNORMAL HIGH (ref 11.5–15.5)
WBC: 10 10*3/uL (ref 4.0–10.5)

## 2014-04-27 NOTE — Progress Notes (Signed)
Report called to [facility] at [phone], [facility] [place]. IV removed. Transport called. Will continue to monitor.

## 2014-04-29 LAB — PATHOLOGIST SMEAR REVIEW

## 2014-04-30 LAB — CULTURE, BLOOD (ROUTINE X 2)
CULTURE: NO GROWTH
Culture: NO GROWTH

## 2014-04-30 LAB — CHROMOSOME ANALYSIS, BONE MARROW

## 2014-05-07 ENCOUNTER — Ambulatory Visit (HOSPITAL_BASED_OUTPATIENT_CLINIC_OR_DEPARTMENT_OTHER): Payer: Medicare Other | Admitting: Hematology and Oncology

## 2014-05-07 ENCOUNTER — Ambulatory Visit (HOSPITAL_BASED_OUTPATIENT_CLINIC_OR_DEPARTMENT_OTHER): Payer: Medicare Other

## 2014-05-07 DIAGNOSIS — N189 Chronic kidney disease, unspecified: Secondary | ICD-10-CM

## 2014-05-07 DIAGNOSIS — D631 Anemia in chronic kidney disease: Secondary | ICD-10-CM

## 2014-05-07 LAB — CBC WITH DIFFERENTIAL/PLATELET
BASO%: 0.6 % (ref 0.0–2.0)
BASOS ABS: 0.1 10*3/uL (ref 0.0–0.1)
EOS ABS: 0.1 10*3/uL (ref 0.0–0.5)
EOS%: 1 % (ref 0.0–7.0)
HEMATOCRIT: 28.6 % — AB (ref 34.8–46.6)
HEMOGLOBIN: 9 g/dL — AB (ref 11.6–15.9)
LYMPH#: 1.3 10*3/uL (ref 0.9–3.3)
LYMPH%: 12.3 % — ABNORMAL LOW (ref 14.0–49.7)
MCH: 29.5 pg (ref 25.1–34.0)
MCHC: 31.5 g/dL (ref 31.5–36.0)
MCV: 93.8 fL (ref 79.5–101.0)
MONO#: 1.3 10*3/uL — ABNORMAL HIGH (ref 0.1–0.9)
MONO%: 11.9 % (ref 0.0–14.0)
NEUT%: 74.2 % (ref 38.4–76.8)
NEUTROS ABS: 8 10*3/uL — AB (ref 1.5–6.5)
Platelets: 468 10*3/uL — ABNORMAL HIGH (ref 145–400)
RBC: 3.05 10*6/uL — ABNORMAL LOW (ref 3.70–5.45)
RDW: 19.8 % — ABNORMAL HIGH (ref 11.2–14.5)
WBC: 10.8 10*3/uL — ABNORMAL HIGH (ref 3.9–10.3)

## 2014-05-07 LAB — FERRITIN CHCC: FERRITIN: 174 ng/mL (ref 9–269)

## 2014-05-07 LAB — IRON AND TIBC CHCC
%SAT: 51 % (ref 21–57)
Iron: 152 ug/dL — ABNORMAL HIGH (ref 41–142)
TIBC: 298 ug/dL (ref 236–444)
UIBC: 145 ug/dL (ref 120–384)

## 2014-05-07 MED ORDER — SODIUM CHLORIDE 0.9 % IV SOLN
90.0000 mg | Freq: Once | INTRAVENOUS | Status: AC
  Administered 2014-05-07: 90 mg via INTRAVENOUS
  Filled 2014-05-07: qty 10

## 2014-05-07 MED ORDER — SODIUM CHLORIDE 0.9 % IV SOLN
Freq: Once | INTRAVENOUS | Status: AC
  Administered 2014-05-07: 13:00:00 via INTRAVENOUS

## 2014-05-07 NOTE — Progress Notes (Signed)
Patient Care Team: [name], MD as PCP - General (Internal Medicine) [name], MD (Internal Medicine)  DIAGNOSIS:  Anemia of chronic kidney disease versus MDS Hypercalcemia of unclear etiology  CHIEF COMPLIANT: Fatigue  INTERVAL HISTORY: Zoe Robinson is a 80 year old Caucasian with above-mentioned history of hypercalcemia and severe pancytopenia for which he was the hospital. Her hypercalcemia improved with bisphosphonate therapy and while she was in the hospital she was found to have severe pancytopenia. The neutropenia resolved with daily Neupogen injections and is now high white blood cell count possibly because of prednisone therapy. She was found to have severe anemia with a hemoglobin of 7.58 g. Extensive workup including iron studies Z61 folic acid and a bone marrow biopsy all did not reveal any clear cut reason for the anemia. Her bone marrow biopsy did show increased cellularity. But her reticulocyte count was markedly diminished. This suggests a myelodysplastic syndrome versus hypoproliferative anemia. She does feel fatigued and tired and she is very hot place currently. She does have occasional constipation. But her mental function has improved remarkably. Previously when she has severe hypercalcemia she was nearly obtunded.  REVIEW OF SYSTEMS:   Constitutional: Denies fevers, chills , weight loss and fatigue Eyes: Denies blurriness of vision Ears, nose, mouth, throat, and face: Denies mucositis or sore throat Respiratory: Denies cough, dyspnea or wheezes Cardiovascular: Denies palpitation, chest discomfort or lower extremity swelling Gastrointestinal:  Denies nausea, heartburn or change in bowel habits Skin: Denies abnormal skin rashes Lymphatics: Denies new lymphadenopathy or easy bruising Neurological:Denies numbness, tingling or new weaknesses Behavioral/Psych: Mood is stable, no new changes  All other systems were reviewed with the patient and are negative.  I  have reviewed the past medical history, past surgical history, social history and family history with the patient and they are unchanged from previous note.  ALLERGIES:  has No Known Allergies.  MEDICATIONS:  Current Outpatient Prescriptions  Medication Sig Dispense Refill  . acetaminophen (TYLENOL) 500 MG tablet Take 1,000 mg by mouth every 8 (eight) hours.      . bisacodyl (DULCOLAX) 10 MG suppository Place 1 suppository (10 mg total) rectally daily as needed for moderate constipation.  12 suppository  0  . feeding supplement, ENSURE COMPLETE, (ENSURE COMPLETE) LIQD Take 237 mLs by mouth 3 (three) times daily between meals.      . ferrous sulfate 325 (65 FE) MG tablet Take 325 mg by mouth daily with breakfast.      . folic acid (FOLVITE) 1 MG tablet Take 1 mg by mouth daily.      . magnesium gluconate (MAGONATE) 500 MG tablet Take 500 mg by mouth daily.      . Multiple Vitamins-Minerals (MULTIVITAMIN PO) Take 1 tablet by mouth daily.      Marland Kitchen oxybutynin (DITROPAN-XL) 10 MG 24 hr tablet Take 10 mg by mouth daily.      . polyethylene glycol (MIRALAX / GLYCOLAX) packet Take 17 g by mouth daily as needed.  14 each  0  . predniSONE (DELTASONE) 5 MG tablet Take 5-10 mg by mouth every other day. Take 5 mg by mouth every other day alternating with 10 mg by mouth every other day.      . Rivaroxaban (XARELTO) 15 MG TABS tablet Take 15 mg by mouth daily.      . simvastatin (ZOCOR) 40 MG tablet Take 20 mg by mouth every evening.      . valsartan (DIOVAN) 320 MG tablet       . HYDROcodone-acetaminophen (  NORCO/VICODIN) 5-325 MG per tablet Take 1-2 tablets by mouth every 4 (four) hours as needed for moderate pain.  10 tablet  0   No current facility-administered medications for this visit.   Facility-Administered Medications Ordered in Other Visits  Medication Dose Route Frequency Provider Last Rate Last Dose  . 0.9 %  sodium chloride infusion   Intravenous Once Rulon Eisenmenger, MD      . pamidronate  (AREDIA) 90 mg in sodium chloride 0.9 % 500 mL IVPB  90 mg Intravenous Once Rulon Eisenmenger, MD        PHYSICAL EXAMINATION: ECOG PERFORMANCE STATUS: 1 - Symptomatic but completely ambulatory  Filed Vitals:   05/07/14 1103  BP: 137/65  Pulse: 70  Temp: 98.5 F (36.9 C)  Resp: 18   Filed Weights   05/07/14 1103  Weight: 97 lb 1.6 oz (44.044 kg)    GENERAL:alert, no distress and comfortable SKIN: skin color, texture, turgor are normal, no rashes or significant lesions EYES: normal, Conjunctiva are pink and non-injected, sclera clear OROPHARYNX:no exudate, no erythema and lips, buccal mucosa, and tongue normal  NECK: supple, thyroid normal size, non-tender, without nodularity LYMPH:  no palpable lymphadenopathy in the cervical, axillary or inguinal LUNGS: clear to auscultation and percussion with normal breathing effort HEART: regular rate & rhythm and no murmurs and no lower extremity edema ABDOMEN:abdomen soft, non-tender and normal bowel sounds Musculoskeletal:no cyanosis of digits and no clubbing  NEURO: alert & oriented x 3 with fluent speech, no focal motor/sensory deficits  LABORATORY DATA:  I have reviewed the data as listed   Chemistry      Component Value Date/Time   NA 138 04/27/2014 0042   K 4.7 04/27/2014 0042   CL 98 04/27/2014 0042   CO2 29 04/27/2014 0042   BUN 26* 04/27/2014 0042   CREATININE 1.11* 04/27/2014 0042   CREATININE 2.01* 04/20/2014 0500      Component Value Date/Time   CALCIUM 10.1 04/27/2014 0042   ALKPHOS 56 04/27/2014 0042   AST 47* 04/27/2014 0042   ALT 52* 04/27/2014 0042   BILITOT <0.2* 04/27/2014 0042       Lab Results  Component Value Date   WBC 10.8* 05/07/2014   HGB 9.0* 05/07/2014   HCT 28.6* 05/07/2014   MCV 93.8 05/07/2014   PLT 468* 05/07/2014   NEUTROABS 8.0* 05/07/2014     RADIOGRAPHIC STUDIES: I have personally reviewed the radiology reports and agreed with their findings. No results found.   ASSESSMENT & PLAN:   Hypercalcemia Corrected serum calcium is over 12: I recommend giving pamidronate today in order to prevent any major complications related to hypercalcemia as she had previously. We will request endocrinology consultation. Previous hospital workup was negative for hypovitaminosis D., negative for sarcoidosis, negative for myeloma, negative for hyperparathyroidism.  Anemia Severe normocytic anemia due to anemia of chronic kidney disease versus MDS: Previous hospital workup did not reveal iron deficiency or I09 or folic acid deficiencies. Bone marrow biopsy was hypocellular but did not show any clear evidence of abnormality. I suspect because of low particular site comment that it is a bone marrow dysfunction. I would like to retest her I am studies. If they are normal, then I would like to consider address treatment for presumed diagnosis of myelodysplastic syndrome versus anemia of chronic kidney disease (Her GFR is 15: Stage IV CKD) . Previous differential diagnosis was infection versus methotrexate.  Return to clinic in one month for followup   Orders Placed  This Encounter  Procedures  . CBC with Differential    Standing Status: Future     Number of Occurrences: 1     Standing Expiration Date: 05/07/2015  . Iron and TIBC [facility]    Standing Status: Future     Number of Occurrences: 1     Standing Expiration Date: 05/07/2015  . Ferritin    Standing Status: Future     Number of Occurrences: 1     Standing Expiration Date: 05/07/2015  . Ambulatory referral to Endocrinology    Referral Priority:  Routine    Referral Type:  Consultation    Referral Reason:  Specialty Services Required    Number of Visits Requested:  1   The patient has a good understanding of the overall plan. she agrees with it. She will call with any problems that may develop before her next visit here.  I spent 25 minutes counseling the patient face to face. The total time spent in the appointment was 30 minutes and  more than 50% was on counseling and review of test results    Rulon Eisenmenger, MD 05/07/2014 12:39 PM

## 2014-05-07 NOTE — Assessment & Plan Note (Addendum)
Severe normocytic anemia due to anemia of chronic kidney disease versus MDS: Previous hospital workup did not reveal iron deficiency or L73 or folic acid deficiencies. Bone marrow biopsy was hypocellular but did not show any clear evidence of abnormality. I suspect because of low particular site comment that it is a bone marrow dysfunction. I would like to retest her I am studies. If they are normal, then I would like to consider address treatment for presumed diagnosis of myelodysplastic syndrome versus anemia of chronic kidney disease (Her GFR is 15: Stage IV CKD) . Previous differential diagnosis was infection versus methotrexate.  Return to clinic in one month for followup

## 2014-05-07 NOTE — Assessment & Plan Note (Addendum)
Corrected serum calcium is over 12: I recommend giving pamidronate today in order to prevent any major complications related to hypercalcemia as she had previously. We will request endocrinology consultation. Previous hospital workup was negative for hypovitaminosis D., negative for sarcoidosis, negative for myeloma, negative for hyperparathyroidism.

## 2014-05-07 NOTE — Patient Instructions (Signed)
Pamidronate injection What is this medicine? PAMIDRONATE (pa mi DROE nate) slows calcium loss from bones. It is used to treat high calcium blood levels from cancer or Paget's disease. It is also used to treat bone pain and prevent fractures from certain cancers that have spread to the bone. This medicine may be used for other purposes; ask your health care provider or pharmacist if you have questions. COMMON BRAND NAME(S): Aredia What should I tell my health care provider before I take this medicine? They need to know if you have any of these conditions: -aspirin-sensitive asthma -dental disease -kidney disease -an unusual or allergic reaction to pamidronate, other medicines, foods, dyes, or preservatives -pregnant or trying to get pregnant -breast-feeding How should I use this medicine? This medicine is for infusion into a vein. It is given by a health care professional in a hospital or clinic setting. Talk to your pediatrician regarding the use of this medicine in children. This medicine is not approved for use in children. Overdosage: If you think you have taken too much of this medicine contact a poison control center or emergency room at once. NOTE: This medicine is only for you. Do not share this medicine with others. What if I miss a dose? This does not apply. What may interact with this medicine? -certain antibiotics given by injection -medicines for inflammation or pain like ibuprofen, naproxen -some diuretics like bumetanide, furosemide -cyclosporine -parathyroid hormone -tacrolimus -teriparatide -thalidomide This list may not describe all possible interactions. Give your health care provider a list of all the medicines, herbs, non-prescription drugs, or dietary supplements you use. Also tell them if you smoke, drink alcohol, or use illegal drugs. Some items may interact with your medicine. What should I watch for while using this medicine? Visit your doctor or health care  professional for regular checkups. It may be some time before you see the benefit from this medicine. Do not stop taking your medicine unless your doctor tells you to. Your doctor may order blood tests or other tests to see how you are doing. Women should inform their doctor if they wish to become pregnant or think they might be pregnant. There is a potential for serious side effects to an unborn child. Talk to your health care professional or pharmacist for more information. You should make sure that you get enough calcium and vitamin D while you are taking this medicine. Discuss the foods you eat and the vitamins you take with your health care professional. Some people who take this medicine have severe bone, joint, and/or muscle pain. This medicine may also increase your risk for a broken thigh bone. Tell your doctor right away if you have pain in your upper leg or groin. Tell your doctor if you have any pain that does not go away or that gets worse. What side effects may I notice from receiving this medicine? Side effects that you should report to your doctor or health care professional as soon as possible: -allergic reactions like skin rash, itching or hives, swelling of the face, lips, or tongue -black or tarry stools -changes in vision -eye inflammation, pain -high blood pressure -jaw pain, especially burning or cramping -muscle weakness -numb, tingling pain -swelling of feet or hands -trouble passing urine or change in the amount of urine -unable to move easily Side effects that usually do not require medical attention (report to your doctor or health care professional if they continue or are bothersome): -bone, joint, or muscle pain -constipation -dizzy, drowsy -  fever -headache -loss of appetite -nausea, vomiting -pain at site where injected This list may not describe all possible side effects. Call your doctor for medical advice about side effects. You may report side effects to  FDA at 1-800-FDA-1088. Where should I keep my medicine? This drug is given in a hospital or clinic and will not be stored at home. NOTE: This sheet is a summary. It may not cover all possible information. If you have questions about this medicine, talk to your doctor, pharmacist, or health care provider.  2015, [employer]vier/Gold Standard. (2011-01-08 08:49:49)  

## 2014-05-07 NOTE — Progress Notes (Signed)
Lab results rcvd by fax from [facility] pathology - copy of [facility] cytogenetic laboratory results dtd 04/23/14.  Copy to Dr Pamelia Hoit.  Original to scan.

## 2014-05-08 ENCOUNTER — Telehealth: Payer: Self-pay | Admitting: Hematology and Oncology

## 2014-05-08 ENCOUNTER — Telehealth: Payer: Self-pay | Admitting: *Deleted

## 2014-05-08 NOTE — Telephone Encounter (Signed)
Message copied by Wandalee Ferdinand on Wed May 08, 2014 11:28 AM ------      Message from: Almon Register      Created: Tue May 07, 2014  3:11 PM      Regarding: First time patient       First time Pamidronate.  Dr. Pamelia Hoit.  Call daughter Erskine Emery  [phone].  Daughter has some questions about if pt needs to see dr Pamelia Hoit again soon.   ------

## 2014-05-08 NOTE — Telephone Encounter (Signed)
, °

## 2014-05-08 NOTE — Telephone Encounter (Signed)
Left VM requesting daughter to call office with update on her mother's status since her infusion yesterday.

## 2014-05-09 ENCOUNTER — Telehealth: Payer: Self-pay

## 2014-05-09 ENCOUNTER — Other Ambulatory Visit: Payer: Self-pay | Admitting: Hematology and Oncology

## 2014-05-09 DIAGNOSIS — N184 Chronic kidney disease, stage 4 (severe): Secondary | ICD-10-CM

## 2014-05-09 NOTE — Telephone Encounter (Signed)
Request sent to Thelma Barge to pre-auth aranesp.

## 2014-05-09 NOTE — Telephone Encounter (Signed)
Per msg below - no pre-auth required.

## 2014-05-09 NOTE — Telephone Encounter (Signed)
Message copied by Lorri Frederick on Thu May 09, 2014  1:19 PM ------      Message from: Emilee Hero      Created: Thu May 09, 2014 11:50 AM      Regarding: RE: Aranesp       She is ok doesn't require precert.      ----- Message -----         From: Lorri Frederick, RN         Sent: 05/09/2014  11:25 AM           To: Emilee Hero      Subject: Aranesp                                                  Dr Pamelia Hoit would like to start her on Aranesp.  Can you get it pre-authorized?  Thanks Aliahna Statzer       ------

## 2014-05-10 ENCOUNTER — Telehealth: Payer: Self-pay | Admitting: Hematology and Oncology

## 2014-05-10 NOTE — Telephone Encounter (Signed)
, °

## 2014-05-14 ENCOUNTER — Other Ambulatory Visit (HOSPITAL_BASED_OUTPATIENT_CLINIC_OR_DEPARTMENT_OTHER): Payer: Medicare Other

## 2014-05-14 ENCOUNTER — Ambulatory Visit (HOSPITAL_BASED_OUTPATIENT_CLINIC_OR_DEPARTMENT_OTHER): Payer: Medicare Other

## 2014-05-14 ENCOUNTER — Other Ambulatory Visit: Payer: Self-pay | Admitting: Hematology and Oncology

## 2014-05-14 DIAGNOSIS — D631 Anemia in chronic kidney disease: Secondary | ICD-10-CM

## 2014-05-14 DIAGNOSIS — N189 Chronic kidney disease, unspecified: Secondary | ICD-10-CM

## 2014-05-14 DIAGNOSIS — N184 Chronic kidney disease, stage 4 (severe): Secondary | ICD-10-CM

## 2014-05-14 LAB — CBC WITH DIFFERENTIAL/PLATELET
BASO%: 1.2 % (ref 0.0–2.0)
Basophils Absolute: 0.1 10*3/uL (ref 0.0–0.1)
EOS ABS: 0 10*3/uL (ref 0.0–0.5)
EOS%: 0.1 % (ref 0.0–7.0)
HCT: 29.2 % — ABNORMAL LOW (ref 34.8–46.6)
HGB: 9.3 g/dL — ABNORMAL LOW (ref 11.6–15.9)
LYMPH%: 19.9 % (ref 14.0–49.7)
MCH: 30.3 pg (ref 25.1–34.0)
MCHC: 31.9 g/dL (ref 31.5–36.0)
MCV: 95.1 fL (ref 79.5–101.0)
MONO#: 0.6 10*3/uL (ref 0.1–0.9)
MONO%: 7.9 % (ref 0.0–14.0)
NEUT#: 5.4 10*3/uL (ref 1.5–6.5)
NEUT%: 70.9 % (ref 38.4–76.8)
Platelets: 378 10*3/uL (ref 145–400)
RBC: 3.07 10*6/uL — AB (ref 3.70–5.45)
RDW: 20.2 % — AB (ref 11.2–14.5)
WBC: 7.6 10*3/uL (ref 3.9–10.3)
lymph#: 1.5 10*3/uL (ref 0.9–3.3)

## 2014-05-14 LAB — BASIC METABOLIC PANEL (CC13)
Anion Gap: 11 mEq/L (ref 3–11)
BUN: 22.7 mg/dL (ref 7.0–26.0)
CO2: 25 mEq/L (ref 22–29)
Calcium: 10.7 mg/dL — ABNORMAL HIGH (ref 8.4–10.4)
Chloride: 106 mEq/L (ref 98–109)
Creatinine: 0.9 mg/dL (ref 0.6–1.1)
GLUCOSE: 136 mg/dL (ref 70–140)
POTASSIUM: 5.1 meq/L (ref 3.5–5.1)
SODIUM: 142 meq/L (ref 136–145)

## 2014-05-14 MED ORDER — DARBEPOETIN ALFA-POLYSORBATE 300 MCG/0.6ML IJ SOLN
300.0000 ug | Freq: Once | INTRAMUSCULAR | Status: AC
  Administered 2014-05-14: 300 ug via SUBCUTANEOUS
  Filled 2014-05-14: qty 0.6

## 2014-05-14 NOTE — Patient Instructions (Signed)
Darbepoetin Alfa injection What is this medicine? DARBEPOETIN ALFA (dar be POE e tin AL fa) helps your body make more red blood cells. It is used to treat anemia caused by chronic kidney failure and chemotherapy. This medicine may be used for other purposes; ask your health care provider or pharmacist if you have questions. COMMON BRAND NAME(S): Aranesp What should I tell my health care provider before I take this medicine? They need to know if you have any of these conditions: -blood clotting disorders or history of blood clots -cancer patient not on chemotherapy -cystic fibrosis -heart disease, such as angina, heart failure, or a history of a heart attack -hemoglobin level of 12 g/dL or greater -high blood pressure -low levels of folate, iron, or vitamin B12 -seizures -an unusual or allergic reaction to darbepoetin, erythropoietin, albumin, hamster proteins, latex, other medicines, foods, dyes, or preservatives -pregnant or trying to get pregnant -breast-feeding How should I use this medicine? This medicine is for injection into a vein or under the skin. It is usually given by a health care professional in a hospital or clinic setting. If you get this medicine at home, you will be taught how to prepare and give this medicine. Do not shake the solution before you withdraw a dose. Use exactly as directed. Take your medicine at regular intervals. Do not take your medicine more often than directed. It is important that you put your used needles and syringes in a special sharps container. Do not put them in a trash can. If you do not have a sharps container, call your pharmacist or healthcare provider to get one. Talk to your pediatrician regarding the use of this medicine in children. While this medicine may be used in children as young as 1 year for selected conditions, precautions do apply. Overdosage: If you think you have taken too much of this medicine contact a poison control center or  emergency room at once. NOTE: This medicine is only for you. Do not share this medicine with others. What if I miss a dose? If you miss a dose, take it as soon as you can. If it is almost time for your next dose, take only that dose. Do not take double or extra doses. What may interact with this medicine? Do not take this medicine with any of the following medications: -epoetin alfa This list may not describe all possible interactions. Give your health care provider a list of all the medicines, herbs, non-prescription drugs, or dietary supplements you use. Also tell them if you smoke, drink alcohol, or use illegal drugs. Some items may interact with your medicine. What should I watch for while using this medicine? Visit your prescriber or health care professional for regular checks on your progress and for the needed blood tests and blood pressure measurements. It is especially important for the doctor to make sure your hemoglobin level is in the desired range, to limit the risk of potential side effects and to give you the best benefit. Keep all appointments for any recommended tests. Check your blood pressure as directed. Ask your doctor what your blood pressure should be and when you should contact him or her. As your body makes more red blood cells, you may need to take iron, folic acid, or vitamin B supplements. Ask your doctor or health care provider which products are right for you. If you have kidney disease continue dietary restrictions, even though this medication can make you feel better. Talk with your doctor or health   care professional about the foods you eat and the vitamins that you take. What side effects may I notice from receiving this medicine? Side effects that you should report to your doctor or health care professional as soon as possible: -allergic reactions like skin rash, itching or hives, swelling of the face, lips, or tongue -breathing problems -changes in vision -chest  pain -confusion, trouble speaking or understanding -feeling faint or lightheaded, falls -high blood pressure -muscle aches or pains -pain, swelling, warmth in the leg -rapid weight gain -severe headaches -sudden numbness or weakness of the face, arm or leg -trouble walking, dizziness, loss of balance or coordination -seizures (convulsions) -swelling of the ankles, feet, hands -unusually weak or tired Side effects that usually do not require medical attention (report to your doctor or health care professional if they continue or are bothersome): -diarrhea -fever, chills (flu-like symptoms) -headaches -nausea, vomiting -redness, stinging, or swelling at site where injected This list may not describe all possible side effects. Call your doctor for medical advice about side effects. You may report side effects to FDA at 1-800-FDA-1088. Where should I keep my medicine? Keep out of the reach of children. Store in a refrigerator between 2 and 8 degrees C (36 and 46 degrees F). Do not freeze. Do not shake. Throw away any unused portion if using a single-dose vial. Throw away any unused medicine after the expiration date. NOTE: This sheet is a summary. It may not cover all possible information. If you have questions about this medicine, talk to your doctor, pharmacist, or health care provider.  2015, [employer]vier/[employer] Standard. (2008-06-25 10:23:57)  

## 2014-05-20 LAB — FUNGUS CULTURE W SMEAR: FUNGAL SMEAR: NONE SEEN

## 2014-05-27 ENCOUNTER — Other Ambulatory Visit: Payer: Self-pay | Admitting: *Deleted

## 2014-06-04 ENCOUNTER — Ambulatory Visit (HOSPITAL_BASED_OUTPATIENT_CLINIC_OR_DEPARTMENT_OTHER): Payer: Medicare Other | Admitting: Hematology and Oncology

## 2014-06-04 ENCOUNTER — Ambulatory Visit: Payer: Medicare Other

## 2014-06-04 ENCOUNTER — Telehealth: Payer: Self-pay | Admitting: Hematology and Oncology

## 2014-06-04 ENCOUNTER — Other Ambulatory Visit (HOSPITAL_BASED_OUTPATIENT_CLINIC_OR_DEPARTMENT_OTHER): Payer: Medicare Other

## 2014-06-04 DIAGNOSIS — D649 Anemia, unspecified: Secondary | ICD-10-CM

## 2014-06-04 DIAGNOSIS — D531 Other megaloblastic anemias, not elsewhere classified: Secondary | ICD-10-CM

## 2014-06-04 DIAGNOSIS — D508 Other iron deficiency anemias: Secondary | ICD-10-CM

## 2014-06-04 DIAGNOSIS — N184 Chronic kidney disease, stage 4 (severe): Secondary | ICD-10-CM

## 2014-06-04 LAB — CBC WITH DIFFERENTIAL/PLATELET
BASO%: 1 % (ref 0.0–2.0)
Basophils Absolute: 0.1 10*3/uL (ref 0.0–0.1)
EOS%: 0.3 % (ref 0.0–7.0)
Eosinophils Absolute: 0 10*3/uL (ref 0.0–0.5)
HCT: 34.5 % — ABNORMAL LOW (ref 34.8–46.6)
HGB: 11 g/dL — ABNORMAL LOW (ref 11.6–15.9)
LYMPH%: 28.5 % (ref 14.0–49.7)
MCH: 29.8 pg (ref 25.1–34.0)
MCHC: 31.9 g/dL (ref 31.5–36.0)
MCV: 93.6 fL (ref 79.5–101.0)
MONO#: 0.6 10*3/uL (ref 0.1–0.9)
MONO%: 8.6 % (ref 0.0–14.0)
NEUT%: 61.6 % (ref 38.4–76.8)
NEUTROS ABS: 4.5 10*3/uL (ref 1.5–6.5)
PLATELETS: 331 10*3/uL (ref 145–400)
RBC: 3.68 10*6/uL — ABNORMAL LOW (ref 3.70–5.45)
RDW: 16 % — ABNORMAL HIGH (ref 11.2–14.5)
WBC: 7.4 10*3/uL (ref 3.9–10.3)
lymph#: 2.1 10*3/uL (ref 0.9–3.3)

## 2014-06-04 LAB — BASIC METABOLIC PANEL (CC13)
Anion Gap: 6 mEq/L (ref 3–11)
BUN: 27.1 mg/dL — ABNORMAL HIGH (ref 7.0–26.0)
CO2: 30 meq/L — AB (ref 22–29)
Calcium: 10 mg/dL (ref 8.4–10.4)
Chloride: 105 mEq/L (ref 98–109)
Creatinine: 0.8 mg/dL (ref 0.6–1.1)
Glucose: 96 mg/dl (ref 70–140)
POTASSIUM: 5 meq/L (ref 3.5–5.1)
Sodium: 142 mEq/L (ref 136–145)

## 2014-06-04 NOTE — Assessment & Plan Note (Addendum)
Normocytic anemia: suspicious for myelodysplastic syndrome. Receive Aranesp treatment X one on 05/14/2014 Since hospital discharge patient has recovered spontaneously to a hemoglobin of 11 g today. Her symptoms have remarkably improved.and she feels well today. Her hypercalcemia is also improved with a calcium level of 10. We will repeat her CBC in 2 months and decide if Aranesp should be completely discontinued. At this point there is no further investigation is necessary they'll see her back in 5 months with another CBC

## 2014-06-04 NOTE — Progress Notes (Signed)
Patient Care Team: [name], MD as PCP - General (Internal Medicine) [name], MD (Internal Medicine)  DIAGNOSIS: anemia and hypercalcemia of unclear etiology: Anemia differential diagnosis MDS versus anemia of chronic disease  Prior treatment: Aranesp currently on hold because of hemoglobin is 11  CHIEF COMPLIANT: followup of anemia  INTERVAL HISTORY: Zoe Robinson is a 80 year old Caucasian lady with above-mentioned history of anemia required extensive hospitalization with anemia and hypercalcemia. She had a bone marrow biopsy done in the hospital which did not reveal any abnormalities. She did not have any plasma cell dyscrasia. She has remarkably recovered from her hospitalization and is here today accompanied by her daughter. She feels very well and denies any shortness of breath or fatigue. She received one dose of Aranesp treatment and had remarkable rise in hemoglobin from 9-11 g. So she does not require any further injections today.  REVIEW OF SYSTEMS:   Constitutional: Denies fevers, chills or abnormal weight loss Eyes: Denies blurriness of vision Ears, nose, mouth, throat, and face: Denies mucositis or sore throat Respiratory: Denies cough, dyspnea or wheezes Cardiovascular: Denies palpitation, chest discomfort or lower extremity swelling Gastrointestinal:  Denies nausea, heartburn or change in bowel habits Skin: Denies abnormal skin rashes Lymphatics: Denies new lymphadenopathy or easy bruising Neurological:Denies numbness, tingling or new weaknesses Behavioral/Psych: Mood is stable, no new changes  All other systems were reviewed with the patient and are negative.  I have reviewed the past medical history, past surgical history, social history and family history with the patient and they are unchanged from previous note.  ALLERGIES:  has No Known Allergies.  MEDICATIONS:  Current Outpatient Prescriptions  Medication Sig Dispense Refill  . acetaminophen  (TYLENOL) 500 MG tablet Take 1,000 mg by mouth every 8 (eight) hours.    . feeding supplement, ENSURE COMPLETE, (ENSURE COMPLETE) LIQD Take 237 mLs by mouth 3 (three) times daily between meals.    . ferrous sulfate 325 (65 FE) MG tablet Take 325 mg by mouth daily with breakfast.    . folic acid (FOLVITE) 1 MG tablet Take 1 mg by mouth daily.    . magnesium gluconate (MAGONATE) 500 MG tablet Take 500 mg by mouth daily.    . Multiple Vitamins-Minerals (MULTIVITAMIN PO) Take 1 tablet by mouth daily.    Marland Kitchen oxybutynin (DITROPAN-XL) 10 MG 24 hr tablet Take 10 mg by mouth daily.    . polyethylene glycol (MIRALAX / GLYCOLAX) packet Take 17 g by mouth daily as needed. 14 each 0  . predniSONE (DELTASONE) 5 MG tablet Take 5-10 mg by mouth every other day. Take 5 mg by mouth every other day alternating with 10 mg by mouth every other day.    . Rivaroxaban (XARELTO) 15 MG TABS tablet Take 15 mg by mouth daily.    . simvastatin (ZOCOR) 40 MG tablet Take 20 mg by mouth every evening.    . valsartan (DIOVAN) 320 MG tablet      No current facility-administered medications for this visit.    PHYSICAL EXAMINATION: ECOG PERFORMANCE STATUS: 1 - Symptomatic but completely ambulatory  Filed Vitals:   06/04/14 1526  BP: 145/62  Pulse: 63  Temp: 97.7 F (36.5 C)  Resp: 18   Filed Weights   06/04/14 1526  Weight: 101 lb 14.4 oz (46.222 kg)    GENERAL:alert, no distress and comfortable SKIN: skin color, texture, turgor are normal, no rashes or significant lesions EYES: normal, Conjunctiva are pink and non-injected, sclera clear OROPHARYNX:no exudate, no erythema and lips,  buccal mucosa, and tongue normal  NECK: supple, thyroid normal size, non-tender, without nodularity LYMPH:  no palpable lymphadenopathy in the cervical, axillary or inguinal LUNGS: clear to auscultation and percussion with normal breathing effort HEART: regular rate & rhythm and no murmurs and no lower extremity edema ABDOMEN:abdomen  soft, non-tender and normal bowel sounds Musculoskeletal:no cyanosis of digits and no clubbing  NEURO: alert & oriented x 3 with fluent speech, no focal motor/sensory deficits  LABORATORY DATA:  I have reviewed the data as listed   Chemistry      Component Value Date/Time   NA 142 06/04/2014 1509   NA 138 04/27/2014 0042   K 5.0 06/04/2014 1509   K 4.7 04/27/2014 0042   CL 98 04/27/2014 0042   CO2 30* 06/04/2014 1509   CO2 29 04/27/2014 0042   BUN 27.1* 06/04/2014 1509   BUN 26* 04/27/2014 0042   CREATININE 0.8 06/04/2014 1509   CREATININE 1.11* 04/27/2014 0042   CREATININE 2.01* 04/20/2014 0500      Component Value Date/Time   CALCIUM 10.0 06/04/2014 1509   CALCIUM 10.1 04/27/2014 0042   ALKPHOS 56 04/27/2014 0042   AST 47* 04/27/2014 0042   ALT 52* 04/27/2014 0042   BILITOT <0.2* 04/27/2014 0042       Lab Results  Component Value Date   WBC 7.4 06/04/2014   HGB 11.0* 06/04/2014   HCT 34.5* 06/04/2014   MCV 93.6 06/04/2014   PLT 331 06/04/2014   NEUTROABS 4.5 06/04/2014    ASSESSMENT & PLAN:  Anemia Normocytic anemia: suspicious for myelodysplastic syndrome. Receive Aranesp treatment X one on 05/14/2014 Since hospital discharge patient has recovered spontaneously to a hemoglobin of 11 g today. Her symptoms have remarkably improved.and she feels well today. Her hypercalcemia is also improved with a calcium level of 10. We will repeat her CBC in 2 months and decide if Aranesp should be completely discontinued. At this point there is no further investigation is necessary they'll see her back in 5 months with another CBC    Orders Placed This Encounter  Procedures  . CBC with Differential    Standing Status: Future     Number of Occurrences:      Standing Expiration Date: 06/04/2015  . Comprehensive metabolic panel (Cmet) - [facility]    Standing Status: Future     Number of Occurrences:      Standing Expiration Date: 06/04/2015  . CBC with Differential    Standing  Status: Future     Number of Occurrences:      Standing Expiration Date: 06/04/2015   The patient has a good understanding of the overall plan. she agrees with it. She will call with any problems that may develop before her next visit here.  I spent 15 minutes counseling the patient face to face. The total time spent in the appointment was 15 minutes and more than 50% was on counseling and review of test results    Rulon Eisenmenger, MD 06/04/2014 4:45 PM

## 2014-06-04 NOTE — Telephone Encounter (Signed)
per pof to sch pt appt-gave pt copy of sch °

## 2014-06-05 LAB — AFB CULTURE WITH SMEAR (NOT AT ARMC): Acid Fast Smear: NONE SEEN

## 2014-06-25 ENCOUNTER — Ambulatory Visit: Payer: Medicare Other

## 2014-06-25 ENCOUNTER — Telehealth: Payer: Self-pay

## 2014-06-25 ENCOUNTER — Other Ambulatory Visit: Payer: Medicare Other

## 2014-06-25 NOTE — Telephone Encounter (Signed)
Returned call to [name] re: appts.  Let her know next appt 1/12 145 lab 215 injection if needed.  She voiced understanding.   FU office visit in April not scheduled.  Pof sent.

## 2014-07-16 ENCOUNTER — Ambulatory Visit: Payer: Medicare Other

## 2014-07-16 ENCOUNTER — Ambulatory Visit: Payer: Medicare Other | Admitting: Hematology and Oncology

## 2014-07-16 ENCOUNTER — Other Ambulatory Visit: Payer: Medicare Other

## 2014-08-06 ENCOUNTER — Ambulatory Visit: Payer: Medicare Other

## 2014-08-06 ENCOUNTER — Other Ambulatory Visit (HOSPITAL_BASED_OUTPATIENT_CLINIC_OR_DEPARTMENT_OTHER): Payer: Medicare Other

## 2014-08-06 DIAGNOSIS — D508 Other iron deficiency anemias: Secondary | ICD-10-CM

## 2014-08-06 DIAGNOSIS — D649 Anemia, unspecified: Secondary | ICD-10-CM

## 2014-08-06 DIAGNOSIS — D531 Other megaloblastic anemias, not elsewhere classified: Secondary | ICD-10-CM

## 2014-08-06 LAB — COMPREHENSIVE METABOLIC PANEL (CC13)
ALK PHOS: 49 U/L (ref 40–150)
ALT: 16 U/L (ref 0–55)
AST: 25 U/L (ref 5–34)
Albumin: 3.5 g/dL (ref 3.5–5.0)
Anion Gap: 10 mEq/L (ref 3–11)
BUN: 23.3 mg/dL (ref 7.0–26.0)
CO2: 28 meq/L (ref 22–29)
Calcium: 9.8 mg/dL (ref 8.4–10.4)
Chloride: 104 mEq/L (ref 98–109)
Creatinine: 0.9 mg/dL (ref 0.6–1.1)
EGFR: 64 mL/min/{1.73_m2} — ABNORMAL LOW (ref >90)
Glucose: 110 mg/dl (ref 70–140)
Potassium: 4.8 mEq/L (ref 3.5–5.1)
SODIUM: 142 meq/L (ref 136–145)
TOTAL PROTEIN: 7.8 g/dL (ref 6.4–8.3)

## 2014-08-06 LAB — CBC WITH DIFFERENTIAL/PLATELET
BASO%: 1 % (ref 0.0–2.0)
Basophils Absolute: 0.1 10*3/uL (ref 0.0–0.1)
EOS ABS: 0 10*3/uL (ref 0.0–0.5)
EOS%: 0.2 % (ref 0.0–7.0)
HCT: 36.6 % (ref 34.8–46.6)
HGB: 11.5 g/dL — ABNORMAL LOW (ref 11.6–15.9)
LYMPH%: 18 % (ref 14.0–49.7)
MCH: 27.8 pg (ref 25.1–34.0)
MCHC: 31.4 g/dL — AB (ref 31.5–36.0)
MCV: 88.5 fL (ref 79.5–101.0)
MONO#: 0.7 10*3/uL (ref 0.1–0.9)
MONO%: 6.2 % (ref 0.0–14.0)
NEUT%: 74.6 % (ref 38.4–76.8)
NEUTROS ABS: 8.5 10*3/uL — AB (ref 1.5–6.5)
PLATELETS: 374 10*3/uL (ref 145–400)
RBC: 4.13 10*6/uL (ref 3.70–5.45)
RDW: 13.1 % (ref 11.2–14.5)
WBC: 11.3 10*3/uL — ABNORMAL HIGH (ref 3.9–10.3)
lymph#: 2 10*3/uL (ref 0.9–3.3)

## 2014-08-06 NOTE — Progress Notes (Signed)
Hgb: 11.5.  Patient aware. No aranesp given.  Schedule printed and given to patient.

## 2014-08-18 DIAGNOSIS — M069 Rheumatoid arthritis, unspecified: Secondary | ICD-10-CM | POA: Diagnosis present

## 2014-08-18 DIAGNOSIS — Z7952 Long term (current) use of systemic steroids: Secondary | ICD-10-CM

## 2014-08-18 DIAGNOSIS — G9341 Metabolic encephalopathy: Secondary | ICD-10-CM | POA: Diagnosis present

## 2014-08-18 DIAGNOSIS — Z800 Family history of malignant neoplasm of digestive organs: Secondary | ICD-10-CM

## 2014-08-18 DIAGNOSIS — R4182 Altered mental status, unspecified: Secondary | ICD-10-CM | POA: Diagnosis not present

## 2014-08-18 DIAGNOSIS — K5900 Constipation, unspecified: Secondary | ICD-10-CM | POA: Diagnosis present

## 2014-08-18 DIAGNOSIS — I480 Paroxysmal atrial fibrillation: Secondary | ICD-10-CM | POA: Diagnosis present

## 2014-08-18 DIAGNOSIS — N183 Chronic kidney disease, stage 3 (moderate): Secondary | ICD-10-CM | POA: Diagnosis present

## 2014-08-18 DIAGNOSIS — A419 Sepsis, unspecified organism: Secondary | ICD-10-CM | POA: Diagnosis not present

## 2014-08-18 DIAGNOSIS — N390 Urinary tract infection, site not specified: Secondary | ICD-10-CM | POA: Diagnosis present

## 2014-08-18 DIAGNOSIS — Z87891 Personal history of nicotine dependence: Secondary | ICD-10-CM

## 2014-08-18 DIAGNOSIS — Z7901 Long term (current) use of anticoagulants: Secondary | ICD-10-CM

## 2014-08-18 DIAGNOSIS — Z8249 Family history of ischemic heart disease and other diseases of the circulatory system: Secondary | ICD-10-CM

## 2014-08-18 DIAGNOSIS — B9620 Unspecified Escherichia coli [E. coli] as the cause of diseases classified elsewhere: Secondary | ICD-10-CM | POA: Diagnosis present

## 2014-08-18 DIAGNOSIS — I129 Hypertensive chronic kidney disease with stage 1 through stage 4 chronic kidney disease, or unspecified chronic kidney disease: Secondary | ICD-10-CM | POA: Diagnosis present

## 2014-08-19 ENCOUNTER — Inpatient Hospital Stay (HOSPITAL_COMMUNITY)
Admission: EM | Admit: 2014-08-19 | Discharge: 2014-08-21 | DRG: 871 | Disposition: A | Discharge status: Home / Self Care | Payer: Medicare Other | Attending: Internal Medicine | Admitting: Internal Medicine

## 2014-08-19 ENCOUNTER — Emergency Department (HOSPITAL_COMMUNITY): Payer: Medicare Other

## 2014-08-19 ENCOUNTER — Encounter (HOSPITAL_COMMUNITY): Payer: Self-pay | Admitting: Emergency Medicine

## 2014-08-19 DIAGNOSIS — R4182 Altered mental status, unspecified: Secondary | ICD-10-CM

## 2014-08-19 DIAGNOSIS — N390 Urinary tract infection, site not specified: Secondary | ICD-10-CM | POA: Diagnosis present

## 2014-08-19 DIAGNOSIS — Z7952 Long term (current) use of systemic steroids: Secondary | ICD-10-CM | POA: Diagnosis not present

## 2014-08-19 DIAGNOSIS — I4891 Unspecified atrial fibrillation: Secondary | ICD-10-CM | POA: Diagnosis present

## 2014-08-19 DIAGNOSIS — Z8249 Family history of ischemic heart disease and other diseases of the circulatory system: Secondary | ICD-10-CM | POA: Diagnosis not present

## 2014-08-19 DIAGNOSIS — I480 Paroxysmal atrial fibrillation: Secondary | ICD-10-CM | POA: Diagnosis present

## 2014-08-19 DIAGNOSIS — Z800 Family history of malignant neoplasm of digestive organs: Secondary | ICD-10-CM | POA: Diagnosis not present

## 2014-08-19 DIAGNOSIS — A419 Sepsis, unspecified organism: Secondary | ICD-10-CM | POA: Diagnosis present

## 2014-08-19 DIAGNOSIS — M069 Rheumatoid arthritis, unspecified: Secondary | ICD-10-CM

## 2014-08-19 DIAGNOSIS — I129 Hypertensive chronic kidney disease with stage 1 through stage 4 chronic kidney disease, or unspecified chronic kidney disease: Secondary | ICD-10-CM | POA: Diagnosis present

## 2014-08-19 DIAGNOSIS — G9340 Encephalopathy, unspecified: Secondary | ICD-10-CM

## 2014-08-19 DIAGNOSIS — K5900 Constipation, unspecified: Secondary | ICD-10-CM | POA: Diagnosis present

## 2014-08-19 DIAGNOSIS — Z87891 Personal history of nicotine dependence: Secondary | ICD-10-CM | POA: Diagnosis not present

## 2014-08-19 DIAGNOSIS — B9620 Unspecified Escherichia coli [E. coli] as the cause of diseases classified elsewhere: Secondary | ICD-10-CM | POA: Diagnosis present

## 2014-08-19 DIAGNOSIS — G9341 Metabolic encephalopathy: Secondary | ICD-10-CM | POA: Diagnosis present

## 2014-08-19 DIAGNOSIS — T8351XS Infection and inflammatory reaction due to indwelling urinary catheter, sequela: Secondary | ICD-10-CM

## 2014-08-19 DIAGNOSIS — Z7901 Long term (current) use of anticoagulants: Secondary | ICD-10-CM | POA: Diagnosis not present

## 2014-08-19 DIAGNOSIS — N184 Chronic kidney disease, stage 4 (severe): Secondary | ICD-10-CM

## 2014-08-19 DIAGNOSIS — N183 Chronic kidney disease, stage 3 (moderate): Secondary | ICD-10-CM | POA: Diagnosis present

## 2014-08-19 DIAGNOSIS — N189 Chronic kidney disease, unspecified: Secondary | ICD-10-CM | POA: Diagnosis present

## 2014-08-19 LAB — CBC
HEMATOCRIT: 31.3 % — AB (ref 36.0–46.0)
HEMOGLOBIN: 10.4 g/dL — AB (ref 12.0–15.0)
MCH: 28.3 pg (ref 26.0–34.0)
MCHC: 33.2 g/dL (ref 30.0–36.0)
MCV: 85.3 fL (ref 78.0–100.0)
Platelets: 334 10*3/uL (ref 150–400)
RBC: 3.67 MIL/uL — AB (ref 3.87–5.11)
RDW: 13.4 % (ref 11.5–15.5)
WBC: 13.2 10*3/uL — AB (ref 4.0–10.5)

## 2014-08-19 LAB — URINALYSIS, ROUTINE W REFLEX MICROSCOPIC
BILIRUBIN URINE: NEGATIVE
Glucose, UA: NEGATIVE mg/dL
KETONES UR: NEGATIVE mg/dL
Nitrite: POSITIVE — AB
Protein, ur: NEGATIVE mg/dL
SPECIFIC GRAVITY, URINE: 1.007 (ref 1.005–1.030)
Urobilinogen, UA: 0.2 mg/dL (ref 0.0–1.0)
pH: 7 (ref 5.0–8.0)

## 2014-08-19 LAB — CBC WITH DIFFERENTIAL/PLATELET
BASOS ABS: 0 10*3/uL (ref 0.0–0.1)
BASOS PCT: 0 % (ref 0–1)
Eosinophils Absolute: 0 10*3/uL (ref 0.0–0.7)
Eosinophils Relative: 0 % (ref 0–5)
HCT: 30.9 % — ABNORMAL LOW (ref 36.0–46.0)
Hemoglobin: 10.2 g/dL — ABNORMAL LOW (ref 12.0–15.0)
LYMPHS ABS: 1.5 10*3/uL (ref 0.7–4.0)
LYMPHS PCT: 11 % — AB (ref 12–46)
MCH: 28.6 pg (ref 26.0–34.0)
MCHC: 33 g/dL (ref 30.0–36.0)
MCV: 86.6 fL (ref 78.0–100.0)
Monocytes Absolute: 0.8 10*3/uL (ref 0.1–1.0)
Monocytes Relative: 6 % (ref 3–12)
NEUTROS ABS: 11.4 10*3/uL — AB (ref 1.7–7.7)
NEUTROS PCT: 83 % — AB (ref 43–77)
Platelets: 340 10*3/uL (ref 150–400)
RBC: 3.57 MIL/uL — AB (ref 3.87–5.11)
RDW: 13.2 % (ref 11.5–15.5)
WBC: 13.7 10*3/uL — ABNORMAL HIGH (ref 4.0–10.5)

## 2014-08-19 LAB — COMPREHENSIVE METABOLIC PANEL
ALBUMIN: 2.6 g/dL — AB (ref 3.5–5.2)
ALT: 24 U/L (ref 0–35)
ANION GAP: 5 (ref 5–15)
AST: 30 U/L (ref 0–37)
Alkaline Phosphatase: 65 U/L (ref 39–117)
BUN: 25 mg/dL — AB (ref 6–23)
CHLORIDE: 101 mmol/L (ref 96–112)
CO2: 30 mmol/L (ref 19–32)
Calcium: 8.9 mg/dL (ref 8.4–10.5)
Creatinine, Ser: 1.18 mg/dL — ABNORMAL HIGH (ref 0.50–1.10)
GFR calc Af Amer: 49 mL/min — ABNORMAL LOW (ref >90)
GFR calc non Af Amer: 42 mL/min — ABNORMAL LOW (ref >90)
Glucose, Bld: 150 mg/dL — ABNORMAL HIGH (ref 70–99)
POTASSIUM: 4.3 mmol/L (ref 3.5–5.1)
Sodium: 136 mmol/L (ref 135–145)
Total Bilirubin: 0.5 mg/dL (ref 0.3–1.2)
Total Protein: 6.8 g/dL (ref 6.0–8.3)

## 2014-08-19 LAB — BASIC METABOLIC PANEL
Anion gap: 8 (ref 5–15)
BUN: 22 mg/dL (ref 6–23)
CO2: 32 mmol/L (ref 19–32)
CREATININE: 1.06 mg/dL (ref 0.50–1.10)
Calcium: 9 mg/dL (ref 8.4–10.5)
Chloride: 100 mmol/L (ref 96–112)
GFR calc Af Amer: 56 mL/min — ABNORMAL LOW (ref >90)
GFR, EST NON AFRICAN AMERICAN: 48 mL/min — AB (ref >90)
GLUCOSE: 131 mg/dL — AB (ref 70–99)
Potassium: 4.1 mmol/L (ref 3.5–5.1)
SODIUM: 140 mmol/L (ref 135–145)

## 2014-08-19 LAB — URINE MICROSCOPIC-ADD ON

## 2014-08-19 LAB — TROPONIN I: TROPONIN I: 0.03 ng/mL (ref <0.031)

## 2014-08-19 LAB — PROTIME-INR
INR: 1.29 (ref 0.00–1.49)
Prothrombin Time: 16.2 seconds — ABNORMAL HIGH (ref 11.6–15.2)

## 2014-08-19 LAB — MAGNESIUM: Magnesium: 2 mg/dL (ref 1.5–2.5)

## 2014-08-19 MED ORDER — ONDANSETRON HCL 4 MG PO TABS: 4.0000 mg | ORAL_TABLET | Freq: Four times a day (QID) | ORAL | Status: DC | PRN

## 2014-08-19 MED ORDER — OXYBUTYNIN CHLORIDE ER 10 MG PO TB24
10.0000 mg | ORAL_TABLET | Freq: Every day | ORAL | Status: DC
  Administered 2014-08-19 – 2014-08-21 (×3): 10 mg via ORAL
  Filled 2014-08-19 (×3): qty 1

## 2014-08-19 MED ORDER — PREDNISONE 5 MG PO TABS
5.0000 mg | ORAL_TABLET | ORAL | Status: DC
  Administered 2014-08-20: 5 mg via ORAL
  Filled 2014-08-19: qty 1

## 2014-08-19 MED ORDER — RIVAROXABAN 15 MG PO TABS
15.0000 mg | ORAL_TABLET | Freq: Every day | ORAL | Status: DC
  Administered 2014-08-19 – 2014-08-20 (×2): 15 mg via ORAL
  Filled 2014-08-19 (×2): qty 1

## 2014-08-19 MED ORDER — DEXTROSE 5 % IV SOLN
1.0000 g | Freq: Once | INTRAVENOUS | Status: AC
  Administered 2014-08-19: 1 g via INTRAVENOUS
  Filled 2014-08-19: qty 10

## 2014-08-19 MED ORDER — SODIUM CHLORIDE 0.9 % IJ SOLN
3.0000 mL | Freq: Two times a day (BID) | INTRAMUSCULAR | Status: DC
  Administered 2014-08-21: 3 mL via INTRAVENOUS

## 2014-08-19 MED ORDER — HYDROMORPHONE HCL 1 MG/ML IJ SOLN: 0.5000 mg | INTRAMUSCULAR | Status: DC | PRN

## 2014-08-19 MED ORDER — FOLIC ACID 1 MG PO TABS
1.0000 mg | ORAL_TABLET | Freq: Every day | ORAL | Status: DC
  Administered 2014-08-19 – 2014-08-21 (×3): 1 mg via ORAL
  Filled 2014-08-19 (×3): qty 1

## 2014-08-19 MED ORDER — SODIUM CHLORIDE 0.9 % IJ SOLN: 3.0000 mL | INTRAMUSCULAR | Status: DC | PRN

## 2014-08-19 MED ORDER — OXYCODONE HCL 5 MG PO TABS: 5.0000 mg | ORAL_TABLET | ORAL | Status: DC | PRN

## 2014-08-19 MED ORDER — ACETAMINOPHEN 650 MG RE SUPP: 650.0000 mg | Freq: Four times a day (QID) | RECTAL | Status: DC | PRN

## 2014-08-19 MED ORDER — ONDANSETRON HCL 4 MG/2ML IJ SOLN
4.0000 mg | Freq: Four times a day (QID) | INTRAMUSCULAR | Status: DC | PRN
Start: 2014-08-19 — End: 2014-08-21

## 2014-08-19 MED ORDER — VITAMIN E 180 MG (400 UNIT) PO CAPS
1000.0000 [IU] | ORAL_CAPSULE | Freq: Every day | ORAL | Status: DC
  Administered 2014-08-19 – 2014-08-21 (×3): 1000 [IU] via ORAL
  Filled 2014-08-19 (×3): qty 2

## 2014-08-19 MED ORDER — DEXTROSE 5 % IV SOLN
1.0000 g | INTRAVENOUS | Status: DC
  Administered 2014-08-20 – 2014-08-21 (×2): 1 g via INTRAVENOUS
  Filled 2014-08-19 (×2): qty 10

## 2014-08-19 MED ORDER — MAGNESIUM GLUCONATE 500 MG PO TABS
500.0000 mg | ORAL_TABLET | Freq: Every day | ORAL | Status: DC
  Administered 2014-08-19 – 2014-08-21 (×3): 500 mg via ORAL
  Filled 2014-08-19 (×3): qty 1

## 2014-08-19 MED ORDER — HYDROCORTISONE NA SUCCINATE PF 100 MG IJ SOLR
50.0000 mg | Freq: Once | INTRAMUSCULAR | Status: AC
  Administered 2014-08-19: 50 mg via INTRAVENOUS
  Filled 2014-08-19: qty 2

## 2014-08-19 MED ORDER — SODIUM CHLORIDE 0.9 % IV SOLN
250.0000 mL | INTRAVENOUS | Status: DC | PRN
  Administered 2014-08-19: 250 mL via INTRAVENOUS

## 2014-08-19 MED ORDER — SODIUM CHLORIDE 0.9 % IV SOLN
INTRAVENOUS | Status: DC
  Administered 2014-08-19: 75 mL/h via INTRAVENOUS
  Administered 2014-08-19 – 2014-08-21 (×3): via INTRAVENOUS

## 2014-08-19 MED ORDER — POLYETHYLENE GLYCOL 3350 17 G PO PACK: 17.0000 g | PACK | Freq: Every day | ORAL | Status: DC | PRN

## 2014-08-19 MED ORDER — PANTOPRAZOLE SODIUM 40 MG PO TBEC
40.0000 mg | DELAYED_RELEASE_TABLET | Freq: Every day | ORAL | Status: DC
  Administered 2014-08-19 – 2014-08-21 (×3): 40 mg via ORAL
  Filled 2014-08-19 (×3): qty 1

## 2014-08-19 MED ORDER — SIMVASTATIN 20 MG PO TABS
20.0000 mg | ORAL_TABLET | Freq: Every evening | ORAL | Status: DC
  Administered 2014-08-19 – 2014-08-20 (×2): 20 mg via ORAL
  Filled 2014-08-19 (×2): qty 1

## 2014-08-19 MED ORDER — SODIUM CHLORIDE 0.9 % IV SOLN
250.0000 mL | INTRAVENOUS | Status: AC | PRN
  Administered 2014-08-19: 250 mL via INTRAVENOUS
  Administered 2014-08-19: 75 mL via INTRAVENOUS

## 2014-08-19 MED ORDER — FERROUS SULFATE 325 (65 FE) MG PO TABS
325.0000 mg | ORAL_TABLET | Freq: Every day | ORAL | Status: DC
  Administered 2014-08-19 – 2014-08-21 (×3): 325 mg via ORAL
  Filled 2014-08-19 (×3): qty 1

## 2014-08-19 MED ORDER — IRBESARTAN 300 MG PO TABS
300.0000 mg | ORAL_TABLET | Freq: Every day | ORAL | Status: DC
  Administered 2014-08-19: 300 mg via ORAL
  Filled 2014-08-19: qty 1

## 2014-08-19 MED ORDER — SODIUM CHLORIDE 0.9 % IV BOLUS (SEPSIS)
100.0000 mL | Freq: Once | INTRAVENOUS | Status: AC
  Administered 2014-08-19: 100 mL via INTRAVENOUS

## 2014-08-19 MED ORDER — ACETAMINOPHEN 325 MG PO TABS
650.0000 mg | ORAL_TABLET | Freq: Four times a day (QID) | ORAL | Status: DC | PRN
  Administered 2014-08-19: 650 mg via ORAL
  Filled 2014-08-19: qty 2

## 2014-08-19 MED ORDER — PREDNISONE 5 MG PO TABS
10.0000 mg | ORAL_TABLET | ORAL | Status: DC
  Administered 2014-08-19 – 2014-08-21 (×2): 10 mg via ORAL
  Filled 2014-08-19 (×2): qty 2

## 2014-08-19 MED ORDER — DEXTROSE 5 % IV SOLN: 1.0000 g | INTRAVENOUS | Status: DC

## 2014-08-19 NOTE — H&P (Signed)
[facility] Admission History and Physical       [name] OEV:[id number] DOB: [birth year] DOA: 08/19/2014  Referring physician: EDP PCP: Lindwood Qua, MD  Specialists:   Chief Complaint: Confusion  HPI: Zoe Robinson is a 81 y.o. female with a history of Paroxsymal Atrial Fibrillation, hypercalcemia, Rheumatoid Arthritis, CKD  Who presents to the ED with worsening confusion over the past 2 weeks.   Patient denies a fevers and chills.  She was found to have a +UA and was placed on IV Rocephin and referred for admission.     Review of Systems:  Constitutional: No Weight Loss, No Weight Gain, Night Sweats, Fevers, Chills, Dizziness, Fatigue, or Generalized Weakness HEENT: No Headaches, Difficulty Swallowing,Tooth/Dental Problems,Sore Throat,  No Sneezing, Rhinitis, Ear Ache, Nasal Congestion, or Post Nasal Drip,  Cardio-vascular:  No Chest pain, Orthopnea, PND, Edema in Lower Extremities, Anasarca, Dizziness, Palpitations  Resp: No Dyspnea, No DOE, No Productive Cough, No Non-Productive Cough, No Hemoptysis, No Wheezing.    GI: No Heartburn, Indigestion, Abdominal Pain, Nausea, Vomiting, Diarrhea, Hematemesis, Hematochezia, Melena, Change in Bowel Habits,  Loss of Appetite  GU: No Dysuria, Change in Color of Urine, No Urgency or Frequency, No Flank pain.  Musculoskeletal: No Joint Pain or Swelling, No Decreased Range of Motion, No Back Pain.  Neurologic: No Syncope, No Seizures, Muscle Weakness, Paresthesia, Vision Disturbance or Loss, No Diplopia, No Vertigo, No Difficulty Walking,  Skin: No Rash or Lesions. Psych: No Change in Mood or Affect, No Depression or Anxiety, No Memory loss, +Confusion, or Hallucinations   Past Medical History  Diagnosis Date  . Atrial fibrillation   . Arthritis   . Hypertension   . CKD (chronic kidney disease)   . RA (rheumatoid arthritis)       Past Surgical History  Procedure Laterality Date  . Abdominal hysterectomy          Prior to Admission medications   Medication Sig Start Date End Date Taking? Authorizing Provider  acetaminophen (TYLENOL) 500 MG tablet Take 1,000 mg by mouth every 8 (eight) hours.   Yes Historical Provider, MD  feeding supplement, ENSURE COMPLETE, (ENSURE COMPLETE) LIQD Take 237 mLs by mouth 3 (three) times daily between meals. 04/26/14  Yes Joseph Art, DO  ferrous sulfate 325 (65 FE) MG tablet Take 325 mg by mouth daily with breakfast.   Yes Historical Provider, MD  folic acid (FOLVITE) 1 MG tablet Take 1 mg by mouth every morning.    Yes Historical Provider, MD  magnesium gluconate (MAGONATE) 500 MG tablet Take 500 mg by mouth every morning.    Yes Historical Provider, MD  Multiple Vitamins-Minerals (MULTIVITAMIN PO) Take 1 tablet by mouth every morning.    Yes Historical Provider, MD  oxybutynin (DITROPAN-XL) 10 MG 24 hr tablet Take 10 mg by mouth every morning.    Yes Historical Provider, MD  polyethylene glycol (MIRALAX / GLYCOLAX) packet Take 17 g by mouth daily as needed. Patient taking differently: Take 17 g by mouth daily as needed for mild constipation.  04/26/14  Yes Joseph Art, DO  predniSONE (DELTASONE) 5 MG tablet Take 5-10 mg by mouth every other day. Take 5 mg by mouth every other day alternating with 10 mg by mouth every other day.   Yes Historical Provider, MD  Rivaroxaban (XARELTO) 15 MG TABS tablet Take 15 mg by mouth every evening.    Yes Historical Provider, MD  simvastatin (ZOCOR) 40 MG tablet Take 20 mg by mouth every evening.  Yes Historical Provider, MD  valsartan (DIOVAN) 320 MG tablet Take 320 mg by mouth every morning.  03/29/14  Yes Historical Provider, MD  vitamin E 1000 UNIT capsule Take 1,000 Units by mouth every morning.   Yes Historical Provider, MD      No Known Allergies   Social History:  reports that she quit smoking about 50 years ago. She does not have any smokeless tobacco history on file. She reports that she does not drink alcohol or  use illicit drugs.     Family History  Problem Relation Age of Onset  . Heart attack Mother   . Heart attack Father   . Heart attack Brother   . Heart attack Brother   . Stomach cancer Brother        Physical Exam:  GEN:  Pleasant Elderly Frail Thin   81 y.o.  Caucasian female examined  and in no acute distress; cooperative with exam Filed Vitals:   08/19/14 0030 08/19/14 0054 08/19/14 0130 08/19/14 0215  BP: 104/45  110/48 116/51  Pulse: 82  72 68  Temp:  99.5 F (37.5 C)    TempSrc:  Rectal    Resp: 18  27 19   Height:      Weight:      SpO2: 95%  97% 95%   Blood pressure 116/51, pulse 68, temperature 99.5 F (37.5 C), temperature source Rectal, resp. rate 19, height 4\' 11"  (1.499 m), weight 48.535 kg (107 lb), SpO2 95 %. PSYCH: She is alert and oriented x 3; does not appear anxious does not appear depressed; affect is normal HEENT: Normocephalic and Atraumatic, Mucous membranes pink; PERRLA; EOM intact; Fundi:  Benign;  No scleral icterus, Nares: Patent, Oropharynx: Clear, Edentulous with Dentures Present,    Neck:  FROM, No Cervical Lymphadenopathy nor Thyromegaly or Carotid Bruit; No JVD; Breasts:: Not examined CHEST WALL: No tenderness CHEST: Normal respiration, clear to auscultation bilaterally HEART: Regular rate and rhythm; no murmurs rubs or gallops BACK: No kyphosis or scoliosis; No CVA tenderness ABDOMEN: Positive Bowel Sounds, Soft Non-Tender; No Masses, No Organomegaly. Rectal Exam: Not done EXTREMITIES: No Cyanosis, Clubbing, or Edema; No Ulcerations. Genitalia: not examined PULSES: 2+ and symmetric SKIN: Normal hydration no rash or ulceration CNS:  Alert and Oriented x 3, No Focal Deficits  Vascular: pulses palpable throughout    Labs on Admission:  Basic Metabolic Panel:  Recent Labs Lab 08/19/14 0035  NA 136  K 4.3  CL 101  CO2 30  GLUCOSE 150*  BUN 25*  CREATININE 1.18*  CALCIUM 8.9  MG 2.0   Liver Function Tests:  Recent Labs Lab  08/19/14 0035  AST 30  ALT 24  ALKPHOS 65  BILITOT 0.5  PROT 6.8  ALBUMIN 2.6*   No results for input(s): LIPASE, AMYLASE in the last 168 hours. No results for input(s): AMMONIA in the last 168 hours. CBC:  Recent Labs Lab 08/19/14 0035  WBC 13.7*  NEUTROABS 11.4*  HGB 10.2*  HCT 30.9*  MCV 86.6  PLT 340   Cardiac Enzymes:  Recent Labs Lab 08/19/14 0035  TROPONINI 0.03    BNP (last 3 results) No results for input(s): PROBNP in the last 8760 hours. CBG: No results for input(s): GLUCAP in the last 168 hours.  Radiological Exams on Admission: Ct Head Wo Contrast  08/19/2014   CLINICAL DATA:  Altered mental status.  Confusion.  EXAM: CT HEAD WITHOUT CONTRAST  TECHNIQUE: Contiguous axial images were obtained from the base of the  skull through the vertex without intravenous contrast.  COMPARISON:  None.  FINDINGS: Mild diffuse cerebral atrophy. Low-attenuation changes in the deep white matter consistent with small vessel ischemia. No mass effect or midline shift. No abnormal extra-axial fluid collections. Gray-white matter junctions are distinct. Basal cisterns are not effaced. No evidence of acute intracranial hemorrhage. No depressed skull fractures. Vascular calcifications. Mass versus inspissated mucus in the sphenoid sinuses. Visualized paranasal sinuses otherwise clear. Sclerosis versus congenital hypoaeration of the right mastoid air cells.  IMPRESSION: No acute intracranial abnormalities. Chronic atrophy and small vessel ischemic changes. Soft tissue in the sphenoid sinus may represent mass, polyp, or inspissated mucus.   Electronically Signed   By: Burman Nieves M.D.   On: 08/19/2014 01:06   Dg Chest Portable 1 View  08/19/2014   CLINICAL DATA:  Two weeks of generalized weakness and fatigue. Initial encounter.  EXAM: PORTABLE CHEST - 1 VIEW  COMPARISON:  Chest radiograph performed 04/21/2014, and CT of the chest performed 04/19/2014  FINDINGS: The lungs are well-aerated  and clear. There is no evidence of focal opacification, pleural effusion or pneumothorax.  The cardiomediastinal silhouette is borderline enlarged. No acute osseous abnormalities are seen.  IMPRESSION: Borderline cardiomegaly; lungs remain grossly clear.   Electronically Signed   By: Roanna Raider M.D.   On: 08/19/2014 01:09     Assessment/Plan:   81 y.o. female with  Principal Problem:   1.     Acute encephalopathy- due to #2   Monitor  Active Problems:   2.    UTI (urinary tract infection)   Urine C+S  Sent   IV Rocephin   Adjust ABx PRN Culture Result    3.    Hypercalcemia   Under Surveillance with Oncology, No Etiology as of Yet     4.    CKD (chronic kidney disease)   Change to Renal diet   Discontinue Supplements with Calcium     5.    RA (rheumatoid arthritis)       6.    Atrial fibrillation   On Xarelto Rx     7.    DVT Prophylaxis   On Xarelto Rx       Code Status:  FULL CODE     Family Communication:    Family at Bedside Disposition Plan:       Inpatient  Time spent:  69 Minutes  Ron Parker [facility] Pager [phone]   If 7AM -7PM Please Contact the Day Rounding Team MD for [facility]  If 7PM-7AM, Please Contact Night-Floor Coverage  [email or web address] Password [email or web address] 08/19/2014, 3:05 AM

## 2014-08-19 NOTE — ED Provider Notes (Signed)
CSN: [id number]     Arrival date & time 08/18/14  2357 History  This chart was scribed for [employer]. Rubin Payor, MD by Annye Asa, ED Scribe. This patient was seen in room B18C/B18C and the patient's care was started at 12:25 AM.    LEVEL 5; AMS  Chief Complaint  Patient presents with  . Fatigue  . Altered Mental Status   Patient is a 81 y.o. female presenting with altered mental status. The history is provided by a relative. No language interpreter was used.  Altered Mental Status Associated symptoms: weakness      HPI Comments: Zoe Robinson is a 81 y.o. female who presents to the Emergency Department complaining of two weeks of generalized weakness and fatigue. Daughter explains that patient does not eat or drink frequently, has had difficulty communicating clearly. She denies fevers. She explains that patient has had similar symptoms with previous UTIs.   She reports a fall 1 week PTA; patient does not recall this fall. She is currently on Xarelto for afib.   Daughter reports that patient was admitted for two weeks in September 2015 for severe UTI and hypercalcemia. Patient has improved since that time with regular care under Dr. Smith Robert ("one infusion and one iron shot"). The cause of the hypercalcemia is unknown at this time.  Past Medical History  Diagnosis Date  . Atrial fibrillation   . Arthritis   . Hypertension   . CKD (chronic kidney disease)   . RA (rheumatoid arthritis)    Past Surgical History  Procedure Laterality Date  . Abdominal hysterectomy     Family History  Problem Relation Age of Onset  . Heart attack Mother   . Heart attack Father   . Heart attack Brother   . Heart attack Brother   . Stomach cancer Brother    History  Substance Use Topics  . Smoking status: Former Smoker    Quit date: 04/15/1964  . Smokeless tobacco: Not on file  . Alcohol Use: No   OB History    No data available     Review of Systems  Constitutional: Positive for  fatigue.  Neurological: Positive for weakness.  All other systems reviewed and are negative.  Allergies  Review of patient's allergies indicates no known allergies.  Home Medications   Prior to Admission medications   Medication Sig Start Date End Date Taking? Authorizing Provider  acetaminophen (TYLENOL) 500 MG tablet Take 1,000 mg by mouth every 8 (eight) hours.   Yes Historical Provider, MD  feeding supplement, ENSURE COMPLETE, (ENSURE COMPLETE) LIQD Take 237 mLs by mouth 3 (three) times daily between meals. 04/26/14  Yes Joseph Art, DO  ferrous sulfate 325 (65 FE) MG tablet Take 325 mg by mouth daily with breakfast.   Yes Historical Provider, MD  folic acid (FOLVITE) 1 MG tablet Take 1 mg by mouth every morning.    Yes Historical Provider, MD  magnesium gluconate (MAGONATE) 500 MG tablet Take 500 mg by mouth every morning.    Yes Historical Provider, MD  Multiple Vitamins-Minerals (MULTIVITAMIN PO) Take 1 tablet by mouth every morning.    Yes Historical Provider, MD  oxybutynin (DITROPAN-XL) 10 MG 24 hr tablet Take 10 mg by mouth every morning.    Yes Historical Provider, MD  polyethylene glycol (MIRALAX / GLYCOLAX) packet Take 17 g by mouth daily as needed. Patient taking differently: Take 17 g by mouth daily as needed for mild constipation.  04/26/14  Yes Joseph Art, DO  predniSONE (DELTASONE) 5 MG tablet Take 5-10 mg by mouth every other day. Take 5 mg by mouth every other day alternating with 10 mg by mouth every other day.   Yes Historical Provider, MD  Rivaroxaban (XARELTO) 15 MG TABS tablet Take 15 mg by mouth every evening.    Yes Historical Provider, MD  simvastatin (ZOCOR) 40 MG tablet Take 20 mg by mouth every evening.   Yes Historical Provider, MD  valsartan (DIOVAN) 320 MG tablet Take 320 mg by mouth every morning.  03/29/14  Yes Historical Provider, MD  vitamin E 1000 UNIT capsule Take 1,000 Units by mouth every morning.   Yes Historical Provider, MD   BP 139/72 mmHg   Pulse 82  Temp(Src) 98.6 F (37 C) (Oral)  Resp 22  Ht 4\' 11"  (1.499 m)  Wt 112 lb 6.4 oz (50.984 kg)  BMI 22.69 kg/m2  SpO2 97% Physical Exam  Constitutional: She appears well-developed and well-nourished.  Alert, pleasant  HENT:  Head: Normocephalic and atraumatic.  Mouth/Throat: Oropharynx is clear and moist. No oropharyngeal exudate.  Eyes: EOM are normal. Pupils are equal, round, and reactive to light.  Cardiovascular: Normal rate, regular rhythm and normal heart sounds.  Exam reveals no gallop and no friction rub.   No murmur heard. Pulmonary/Chest: Effort normal and breath sounds normal. No respiratory distress. She has no wheezes. She has no rales.  Abdominal: Soft. There is no tenderness. There is no rebound and no guarding.  Musculoskeletal: Normal range of motion. She exhibits no edema.  No paracervical tenderness; no peripheral tenderness or edema  Neurological: She is alert.  Skin: Skin is warm and dry. No rash noted.  Healing scab over left periorbital area; no tenderness  Psychiatric: She has a normal mood and affect. Her behavior is normal.  Nursing note and vitals reviewed.   ED Course  Procedures   DIAGNOSTIC STUDIES: Oxygen Saturation is 95% on RA, adequate by my interpretation.    COORDINATION OF CARE: 12:29 AM Discussed treatment plan with pt at bedside and pt agreed to plan.   Labs Review Labs Reviewed  URINALYSIS, ROUTINE W REFLEX MICROSCOPIC - Abnormal; Notable for the following:    Hgb urine dipstick SMALL (*)    Nitrite POSITIVE (*)    Leukocytes, UA SMALL (*)    All other components within normal limits  COMPREHENSIVE METABOLIC PANEL - Abnormal; Notable for the following:    Glucose, Bld 150 (*)    BUN 25 (*)    Creatinine, Ser 1.18 (*)    Albumin 2.6 (*)    GFR calc non Af Amer 42 (*)    GFR calc Af Amer 49 (*)    All other components within normal limits  CBC WITH DIFFERENTIAL/PLATELET - Abnormal; Notable for the following:    WBC  13.7 (*)    RBC 3.57 (*)    Hemoglobin 10.2 (*)    HCT 30.9 (*)    Neutrophils Relative % 83 (*)    Neutro Abs 11.4 (*)    Lymphocytes Relative 11 (*)    All other components within normal limits  PROTIME-INR - Abnormal; Notable for the following:    Prothrombin Time 16.2 (*)    All other components within normal limits  URINE MICROSCOPIC-ADD ON - Abnormal; Notable for the following:    Bacteria, UA MANY (*)    All other components within normal limits  BASIC METABOLIC PANEL - Abnormal; Notable for the following:    Glucose, Bld 131 (*)  GFR calc non Af Amer 48 (*)    GFR calc Af Amer 56 (*)    All other components within normal limits  CBC - Abnormal; Notable for the following:    WBC 13.2 (*)    RBC 3.67 (*)    Hemoglobin 10.4 (*)    HCT 31.3 (*)    All other components within normal limits  URINE CULTURE  MAGNESIUM  TROPONIN I    Imaging Review Ct Head Wo Contrast  08/19/2014   CLINICAL DATA:  Altered mental status.  Confusion.  EXAM: CT HEAD WITHOUT CONTRAST  TECHNIQUE: Contiguous axial images were obtained from the base of the skull through the vertex without intravenous contrast.  COMPARISON:  None.  FINDINGS: Mild diffuse cerebral atrophy. Low-attenuation changes in the deep white matter consistent with small vessel ischemia. No mass effect or midline shift. No abnormal extra-axial fluid collections. Gray-white matter junctions are distinct. Basal cisterns are not effaced. No evidence of acute intracranial hemorrhage. No depressed skull fractures. Vascular calcifications. Mass versus inspissated mucus in the sphenoid sinuses. Visualized paranasal sinuses otherwise clear. Sclerosis versus congenital hypoaeration of the right mastoid air cells.  IMPRESSION: No acute intracranial abnormalities. Chronic atrophy and small vessel ischemic changes. Soft tissue in the sphenoid sinus may represent mass, polyp, or inspissated mucus.   Electronically Signed   By: Burman Nieves M.D.    On: 08/19/2014 01:06   Dg Chest Portable 1 View  08/19/2014   CLINICAL DATA:  Two weeks of generalized weakness and fatigue. Initial encounter.  EXAM: PORTABLE CHEST - 1 VIEW  COMPARISON:  Chest radiograph performed 04/21/2014, and CT of the chest performed 04/19/2014  FINDINGS: The lungs are well-aerated and clear. There is no evidence of focal opacification, pleural effusion or pneumothorax.  The cardiomediastinal silhouette is borderline enlarged. No acute osseous abnormalities are seen.  IMPRESSION: Borderline cardiomegaly; lungs remain grossly clear.   Electronically Signed   By: Roanna Raider M.D.   On: 08/19/2014 01:09     EKG Interpretation   Date/Time:  Monday August 19 2014 00:30:28 EST Ventricular Rate:  71 PR Interval:  132 QRS Duration: 87 QT Interval:  382 QTC Calculation: 415 R Axis:   74 Text Interpretation:  Sinus rhythm Premature atrial complexes Confirmed by  Rubin Payor  MD, Harrold Donath [phone]) on 08/19/2014 3:26:27 AM      MDM   Final diagnoses:  Altered mental status  Lower urinary tract infection, acute   Patient with altered mental status. hAs apparent urinary tract infection. She comes from home. Will admit to internal medicine.  I personally performed the services described in this documentation, which was scribed in my presence. The recorded information has been reviewed and is accurate.      Juliet Rude. Rubin Payor, MD 08/19/14 [phone]

## 2014-08-19 NOTE — ED Notes (Signed)
Pt.'s family  is concerned that pt. has a UTI reports malodorous urine , fatigue/lethargy and confusion for the past several days , symptoms similar when she had a UTI in the past , denies fever or chills.

## 2014-08-19 NOTE — Progress Notes (Addendum)
Nurse notified via text msg MD, Dr. Randol Kern of pt's manual BP 90/48, asymptomatic. Will monitor for MD's return call/orders   Andrew Au I 08/19/2014 2:11 PM

## 2014-08-19 NOTE — Progress Notes (Signed)
Patient Demographics  Zoe Robinson, is a 81 y.o. female, DOB - Aug 18, 1931, [id number]  Admit date - 08/19/2014   Admitting Physician Ron Parker, MD  Outpatient Primary MD for the patient is [facility], MD  LOS - 0   Chief Complaint  Patient presents with  . Fatigue  . Altered Mental Status      Admission history of present illness/brief narrative: 81 y.o. female with a history of Paroxsymal Atrial Fibrillation, hypercalcemia, Rheumatoid Arthritis, CKD Who presents to the ED with worsening confusion over the past 2 weeks, denies weakness, decreased appetite. Patient denies a fevers and chills.But patient spiked a fever of 103.3, was found to have positive urinalysis, and started on IV Rocephin for UTI , x-ray did not show any opacity or infiltrate . Subjective:   [name] today has, No headache, No chest pain, No abdominal pain - No Nausea, No new weakness tingling or numbness, No Cough , patient had fever of 103.3.  Assessment & Plan    Principal Problem:   Acute encephalopathy Active Problems:   Hypercalcemia   CKD (chronic kidney disease)   RA (rheumatoid arthritis)   Atrial fibrillation   UTI (urinary tract infection)  Acute encephalopathy  - This is secondary to metabolic encephalopathy secondary to sepsis . - acute intracranial finding .  Sepsis UTI -  fever  103.3, leukocytosis. - Secondary to UTI, follow on Septic workup, negative chest x-ray . - Follow on urine culture  - Continue with IV Rocephin   Hypercalcemia  - Been followed by oncology as an outpatient, no etiology as of yet  - Within normal limits   History of atrial fibrillation - Rate controlled, on Xarelto  Hypertension -Continue with home medication  Rheumatoid arthritis -Continue with prednisone   Code Status: Full  Family Communication:  Daughter at bedside  Disposition Plan: Remains inpatient   Procedures  None   Consults   None    Medications  Scheduled Meds: . [START ON 08/20/2014] cefTRIAXone (ROCEPHIN)  IV  1 g Intravenous Q24H  . ferrous sulfate  325 mg Oral Q breakfast  . folic acid  1 mg Oral Daily  . irbesartan  300 mg Oral Daily  . magnesium gluconate  500 mg Oral Daily  . oxybutynin  10 mg Oral Daily  . predniSONE  10 mg Oral QODAY  . [START ON 08/20/2014] predniSONE  5 mg Oral QODAY  . Rivaroxaban  15 mg Oral Q supper  . simvastatin  20 mg Oral QPM  . sodium chloride  3 mL Intravenous Q12H  . vitamin E  1,000 Units Oral Daily   Continuous Infusions:  PRN Meds:.sodium chloride, acetaminophen **OR** acetaminophen, HYDROmorphone (DILAUDID) injection, ondansetron **OR** ondansetron (ZOFRAN) IV, oxyCODONE, polyethylene glycol, sodium chloride  DVT Prophylaxis on Xarelto  Lab Results  Component Value Date   PLT 334 08/19/2014    Antibiotics    Anti-infectives    Start     Dose/Rate Route Frequency Ordered Stop   08/20/14 0200  cefTRIAXone (ROCEPHIN) 1 g in dextrose 5 % 50 mL IVPB     1 g100 mL/hr over 30 Minutes Intravenous Every 24 hours 08/19/14 0304     08/19/14 0315  cefTRIAXone (ROCEPHIN) 1 g in dextrose 5 %  50 mL IVPB  Status:  Discontinued     1 g100 mL/hr over 30 Minutes Intravenous Every 24 hours 08/19/14 0304 08/19/14 0304   08/19/14 0215  cefTRIAXone (ROCEPHIN) 1 g in dextrose 5 % 50 mL IVPB     1 g100 mL/hr over 30 Minutes Intravenous  Once 08/19/14 0208 08/19/14 0242          Objective:   Filed Vitals:   08/19/14 0343 08/19/14 0417 08/19/14 0843 08/19/14 1001  BP:  139/72  95/44  Pulse:  82  90  Temp: 98.9 F (37.2 C) 98.6 F (37 C) 103.3 F (39.6 C) 100.9 F (38.3 C)  TempSrc: Oral Oral  Oral  Resp:  22  20  Height:  4\' 11"  (1.499 m)    Weight:  50.984 kg (112 lb 6.4 oz)    SpO2:  97%  95%    Wt Readings from Last 3 Encounters:  08/19/14 50.984 kg (112 lb 6.4  oz)  06/04/14 46.222 kg (101 lb 14.4 oz)  05/07/14 44.044 kg (97 lb 1.6 oz)     Intake/Output Summary (Last 24 hours) at 08/19/14 1308 Last data filed at 08/19/14 0900  Gross per 24 hour  Intake    120 ml  Output      0 ml  Net    120 ml     Physical Exam  Awake Alert, Oriented X 3, frail No new F.N deficits, Normal affect [place].AT,PERRAL Supple Neck,No JVD, No cervical lymphadenopathy appriciated.  Symmetrical Chest wall movement, Good air movement bilaterally, CTAB RRR,No Gallops,Rubs or new Murmurs, No Parasternal Heave +ve B.Sounds, Abd Soft, No tenderness, No organomegaly appriciated, No rebound - guarding or rigidity. No Cyanosis, Clubbing or edema, No new Rash or bruise     Data Review   Micro Results No results found for this or any previous visit (from the past 240 hour(s)).  Radiology Reports Ct Head Wo Contrast  08/19/2014   CLINICAL DATA:  Altered mental status.  Confusion.  EXAM: CT HEAD WITHOUT CONTRAST  TECHNIQUE: Contiguous axial images were obtained from the base of the skull through the vertex without intravenous contrast.  COMPARISON:  None.  FINDINGS: Mild diffuse cerebral atrophy. Low-attenuation changes in the deep white matter consistent with small vessel ischemia. No mass effect or midline shift. No abnormal extra-axial fluid collections. Gray-white matter junctions are distinct. Basal cisterns are not effaced. No evidence of acute intracranial hemorrhage. No depressed skull fractures. Vascular calcifications. Mass versus inspissated mucus in the sphenoid sinuses. Visualized paranasal sinuses otherwise clear. Sclerosis versus congenital hypoaeration of the right mastoid air cells.  IMPRESSION: No acute intracranial abnormalities. Chronic atrophy and small vessel ischemic changes. Soft tissue in the sphenoid sinus may represent mass, polyp, or inspissated mucus.   Electronically Signed   By: 08/21/2014 M.D.   On: 08/19/2014 01:06   Dg Chest Portable 1  View  08/19/2014   CLINICAL DATA:  Two weeks of generalized weakness and fatigue. Initial encounter.  EXAM: PORTABLE CHEST - 1 VIEW  COMPARISON:  Chest radiograph performed 04/21/2014, and CT of the chest performed 04/19/2014  FINDINGS: The lungs are well-aerated and clear. There is no evidence of focal opacification, pleural effusion or pneumothorax.  The cardiomediastinal silhouette is borderline enlarged. No acute osseous abnormalities are seen.  IMPRESSION: Borderline cardiomegaly; lungs remain grossly clear.   Electronically Signed   By: 04/21/2014 M.D.   On: 08/19/2014 01:09    CBC  Recent Labs Lab 08/19/14 0035 08/19/14  0455  WBC 13.7* 13.2*  HGB 10.2* 10.4*  HCT 30.9* 31.3*  PLT 340 334  MCV 86.6 85.3  MCH 28.6 28.3  MCHC 33.0 33.2  RDW 13.2 13.4  LYMPHSABS 1.5  --   MONOABS 0.8  --   EOSABS 0.0  --   BASOSABS 0.0  --     Chemistries   Recent Labs Lab 08/19/14 0035 08/19/14 0455  NA 136 140  K 4.3 4.1  CL 101 100  CO2 30 32  GLUCOSE 150* 131*  BUN 25* 22  CREATININE 1.18* 1.06  CALCIUM 8.9 9.0  MG 2.0  --   AST 30  --   ALT 24  --   ALKPHOS 65  --   BILITOT 0.5  --    ------------------------------------------------------------------------------------------------------------------ estimated creatinine clearance is 28.9 mL/min (by C-G formula based on Cr of 1.06). ------------------------------------------------------------------------------------------------------------------ No results for input(s): HGBA1C in the last 72 hours. ------------------------------------------------------------------------------------------------------------------ No results for input(s): CHOL, HDL, LDLCALC, TRIG, CHOLHDL, LDLDIRECT in the last 72 hours. ------------------------------------------------------------------------------------------------------------------ No results for input(s): TSH, T4TOTAL, T3FREE, THYROIDAB in the last 72 hours.  Invalid input(s):  FREET3 ------------------------------------------------------------------------------------------------------------------ No results for input(s): VITAMINB12, FOLATE, FERRITIN, TIBC, IRON, RETICCTPCT in the last 72 hours.  Coagulation profile  Recent Labs Lab 08/19/14 0035  INR 1.29    No results for input(s): DDIMER in the last 72 hours.  Cardiac Enzymes  Recent Labs Lab 08/19/14 0035  TROPONINI 0.03   ------------------------------------------------------------------------------------------------------------------ Invalid input(s): POCBNP     Time Spent in minutes   30 minutes   Koda Defrank M.D on 08/19/2014 at 1:08 PM  Between 7am to 7pm - Pager - [phone]  After 7pm go to [email or web address] - password [email or web address]  And look for the night coverage person covering for me after hours  [facility] Office  [phone]   **Disclaimer: This note may have been dictated with voice recognition software. Similar sounding words can inadvertently be transcribed and this note may contain transcription errors which may not have been corrected upon publication of note.**

## 2014-08-19 NOTE — ED Notes (Signed)
Admitting physician at the bedside at this time. 

## 2014-08-19 NOTE — Progress Notes (Signed)
UR complete.  Renesme Kerrigan RN, MSN 

## 2014-08-19 NOTE — Progress Notes (Signed)
Pt arrived on unit 0400 hrs, A&O, no obvious distress, no c/o pain, oriented to unit and room equipment, admission orders implemented.

## 2014-08-19 NOTE — ED Notes (Signed)
Attempted report X 1.  Nurse to call back. 

## 2014-08-20 LAB — BASIC METABOLIC PANEL
Anion gap: 9 (ref 5–15)
BUN: 24 mg/dL — AB (ref 6–23)
CO2: 24 mmol/L (ref 19–32)
Calcium: 8.2 mg/dL — ABNORMAL LOW (ref 8.4–10.5)
Chloride: 107 mmol/L (ref 96–112)
Creatinine, Ser: 1.02 mg/dL (ref 0.50–1.10)
GFR calc Af Amer: 59 mL/min — ABNORMAL LOW (ref >90)
GFR calc non Af Amer: 51 mL/min — ABNORMAL LOW (ref >90)
GLUCOSE: 130 mg/dL — AB (ref 70–99)
Potassium: 3.7 mmol/L (ref 3.5–5.1)
Sodium: 140 mmol/L (ref 135–145)

## 2014-08-20 LAB — CBC
HCT: 30.3 % — ABNORMAL LOW (ref 36.0–46.0)
Hemoglobin: 9.8 g/dL — ABNORMAL LOW (ref 12.0–15.0)
MCH: 27.8 pg (ref 26.0–34.0)
MCHC: 32.3 g/dL (ref 30.0–36.0)
MCV: 85.8 fL (ref 78.0–100.0)
Platelets: 375 10*3/uL (ref 150–400)
RBC: 3.53 MIL/uL — ABNORMAL LOW (ref 3.87–5.11)
RDW: 13.5 % (ref 11.5–15.5)
WBC: 11.1 10*3/uL — ABNORMAL HIGH (ref 4.0–10.5)

## 2014-08-20 NOTE — Progress Notes (Signed)
Pt ambulates x1 assist to the bathroom, gait steady standby. No noted distress. She denies pain or discomfort. Will continue to monitor.

## 2014-08-20 NOTE — Evaluation (Signed)
Physical Therapy Evaluation Patient Details Name: Zoe Robinson MRN: [id number] DOB: 10-Apr-1932 Today's Date: 08/20/2014   History of Present Illness  Adm 08/19/14 with AMS; +UTI PMHx-CKD, RA, afib  Clinical Impression  Patient evaluated by Physical Therapy with no further acute PT needs identified. All education has been completed and the patient has no further questions.  See below for any follow-up Physial Therapy or equipment needs. PT is signing off. Thank you for this referral.     Follow Up Recommendations No PT follow up    Equipment Recommendations  None recommended by PT    Recommendations for Other Services       Precautions / Restrictions Precautions Precautions: None      Mobility  Bed Mobility Overal bed mobility: Independent                Transfers Overall transfer level: Independent                  Ambulation/Gait Ambulation/Gait assistance: Independent Ambulation Distance (Feet): 30 Feet Assistive device: None Gait Pattern/deviations: WFL(Within Functional Limits)        Stairs            Wheelchair Mobility    Modified Rankin (Stroke Patients Only)       Balance Overall balance assessment: Independent                               Standardized Balance Assessment Standardized Balance Assessment : Berg Balance Test Berg Balance Test Sit to Stand: Able to stand without using hands and stabilize independently Standing Unsupported: Able to stand safely 2 minutes Sitting with Back Unsupported but Feet Supported on Floor or Stool: Able to sit safely and securely 2 minutes Stand to Sit: Sits safely with minimal use of hands Transfers: Able to transfer safely, minor use of hands Standing Unsupported with Eyes Closed: Able to stand 10 seconds safely Standing Ubsupported with Feet Together: Able to place feet together independently and stand for 1 minute with supervision From Standing, Reach Forward with  Outstretched Arm: Can reach forward >12 cm safely (5") Turn 360 Degrees: Able to turn 360 degrees safely in 4 seconds or less Standing Unsupported, One Foot in Front: Able to plae foot ahead of the other independently and hold 30 seconds         Pertinent Vitals/Pain Pain Assessment: No/denies pain    Home Living Family/patient expects to be discharged to:: Private residence Living Arrangements: Spouse/significant other Available Help at Discharge: Family;Available PRN/intermittently Type of Home: House Home Access: Ramped entrance     Home Layout: Two level;Able to live on main level with bedroom/bathroom Home Equipment: Dan Humphreys - 2 wheels;Cane - single point      Prior Function Level of Independence: Independent               Hand Dominance   Dominant Hand: Right    Extremity/Trunk Assessment   Upper Extremity Assessment: Overall WFL for tasks assessed           Lower Extremity Assessment: Overall WFL for tasks assessed      Cervical / Trunk Assessment: Normal  Communication   Communication: No difficulties  Cognition Arousal/Alertness: Awake/alert Behavior During Therapy: WFL for tasks assessed/performed Overall Cognitive Status: Within Functional Limits for tasks assessed                      General Comments  Exercises        Assessment/Plan    PT Assessment Patent does not need any further PT services  PT Diagnosis Altered mental status   PT Problem List    PT Treatment Interventions     PT Goals (Current goals can be found in the Care Plan section) Acute Rehab PT Goals PT Goal Formulation: All assessment and education complete, DC therapy    Frequency     Barriers to discharge        Co-evaluation               End of Session Equipment Utilized During Treatment: Gait belt Activity Tolerance: Patient tolerated treatment well Patient left: in chair;with call bell/phone within reach;with chair alarm set Nurse  Communication: Mobility status (no PT needs)         Time: 1550-1603 PT Time Calculation (min) (ACUTE ONLY): 13 min   Charges:   PT Evaluation $Initial PT Evaluation Tier I: 1 Procedure     PT G Codes:        Zoe Robinson 2014-08-30, 4:09 PM  Pager [phone]

## 2014-08-20 NOTE — Progress Notes (Addendum)
Patient Demographics  Zoe Robinson, is a 81 y.o. female, DOB - 02-15-1932, YWV:[id number]  Admit date - 08/19/2014   Admitting Physician Ron Parker, MD  Outpatient Primary MD for the patient is [facility], MD  LOS - 1   Chief Complaint  Patient presents with  . Fatigue  . Altered Mental Status      Admission history of present illness/brief narrative: 81 y.o. female with a history of Paroxsymal Atrial Fibrillation, hypercalcemia, Rheumatoid Arthritis, CKD Who presents to the ED with worsening confusion over the past 2 weeks, denies weakness, decreased appetite. Patient denies a fevers and chills.But patient spiked a fever of 103.3, was found to have positive urinalysis, and started on IV Rocephin for UTI , x-ray did not show any opacity or infiltrate . Subjective:   [name] today has, No headache, No chest pain, No abdominal pain - No Nausea, No new weakness tingling or numbness, No Cough , patient is afebrile.  Assessment & Plan    Principal Problem:   Acute encephalopathy Active Problems:   Hypercalcemia   CKD (chronic kidney disease)   RA (rheumatoid arthritis)   Atrial fibrillation   UTI (urinary tract infection)  Acute encephalopathy  - This is secondary to metabolic encephalopathy secondary to sepsis . - CT head showing no acute intracranial finding . - Resolved, back to baseline  Sepsis/ UTI -  fever  103.3, leukocytosis. - Secondary to UTI, follow on Septic workup, negative chest x-ray . - Urine culture growing Escherichia coli, sensitivities pending - Follow on blood culture - Continue with IV Rocephin 1/ 25 - Patient had an episode of low blood pressure 1/25 , she was given one dose of IV hydrocortisone, given she is a steroid dependent.  Hypercalcemia  - Been followed by oncology as an outpatient, no etiology  as of yet  - Within normal limits   History of atrial fibrillation - Rate controlled, on Xarelto  Hypertension -Acceptable off medication  Rheumatoid arthritis -Continue with prednisone   Code Status: Full  Family Communication: None at bedside  Disposition Plan: If continues to improve, will be discharged  in a.m. awaiting PT consult.   Procedures  None   Consults   None    Medications  Scheduled Meds: . cefTRIAXone (ROCEPHIN)  IV  1 g Intravenous Q24H  . ferrous sulfate  325 mg Oral Q breakfast  . folic acid  1 mg Oral Daily  . magnesium gluconate  500 mg Oral Daily  . oxybutynin  10 mg Oral Daily  . pantoprazole  40 mg Oral Daily  . predniSONE  10 mg Oral QODAY  . predniSONE  5 mg Oral QODAY  . Rivaroxaban  15 mg Oral Q supper  . simvastatin  20 mg Oral QPM  . sodium chloride  3 mL Intravenous Q12H  . vitamin E  1,000 Units Oral Daily   Continuous Infusions: . sodium chloride 75 mL/hr (08/19/14 2310)   PRN Meds:.acetaminophen **OR** acetaminophen, HYDROmorphone (DILAUDID) injection, ondansetron **OR** ondansetron (ZOFRAN) IV, oxyCODONE, polyethylene glycol, sodium chloride  DVT Prophylaxis on Xarelto  Lab Results  Component Value Date   PLT 375 08/20/2014    Antibiotics    Anti-infectives    Start     Dose/Rate  Route Frequency Ordered Stop   08/20/14 0200  cefTRIAXone (ROCEPHIN) 1 g in dextrose 5 % 50 mL IVPB     1 g100 mL/hr over 30 Minutes Intravenous Every 24 hours 08/19/14 0304     08/19/14 0315  cefTRIAXone (ROCEPHIN) 1 g in dextrose 5 % 50 mL IVPB  Status:  Discontinued     1 g100 mL/hr over 30 Minutes Intravenous Every 24 hours 08/19/14 0304 08/19/14 0304   08/19/14 0215  cefTRIAXone (ROCEPHIN) 1 g in dextrose 5 % 50 mL IVPB     1 g100 mL/hr over 30 Minutes Intravenous  Once 08/19/14 0208 08/19/14 0242          Objective:   Filed Vitals:   08/19/14 2227 08/20/14 0131 08/20/14 0521 08/20/14 0956  BP: 133/72 127/42 131/47 128/50    Pulse: 93 68 64 74  Temp: 97.8 F (36.6 C) 97.7 F (36.5 C) 98 F (36.7 C) 98.8 F (37.1 C)  TempSrc: Oral Oral Oral Oral  Resp: 18 18 16 20   Height:      Weight:      SpO2: 98% 97% 98% 97%    Wt Readings from Last 3 Encounters:  08/19/14 50.984 kg (112 lb 6.4 oz)  06/04/14 46.222 kg (101 lb 14.4 oz)  05/07/14 44.044 kg (97 lb 1.6 oz)     Intake/Output Summary (Last 24 hours) at 08/20/14 1113 Last data filed at 08/19/14 1719  Gross per 24 hour  Intake    600 ml  Output      0 ml  Net    600 ml     Physical Exam  Awake Alert, Oriented X 3, frail No new F.N deficits, Normal affect [place].AT,PERRAL Supple Neck,No JVD, No cervical lymphadenopathy appriciated.  Symmetrical Chest wall movement, Good air movement bilaterally, CTAB RRR,No Gallops,Rubs or new Murmurs, No Parasternal Heave +ve B.Sounds, Abd Soft, No tenderness, No organomegaly appriciated, No rebound - guarding or rigidity. No Cyanosis, Clubbing or edema, No new Rash or bruise     Data Review   Micro Results Recent Results (from the past 240 hour(s))  Urine culture     Status: None (Preliminary result)   Collection Time: 08/19/14 12:43 AM  Result Value Ref Range Status   Specimen Description URINE, RANDOM  Final   Special Requests ADDED 0214  Final   Colony Count   Final    >=100,000 COLONIES/ML Performed at [facility]    Culture   Final    ESCHERICHIA COLI Performed at [facility]    Report Status PENDING  Incomplete  Culture, blood (routine x 2)     Status: None (Preliminary result)   Collection Time: 08/19/14  9:36 AM  Result Value Ref Range Status   Specimen Description BLOOD LEFT FOREARM  Final   Special Requests BOTTLES DRAWN AEROBIC ONLY 9CC  Final   Culture   Final           BLOOD CULTURE RECEIVED NO GROWTH TO DATE CULTURE WILL BE HELD FOR 5 DAYS BEFORE ISSUING A FINAL NEGATIVE REPORT Performed at [facility]    Report Status PENDING  Incomplete  Culture,  blood (routine x 2)     Status: None (Preliminary result)   Collection Time: 08/19/14  9:42 AM  Result Value Ref Range Status   Specimen Description BLOOD LEFT ANTECUBITAL  Final   Special Requests BOTTLES DRAWN AEROBIC ONLY 8CC  Final   Culture   Final  BLOOD CULTURE RECEIVED NO GROWTH TO DATE CULTURE WILL BE HELD FOR 5 DAYS BEFORE ISSUING A FINAL NEGATIVE REPORT Performed at [facility]    Report Status PENDING  Incomplete    Radiology Reports Ct Head Wo Contrast  08/19/2014   CLINICAL DATA:  Altered mental status.  Confusion.  EXAM: CT HEAD WITHOUT CONTRAST  TECHNIQUE: Contiguous axial images were obtained from the base of the skull through the vertex without intravenous contrast.  COMPARISON:  None.  FINDINGS: Mild diffuse cerebral atrophy. Low-attenuation changes in the deep white matter consistent with small vessel ischemia. No mass effect or midline shift. No abnormal extra-axial fluid collections. Gray-white matter junctions are distinct. Basal cisterns are not effaced. No evidence of acute intracranial hemorrhage. No depressed skull fractures. Vascular calcifications. Mass versus inspissated mucus in the sphenoid sinuses. Visualized paranasal sinuses otherwise clear. Sclerosis versus congenital hypoaeration of the right mastoid air cells.  IMPRESSION: No acute intracranial abnormalities. Chronic atrophy and small vessel ischemic changes. Soft tissue in the sphenoid sinus may represent mass, polyp, or inspissated mucus.   Electronically Signed   By: Burman Nieves M.D.   On: 08/19/2014 01:06   Dg Chest Portable 1 View  08/19/2014   CLINICAL DATA:  Two weeks of generalized weakness and fatigue. Initial encounter.  EXAM: PORTABLE CHEST - 1 VIEW  COMPARISON:  Chest radiograph performed 04/21/2014, and CT of the chest performed 04/19/2014  FINDINGS: The lungs are well-aerated and clear. There is no evidence of focal opacification, pleural effusion or pneumothorax.  The  cardiomediastinal silhouette is borderline enlarged. No acute osseous abnormalities are seen.  IMPRESSION: Borderline cardiomegaly; lungs remain grossly clear.   Electronically Signed   By: Roanna Raider M.D.   On: 08/19/2014 01:09    CBC  Recent Labs Lab 08/19/14 0035 08/19/14 0455 08/20/14 0545  WBC 13.7* 13.2* 11.1*  HGB 10.2* 10.4* 9.8*  HCT 30.9* 31.3* 30.3*  PLT 340 334 375  MCV 86.6 85.3 85.8  MCH 28.6 28.3 27.8  MCHC 33.0 33.2 32.3  RDW 13.2 13.4 13.5  LYMPHSABS 1.5  --   --   MONOABS 0.8  --   --   EOSABS 0.0  --   --   BASOSABS 0.0  --   --     Chemistries   Recent Labs Lab 08/19/14 0035 08/19/14 0455 08/20/14 0545  NA 136 140 140  K 4.3 4.1 3.7  CL 101 100 107  CO2 30 32 24  GLUCOSE 150* 131* 130*  BUN 25* 22 24*  CREATININE 1.18* 1.06 1.02  CALCIUM 8.9 9.0 8.2*  MG 2.0  --   --   AST 30  --   --   ALT 24  --   --   ALKPHOS 65  --   --   BILITOT 0.5  --   --    ------------------------------------------------------------------------------------------------------------------ estimated creatinine clearance is 30 mL/min (by C-G formula based on Cr of 1.02). ------------------------------------------------------------------------------------------------------------------ No results for input(s): HGBA1C in the last 72 hours. ------------------------------------------------------------------------------------------------------------------ No results for input(s): CHOL, HDL, LDLCALC, TRIG, CHOLHDL, LDLDIRECT in the last 72 hours. ------------------------------------------------------------------------------------------------------------------ No results for input(s): TSH, T4TOTAL, T3FREE, THYROIDAB in the last 72 hours.  Invalid input(s): FREET3 ------------------------------------------------------------------------------------------------------------------ No results for input(s): VITAMINB12, FOLATE, FERRITIN, TIBC, IRON, RETICCTPCT in the last 72  hours.  Coagulation profile  Recent Labs Lab 08/19/14 0035  INR 1.29    No results for input(s): DDIMER in the last 72 hours.  Cardiac Enzymes  Recent Labs  Lab 08/19/14 0035  TROPONINI 0.03   ------------------------------------------------------------------------------------------------------------------ Invalid input(s): POCBNP     Time Spent in minutes   30 minutes   Montrel Donahoe M.D on 08/20/2014 at 11:13 AM  Between 7am to 7pm - Pager - [phone]  After 7pm go to [email or web address] - password [email or web address]  And look for the night coverage person covering for me after hours  [facility] Office  [phone]   **Disclaimer: This note may have been dictated with voice recognition software. Similar sounding words can inadvertently be transcribed and this note may contain transcription errors which may not have been corrected upon publication of note.**

## 2014-08-21 DIAGNOSIS — N3000 Acute cystitis without hematuria: Secondary | ICD-10-CM

## 2014-08-21 DIAGNOSIS — N183 Chronic kidney disease, stage 3 (moderate): Secondary | ICD-10-CM

## 2014-08-21 LAB — URINE CULTURE: Colony Count: >=100000

## 2014-08-21 MED ORDER — CIPROFLOXACIN HCL 500 MG PO TABS: 500.0000 mg | ORAL_TABLET | Freq: Two times a day (BID) | ORAL | Status: DC

## 2014-08-21 NOTE — Discharge Instructions (Signed)
Follow with [facility], MD in 5-7 days  Please get a complete blood count and chemistry panel checked by your Primary MD at your next visit, and again as instructed by your Primary MD. Please get your medications reviewed and adjusted by your Primary MD.  Please request your Primary MD to go over all Hospital Tests and Procedure/Radiological results at the follow up, please get all Hospital records sent to your Prim MD by signing hospital release before you go home.  If you had Pneumonia of Lung problems at the Hospital: Please get a 2 view Chest X ray done in 6-8 weeks after hospital discharge or sooner if instructed by your Primary MD.  If you have Congestive Heart Failure: Please call your Cardiologist or Primary MD anytime you have any of the following symptoms:  1) 3 pound weight gain in 24 hours or 5 pounds in 1 week  2) shortness of breath, with or without a dry hacking cough  3) swelling in the hands, feet or stomach  4) if you have to sleep on extra pillows at night in order to breathe  Follow cardiac low salt diet and 1.5 lit/day fluid restriction.  If you have diabetes Accuchecks 4 times/day, Once in AM empty stomach and then before each meal. Log in all results and show them to your primary doctor at your next visit. If any glucose reading is under 80 or above 300 call your primary MD immediately.  If you have Seizure/Convulsions/Epilepsy: Please do not drive, operate heavy machinery, participate in activities at heights or participate in high speed sports until you have seen by Primary MD or a Neurologist and advised to do so again.  If you had Gastrointestinal Bleeding: Please ask your Primary MD to check a complete blood count within one week of discharge or at your next visit. Your endoscopic/colonoscopic biopsies that are pending at the time of discharge, will also need to followed by your Primary MD.  Get Medicines reviewed and adjusted. Please take all your  medications with you for your next visit with your Primary MD  Please request your Primary MD to go over all hospital tests and procedure/radiological results at the follow up, please ask your Primary MD to get all Hospital records sent to his/her office.  If you experience worsening of your admission symptoms, develop shortness of breath, life threatening emergency, suicidal or homicidal thoughts you must seek medical attention immediately by calling 911 or calling your MD immediately  if symptoms less severe.  You must read complete instructions/literature along with all the possible adverse reactions/side effects for all the Medicines you take and that have been prescribed to you. Take any new Medicines after you have completely understood and accpet all the possible adverse reactions/side effects.   Do not drive or operate heavy machinery when taking Pain medications.   Do not take more than prescribed Pain, Sleep and Anxiety Medications  Special Instructions: If you have smoked or chewed Tobacco  in the last 2 yrs please stop smoking, stop any regular Alcohol  and or any Recreational drug use.  Wear Seat belts while driving.  Please note You were cared for by a hospitalist during your hospital stay. If you have any questions about your discharge medications or the care you received while you were in the hospital after you are discharged, you can call the unit and asked to speak with the hospitalist on call if the hospitalist that took care of you is not available. Once you  are discharged, your primary care physician will handle any further medical issues. Please note that NO REFILLS for any discharge medications will be authorized once you are discharged, as it is imperative that you return to your primary care physician (or establish a relationship with a primary care physician if you do not have one) for your aftercare needs so that they can reassess your need for medications and monitor your  lab values.  You can reach the hospitalist office at phone [phone] or fax [phone]   If you do not have a primary care physician, you can call [phone] for a physician referral.  Activity: As tolerated with Full fall precautions use walker/cane & assistance as needed  Diet: regular  Disposition Home

## 2014-08-21 NOTE — Discharge Summary (Signed)
Physician Discharge Summary  Zoe Robinson OHY:[id number] DOB: [birth year] DOA: 08/19/2014  PCP: Lindwood Qua, MD  Admit date: 08/19/2014 Discharge date: 08/21/2014  Time spent: 45 minutes  Recommendations for Outpatient Follow-up:  1. Follow up with Dr. Mikey Bussing in 1 week 2. Follow up with Oncology as previously scheduled   Discharge Diagnoses:  Principal Problem:   Acute encephalopathy Active Problems:   Hypercalcemia   CKD (chronic kidney disease)   RA (rheumatoid arthritis)   Atrial fibrillation   UTI (urinary tract infection)  Discharge Condition: stable  Diet recommendation: heart healthy  Filed Weights   08/19/14 0002 08/19/14 0417  Weight: 48.535 kg (107 lb) 50.984 kg (112 lb 6.4 oz)   History of present illness:  Zoe Robinson is a 81 y.o. female with a history of Paroxsymal Atrial Fibrillation, hypercalcemia, Rheumatoid Arthritis, CKD Who presents to the ED with worsening confusion over the past 2 weeks. Patient denies a fevers and chills. She was found to have a +UA and was placed on IV Rocephin and referred for admission.   Hospital Course:  Acute encephalopathy - This is secondary to metabolic encephalopathy secondary to sepsis, her sepsis physiology resolved with antibiotics. CT head showing no acute intracranial finding. Resolved, back to baseline on discharge. Sepsis/ UTI -fever 103.3, leukocytosis, secondary to UTI. Urine cultures grew E coli sensitive to Ciprofloxacin and she will be discharged home with 5 additional days of antibiotics. Hypercalcemia - Been followed by oncology as an outpatient, no etiology as of yet  History of atrial fibrillation - Rate controlled, on Xarelto Hypertension  Rheumatoid arthritis - Continue with prednisone CKD III - Cr at baseline  Procedures:  None    Consultations:  None   Discharge Exam: Filed Vitals:   08/20/14 2202 08/21/14 0246 08/21/14 0601 08/21/14 1016  BP: 137/49 143/52 145/54 121/47    Pulse: 63 68 67 70  Temp: 97.8 F (36.6 C) 98 F (36.7 C) 98.2 F (36.8 C) 98.6 F (37 C)  TempSrc: Oral Oral Oral Oral  Resp: 18 18 16 20   Height:      Weight:      SpO2: 96% 98% 97% 98%   General: NAD Cardiovascular: RRR Respiratory: CTA biL  Discharge Instructions     Medication List    TAKE these medications        acetaminophen 500 MG tablet  Commonly known as:  TYLENOL  Take 1,000 mg by mouth every 8 (eight) hours.     ciprofloxacin 500 MG tablet  Commonly known as:  CIPRO  Take 1 tablet (500 mg total) by mouth 2 (two) times daily.     feeding supplement (ENSURE COMPLETE) Liqd  Take 237 mLs by mouth 3 (three) times daily between meals.     ferrous sulfate 325 (65 FE) MG tablet  Take 325 mg by mouth daily with breakfast.     folic acid 1 MG tablet  Commonly known as:  FOLVITE  Take 1 mg by mouth every morning.     magnesium gluconate 500 MG tablet  Commonly known as:  MAGONATE  Take 500 mg by mouth every morning.     MULTIVITAMIN PO  Take 1 tablet by mouth every morning.     oxybutynin 10 MG 24 hr tablet  Commonly known as:  DITROPAN-XL  Take 10 mg by mouth every morning.     polyethylene glycol packet  Commonly known as:  MIRALAX / GLYCOLAX  Take 17 g by mouth daily as needed.  predniSONE 5 MG tablet  Commonly known as:  DELTASONE  Take 5-10 mg by mouth every other day. Take 5 mg by mouth every other day alternating with 10 mg by mouth every other day.     Rivaroxaban 15 MG Tabs tablet  Commonly known as:  XARELTO  Take 15 mg by mouth every evening.     simvastatin 40 MG tablet  Commonly known as:  ZOCOR  Take 20 mg by mouth every evening.     valsartan 320 MG tablet  Commonly known as:  DIOVAN  Take 320 mg by mouth every morning.     vitamin E 1000 UNIT capsule  Take 1,000 Units by mouth every morning.           Follow-up Information    Follow up with [facility], [place]. Schedule an appointment as soon as possible for a visit  in 1 week.   Specialty:  Internal Medicine   Contact information:   [address] [place]. [address] [facility] [place] [address] [phone]       Follow up with Sabas Sous, MD In 2 months.   Specialty:  Hematology and Oncology   Contact information:   [address] [place] [address] [phone]       The results of significant diagnostics from this hospitalization (including imaging, microbiology, ancillary and laboratory) are listed below for reference.    Significant Diagnostic Studies: Ct Head Wo Contrast  08/19/2014   CLINICAL DATA:  Altered mental status.  Confusion.  EXAM: CT HEAD WITHOUT CONTRAST  TECHNIQUE: Contiguous axial images were obtained from the base of the skull through the vertex without intravenous contrast.  COMPARISON:  None.  FINDINGS: Mild diffuse cerebral atrophy. Low-attenuation changes in the deep white matter consistent with small vessel ischemia. No mass effect or midline shift. No abnormal extra-axial fluid collections. Gray-white matter junctions are distinct. Basal cisterns are not effaced. No evidence of acute intracranial hemorrhage. No depressed skull fractures. Vascular calcifications. Mass versus inspissated mucus in the sphenoid sinuses. Visualized paranasal sinuses otherwise clear. Sclerosis versus congenital hypoaeration of the right mastoid air cells.  IMPRESSION: No acute intracranial abnormalities. Chronic atrophy and small vessel ischemic changes. Soft tissue in the sphenoid sinus may represent mass, polyp, or inspissated mucus.   Electronically Signed   By: Burman Nieves M.D.   On: 08/19/2014 01:06   Dg Chest Portable 1 View  08/19/2014   CLINICAL DATA:  Two weeks of generalized weakness and fatigue. Initial encounter.  EXAM: PORTABLE CHEST - 1 VIEW  COMPARISON:  Chest radiograph performed 04/21/2014, and CT of the chest performed 04/19/2014  FINDINGS: The lungs are well-aerated and clear. There is no  evidence of focal opacification, pleural effusion or pneumothorax.  The cardiomediastinal silhouette is borderline enlarged. No acute osseous abnormalities are seen.  IMPRESSION: Borderline cardiomegaly; lungs remain grossly clear.   Electronically Signed   By: Roanna Raider M.D.   On: 08/19/2014 01:09    Microbiology: Recent Results (from the past 240 hour(s))  Urine culture     Status: None   Collection Time: 08/19/14 12:43 AM  Result Value Ref Range Status   Specimen Description URINE, RANDOM  Final   Special Requests ADDED 0214  Final   Colony Count   Final    >=100,000 COLONIES/ML Performed at [facility]    Culture   Final    ESCHERICHIA COLI Performed at [facility]    Report Status 08/21/2014  FINAL  Final   Organism ID, Bacteria ESCHERICHIA COLI  Final      Susceptibility   Escherichia coli - MIC*    AMPICILLIN <=2 SENSITIVE Sensitive     CEFAZOLIN <=4 SENSITIVE Sensitive     CEFTRIAXONE <=1 SENSITIVE Sensitive     CIPROFLOXACIN <=0.25 SENSITIVE Sensitive     GENTAMICIN <=1 SENSITIVE Sensitive     LEVOFLOXACIN <=0.12 SENSITIVE Sensitive     NITROFURANTOIN <=16 SENSITIVE Sensitive     TOBRAMYCIN <=1 SENSITIVE Sensitive     TRIMETH/SULFA <=20 SENSITIVE Sensitive     PIP/TAZO <=4 SENSITIVE Sensitive     * ESCHERICHIA COLI  Culture, blood (routine x 2)     Status: None (Preliminary result)   Collection Time: 08/19/14  9:36 AM  Result Value Ref Range Status   Specimen Description BLOOD LEFT FOREARM  Final   Special Requests BOTTLES DRAWN AEROBIC ONLY 9CC  Final   Culture   Final           BLOOD CULTURE RECEIVED NO GROWTH TO DATE CULTURE WILL BE HELD FOR 5 DAYS BEFORE ISSUING A FINAL NEGATIVE REPORT Performed at [facility]    Report Status PENDING  Incomplete  Culture, blood (routine x 2)     Status: None (Preliminary result)   Collection Time: 08/19/14  9:42 AM  Result Value Ref Range Status   Specimen Description BLOOD LEFT ANTECUBITAL   Final   Special Requests BOTTLES DRAWN AEROBIC ONLY 8CC  Final   Culture   Final           BLOOD CULTURE RECEIVED NO GROWTH TO DATE CULTURE WILL BE HELD FOR 5 DAYS BEFORE ISSUING A FINAL NEGATIVE REPORT Performed at [facility]    Report Status PENDING  Incomplete    Labs: Basic Metabolic Panel:  Recent Labs Lab 08/19/14 0035 08/19/14 0455 08/20/14 0545  NA 136 140 140  K 4.3 4.1 3.7  CL 101 100 107  CO2 30 32 24  GLUCOSE 150* 131* 130*  BUN 25* 22 24*  CREATININE 1.18* 1.06 1.02  CALCIUM 8.9 9.0 8.2*  MG 2.0  --   --    Liver Function Tests:  Recent Labs Lab 08/19/14 0035  AST 30  ALT 24  ALKPHOS 65  BILITOT 0.5  PROT 6.8  ALBUMIN 2.6*   CBC:  Recent Labs Lab 08/19/14 0035 08/19/14 0455 08/20/14 0545  WBC 13.7* 13.2* 11.1*  NEUTROABS 11.4*  --   --   HGB 10.2* 10.4* 9.8*  HCT 30.9* 31.3* 30.3*  MCV 86.6 85.3 85.8  PLT 340 334 375   Cardiac Enzymes:  Recent Labs Lab 08/19/14 0035  TROPONINI 0.03    Signed:  Pamella Pert  [facility] 08/21/2014, 3:34 PM

## 2014-08-21 NOTE — Progress Notes (Signed)
D/C orders received, pt for D/C home today.  IV and telemetry D/C.  Rx and D/C instructions given with verbalized understanding.  Family at bedside to assist with D/C.  Staff brought pt downstairs via wheelchair.  

## 2014-08-21 NOTE — Care Management Note (Signed)
    Page 1 of 1   08/21/2014     3:25:45 PM CARE MANAGEMENT NOTE 08/21/2014  Patient:  Zoe Robinson, Zoe Robinson   Account Number:  [id number]  Date Initiated:  08/21/2014  Documentation initiated by:  Elmer Bales  Subjective/Objective Assessment:   Patient was admitted with weakness, UTI. Lives at home with spouse.     Action/Plan:   Will follow for discharge needs pending PT/OT evals and physician orders.   Anticipated DC Date:  08/21/2014   Anticipated DC Plan:  HOME/SELF CARE         Choice offered to / List presented to:             Status of service:  Completed, signed off Medicare Important Message given?  YES (If response is "NO", the following Medicare IM given date fields will be blank) Date Medicare IM given:  08/21/2014 Medicare IM given by:  Elmer Bales Date Additional Medicare IM given:   Additional Medicare IM given by:    Discharge Disposition:    Per UR Regulation:  Reviewed for med. necessity/level of care/duration of stay  If discussed at Long Length of Stay Meetings, dates discussed:    Comments:

## 2014-08-25 LAB — CULTURE, BLOOD (ROUTINE X 2)
Culture: NO GROWTH
Culture: NO GROWTH

## 2014-11-12 ENCOUNTER — Ambulatory Visit (HOSPITAL_BASED_OUTPATIENT_CLINIC_OR_DEPARTMENT_OTHER): Payer: Medicare Other | Admitting: Hematology and Oncology

## 2014-11-12 ENCOUNTER — Other Ambulatory Visit (HOSPITAL_BASED_OUTPATIENT_CLINIC_OR_DEPARTMENT_OTHER): Payer: Medicare Other

## 2014-11-12 ENCOUNTER — Telehealth: Payer: Self-pay | Admitting: Hematology and Oncology

## 2014-11-12 DIAGNOSIS — D509 Iron deficiency anemia, unspecified: Secondary | ICD-10-CM

## 2014-11-12 DIAGNOSIS — Z862 Personal history of diseases of the blood and blood-forming organs and certain disorders involving the immune mechanism: Secondary | ICD-10-CM

## 2014-11-12 DIAGNOSIS — N184 Chronic kidney disease, stage 4 (severe): Secondary | ICD-10-CM

## 2014-11-12 LAB — CBC WITH DIFFERENTIAL/PLATELET
BASO%: 0.2 % (ref 0.0–2.0)
Basophils Absolute: 0 10*3/uL (ref 0.0–0.1)
EOS%: 0.1 % (ref 0.0–7.0)
Eosinophils Absolute: 0 10*3/uL (ref 0.0–0.5)
HCT: 31 % — ABNORMAL LOW (ref 34.8–46.6)
HGB: 10.3 g/dL — ABNORMAL LOW (ref 11.6–15.9)
LYMPH%: 18.6 % (ref 14.0–49.7)
MCH: 29.3 pg (ref 25.1–34.0)
MCHC: 33.2 g/dL (ref 31.5–36.0)
MCV: 88.3 fL (ref 79.5–101.0)
MONO#: 0.7 10*3/uL (ref 0.1–0.9)
MONO%: 6.5 % (ref 0.0–14.0)
NEUT#: 7.4 10*3/uL — ABNORMAL HIGH (ref 1.5–6.5)
NEUT%: 74.6 % (ref 38.4–76.8)
PLATELETS: 319 10*3/uL (ref 145–400)
RBC: 3.51 10*6/uL — AB (ref 3.70–5.45)
RDW: 13 % (ref 11.2–14.5)
WBC: 10 10*3/uL (ref 3.9–10.3)
lymph#: 1.9 10*3/uL (ref 0.9–3.3)

## 2014-11-12 NOTE — Assessment & Plan Note (Signed)
Normochromic normocytic anemia: Previous iron studies have been normal. Previously she had received Aranesp treatment 05/14/2014. I had discontinued Aranesp therapy because her hemoglobin has been about 10 g.  Recent hospitalization for UTI with encephalopathy. Her hemoglobin went down to 9.8 g and today it is 10.2 g.   Recommendation:  1. Continue to watch and monitor her without giving her any Aranesp therapy.  2. Return to clinic in 6 months for follow-up and recheck of her CBC and iron studies     

## 2014-11-12 NOTE — Telephone Encounter (Signed)
per pof to sch pt appt-gave pt copy of sch °

## 2014-11-12 NOTE — Progress Notes (Signed)
Patient Care Team: Lindwood Qua, MD as PCP - General (Internal Medicine) Lindwood Qua, MD (Internal Medicine)  DIAGNOSIS: anemia and hypercalcemia of unclear etiology: Anemia differential diagnosis MDS versus anemia of chronic disease  Prior treatment: Aranesp currently on hold because of hemoglobin is 11  CHIEF COMPLIANT: followup of anemia  INTERVAL HISTORY: Zoe Robinson is a 81 year old with above-mentioned history of normocytic anemia which we are following with observation. I have discontinued Aranesp therapy because her hemoglobin has been fairly stable. She was recently hospitalized in January with UTI and encephalopathy. She is recovered from this and is feeling a lot better. Denies any fatigue or shortness of breath or lightheadedness or dizziness.  REVIEW OF SYSTEMS:   Constitutional: Denies fevers, chills or abnormal weight loss Eyes: Denies blurriness of vision Ears, nose, mouth, throat, and face: Denies mucositis or sore throat Respiratory: Denies cough, dyspnea or wheezes Cardiovascular: Denies palpitation, chest discomfort or lower extremity swelling Gastrointestinal:  Denies nausea, heartburn or change in bowel habits Skin: Denies abnormal skin rashes Lymphatics: Denies new lymphadenopathy or easy bruising Neurological:Denies numbness, tingling or new weaknesses Behavioral/Psych: Mood is stable, no new changes  All other systems were reviewed with the patient and are negative.  I have reviewed the past medical history, past surgical history, social history and family history with the patient and they are unchanged from previous note.  ALLERGIES:  has No Known Allergies.  MEDICATIONS:  Current Outpatient Prescriptions  Medication Sig Dispense Refill  . nitroGLYCERIN (NITROSTAT) 0.4 MG SL tablet Place 0.4 mg under the tongue.    . nystatin (MYCOSTATIN) powder Apply to affected area 3 times daily    . acetaminophen (TYLENOL) 500 MG tablet Take 1,000 mg by mouth  every 8 (eight) hours.    . feeding supplement, ENSURE COMPLETE, (ENSURE COMPLETE) LIQD Take 237 mLs by mouth 3 (three) times daily between meals.    . ferrous sulfate 325 (65 FE) MG tablet Take 325 mg by mouth daily with breakfast.    . folic acid (FOLVITE) 1 MG tablet Take 1 mg by mouth every morning.     . magnesium gluconate (MAGONATE) 500 MG tablet Take 500 mg by mouth every morning.     . Multiple Vitamins-Minerals (MULTIVITAMIN PO) Take 1 tablet by mouth every morning.     Marland Kitchen oxybutynin (DITROPAN-XL) 10 MG 24 hr tablet Take 10 mg by mouth every morning.     . polyethylene glycol (MIRALAX / GLYCOLAX) packet Take 17 g by mouth daily as needed. (Patient taking differently: Take 17 g by mouth daily as needed for mild constipation. ) 14 each 0  . predniSONE (DELTASONE) 5 MG tablet Take 5-10 mg by mouth every other day. Take 5 mg by mouth every other day alternating with 10 mg by mouth every other day.    . Rivaroxaban (XARELTO) 15 MG TABS tablet Take 15 mg by mouth every evening.     . simvastatin (ZOCOR) 40 MG tablet Take 20 mg by mouth every evening.    . valsartan (DIOVAN) 320 MG tablet Take 320 mg by mouth every morning.     . vitamin E 1000 UNIT capsule Take 1,000 Units by mouth every morning.     No current facility-administered medications for this visit.    PHYSICAL EXAMINATION: ECOG PERFORMANCE STATUS: 1 - Symptomatic but completely ambulatory  Filed Vitals:   11/12/14 1508  BP: 133/56  Pulse: 74  Temp: 97.5 F (36.4 C)  Resp: 18   Filed Weights   11/12/14  1508  Weight: 107 lb 11.2 oz (48.852 kg)    GENERAL:alert, no distress and comfortable SKIN: skin color, texture, turgor are normal, no rashes or significant lesions EYES: normal, Conjunctiva are pink and non-injected, sclera clear OROPHARYNX:no exudate, no erythema and lips, buccal mucosa, and tongue normal  NECK: supple, thyroid normal size, non-tender, without nodularity LYMPH:  no palpable lymphadenopathy in the  cervical, axillary or inguinal LUNGS: clear to auscultation and percussion with normal breathing effort HEART: regular rate & rhythm and no murmurs and no lower extremity edema ABDOMEN:abdomen soft, non-tender and normal bowel sounds Musculoskeletal:no cyanosis of digits and no clubbing  NEURO: alert & oriented x 3 with fluent speech, no focal motor/sensory deficits  LABORATORY DATA:  I have reviewed the data as listed   Chemistry      Component Value Date/Time   NA 140 08/20/2014 0545   NA 142 08/06/2014 1405   K 3.7 08/20/2014 0545   K 4.8 08/06/2014 1405   CL 107 08/20/2014 0545   CO2 24 08/20/2014 0545   CO2 28 08/06/2014 1405   BUN 24* 08/20/2014 0545   BUN 23.3 08/06/2014 1405   CREATININE 1.02 08/20/2014 0545   CREATININE 0.9 08/06/2014 1405   CREATININE 2.01* 04/20/2014 0500      Component Value Date/Time   CALCIUM 8.2* 08/20/2014 0545   CALCIUM 9.8 08/06/2014 1405   ALKPHOS 65 08/19/2014 0035   ALKPHOS 49 08/06/2014 1405   AST 30 08/19/2014 0035   AST 25 08/06/2014 1405   ALT 24 08/19/2014 0035   ALT 16 08/06/2014 1405   BILITOT 0.5 08/19/2014 0035   BILITOT <0.20 08/06/2014 1405       Lab Results  Component Value Date   WBC 10.0 11/12/2014   HGB 10.3* 11/12/2014   HCT 31.0* 11/12/2014   MCV 88.3 11/12/2014   PLT 319 11/12/2014   NEUTROABS 7.4* 11/12/2014   ASSESSMENT & PLAN:  Iron deficiency anemia Normochromic normocytic anemia: Previous iron studies have been normal. Previously she had received Aranesp treatment 05/14/2014. I had discontinued Aranesp therapy because her hemoglobin has been about 10 g.  Recent hospitalization for UTI with encephalopathy. Her hemoglobin went down to 9.8 g and today it is 10.2 g.  Recommendation: 1. Continue to watch and monitor her without giving her any Aranesp therapy. 2. Return to clinic in 6 months for follow-up and recheck of her CBC and iron studies     Orders Placed This Encounter  Procedures  . CBC with  Differential    Standing Status: Future     Number of Occurrences:      Standing Expiration Date: 11/12/2015  . Ferritin    Standing Status: Future     Number of Occurrences:      Standing Expiration Date: 11/12/2015  . Iron and TIBC [facility]    Standing Status: Future     Number of Occurrences:      Standing Expiration Date: 11/12/2015   The patient has a good understanding of the overall plan. she agrees with it. She will call with any problems that may develop before her next visit here.   Sabas Sous, MD

## 2015-05-08 ENCOUNTER — Telehealth: Payer: Self-pay | Admitting: Hematology and Oncology

## 2015-05-08 NOTE — Telephone Encounter (Signed)
Called and left a message with new appointments per dr Pamelia Hoit meeting

## 2015-05-20 ENCOUNTER — Other Ambulatory Visit: Payer: Medicare Other

## 2015-05-20 ENCOUNTER — Ambulatory Visit: Payer: Medicare Other | Admitting: Hematology and Oncology

## 2015-05-20 NOTE — Assessment & Plan Note (Signed)
Normochromic normocytic anemia: Previous iron studies have been normal. Previously she had received Aranesp treatment 05/14/2014. I had discontinued Aranesp therapy because her hemoglobin has been about 10 g.  Recent hospitalization for UTI with encephalopathy. Her hemoglobin went down to 9.8 g and today it is 10.2 g.   Recommendation:  1. Continue to watch and monitor her without giving her any Aranesp therapy.  2. Return to clinic in 6 months for follow-up and recheck of her CBC and iron studies

## 2015-05-21 ENCOUNTER — Ambulatory Visit (HOSPITAL_BASED_OUTPATIENT_CLINIC_OR_DEPARTMENT_OTHER): Payer: Medicare Other | Admitting: Hematology and Oncology

## 2015-05-21 ENCOUNTER — Other Ambulatory Visit (HOSPITAL_BASED_OUTPATIENT_CLINIC_OR_DEPARTMENT_OTHER): Payer: Medicare Other

## 2015-05-21 ENCOUNTER — Encounter: Payer: Self-pay | Admitting: Hematology and Oncology

## 2015-05-21 VITALS — BP 141/75 | HR 76 | Resp 19 | Ht 59.0 in | Wt 106.3 lb

## 2015-05-21 DIAGNOSIS — Z862 Personal history of diseases of the blood and blood-forming organs and certain disorders involving the immune mechanism: Secondary | ICD-10-CM | POA: Diagnosis not present

## 2015-05-21 DIAGNOSIS — D509 Iron deficiency anemia, unspecified: Secondary | ICD-10-CM | POA: Diagnosis not present

## 2015-05-21 LAB — IRON AND TIBC CHCC
%SAT: 7 % — AB (ref 21–57)
Iron: 18 ug/dL — ABNORMAL LOW (ref 41–142)
TIBC: 266 ug/dL (ref 236–444)
UIBC: 248 ug/dL (ref 120–384)

## 2015-05-21 LAB — CBC WITH DIFFERENTIAL/PLATELET
BASO%: 0.3 % (ref 0.0–2.0)
Basophils Absolute: 0 10*3/uL (ref 0.0–0.1)
EOS%: 0 % (ref 0.0–7.0)
Eosinophils Absolute: 0 10*3/uL (ref 0.0–0.5)
HEMATOCRIT: 32.7 % — AB (ref 34.8–46.6)
HGB: 10.7 g/dL — ABNORMAL LOW (ref 11.6–15.9)
LYMPH%: 8.2 % — ABNORMAL LOW (ref 14.0–49.7)
MCH: 27.2 pg (ref 25.1–34.0)
MCHC: 32.6 g/dL (ref 31.5–36.0)
MCV: 83.6 fL (ref 79.5–101.0)
MONO#: 0.4 10*3/uL (ref 0.1–0.9)
MONO%: 3.5 % (ref 0.0–14.0)
NEUT#: 9.6 10*3/uL — ABNORMAL HIGH (ref 1.5–6.5)
NEUT%: 88 % — AB (ref 38.4–76.8)
PLATELETS: 435 10*3/uL — AB (ref 145–400)
RBC: 3.91 10*6/uL (ref 3.70–5.45)
RDW: 14.1 % (ref 11.2–14.5)
WBC: 11 10*3/uL — ABNORMAL HIGH (ref 3.9–10.3)
lymph#: 0.9 10*3/uL (ref 0.9–3.3)

## 2015-05-21 LAB — FERRITIN CHCC: Ferritin: 123 ng/ml (ref 9–269)

## 2015-05-21 NOTE — Progress Notes (Signed)
Patient Care Team: Lindwood Qua, MD as PCP - General (Internal Medicine) Lindwood Qua, MD (Internal Medicine)  DIAGNOSIS: anemia and hypercalcemia of unclear etiology: Anemia differential diagnosis MDS versus anemia of chronic disease  Prior treatment: Aranesp discontinued because of hemoglobin was over 10 g  CHIEF COMPLIANT: Denies any problems or concerns  INTERVAL HISTORY: Zoe Robinson is a 81 year old with above-mentioned history of anemia thought to be related to MDS and was previously treated with Aranesp therapy. Because her hemoglobin was greater than 10 g, I discontinued Aranesp. She is here today for routine follow-up. Reports no major problems or concerns. Denies any fatigue shortness of breath or palpitations or dizziness.  REVIEW OF SYSTEMS:   Constitutional: Denies fevers, chills or abnormal weight loss Eyes: Denies blurriness of vision Ears, nose, mouth, throat, and face: Denies mucositis or sore throat Respiratory: Denies cough, dyspnea or wheezes Cardiovascular: Denies palpitation, chest discomfort or lower extremity swelling Gastrointestinal:  Denies nausea, heartburn or change in bowel habits Skin: Denies abnormal skin rashes Lymphatics: Denies new lymphadenopathy or easy bruising Neurological:Denies numbness, tingling or new weaknesses Behavioral/Psych: Mood is stable, no new changes   All other systems were reviewed with the patient and are negative.  I have reviewed the past medical history, past surgical history, social history and family history with the patient and they are unchanged from previous note.  ALLERGIES:  has No Known Allergies.  MEDICATIONS:  Current Outpatient Prescriptions  Medication Sig Dispense Refill  . acetaminophen (TYLENOL) 500 MG tablet Take 1,000 mg by mouth every 8 (eight) hours.    . feeding supplement, ENSURE COMPLETE, (ENSURE COMPLETE) LIQD Take 237 mLs by mouth 3 (three) times daily between meals.    . ferrous sulfate  325 (65 FE) MG tablet Take 325 mg by mouth daily with breakfast.    . folic acid (FOLVITE) 1 MG tablet Take 1 mg by mouth every morning.     . magnesium gluconate (MAGONATE) 500 MG tablet Take 500 mg by mouth every morning.     . Multiple Vitamins-Minerals (MULTIVITAMIN PO) Take 1 tablet by mouth every morning.     . nitroGLYCERIN (NITROSTAT) 0.4 MG SL tablet Place 0.4 mg under the tongue.    . nystatin (MYCOSTATIN) powder Apply to affected area 3 times daily    . oxybutynin (DITROPAN-XL) 10 MG 24 hr tablet Take 10 mg by mouth every morning.     . polyethylene glycol (MIRALAX / GLYCOLAX) packet Take 17 g by mouth daily as needed. (Patient taking differently: Take 17 g by mouth daily as needed for mild constipation. ) 14 each 0  . predniSONE (DELTASONE) 5 MG tablet Take 5-10 mg by mouth every other day. Take 5 mg by mouth every other day alternating with 10 mg by mouth every other day.    . Rivaroxaban (XARELTO) 15 MG TABS tablet Take 15 mg by mouth every evening.     . simvastatin (ZOCOR) 40 MG tablet Take 20 mg by mouth every evening.    . valsartan (DIOVAN) 320 MG tablet Take 320 mg by mouth every morning.     . vitamin E 1000 UNIT capsule Take 1,000 Units by mouth every morning.     No current facility-administered medications for this visit.    PHYSICAL EXAMINATION: ECOG PERFORMANCE STATUS: 0 - Asymptomatic  Filed Vitals:   05/21/15 1409  BP: 141/75  Pulse: 76  Resp: 19   Filed Weights   05/21/15 1409  Weight: 106 lb 4.8 oz (48.217 kg)  GENERAL:alert, no distress and comfortable SKIN: skin color, texture, turgor are normal, no rashes or significant lesions EYES: normal, Conjunctiva are pink and non-injected, sclera clear OROPHARYNX:no exudate, no erythema and lips, buccal mucosa, and tongue normal  NECK: supple, thyroid normal size, non-tender, without nodularity LYMPH:  no palpable lymphadenopathy in the cervical, axillary or inguinal LUNGS: clear to auscultation and  percussion with normal breathing effort HEART: regular rate & rhythm and no murmurs and no lower extremity edema ABDOMEN:abdomen soft, non-tender and normal bowel sounds Musculoskeletal:no cyanosis of digits and no clubbing  NEURO: alert & oriented x 3 with fluent speech, no focal motor/sensory deficits  LABORATORY DATA:  I have reviewed the data as listed   Chemistry      Component Value Date/Time   NA 140 08/20/2014 0545   NA 142 08/06/2014 1405   K 3.7 08/20/2014 0545   K 4.8 08/06/2014 1405   CL 107 08/20/2014 0545   CO2 24 08/20/2014 0545   CO2 28 08/06/2014 1405   BUN 24* 08/20/2014 0545   BUN 23.3 08/06/2014 1405   CREATININE 1.02 08/20/2014 0545   CREATININE 0.9 08/06/2014 1405   CREATININE 2.01* 04/20/2014 0500      Component Value Date/Time   CALCIUM 8.2* 08/20/2014 0545   CALCIUM 9.8 08/06/2014 1405   ALKPHOS 65 08/19/2014 0035   ALKPHOS 49 08/06/2014 1405   AST 30 08/19/2014 0035   AST 25 08/06/2014 1405   ALT 24 08/19/2014 0035   ALT 16 08/06/2014 1405   BILITOT 0.5 08/19/2014 0035   BILITOT <0.20 08/06/2014 1405       Lab Results  Component Value Date   WBC 11.0* 05/21/2015   HGB 10.7* 05/21/2015   HCT 32.7* 05/21/2015   MCV 83.6 05/21/2015   PLT 435* 05/21/2015   NEUTROABS 9.6* 05/21/2015   ASSESSMENT & PLAN:  Iron deficiency anemia Normochromic normocytic anemia: Previous iron studies have been normal. Previously she had received Aranesp treatment 05/14/2014. I had discontinued Aranesp therapy because her hemoglobin has been about 10 g.  Recent hospitalization for UTI with encephalopathy. Her hemoglobin went down to 9.8 g and today it is 10.2 g.   Recommendation:  1. Aranesp discontinued Patient will be followed on an as-needed basis  No orders of the defined types were placed in this encounter.   The patient has a good understanding of the overall plan. she agrees with it. she will call with any problems that may develop before the next  visit here.   Sabas Sous, MD 05/21/2015

## 2016-02-19 DIAGNOSIS — E119 Type 2 diabetes mellitus without complications: Secondary | ICD-10-CM | POA: Insufficient documentation

## 2017-02-10 DIAGNOSIS — Z7952 Long term (current) use of systemic steroids: Secondary | ICD-10-CM | POA: Insufficient documentation

## 2017-02-10 DIAGNOSIS — Z862 Personal history of diseases of the blood and blood-forming organs and certain disorders involving the immune mechanism: Secondary | ICD-10-CM | POA: Insufficient documentation

## 2017-02-10 DIAGNOSIS — Z8679 Personal history of other diseases of the circulatory system: Secondary | ICD-10-CM | POA: Insufficient documentation

## 2017-02-10 DIAGNOSIS — Z87440 Personal history of urinary (tract) infections: Secondary | ICD-10-CM | POA: Insufficient documentation

## 2017-02-10 DIAGNOSIS — M810 Age-related osteoporosis without current pathological fracture: Secondary | ICD-10-CM | POA: Insufficient documentation

## 2017-02-10 DIAGNOSIS — Z7901 Long term (current) use of anticoagulants: Secondary | ICD-10-CM | POA: Insufficient documentation

## 2017-02-10 DIAGNOSIS — Z8639 Personal history of other endocrine, nutritional and metabolic disease: Secondary | ICD-10-CM | POA: Insufficient documentation

## 2017-02-10 NOTE — Progress Notes (Signed)
Office Visit Note  Patient: Zoe Robinson             Date of Birth: 08-25-1931           MRN: [id number]             PCP: Gennette Pac, NP Referring: Gennette Pac, NP Visit Date: 02/14/2017 Occupation: _0 @    Subjective:  New Patient (Initial Visit) (arthritis pain.  most pain in bilateral hand, feet and ankles.  referred by Dr. Elisha Ponder.)   History of Present Illness: Zoe Robinson is a 83 y.o. female with history of sero positive rheumatoid arthritis she is accompanied by her daughter today. According to the patient herself arthritis is started about 15-20 years ago for which she had seen only her primary care physician . About 3 years ago she started having increased pain and discomfort. She had been under care of Dr. Charlestine Night who started patient on prednisone and colchicine and methotrexate. He was concerned about gout and rheumatoid arthritis overlap. Later he discontinued colchicine and kept her on methotrexate. He felt that methotrexate was improving arthritis. Although there is no follow-up after that. The maximum dose of methotrexate she was on was 70 tablets per week. According to patient's daughter there was no follow-up given and she did not return for follow-up visits. The methotrexate was discontinued. Her joints have deteriorated over time. . She's been taking prednisone since then. She's been on prednisone 10 mg alternating with 5 mg now. She denies any joint pain or joint swelling currently. She had to quit driving because she could not make relate the key in the car. She has decreased grip strength. She has lot discomfort in the morning and stiffness in the morning.  Activities of Daily Living:  Patient reports morning stiffness for 15 minutes.   Patient Denies nocturnal pain.  Difficulty dressing/grooming: Denies Difficulty climbing stairs: Reports Difficulty getting out of chair: Reports Difficulty using hands for taps, buttons, cutlery, and/or  writing: Reports   Review of Systems  Constitutional: Negative for fatigue, night sweats, weight gain, weight loss and weakness.  HENT: Positive for mouth dryness. Negative for mouth sores, trouble swallowing, trouble swallowing and nose dryness.   Eyes: Negative for pain, redness, visual disturbance and dryness.  Respiratory: Negative for cough, shortness of breath and difficulty breathing.   Cardiovascular: Positive for hypertension. Negative for chest pain, palpitations, irregular heartbeat and swelling in legs/feet.  Gastrointestinal: Negative for blood in stool, constipation and diarrhea.  Endocrine: Negative for increased urination.  Genitourinary: Negative for vaginal dryness.  Musculoskeletal: Positive for arthralgias, joint pain, joint swelling and morning stiffness. Negative for myalgias, muscle weakness, muscle tenderness and myalgias.  Skin: Positive for color change. Negative for rash, hair loss, skin tightness, ulcers and sensitivity to sunlight.  Allergic/Immunologic: Negative for susceptible to infections.  Neurological: Negative for dizziness, memory loss and night sweats.  Hematological: Negative for swollen glands.  Psychiatric/Behavioral: Negative for depressed mood and sleep disturbance. The patient is not nervous/anxious.     PMFS History:  Patient Active Problem List   Diagnosis Date Noted  . On prednisone therapy 02/10/2017  . Age-related osteoporosis without current pathological fracture 02/10/2017  . History of pulmonary hypertension 02/10/2017  . History of atrial fibrillation 02/10/2017  . History of anemia 02/10/2017  . History of recurrent UTIs 02/10/2017  . History of hypercalcemia 02/10/2017  . On anticoagulant therapy 02/10/2017  . Acute encephalopathy 08/19/2014  . Neutropenia ([place]) 04/20/2014  . Fever, unspecified 04/20/2014  .  Unspecified constipation 04/20/2014  . Hypercalcemia 04/15/2014  . Acute renal failure ([place]) 04/15/2014  . Generalized  weakness 04/15/2014  . Dehydration 04/15/2014  . UTI (urinary tract infection) 04/15/2014  . Iron deficiency anemia 04/15/2014  . CKD (chronic kidney disease)   . RA (rheumatoid arthritis) ([place])   . Hypertension   . Atrial fibrillation [facility])     Past Medical History:  Diagnosis Date  . Arthritis   . Atrial fibrillation ([place])   . CKD (chronic kidney disease)   . Hypertension   . RA (rheumatoid arthritis) (HCC)     Family History  Problem Relation Age of Onset  . Heart attack Mother   . Heart attack Father   . Heart attack Brother   . Heart attack Brother   . Stomach cancer Brother    Past Surgical History:  Procedure Laterality Date  . ABDOMINAL HYSTERECTOMY     Social History   Social History Narrative  . No narrative on file     Objective: Vital Signs: BP 136/67 (BP Location: Left Arm, Patient Position: Sitting, Cuff Size: Normal)   Pulse 65   Ht '4\' 11"'$  (1.499 m)   Wt 116 lb (52.6 kg)   BMI 23.43 kg/m    Physical Exam  Constitutional: She is oriented to person, place, and time. She appears well-developed and well-nourished.  HENT:  Head: Normocephalic and atraumatic.  Eyes: Conjunctivae and EOM are normal.  Neck: Normal range of motion.  Cardiovascular: Normal rate, regular rhythm, normal heart sounds and intact distal pulses.   Pulmonary/Chest: Effort normal and breath sounds normal.  Abdominal: Soft. Bowel sounds are normal.  Lymphadenopathy:    She has no cervical adenopathy.  Neurological: She is alert and oriented to person, place, and time.  Skin: Skin is warm and dry. Capillary refill takes less than 2 seconds.  Bruising on the left side of her face from a recent injury  Psychiatric: She has a normal mood and affect. Her behavior is normal.  Nursing note and vitals reviewed.    Musculoskeletal Exam: C-spine and thoracic lumbar spine good range of motion. Shoulder joints elbow joints are good range of motion. She has limited range of motion of  bilateral wrists joint she synovitis in her hands as described below. Hip joints knee joints are good range of motion. She is warmth swelling in bilateral knee joints. She has overcrowding of toes with that some discomfort but not for synovitis.  CDAI Exam: CDAI Homunculus Exam:   Tenderness:  RUE: wrist LUE: wrist Right hand: 2nd MCP, 3rd MCP, 2nd PIP and 3rd PIP Left hand: 2nd MCP and 3rd MCP RLE: tibiofemoral LLE: tibiofemoral  Swelling:  Right hand: 2nd MCP Left hand: 2nd MCP and 3rd MCP RLE: tibiofemoral LLE: tibiofemoral  Joint Counts:  CDAI Tender Joint count: 10 CDAI Swollen Joint count: 5  Global Assessments:  Patient Global Assessment: 7 Provider Global Assessment: 7  CDAI Calculated Score: 29    Investigation: Findings:  12/05/2013 RF 202, ESR 64, CBC and CMP normal   Labs from [facility] 02/10/2017 CBC WBC 8.6 hemoglobin 10.8 platelets 369, CMP normal GFR more than 60  Was treated with Methotrexate and Prednisone '10mg'$  2015 by Dr Charlestine Night     Imaging: Xr Foot 2 Views Left  Result Date: 02/14/2017 Left third MTP joint narrowing was noted. Erosive changes were noted in the first MTP joint. PIP/DIP narrowing was noted. No intertarsal joint space narrowing was noted. Subtalar narrowing was noted. Small calcaneal spur was  noted. Impression: These findings are consistent with rheumatoid arthritis.  Xr Foot 2 Views Right  Result Date: 02/14/2017 First MTP all PIP/DIP narrowing was noted. Juxta articular osteopenia was noted. No erosive changes were noted. Subtalar joint space narrowing was noted. Small calcaneal spur was noted. Impression: These findings are consistent with rheumatoid arthritis.  Xr Hand 2 View Left  Result Date: 02/14/2017 Juxta articular osteopenia needed. Mild PIP/DIP narrowing was noted. No significant MCP or intercarpal joint space narrowing was noted. No erosive changes were noted. Impression: These findings are consistent with rheumatoid  arthritis  Xr Hand 2 View Right  Result Date: 02/14/2017 Juxta articular osteopenia was noted. Invagination of right second and third MCP joint was noted. Subluxation of first second and third MCP joint was noted. Subluxation of first DIP joint was noted. Narrowing of all PIP/DIP joints were noted. Some metacarpocarpal joints narrowing was noted. No intercarpal or radiocarpal joint space narrowing was noted. No erosive changes were noted. Impression: These findings are consistent with rheumatoid arthritis and osteoarthritis overlap  Xr Knee 3 View Left  Result Date: 02/14/2017 Medial and lateral compartment moderate narrowing was noted. No chondrocalcinosis was noted. Mild chondromalacia patella was noted. Impression: These findings are consistent with rheumatoid arthritis/osteoarthritis of the knee  Xr Knee 3 View Right  Result Date: 02/14/2017 Medial and lateral compartment moderate narrowing was noted. No chondrocalcinosis was noted. Mild chondromalacia patella was noted. Impression: These findings are consistent with rheumatoid arthritis/osteoarthritis of the knee   Speciality Comments: No specialty comments available.    Procedures:  No procedures performed Allergies: Patient has no known allergies.   Assessment / Plan:     Visit Diagnoses: Rheumatoid arthritis involving multiple sites with positive rheumatoid factor (HCC) -clinically she appears to have rheumatoid arthritis with involvement of her hands feet and knee joints she has synovitis in her MCP joints. I do not have any previous labs available to confirm. I'll obtain following labs today. Plan: Sedimentation rate, Rheumatoid factor, Cyclic citrul peptide antibody, IgG  On prednisone therapy - Alternates 5 mg with 10 mg dose for long time.  High risk medication use -patient was treated with methotrexate 7 tablets per week along with folic acid by Dr. Charlestine Night until 3 years ago and then it was stopped as patient lost follow-up.  She's been on prednisone since then. There was also question about gout. She was given colchicine for some time and then the colchicine was discontinued. I will obtain following labs today. Detailed counseling regarding The methotrexate was provided. Indications side effects contraindications were discussed. Once we have labs results available we can start her on methotrexate. The plan is to start her on methotrexate 6 tablets by mouth every week and keep her on that dose. And at folic acid 1 mg by mouth daily. She may need more aggressive therapy with the severely of her joint disease. Plan: CBC with Differential/Platelet, COMPLETE METABOLIC PANEL WITH GFR, Urinalysis, Routine w reflex microscopic, Quantiferon tb gold assay (blood), IgG, IgA, IgM, Glucose 6 phosphate dehydrogenase, Serum protein electrophoresis with reflex, HIV antibody, Hepatitis B core antibody, IgM, Hepatitis B surface antigen, Hepatitis C antibody   Pain in both hands - Plan: XR Hand 2 View Right, XR Hand 2 View Left, ANA, Uric acid  Chronic pain of both knees - Plan: XR KNEE 3 VIEW RIGHT, XR KNEE 3 VIEW LEFT  Pain in both feet - Plan: XR Foot 2 Views Right, XR Foot 2 Views Left  Generalized weakness - Plan: CK,  TSH  Age-related osteoporosis without current pathological fracture: According to patient's daughter she's been getting bone densities through her PCP. I do not see that she's on any treatment. I've advised him to discuss that further with the PCP.  History of vertebral fracture  History of pulmonary hypertension  History of atrial fibrillation  On anticoagulant therapy - Xarelto   History of anemia  Hypercalcemia  History of recurrent UTIs     Orders: Orders Placed This Encounter  Procedures  . XR Hand 2 View Right  . XR Hand 2 View Left  . XR KNEE 3 VIEW RIGHT  . XR KNEE 3 VIEW LEFT  . XR Foot 2 Views Right  . XR Foot 2 Views Left  . Urinalysis, Routine w reflex microscopic  . Sedimentation rate    . CK  . TSH  . ANA  . Rheumatoid factor  . Cyclic citrul peptide antibody, IgG  . Uric acid  . Quantiferon tb gold assay (blood)  . IgG, IgA, IgM  . Glucose 6 phosphate dehydrogenase  . Serum protein electrophoresis with reflex  . HIV antibody  . Hepatitis B core antibody, IgM  . Hepatitis B surface antigen  . Hepatitis C antibody   No orders of the defined types were placed in this encounter.   Face-to-face time spent with patient was 30 minutes. 50% of time was spent in counseling and coordination of care.  Follow-Up Instructions: Return for Rheumatoid arthritis.   Bo Merino, MD  Note - This record has been created using Editor, commissioning.  Chart creation errors have been sought, but may not always  have been located. Such creation errors do not reflect on  the standard of medical care.

## 2017-02-14 ENCOUNTER — Ambulatory Visit (INDEPENDENT_AMBULATORY_CARE_PROVIDER_SITE_OTHER): Payer: Medicare Other | Admitting: Rheumatology

## 2017-02-14 ENCOUNTER — Ambulatory Visit (INDEPENDENT_AMBULATORY_CARE_PROVIDER_SITE_OTHER): Payer: Self-pay

## 2017-02-14 ENCOUNTER — Ambulatory Visit (INDEPENDENT_AMBULATORY_CARE_PROVIDER_SITE_OTHER): Payer: Medicare Other

## 2017-02-14 ENCOUNTER — Encounter: Payer: Self-pay | Admitting: Rheumatology

## 2017-02-14 VITALS — BP 136/67 | HR 65 | Ht 59.0 in | Wt 116.0 lb

## 2017-02-14 DIAGNOSIS — Z8781 Personal history of (healed) traumatic fracture: Secondary | ICD-10-CM | POA: Diagnosis not present

## 2017-02-14 DIAGNOSIS — R531 Weakness: Secondary | ICD-10-CM | POA: Diagnosis not present

## 2017-02-14 DIAGNOSIS — M79671 Pain in right foot: Secondary | ICD-10-CM

## 2017-02-14 DIAGNOSIS — Z862 Personal history of diseases of the blood and blood-forming organs and certain disorders involving the immune mechanism: Secondary | ICD-10-CM

## 2017-02-14 DIAGNOSIS — M25562 Pain in left knee: Secondary | ICD-10-CM

## 2017-02-14 DIAGNOSIS — M79672 Pain in left foot: Secondary | ICD-10-CM

## 2017-02-14 DIAGNOSIS — Z7901 Long term (current) use of anticoagulants: Secondary | ICD-10-CM

## 2017-02-14 DIAGNOSIS — M0579 Rheumatoid arthritis with rheumatoid factor of multiple sites without organ or systems involvement: Secondary | ICD-10-CM

## 2017-02-14 DIAGNOSIS — M79641 Pain in right hand: Secondary | ICD-10-CM

## 2017-02-14 DIAGNOSIS — M25561 Pain in right knee: Secondary | ICD-10-CM | POA: Diagnosis not present

## 2017-02-14 DIAGNOSIS — M79642 Pain in left hand: Secondary | ICD-10-CM

## 2017-02-14 DIAGNOSIS — Z87440 Personal history of urinary (tract) infections: Secondary | ICD-10-CM

## 2017-02-14 DIAGNOSIS — Z8679 Personal history of other diseases of the circulatory system: Secondary | ICD-10-CM | POA: Diagnosis not present

## 2017-02-14 DIAGNOSIS — M810 Age-related osteoporosis without current pathological fracture: Secondary | ICD-10-CM | POA: Diagnosis not present

## 2017-02-14 DIAGNOSIS — Z79899 Other long term (current) drug therapy: Secondary | ICD-10-CM

## 2017-02-14 DIAGNOSIS — G8929 Other chronic pain: Secondary | ICD-10-CM

## 2017-02-14 DIAGNOSIS — Z7952 Long term (current) use of systemic steroids: Secondary | ICD-10-CM

## 2017-02-14 LAB — TSH: TSH: 1.28 mIU/L

## 2017-02-14 NOTE — Patient Instructions (Addendum)
If your baseline labs are negative, we will plan to start you on methotrexate 6 tablets weekly.  Please get labs in 2 weeks.  Continue taking folic acid 1 mg by mouth daily  Standing Labs We placed an order today for your standing lab work.    Please come back and get your standing labs in 2 weeks, then every 2 months  We have open lab Monday through Friday from 8:30-11:30 AM and 1:30-4 PM at the office of Dr. Pollyann Savoy.   The office is located at [address], [place], [place] [address] No appointment is necessary.   Labs are drawn by [facility].  You may receive a bill from [facility] for your lab work. If you have any questions regarding directions or hours of operation,  please call [phone].    Methotrexate tablets What is this medicine? METHOTREXATE (METH oh TREX ate) is a chemotherapy drug used to treat cancer including breast cancer, leukemia, and lymphoma. This medicine can also be used to treat psoriasis and certain kinds of arthritis. This medicine may be used for other purposes; ask your health care provider or pharmacist if you have questions. COMMON BRAND NAME(S): Rheumatrex, Trexall What should I tell my health care provider before I take this medicine? They need to know if you have any of these conditions: -fluid in the stomach area or lungs -if you often drink alcohol -infection or immune system problems -kidney disease or on hemodialysis -liver disease -low blood counts, like low white cell, platelet, or red cell counts -lung disease -radiation therapy -stomach ulcers -ulcerative colitis -an unusual or allergic reaction to methotrexate, other medicines, foods, dyes, or preservatives -pregnant or trying to get pregnant -breast-feeding How should I use this medicine? Take this medicine by mouth with a glass of water. Follow the directions on the prescription label. Take your medicine at regular intervals. Do not take it more often than directed.  Do not stop taking except on your doctor's advice. Make sure you know why you are taking this medicine and how often you should take it. If this medicine is used for a condition that is not cancer, like arthritis or psoriasis, it should be taken weekly, NOT daily. Taking this medicine more often than directed can cause serious side effects, even death. Talk to your healthcare provider about safe handling and disposal of this medicine. You may need to take special precautions. Talk to your pediatrician regarding the use of this medicine in children. While this drug may be prescribed for selected conditions, precautions do apply. Overdosage: If you think you have taken too much of this medicine contact a poison control center or emergency room at once. NOTE: This medicine is only for you. Do not share this medicine with others. What if I miss a dose? If you miss a dose, talk with your doctor or health care professional. Do not take double or extra doses. What may interact with this medicine? This medicine may interact with the following medication: -acitretin -aspirin and aspirin-like medicines including salicylates -azathioprine -certain antibiotics like penicillins, tetracycline, and chloramphenicol -cyclosporine -gold -hydroxychloroquine -live virus vaccines -NSAIDs, medicines for pain and inflammation, like ibuprofen or naproxen -other cytotoxic agents -penicillamine -phenylbutazone -phenytoin -probenecid -retinoids such as isotretinoin and tretinoin -steroid medicines like prednisone or cortisone -sulfonamides like sulfasalazine and trimethoprim/sulfamethoxazole -theophylline This list may not describe all possible interactions. Give your health care provider a list of all the medicines, herbs, non-prescription drugs, or dietary supplements you use. Also tell them  if you smoke, drink alcohol, or use illegal drugs. Some items may interact with your medicine. What should I watch for  while using this medicine? Avoid alcoholic drinks. This medicine can make you more sensitive to the sun. Keep out of the sun. If you cannot avoid being in the sun, wear protective clothing and use sunscreen. Do not use sun lamps or tanning beds/booths. You may need blood work done while you are taking this medicine. Call your doctor or health care professional for advice if you get a fever, chills or sore throat, or other symptoms of a cold or flu. Do not treat yourself. This drug decreases your body's ability to fight infections. Try to avoid being around people who are sick. This medicine may increase your risk to bruise or bleed. Call your doctor or health care professional if you notice any unusual bleeding. Check with your doctor or health care professional if you get an attack of severe diarrhea, nausea and vomiting, or if you sweat a lot. The loss of too much body fluid can make it dangerous for you to take this medicine. Talk to your doctor about your risk of cancer. You may be more at risk for certain types of cancers if you take this medicine. Both men and women must use effective birth control with this medicine. Do not become pregnant while taking this medicine or until at least 1 normal menstrual cycle has occurred after stopping it. Women should inform their doctor if they wish to become pregnant or think they might be pregnant. Men should not father a child while taking this medicine and for 3 months after stopping it. There is a potential for serious side effects to an unborn child. Talk to your health care professional or pharmacist for more information. Do not breast-feed an infant while taking this medicine. What side effects may I notice from receiving this medicine? Side effects that you should report to your doctor or health care professional as soon as possible: -allergic reactions like skin rash, itching or hives, swelling of the face, lips, or tongue -breathing problems or  shortness of breath -diarrhea -dry, nonproductive cough -low blood counts - this medicine may decrease the number of white blood cells, red blood cells and platelets. You may be at increased risk for infections and bleeding. -mouth sores -redness, blistering, peeling or loosening of the skin, including inside the mouth -signs of infection - fever or chills, cough, sore throat, pain or trouble passing urine -signs and symptoms of bleeding such as bloody or black, tarry stools; red or dark-brown urine; spitting up blood or brown material that looks like coffee grounds; red spots on the skin; unusual bruising or bleeding from the eye, gums, or nose -signs and symptoms of kidney injury like trouble passing urine or change in the amount of urine -signs and symptoms of liver injury like dark yellow or brown urine; general ill feeling or flu-like symptoms; light-colored stools; loss of appetite; nausea; right upper belly pain; unusually weak or tired; yellowing of the eyes or skin Side effects that usually do not require medical attention (report to your doctor or health care professional if they continue or are bothersome): -dizziness -hair loss -tiredness -upset stomach -vomiting This list may not describe all possible side effects. Call your doctor for medical advice about side effects. You may report side effects to FDA at 1-800-FDA-1088. Where should I keep my medicine? Keep out of the reach of children. Store at room temperature between 20 and  25 degrees C (68 and 77 degrees F). Protect from light. Throw away any unused medicine after the expiration date. NOTE: This sheet is a summary. It may not cover all possible information. If you have questions about this medicine, talk to your doctor, pharmacist, or health care provider.  2018 Elsevier/Gold Standard (2015-03-17 05:39:22)

## 2017-02-14 NOTE — Progress Notes (Signed)
Pharmacy Note  Subjective: Patient presents today to the [facility] to see Dr. Corliss Skains.  Patient was previously on methotrexate per Dr. Kellie Simmering.  I was asked to counsel the patient on methotrexate and the plan is to restart patient on methotrexate once her lab results return.  Patient seen by the pharmacist for counseling on methotrexate.    Objective: CBC normal except for HgB 10.8 and Hct 32 on 02/10/17 CMP normal on 02/10/17  TB Gold: ordered today Hepatitis panel: ordered today HIV: ordered today  Chest-xray:  08/19/2014 "IMPRESSION: Borderline cardiomegaly; lungs remain grossly clear."  Contraception: post-menopausal  Alcohol use: denies  Assessment/Plan:  Patient was counseled on the purpose, proper use, and adverse effects of methotrexate including nausea, infection, and signs and symptoms of pneumonitis.  Reviewed instructions with patient to take methotrexate weekly along with folic acid daily.  Discussed the importance of frequent monitoring of kidney and liver function and blood counts.  Counseled patient to avoid NSAIDs and alcohol while on methotrexate.  Provided patient with educational materials on methotrexate and answered all questions.  Advised patient to get annual influenza vaccine and to get a pneumococcal vaccine if patient has not already had one.  Patient voiced understanding.  Patient consented to methotrexate use.  Will upload into chart.    Lilla Shook, Pharm.D., BCPS Clinical Pharmacist Pager: [phone] Phone: [phone] 02/14/2017 9:48 AM

## 2017-02-15 LAB — URINALYSIS, ROUTINE W REFLEX MICROSCOPIC
Bilirubin Urine: NEGATIVE
GLUCOSE, UA: NEGATIVE
HGB URINE DIPSTICK: NEGATIVE
Ketones, ur: NEGATIVE
Nitrite: NEGATIVE
PH: 7.5 (ref 5.0–8.0)
Protein, ur: NEGATIVE
Specific Gravity, Urine: 1.009 (ref 1.001–1.035)

## 2017-02-15 LAB — HEPATITIS C ANTIBODY: HCV Ab: NEGATIVE

## 2017-02-15 LAB — ANA: Anti Nuclear Antibody(ANA): POSITIVE — AB

## 2017-02-15 LAB — IGG, IGA, IGM
IgA: 372 mg/dL (ref 81–463)
IgG (Immunoglobin G), Serum: 1506 mg/dL (ref 694–1618)
IgM, Serum: 59 mg/dL (ref 48–271)

## 2017-02-15 LAB — URINALYSIS, MICROSCOPIC ONLY
Casts: NONE SEEN [LPF]
Crystals: NONE SEEN [HPF]
RBC / HPF: NONE SEEN RBC/HPF (ref <=2)
YEAST: NONE SEEN [HPF]

## 2017-02-15 LAB — ANTI-NUCLEAR AB-TITER (ANA TITER)

## 2017-02-15 LAB — HEPATITIS B CORE ANTIBODY, IGM: HEP B C IGM: NONREACTIVE

## 2017-02-15 LAB — HIV ANTIBODY (ROUTINE TESTING W REFLEX): HIV 1&2 Ab, 4th Generation: NONREACTIVE

## 2017-02-15 LAB — SEDIMENTATION RATE: Sed Rate: 51 mm/hr — ABNORMAL HIGH (ref 0–30)

## 2017-02-15 LAB — RHEUMATOID FACTOR: Rhuematoid fact SerPl-aCnc: 91 IU/mL — ABNORMAL HIGH (ref <14)

## 2017-02-15 LAB — HEPATITIS B SURFACE ANTIGEN: Hepatitis B Surface Ag: NEGATIVE

## 2017-02-15 LAB — CYCLIC CITRUL PEPTIDE ANTIBODY, IGG

## 2017-02-15 LAB — URIC ACID: URIC ACID, SERUM: 4.6 mg/dL (ref 2.5–7.0)

## 2017-02-15 LAB — CK: Total CK: 50 U/L (ref 29–143)

## 2017-02-15 LAB — GLUCOSE 6 PHOSPHATE DEHYDROGENASE: G-6PDH: 22.8 U/g Hgb — ABNORMAL HIGH (ref 7.0–20.5)

## 2017-02-16 LAB — QUANTIFERON TB GOLD ASSAY (BLOOD)
Mitogen-Nil: 0.22 IU/mL
Quantiferon Nil Value: 0.07 IU/mL
Quantiferon Tb Ag Minus Nil Value: 0 IU/mL

## 2017-02-17 ENCOUNTER — Telehealth: Payer: Self-pay | Admitting: Pharmacist

## 2017-02-17 DIAGNOSIS — N390 Urinary tract infection, site not specified: Secondary | ICD-10-CM

## 2017-02-17 LAB — PROTEIN ELECTROPHORESIS, SERUM, WITH REFLEX
ALBUMIN ELP: 3.7 g/dL — AB (ref 3.8–4.8)
ALPHA-1-GLOBULIN: 0.4 g/dL — AB (ref 0.2–0.3)
Alpha-2-Globulin: 1 g/dL — ABNORMAL HIGH (ref 0.5–0.9)
BETA GLOBULIN: 0.5 g/dL (ref 0.4–0.6)
Beta 2: 0.4 g/dL (ref 0.2–0.5)
GAMMA GLOBULIN: 1.4 g/dL (ref 0.8–1.7)
Total Protein, Serum Electrophoresis: 7.6 g/dL (ref 6.1–8.1)

## 2017-02-17 NOTE — Telephone Encounter (Signed)
-----  Message from Bo Merino, MD sent at 02/17/2017  2:17 PM EDT ----- Positive ANA 1:160 homogeneous, positive rheumatoid factor, positive anti-CCP, elevated ESR, TB Gold indeterminate. Previous patient of Dr. Charlestine Night currently on prednisone and methotrexate. We can repeat TB gold or get PPD.

## 2017-02-17 NOTE — Telephone Encounter (Signed)
I reviewed results with patient. Discussed options of repeating TB Gold or getting TB skin test.  Patient asked that I discuss this with her daughter.  I spoke to [name] who reports they will get TB skin test at PCP office.  They will fax [place] results.    Patient reports she was diagnosed with a UTI from her PCP.  She is on antibiotics for 7 days.  Advised patient we cannot start methotrexate until UTI is resolved.    Lilla Shook, Pharm.D., BCPS, CPP Clinical Pharmacist Pager: [phone] Phone: [phone] 02/17/2017 4:53 PM

## 2017-02-17 NOTE — Progress Notes (Signed)
Positive ANA 1:160 homogeneous, positive rheumatoid factor, positive anti-CCP, elevated ESR, TB Gold indeterminate. Previous patient of Dr. Charlestine Night currently on prednisone and methotrexate. We can repeat TB gold or get PPD.

## 2017-02-18 NOTE — Addendum Note (Signed)
Addended by: Henriette Combs on: 02/18/2017 03:40 PM   Modules accepted: Orders

## 2017-02-18 NOTE — Telephone Encounter (Signed)
Urine cx after the course of Abx. Refer to Urology for recurrent UTI.

## 2017-02-18 NOTE — Telephone Encounter (Signed)
Referral placed and patient advised of recommendation. Patient verbalized understanding.

## 2017-02-22 ENCOUNTER — Telehealth: Payer: Self-pay | Admitting: Rheumatology

## 2017-02-22 DIAGNOSIS — Z9225 Personal history of immunosupression therapy: Secondary | ICD-10-CM

## 2017-02-22 NOTE — Telephone Encounter (Signed)
Lab order for Tb Gold faxed to GP's office.

## 2017-02-22 NOTE — Telephone Encounter (Signed)
Patients daughter calling stating they have chosen to have TB test redone at GPs office. Please fax order to [facility] fax# [phone]. Claris Gladden, NP

## 2017-02-24 ENCOUNTER — Telehealth: Payer: Self-pay | Admitting: Rheumatology

## 2017-02-24 NOTE — Telephone Encounter (Signed)
Patients daughter called stating patients GPs office did not receive TB Gold order that was faxed on Monday. Daughter request for order to be faxed again. Fax # [phone]

## 2017-02-25 NOTE — Telephone Encounter (Signed)
Patient's daughter advised re-faxed order.

## 2017-03-15 DIAGNOSIS — M05762 Rheumatoid arthritis with rheumatoid factor of left knee without organ or systems involvement: Secondary | ICD-10-CM

## 2017-03-15 DIAGNOSIS — M05742 Rheumatoid arthritis with rheumatoid factor of left hand without organ or systems involvement: Secondary | ICD-10-CM

## 2017-03-15 DIAGNOSIS — M05772 Rheumatoid arthritis with rheumatoid factor of left ankle and foot without organ or systems involvement: Secondary | ICD-10-CM

## 2017-03-15 DIAGNOSIS — M05771 Rheumatoid arthritis with rheumatoid factor of right ankle and foot without organ or systems involvement: Secondary | ICD-10-CM | POA: Insufficient documentation

## 2017-03-15 DIAGNOSIS — M05741 Rheumatoid arthritis with rheumatoid factor of right hand without organ or systems involvement: Secondary | ICD-10-CM | POA: Insufficient documentation

## 2017-03-15 DIAGNOSIS — M05761 Rheumatoid arthritis with rheumatoid factor of right knee without organ or systems involvement: Secondary | ICD-10-CM | POA: Insufficient documentation

## 2017-03-15 DIAGNOSIS — M0579 Rheumatoid arthritis with rheumatoid factor of multiple sites without organ or systems involvement: Secondary | ICD-10-CM | POA: Insufficient documentation

## 2017-03-15 DIAGNOSIS — Z8781 Personal history of (healed) traumatic fracture: Secondary | ICD-10-CM | POA: Insufficient documentation

## 2017-03-15 NOTE — Progress Notes (Signed)
Office Visit Note  Patient: Zoe Robinson             Date of Birth: [birth year]           MRN: [id number]             PCP: Gennette Pac, NP Referring: [name], MD Visit Date: 03/17/2017 Occupation: _0 @    Subjective:  Pain in joints.   History of Present Illness: Zoe Robinson is a 83 y.o. female  with sero positive rheumatoid arthritis. According to her she continues to have some discomfort in her joints. The pain is mostly in her hands and knee joints. He could not start her on methotrexate as she had indeterminate TB gold.  Activities of Daily Living:  Patient reports morning stiffness for 0 minute.   Patient Denies nocturnal pain.  Difficulty dressing/grooming: Denies Difficulty climbing stairs: Reports Difficulty getting out of chair: Reports Difficulty using hands for taps, buttons, cutlery, and/or writing: Reports   Review of Systems  Constitutional: Negative for fatigue, night sweats, weight gain, weight loss and weakness.  HENT: Positive for mouth dryness. Negative for mouth sores, trouble swallowing, trouble swallowing and nose dryness.   Eyes: Negative for pain, redness, visual disturbance and dryness.  Respiratory: Negative for cough, shortness of breath and difficulty breathing.   Cardiovascular: Negative for chest pain, palpitations, hypertension, irregular heartbeat and swelling in legs/feet.  Gastrointestinal: Negative for blood in stool, constipation and diarrhea.  Endocrine: Negative for increased urination.  Genitourinary: Negative for vaginal dryness.  Musculoskeletal: Positive for arthralgias, joint pain, joint swelling, myalgias and myalgias. Negative for muscle weakness, morning stiffness and muscle tenderness.  Skin: Negative for color change, rash, hair loss, skin tightness, ulcers and sensitivity to sunlight.  Allergic/Immunologic: Negative for susceptible to infections.  Neurological: Negative for dizziness, memory loss and  night sweats.  Hematological: Negative for swollen glands.  Psychiatric/Behavioral: Negative for depressed mood and sleep disturbance. The patient is not nervous/anxious.     PMFS History:  Patient Active Problem List   Diagnosis Date Noted  . Rheumatoid arthritis involving multiple sites with positive rheumatoid factor ([place]) 03/15/2017  . Rheumatoid arthritis involving both knees with positive rheumatoid factor ([place]) 03/15/2017  . Rheumatoid arthritis involving both hands with positive rheumatoid factor ([place]) 03/15/2017  . Rheumatoid arthritis involving both feet with positive rheumatoid factor ([place]) 03/15/2017  . History of vertebral fracture 03/15/2017  . On prednisone therapy 02/10/2017  . Age-related osteoporosis without current pathological fracture 02/10/2017  . History of pulmonary hypertension 02/10/2017  . History of atrial fibrillation 02/10/2017  . History of anemia 02/10/2017  . History of recurrent UTIs 02/10/2017  . History of hypercalcemia 02/10/2017  . On anticoagulant therapy 02/10/2017  . Acute encephalopathy 08/19/2014  . Neutropenia ([place]) 04/20/2014  . Fever, unspecified 04/20/2014  . Unspecified constipation 04/20/2014  . Hypercalcemia 04/15/2014  . Acute renal failure ([place]) 04/15/2014  . Generalized weakness 04/15/2014  . Dehydration 04/15/2014  . UTI (urinary tract infection) 04/15/2014  . Iron deficiency anemia 04/15/2014  . CKD (chronic kidney disease)   . Hypertension   . Atrial fibrillation [facility])     Past Medical History:  Diagnosis Date  . Arthritis   . Atrial fibrillation ([place])   . CKD (chronic kidney disease)   . Hypertension   . RA (rheumatoid arthritis) (HCC)     Family History  Problem Relation Age of Onset  . Heart attack Mother   . Heart attack Father   . Heart attack Brother   .  Heart attack Brother   . Stomach cancer Brother    Past Surgical History:  Procedure Laterality Date  . ABDOMINAL HYSTERECTOMY     Social History    Social History Narrative  . No narrative on file     Objective: Vital Signs: BP 122/60   Pulse 60 Comment: irregular  Resp 14   Ht 4' 11" (1.499 m)   Wt 118 lb (53.5 kg)   BMI 23.83 kg/m    Physical Exam  Constitutional: She is oriented to person, place, and time. She appears well-developed and well-nourished.  HENT:  Head: Normocephalic and atraumatic.  Eyes: Conjunctivae and EOM are normal.  Neck: Normal range of motion.  Cardiovascular: Normal rate, regular rhythm, normal heart sounds and intact distal pulses.   Pulmonary/Chest: Effort normal and breath sounds normal.  Abdominal: Soft. Bowel sounds are normal.  Lymphadenopathy:    She has no cervical adenopathy.  Neurological: She is alert and oriented to person, place, and time.  Skin: Skin is warm and dry. Capillary refill takes less than 2 seconds.  Psychiatric: She has a normal mood and affect. Her behavior is normal.  Nursing note and vitals reviewed.    Musculoskeletal Exam: C-spine and thoracic lumbar spine good range of motion. Shoulder joints although joints are good range of motion. She has limitation of range of motion of her wrist joints. She synovial thickening and synovitis over bilateral MCP joints as described below. Hip joints are good range of motion. She has limited extension of bilateral knee joints with warmth swelling and effusion.  CDAI Exam: CDAI Homunculus Exam:   Tenderness:  RUE: wrist LUE: wrist Right hand: 2nd MCP, 3rd MCP, 4th MCP and 5th MCP Left hand: 2nd MCP and 3rd MCP RLE: tibiofemoral LLE: tibiofemoral  Swelling:  Right hand: 2nd MCP and 5th MCP Left hand: 2nd MCP RLE: tibiofemoral LLE: tibiofemoral  Joint Counts:  CDAI Tender Joint count: 10 CDAI Swollen Joint count: 5  Global Assessments:  Patient Global Assessment: 4 Provider Global Assessment: 4  CDAI Calculated Score: 23    Investigation: Findings:  Labs from [facility] 02/10/2017 CBC WBC 8.6 hemoglobin 10.8  platelets 369, CMP normal GFR more than 60  02/14/2017 CK 50, TSH normal, UA negative, SPEP consistent with inflammation, immunoglobulins normal, TB Gold indeterminate, hep panel negative, HIV negative, G6PD 22.8 high ESR 51, ANA 1:160 NH, RF 91, anti-CCP> to 50  Imaging: No results found.  Speciality Comments: No specialty comments available.    Procedures:  No procedures performed Allergies: Patient has no known allergies.   Assessment / Plan:     Visit Diagnoses: Rheumatoid arthritis involving multiple sites with positive rheumatoid factor (HCC) - Positive RF, positive anti-CCP, positive ANA, high ESR. She has ongoing synovitis in her hands and also knee joint warmth swelling and effusion in her bilateral knee joints. We had detailed discussion regarding different treatment options in the past. The plan is to start her on methotrexate 3 tablets by mouth every week after her lab results. With folic acid 1 mg by mouth daily. We will check labs in 2 weeks and if the labs are stable and we will increase her methotrexate to 5 tablets by mouth every week. I'll keep her on 5 tablets by mouth every week after that.  High risk medication use - Treated with methotrexate in the past. Last 2 TB gold  indeterminate. She needs PPD. Prescription was given for PPD which can be done at her PCPs office. Once we have  PPD results available we can call and methotrexate.  On prednisone therapy: She takes 5-10 mg of prednisone every day. Which was prescribed by Dr. Truslow.  Rheumatoid arthritis involving both knees with positive rheumatoid factor (HCC): She has severe arthritis involving bilateral knee joints.  Rheumatoid arthritis involving both hands with positive rheumatoid factor (HCC) she has ongoing synovitis and synovial thickening in her bilateral hands.  Rheumatoid arthritis involving both feet with positive rheumatoid factor (HCC): Proper fitting shoes were discussed.  Acute cystitis without  hematuria - Recurrent. Patient was referred to urology. She states she has been treated for UTI by her PCP and she prefers not to see urologist at this time.  Age-related osteoporosis without current pathological fracture: Patient has been started on Prolia by her PCP.  History of vertebral fracture  History of atrial fibrillation  History of pulmonary hypertension  On anticoagulant therapy  Hypercalcemia    Orders: No orders of the defined types were placed in this encounter.  No orders of the defined types were placed in this encounter.   Face-to-face time spent with patient was 30 minutes. 50% of time was spent in counseling and coordination of care.  Follow-Up Instructions: Return for Rheumatoid arthritis.    , MD  Note - This record has been created using Dragon software.  Chart creation errors have been sought, but may not always  have been located. Such creation errors do not reflect on  the standard of medical care. 

## 2017-03-17 ENCOUNTER — Encounter: Payer: Self-pay | Admitting: Rheumatology

## 2017-03-17 ENCOUNTER — Ambulatory Visit: Payer: Self-pay | Admitting: Rheumatology

## 2017-03-17 ENCOUNTER — Ambulatory Visit (INDEPENDENT_AMBULATORY_CARE_PROVIDER_SITE_OTHER): Payer: Medicare Other | Admitting: Rheumatology

## 2017-03-17 VITALS — BP 122/60 | HR 60 | Resp 14 | Ht 59.0 in | Wt 118.0 lb

## 2017-03-17 DIAGNOSIS — M05772 Rheumatoid arthritis with rheumatoid factor of left ankle and foot without organ or systems involvement: Secondary | ICD-10-CM

## 2017-03-17 DIAGNOSIS — M05761 Rheumatoid arthritis with rheumatoid factor of right knee without organ or systems involvement: Secondary | ICD-10-CM | POA: Diagnosis not present

## 2017-03-17 DIAGNOSIS — M05741 Rheumatoid arthritis with rheumatoid factor of right hand without organ or systems involvement: Secondary | ICD-10-CM

## 2017-03-17 DIAGNOSIS — M05771 Rheumatoid arthritis with rheumatoid factor of right ankle and foot without organ or systems involvement: Secondary | ICD-10-CM | POA: Diagnosis not present

## 2017-03-17 DIAGNOSIS — Z8781 Personal history of (healed) traumatic fracture: Secondary | ICD-10-CM

## 2017-03-17 DIAGNOSIS — Z7952 Long term (current) use of systemic steroids: Secondary | ICD-10-CM

## 2017-03-17 DIAGNOSIS — N3000 Acute cystitis without hematuria: Secondary | ICD-10-CM | POA: Diagnosis not present

## 2017-03-17 DIAGNOSIS — M05762 Rheumatoid arthritis with rheumatoid factor of left knee without organ or systems involvement: Secondary | ICD-10-CM

## 2017-03-17 DIAGNOSIS — Z8679 Personal history of other diseases of the circulatory system: Secondary | ICD-10-CM | POA: Diagnosis not present

## 2017-03-17 DIAGNOSIS — M810 Age-related osteoporosis without current pathological fracture: Secondary | ICD-10-CM

## 2017-03-17 DIAGNOSIS — Z7901 Long term (current) use of anticoagulants: Secondary | ICD-10-CM | POA: Diagnosis not present

## 2017-03-17 DIAGNOSIS — Z79899 Other long term (current) drug therapy: Secondary | ICD-10-CM | POA: Diagnosis not present

## 2017-03-17 DIAGNOSIS — M05742 Rheumatoid arthritis with rheumatoid factor of left hand without organ or systems involvement: Secondary | ICD-10-CM

## 2017-03-17 DIAGNOSIS — M0579 Rheumatoid arthritis with rheumatoid factor of multiple sites without organ or systems involvement: Secondary | ICD-10-CM

## 2017-03-17 NOTE — Patient Instructions (Addendum)
Go for your Skin Test with your primary care, have them send a report, once we have a negative report, plan is to start the Methotrexate  When you start Methotrexate you will need labs in 2 weeks, then again 2 weeks later, then every 2 months to check your kidney/ liver functions and your blood counts.   Please call us when your doctor sends over the negative TB results.    Standing Labs We placed an order today for your standing lab work.    Please come back and get your standing labs 2 weeks after you start the Methotrexate   We have open lab Monday through Friday from 8:30-11:30 AM and 1:30-4 PM at the office of Dr. Pollyann Savoy.   The office is located at [address], [address], [place], [place] [address] No appointment is necessary.   Labs are drawn by [facility].  You may receive a bill from [facility] for your lab work. If you have any questions regarding directions or hours of operation,  please call [phone].

## 2017-03-25 ENCOUNTER — Encounter: Payer: Self-pay | Admitting: *Deleted

## 2017-03-25 DIAGNOSIS — Z79899 Other long term (current) drug therapy: Secondary | ICD-10-CM | POA: Insufficient documentation

## 2017-04-07 LAB — TB SKIN TEST
Induration: 0 mm
TB Skin Test: NEGATIVE

## 2017-06-14 NOTE — Progress Notes (Signed)
Office Visit Note  Patient: Zoe Robinson             Date of Birth: 1932/03/10           MRN: [id number]             PCP: Gennette Pac, NP Referring: Gennette Pac, NP Visit Date: 06/23/2017 Occupation: _0 @    Subjective:  Medication management.   History of Present Illness: Zoe Robinson is a 83 y.o. female with history of sero positive rheumatoid arthritis. She's been on prednisone 5 mg alternating with 10 mg every other day. She was off methotrexate as her TB gold was indeterminate. She had recent PPD in August 2018 which was negative. She's way to start on methotrexate. She denies any joint pain or joint swelling on prednisone.  Activities of Daily Living:  Patient reports morning stiffness for 5 minutes.   Patient Reports nocturnal pain.  Difficulty dressing/grooming: Denies Difficulty climbing stairs: Reports Difficulty getting out of chair: Reports Difficulty using hands for taps, buttons, cutlery, and/or writing: Reports   Review of Systems  Constitutional: Negative for fatigue, night sweats, weight gain, weight loss and weakness.  HENT: Positive for mouth dryness. Negative for mouth sores, trouble swallowing, trouble swallowing and nose dryness.   Eyes: Negative for pain, redness, visual disturbance and dryness.  Respiratory: Negative for cough, shortness of breath and difficulty breathing.   Cardiovascular: Negative for chest pain, palpitations, hypertension, irregular heartbeat and swelling in legs/feet.  Gastrointestinal: Negative for blood in stool, constipation and diarrhea.  Endocrine: Negative for increased urination.  Genitourinary: Negative for vaginal dryness.  Musculoskeletal: Positive for arthralgias, joint pain and morning stiffness. Negative for joint swelling, myalgias, muscle weakness, muscle tenderness and myalgias.  Skin: Negative for color change, rash, hair loss, skin tightness, ulcers and sensitivity to sunlight.    Allergic/Immunologic: Negative for susceptible to infections.  Neurological: Negative for dizziness, memory loss and night sweats.  Hematological: Negative for swollen glands.  Psychiatric/Behavioral: Negative for depressed mood and sleep disturbance. The patient is not nervous/anxious.     PMFS History:  Patient Active Problem List   Diagnosis Date Noted  . High risk medication use 03/25/2017  . Rheumatoid arthritis involving multiple sites with positive rheumatoid factor ([place]) 03/15/2017  . Rheumatoid arthritis involving both knees with positive rheumatoid factor ([place]) 03/15/2017  . Rheumatoid arthritis involving both hands with positive rheumatoid factor ([place]) 03/15/2017  . Rheumatoid arthritis involving both feet with positive rheumatoid factor ([place]) 03/15/2017  . History of vertebral fracture 03/15/2017  . On prednisone therapy 02/10/2017  . Age-related osteoporosis without current pathological fracture 02/10/2017  . History of pulmonary hypertension 02/10/2017  . History of atrial fibrillation 02/10/2017  . History of anemia 02/10/2017  . History of recurrent UTIs 02/10/2017  . History of hypercalcemia 02/10/2017  . On anticoagulant therapy 02/10/2017  . Acute encephalopathy 08/19/2014  . Neutropenia ([place]) 04/20/2014  . Fever, unspecified 04/20/2014  . Unspecified constipation 04/20/2014  . Hypercalcemia 04/15/2014  . Acute renal failure ([place]) 04/15/2014  . Generalized weakness 04/15/2014  . Dehydration 04/15/2014  . UTI (urinary tract infection) 04/15/2014  . Iron deficiency anemia 04/15/2014  . CKD (chronic kidney disease)   . Hypertension   . Atrial fibrillation [facility])     Past Medical History:  Diagnosis Date  . Arthritis   . Atrial fibrillation ([place])   . CKD (chronic kidney disease)   . Hypertension   . RA (rheumatoid arthritis) ([place])     Family History  Problem Relation Age of Onset  . Heart attack Mother   . Heart attack Father   . Heart attack Brother    . Heart attack Brother   . Stomach cancer Brother   . Cancer Sister   . Dementia Sister   . Osteoarthritis Daughter   . Depression Daughter   . Hypothyroidism Daughter   . Hypercholesterolemia Daughter   . Heart disease Son   . Hypertension Son   . Gout Son    Past Surgical History:  Procedure Laterality Date  . ABDOMINAL HYSTERECTOMY     Social History   Social History Narrative  . Not on file     Objective: Vital Signs: BP 120/72 (BP Location: Left Arm, Patient Position: Sitting, Cuff Size: Normal)   Pulse 65   Resp 14   Ht 4' 11" (1.499 m)   Wt 121 lb (54.9 kg)   BMI 24.44 kg/m    Physical Exam  Constitutional: She is oriented to person, place, and time. She appears well-developed and well-nourished.  HENT:  Head: Normocephalic and atraumatic.  Eyes: Conjunctivae and EOM are normal.  Neck: Normal range of motion.  Cardiovascular: Normal rate, regular rhythm, normal heart sounds and intact distal pulses.  Pulmonary/Chest: Effort normal and breath sounds normal.  Abdominal: Soft. Bowel sounds are normal.  Lymphadenopathy:    She has no cervical adenopathy.  Neurological: She is alert and oriented to person, place, and time.  Skin: Skin is warm and dry. Capillary refill takes less than 2 seconds.  Psychiatric: She has a normal mood and affect. Her behavior is normal.  Nursing note and vitals reviewed.    Musculoskeletal Exam: C-spine and thoracic lumbar spine limited range of motion. She has some discomfort there is a motion of her shoulder joints. Elbow joints are good range of motion. She has limitation of range of motion of bilateral wrist joints with synovial thickening but no synovitis. She synovial thickening over bilateral MCP joints with ulnar dictation. She has synovitis over left second MCP joint. She has thickening of PIP/DIP joints bilaterally. Hip joints, knee joints are good range of motion. She is some warmth on palpation of her knee joints.  CDAI  Exam: CDAI Homunculus Exam:   Tenderness:  RUE: glenohumeral LUE: glenohumeral Left hand: 2nd MCP RLE: tibiofemoral LLE: tibiofemoral  Swelling:  Left hand: 2nd MCP  Joint Counts:  CDAI Tender Joint count: 5 CDAI Swollen Joint count: 1  Global Assessments:  Patient Global Assessment: 7 Provider Global Assessment: 7  CDAI Calculated Score: 20    Investigation: No additional findings. PPD 02/2017 negative CBC Latest Ref Rng & Units 05/21/2015 11/12/2014 08/20/2014  WBC 3.9 - 10.3 10e3/uL 11.0(H) 10.0 11.1(H)  Hemoglobin 11.6 - 15.9 g/dL 10.7(L) 10.3(L) 9.8(L)  Hematocrit 34.8 - 46.6 % 32.7(L) 31.0(L) 30.3(L)  Platelets 145 - 400 10e3/uL 435(H) 319 375   CMP Latest Ref Rng & Units 08/20/2014 08/19/2014 08/19/2014  Glucose 70 - 99 mg/dL 130(H) 131(H) 150(H)  BUN 6 - 23 mg/dL 24(H) 22 25(H)  Creatinine 0.50 - 1.10 mg/dL 1.02 1.06 1.18(H)  Sodium 135 - 145 mmol/L 140 140 136  Potassium 3.5 - 5.1 mmol/L 3.7 4.1 4.3  Chloride 96 - 112 mmol/L 107 100 101  CO2 19 - 32 mmol/L 24 32 30  Calcium 8.4 - 10.5 mg/dL 8.2(L) 9.0 8.9  Total Protein 6.0 - 8.3 g/dL - - 6.8  Total Bilirubin 0.3 - 1.2 mg/dL - - 0.5  Alkaline Phos 39 - 117 U/L - -  65  AST 0 - 37 U/L - - 30  ALT 0 - 35 U/L - - 24    Imaging: No results found.  Speciality Comments: No specialty comments available.    Procedures:  No procedures performed Allergies: Patient has no known allergies.   Assessment / Plan:     Visit Diagnoses: Rheumatoid arthritis involving multiple sites with positive rheumatoid factor (HCC) - Positive RF, positive anti-CCP, positive ANA, high ESR. Patient has been on prednisone monotherapy for a while due to indeterminate TB gold. She had recent PPD which was negative in August. She's been taking prednisone 5 mg alternating with 10 mg which is controlling her symptoms. She was treated with methotrexate in the past by Dr. Charlestine Night. Indications side effects contraindications of methotrexate were  discussed at length. Consent was obtained. We do not have any baseline labs on her. We will check CBC and CMP today. If it's normal and will start from 3 tablets of methotrexate per week. And increase it to 5 tablets by mouth every week if her labs are stable in 2 weeks. Labs will be checked every 2 weeks 2 and then every 2 months to monitor for drug toxicity. She will also need folic acid 1 mg by mouth daily.  Drug Counseling TB Gold: Indeterminate 2, PPD negative Hepatitis panel: Negative  Chest-xray:  2016  Contraception: Not indicated  Alcohol use: Discussed  Patient was counseled on the purpose, proper use, and adverse effects of methotrexate including nausea, infection, and signs and symptoms of pneumonitis.  Reviewed instructions with patient to take methotrexate weekly along with folic acid daily.  Discussed the importance of frequent monitoring of kidney and liver function and blood counts, and provided patient with standing lab instructions.  Counseled patient to avoid NSAIDs and alcohol while on methotrexate.  Provided patient with educational materials on methotrexate and answered all questions.  Advised patient to get annual influenza vaccine and to get a pneumococcal vaccine if patient has not already had one.  Patient voiced understanding.  Patient consented to methotrexate use.  Will upload into chart.    High risk medication use - Treated with methotrexate in the past. Last two TB gold  indeterminate. - Plan: CBC with Differential/Platelet, COMPLETE METABOLIC PANEL WITH GFR, CBC with Differential/Platelet, COMPLETE METABOLIC PANEL WITH GFR  On prednisone therapy - She takes 5 mg /10 mg of prednisone every other day. Which was prescribed by Dr. Charlestine Night. To taper her prednisone was methotrexate gets into her system.  Rheumatoid arthritis involving both knees with positive rheumatoid factor [facility]): Chronic pain  Rheumatoid arthritis involving both hands with positive rheumatoid  factor (HCC) - she has ongoing synovitis and synovial thickening in her bilateral hands.  Rheumatoid arthritis involving both feet with positive rheumatoid factor (HCC)  Age-related osteoporosis without current pathological fracture - Patient has been started on Prolia by her PCP.  Acute cystitis without hematuria - Recurrent. Patient was referred to urology. She states she has been treated for UTI by her PCP and she prefers not to see urologist at this time.  Other medical problems are listed as follows:  History of pulmonary hypertension  Hypercalcemia  History of vertebral fracture  History of atrial fibrillation  On anticoagulant therapy    Orders: Orders Placed This Encounter  Procedures  . CBC with Differential/Platelet  . COMPLETE METABOLIC PANEL WITH GFR  . CBC with Differential/Platelet  . COMPLETE METABOLIC PANEL WITH GFR   No orders of the defined types were placed in  this encounter.   Face-to-face time spent with patient was 30 minutes. Greater than 50% of time was spent in counseling and coordination of care.  Follow-Up Instructions: Return in about 3 months (around 09/22/2017) for Rheumatoid arthritis.   Bo Merino, MD  Note - This record has been created using Editor, commissioning.  Chart creation errors have been sought, but may not always  have been located. Such creation errors do not reflect on  the standard of medical care.

## 2017-06-23 ENCOUNTER — Encounter: Payer: Self-pay | Admitting: Rheumatology

## 2017-06-23 ENCOUNTER — Ambulatory Visit: Payer: Medicare Other | Admitting: Rheumatology

## 2017-06-23 VITALS — BP 120/72 | HR 65 | Resp 14 | Ht 59.0 in | Wt 121.0 lb

## 2017-06-23 DIAGNOSIS — Z8679 Personal history of other diseases of the circulatory system: Secondary | ICD-10-CM

## 2017-06-23 DIAGNOSIS — N3000 Acute cystitis without hematuria: Secondary | ICD-10-CM | POA: Diagnosis not present

## 2017-06-23 DIAGNOSIS — Z7952 Long term (current) use of systemic steroids: Secondary | ICD-10-CM | POA: Diagnosis not present

## 2017-06-23 DIAGNOSIS — Z79899 Other long term (current) drug therapy: Secondary | ICD-10-CM

## 2017-06-23 DIAGNOSIS — Z7901 Long term (current) use of anticoagulants: Secondary | ICD-10-CM | POA: Diagnosis not present

## 2017-06-23 DIAGNOSIS — M05741 Rheumatoid arthritis with rheumatoid factor of right hand without organ or systems involvement: Secondary | ICD-10-CM

## 2017-06-23 DIAGNOSIS — M05761 Rheumatoid arthritis with rheumatoid factor of right knee without organ or systems involvement: Secondary | ICD-10-CM

## 2017-06-23 DIAGNOSIS — M05762 Rheumatoid arthritis with rheumatoid factor of left knee without organ or systems involvement: Secondary | ICD-10-CM

## 2017-06-23 DIAGNOSIS — M0579 Rheumatoid arthritis with rheumatoid factor of multiple sites without organ or systems involvement: Secondary | ICD-10-CM | POA: Diagnosis not present

## 2017-06-23 DIAGNOSIS — M05742 Rheumatoid arthritis with rheumatoid factor of left hand without organ or systems involvement: Secondary | ICD-10-CM

## 2017-06-23 DIAGNOSIS — Z8781 Personal history of (healed) traumatic fracture: Secondary | ICD-10-CM | POA: Diagnosis not present

## 2017-06-23 DIAGNOSIS — M810 Age-related osteoporosis without current pathological fracture: Secondary | ICD-10-CM

## 2017-06-23 DIAGNOSIS — M05771 Rheumatoid arthritis with rheumatoid factor of right ankle and foot without organ or systems involvement: Secondary | ICD-10-CM | POA: Diagnosis not present

## 2017-06-23 DIAGNOSIS — M05772 Rheumatoid arthritis with rheumatoid factor of left ankle and foot without organ or systems involvement: Secondary | ICD-10-CM

## 2017-06-23 LAB — CBC WITH DIFFERENTIAL/PLATELET
Basophils Absolute: 40 cells/uL (ref 0–200)
Basophils Relative: 0.4 %
EOS ABS: 40 {cells}/uL (ref 15–500)
Eosinophils Relative: 0.4 %
HCT: 29.9 % — ABNORMAL LOW (ref 35.0–45.0)
Hemoglobin: 10.1 g/dL — ABNORMAL LOW (ref 11.7–15.5)
Lymphs Abs: 1287 cells/uL (ref 850–3900)
MCH: 28.3 pg (ref 27.0–33.0)
MCHC: 33.8 g/dL (ref 32.0–36.0)
MCV: 83.8 fL (ref 80.0–100.0)
MPV: 8.9 fL (ref 7.5–12.5)
Monocytes Relative: 6.5 %
NEUTROS PCT: 79.7 %
Neutro Abs: 7890 cells/uL — ABNORMAL HIGH (ref 1500–7800)
PLATELETS: 421 10*3/uL — AB (ref 140–400)
RBC: 3.57 10*6/uL — ABNORMAL LOW (ref 3.80–5.10)
RDW: 12.8 % (ref 11.0–15.0)
TOTAL LYMPHOCYTE: 13 %
WBC: 9.9 10*3/uL (ref 3.8–10.8)
WBCMIX: 644 {cells}/uL (ref 200–950)

## 2017-06-23 LAB — COMPLETE METABOLIC PANEL WITH GFR
AG RATIO: 1 (calc) (ref 1.0–2.5)
ALKALINE PHOSPHATASE (APISO): 42 U/L (ref 33–130)
ALT: 12 U/L (ref 6–29)
AST: 18 U/L (ref 10–35)
Albumin: 3.6 g/dL (ref 3.6–5.1)
BILIRUBIN TOTAL: 0.3 mg/dL (ref 0.2–1.2)
BUN/Creatinine Ratio: 26 (calc) — ABNORMAL HIGH (ref 6–22)
BUN: 24 mg/dL (ref 7–25)
CHLORIDE: 97 mmol/L — AB (ref 98–110)
CO2: 30 mmol/L (ref 20–32)
Calcium: 9.7 mg/dL (ref 8.6–10.4)
Creat: 0.92 mg/dL — ABNORMAL HIGH (ref 0.60–0.88)
GFR, EST AFRICAN AMERICAN: 67 mL/min/{1.73_m2} (ref >=60)
GFR, Est Non African American: 58 mL/min/{1.73_m2} — ABNORMAL LOW (ref >=60)
Globulin: 3.5 g/dL (calc) (ref 1.9–3.7)
Glucose, Bld: 103 mg/dL — ABNORMAL HIGH (ref 65–99)
POTASSIUM: 5.1 mmol/L (ref 3.5–5.3)
Sodium: 134 mmol/L — ABNORMAL LOW (ref 135–146)
Total Protein: 7.1 g/dL (ref 6.1–8.1)

## 2017-06-23 NOTE — Patient Instructions (Addendum)
Standing Labs We placed an order today for your standing lab work.    Please come back and get your standing labs in 2 weeks after starting methotrexate 2 and then every 2 monthsMethotrexate tablets What is this medicine? METHOTREXATE (METH oh TREX ate) is a chemotherapy drug used to treat cancer including breast cancer, leukemia, and lymphoma. This medicine can also be used to treat psoriasis and certain kinds of arthritis. This medicine may be used for other purposes; ask your health care provider or pharmacist if you have questions. COMMON BRAND NAME(S): Rheumatrex, Trexall What should I tell my health care provider before I take this medicine? They need to know if you have any of these conditions: -fluid in the stomach area or lungs -if you often drink alcohol -infection or immune system problems -kidney disease or on hemodialysis -liver disease -low blood counts, like low white cell, platelet, or red cell counts -lung disease -radiation therapy -stomach ulcers -ulcerative colitis -an unusual or allergic reaction to methotrexate, other medicines, foods, dyes, or preservatives -pregnant or trying to get pregnant -breast-feeding How should I use this medicine? Take this medicine by mouth with a glass of water. Follow the directions on the prescription label. Take your medicine at regular intervals. Do not take it more often than directed. Do not stop taking except on your doctor's advice. Make sure you know why you are taking this medicine and how often you should take it. If this medicine is used for a condition that is not cancer, like arthritis or psoriasis, it should be taken weekly, NOT daily. Taking this medicine more often than directed can cause serious side effects, even death. Talk to your healthcare provider about safe handling and disposal of this medicine. You may need to take special precautions. Talk to your pediatrician regarding the use of this medicine in children.  While this drug may be prescribed for selected conditions, precautions do apply. Overdosage: If you think you have taken too much of this medicine contact a poison control center or emergency room at once. NOTE: This medicine is only for you. Do not share this medicine with others. What if I miss a dose? If you miss a dose, talk with your doctor or health care professional. Do not take double or extra doses. What may interact with this medicine? This medicine may interact with the following medication: -acitretin -aspirin and aspirin-like medicines including salicylates -azathioprine -certain antibiotics like penicillins, tetracycline, and chloramphenicol -cyclosporine -gold -hydroxychloroquine -live virus vaccines -NSAIDs, medicines for pain and inflammation, like ibuprofen or naproxen -other cytotoxic agents -penicillamine -phenylbutazone -phenytoin -probenecid -retinoids such as isotretinoin and tretinoin -steroid medicines like prednisone or cortisone -sulfonamides like sulfasalazine and trimethoprim/sulfamethoxazole -theophylline This list may not describe all possible interactions. Give your health care provider a list of all the medicines, herbs, non-prescription drugs, or dietary supplements you use. Also tell them if you smoke, drink alcohol, or use illegal drugs. Some items may interact with your medicine. What should I watch for while using this medicine? Avoid alcoholic drinks. This medicine can make you more sensitive to the sun. Keep out of the sun. If you cannot avoid being in the sun, wear protective clothing and use sunscreen. Do not use sun lamps or tanning beds/booths. You may need blood work done while you are taking this medicine. Call your doctor or health care professional for advice if you get a fever, chills or sore throat, or other symptoms of a cold or flu. Do not treat yourself. This  drug decreases your body's ability to fight infections. Try to avoid being  around people who are sick. This medicine may increase your risk to bruise or bleed. Call your doctor or health care professional if you notice any unusual bleeding. Check with your doctor or health care professional if you get an attack of severe diarrhea, nausea and vomiting, or if you sweat a lot. The loss of too much body fluid can make it dangerous for you to take this medicine. Talk to your doctor about your risk of cancer. You may be more at risk for certain types of cancers if you take this medicine. Both men and women must use effective birth control with this medicine. Do not become pregnant while taking this medicine or until at least 1 normal menstrual cycle has occurred after stopping it. Women should inform their doctor if they wish to become pregnant or think they might be pregnant. Men should not father a child while taking this medicine and for 3 months after stopping it. There is a potential for serious side effects to an unborn child. Talk to your health care professional or pharmacist for more information. Do not breast-feed an infant while taking this medicine. What side effects may I notice from receiving this medicine? Side effects that you should report to your doctor or health care professional as soon as possible: -allergic reactions like skin rash, itching or hives, swelling of the face, lips, or tongue -breathing problems or shortness of breath -diarrhea -dry, nonproductive cough -low blood counts - this medicine may decrease the number of white blood cells, red blood cells and platelets. You may be at increased risk for infections and bleeding. -mouth sores -redness, blistering, peeling or loosening of the skin, including inside the mouth -signs of infection - fever or chills, cough, sore throat, pain or trouble passing urine -signs and symptoms of bleeding such as bloody or black, tarry stools; red or dark-brown urine; spitting up blood or brown material that looks like  coffee grounds; red spots on the skin; unusual bruising or bleeding from the eye, gums, or nose -signs and symptoms of kidney injury like trouble passing urine or change in the amount of urine -signs and symptoms of liver injury like dark yellow or brown urine; general ill feeling or flu-like symptoms; light-colored stools; loss of appetite; nausea; right upper belly pain; unusually weak or tired; yellowing of the eyes or skin Side effects that usually do not require medical attention (report to your doctor or health care professional if they continue or are bothersome): -dizziness -hair loss -tiredness -upset stomach -vomiting This list may not describe all possible side effects. Call your doctor for medical advice about side effects. You may report side effects to FDA at 1-800-FDA-1088. Where should I keep my medicine? Keep out of the reach of children. Store at room temperature between 20 and 25 degrees C (68 and 77 degrees F). Protect from light. Throw away any unused medicine after the expiration date. NOTE: This sheet is a summary. It may not cover all possible information. If you have questions about this medicine, talk to your doctor, pharmacist, or health care provider.  2018 [employer] (2015-03-17 05:39:22)   We have open lab Monday through Friday from 8:30-11:30 AM and 1:30-4 PM at the office of Dr. Pollyann Savoy.   The office is located at [address], [address], [place], [place] [address] No appointment is necessary.   Labs are drawn by [facility].  You may receive a  bill from [place] for your lab work. If you have any questions regarding directions or hours of operation,  please call [phone].

## 2017-06-24 ENCOUNTER — Telehealth: Payer: Self-pay | Admitting: *Deleted

## 2017-06-24 MED ORDER — FOLIC ACID 1 MG PO TABS
1.0000 mg | ORAL_TABLET | ORAL | 0 refills | Status: DC
Start: 2017-06-24 — End: 2020-02-04

## 2017-06-24 MED ORDER — METHOTREXATE 2.5 MG PO TABS: ORAL_TABLET | ORAL | 0 refills | Status: DC

## 2017-06-24 NOTE — Progress Notes (Signed)
Anemia of chronic disease. GFR is 58. Okay to start on methotrexate 3 tablets by mouth every week. We will check labs in 2 weeks if is stable and should be able to increase it to 5 tablets by mouth every week. Please call and folic acid 1 mg by mouth daily

## 2017-06-24 NOTE — Telephone Encounter (Signed)
-----   Message from Pollyann Savoy, MD sent at 06/24/2017  8:55 AM EST ----- Anemia of chronic disease. GFR is 58. Okay to start on methotrexate 3 tablets by mouth every week. We will check labs in 2 weeks if is stable and should be able to increase it to 5 tablets by mouth every week. Please call and folic acid 1 mg by mouth  daily

## 2017-07-11 ENCOUNTER — Other Ambulatory Visit: Payer: Self-pay

## 2017-07-11 DIAGNOSIS — Z79899 Other long term (current) drug therapy: Secondary | ICD-10-CM

## 2017-07-12 LAB — CBC WITH DIFFERENTIAL/PLATELET
BASOS PCT: 0.3 %
Basophils Absolute: 27 cells/uL (ref 0–200)
EOS ABS: 9 {cells}/uL — AB (ref 15–500)
EOS PCT: 0.1 %
HCT: 29.3 % — ABNORMAL LOW (ref 35.0–45.0)
HEMOGLOBIN: 10 g/dL — AB (ref 11.7–15.5)
Lymphs Abs: 837 cells/uL — ABNORMAL LOW (ref 850–3900)
MCH: 28.7 pg (ref 27.0–33.0)
MCHC: 34.1 g/dL (ref 32.0–36.0)
MCV: 84 fL (ref 80.0–100.0)
MONOS PCT: 3.2 %
MPV: 9.2 fL (ref 7.5–12.5)
NEUTROS ABS: 7743 {cells}/uL (ref 1500–7800)
Neutrophils Relative %: 87 %
Platelets: 363 10*3/uL (ref 140–400)
RBC: 3.49 10*6/uL — AB (ref 3.80–5.10)
RDW: 13.3 % (ref 11.0–15.0)
Total Lymphocyte: 9.4 %
WBC mixed population: 285 cells/uL (ref 200–950)
WBC: 8.9 10*3/uL (ref 3.8–10.8)

## 2017-07-12 LAB — COMPLETE METABOLIC PANEL WITH GFR
AG RATIO: 1.1 (calc) (ref 1.0–2.5)
ALKALINE PHOSPHATASE (APISO): 37 U/L (ref 33–130)
ALT: 11 U/L (ref 6–29)
AST: 18 U/L (ref 10–35)
Albumin: 3.5 g/dL — ABNORMAL LOW (ref 3.6–5.1)
BUN/Creatinine Ratio: 31 (calc) — ABNORMAL HIGH (ref 6–22)
BUN: 26 mg/dL — ABNORMAL HIGH (ref 7–25)
CALCIUM: 9.3 mg/dL (ref 8.6–10.4)
CO2: 29 mmol/L (ref 20–32)
Chloride: 95 mmol/L — ABNORMAL LOW (ref 98–110)
Creat: 0.85 mg/dL (ref 0.60–0.88)
GFR, EST NON AFRICAN AMERICAN: 63 mL/min/{1.73_m2} (ref >=60)
GFR, Est African American: 73 mL/min/{1.73_m2} (ref >=60)
GLOBULIN: 3.3 g/dL (ref 1.9–3.7)
Glucose, Bld: 132 mg/dL — ABNORMAL HIGH (ref 65–99)
POTASSIUM: 4.7 mmol/L (ref 3.5–5.3)
SODIUM: 133 mmol/L — AB (ref 135–146)
Total Bilirubin: 0.3 mg/dL (ref 0.2–1.2)
Total Protein: 6.8 g/dL (ref 6.1–8.1)

## 2017-07-12 NOTE — Progress Notes (Signed)
Elevated glucose, chronic anemia. Labs are stable.

## 2017-07-20 ENCOUNTER — Other Ambulatory Visit: Payer: Self-pay | Admitting: Rheumatology

## 2017-07-20 NOTE — Telephone Encounter (Signed)
Last visit: 06/23/17 Next visit: 09/29/17 Labs: 07/11/17 Stable  Okay to refill per Dr. Corliss Skains

## 2017-08-01 ENCOUNTER — Other Ambulatory Visit: Payer: Self-pay

## 2017-08-01 DIAGNOSIS — Z79899 Other long term (current) drug therapy: Secondary | ICD-10-CM

## 2017-08-01 LAB — CBC WITH DIFFERENTIAL/PLATELET
BASOS ABS: 33 {cells}/uL (ref 0–200)
BASOS PCT: 0.4 %
EOS ABS: 107 {cells}/uL (ref 15–500)
EOS PCT: 1.3 %
HEMATOCRIT: 31.9 % — AB (ref 35.0–45.0)
Hemoglobin: 10.9 g/dL — ABNORMAL LOW (ref 11.7–15.5)
LYMPHS ABS: 1189 {cells}/uL (ref 850–3900)
MCH: 29.2 pg (ref 27.0–33.0)
MCHC: 34.2 g/dL (ref 32.0–36.0)
MCV: 85.5 fL (ref 80.0–100.0)
MONOS PCT: 11.2 %
MPV: 9.2 fL (ref 7.5–12.5)
Neutro Abs: 5953 cells/uL (ref 1500–7800)
Neutrophils Relative %: 72.6 %
Platelets: 370 10*3/uL (ref 140–400)
RBC: 3.73 10*6/uL — ABNORMAL LOW (ref 3.80–5.10)
RDW: 14.3 % (ref 11.0–15.0)
Total Lymphocyte: 14.5 %
WBC mixed population: 918 cells/uL (ref 200–950)
WBC: 8.2 10*3/uL (ref 3.8–10.8)

## 2017-08-01 LAB — COMPLETE METABOLIC PANEL WITH GFR
AG Ratio: 1.1 (calc) (ref 1.0–2.5)
ALT: 11 U/L (ref 6–29)
AST: 18 U/L (ref 10–35)
Albumin: 3.8 g/dL (ref 3.6–5.1)
Alkaline phosphatase (APISO): 40 U/L (ref 33–130)
BUN: 19 mg/dL (ref 7–25)
CALCIUM: 9.7 mg/dL (ref 8.6–10.4)
CO2: 29 mmol/L (ref 20–32)
Chloride: 96 mmol/L — ABNORMAL LOW (ref 98–110)
Creat: 0.87 mg/dL (ref 0.60–0.88)
GFR, EST AFRICAN AMERICAN: 71 mL/min/{1.73_m2} (ref >=60)
GFR, EST NON AFRICAN AMERICAN: 62 mL/min/{1.73_m2} (ref >=60)
GLUCOSE: 81 mg/dL (ref 65–99)
Globulin: 3.5 g/dL (calc) (ref 1.9–3.7)
POTASSIUM: 5 mmol/L (ref 3.5–5.3)
Sodium: 132 mmol/L — ABNORMAL LOW (ref 135–146)
TOTAL PROTEIN: 7.3 g/dL (ref 6.1–8.1)
Total Bilirubin: 0.4 mg/dL (ref 0.2–1.2)

## 2017-09-07 ENCOUNTER — Encounter: Payer: Medicare Other | Attending: Internal Medicine | Admitting: Internal Medicine

## 2017-09-07 DIAGNOSIS — I4891 Unspecified atrial fibrillation: Secondary | ICD-10-CM | POA: Insufficient documentation

## 2017-09-07 DIAGNOSIS — I87312 Chronic venous hypertension (idiopathic) with ulcer of left lower extremity: Secondary | ICD-10-CM | POA: Diagnosis not present

## 2017-09-07 DIAGNOSIS — L97223 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 muscle: Secondary | ICD-10-CM | POA: Insufficient documentation

## 2017-09-07 DIAGNOSIS — M059 Rheumatoid arthritis with rheumatoid factor, unspecified: Secondary | ICD-10-CM | POA: Insufficient documentation

## 2017-09-07 DIAGNOSIS — I10 Essential (primary) hypertension: Secondary | ICD-10-CM | POA: Diagnosis not present

## 2017-09-07 DIAGNOSIS — E1151 Type 2 diabetes mellitus with diabetic peripheral angiopathy without gangrene: Secondary | ICD-10-CM | POA: Diagnosis not present

## 2017-09-07 DIAGNOSIS — Z87891 Personal history of nicotine dependence: Secondary | ICD-10-CM | POA: Insufficient documentation

## 2017-09-08 NOTE — Progress Notes (Signed)
TERSA, SCHROFF ([id number]) Visit Report for 09/07/2017 Allergy List Details Patient Name: ZYANN, DRENNEN Date of Service: 09/07/2017 8:45 AM Medical Record Number: [id number] Patient Account Number: [id number] Date of Birth/Sex: [birth year] (85 y.o. Female) Treating RN: Curtis Sites Primary Care Kam Kushnir: Gabriel Cirri Other Clinician: Referring Quanta Roher: Gabriel Cirri Treating Keirstyn Aydt/Extender: Maxwell Caul Weeks in Treatment: 0 Allergies Active Allergies No Known Allergies Allergy Notes Electronic Signature(s) Signed: 09/07/2017 4:09:49 PM By: Curtis Sites Entered By: Curtis Sites on 09/07/2017 09:12:25 [name] ([id number]) -------------------------------------------------------------------------------- Arrival Information Details Patient Name: [name] Date of Service: 09/07/2017 8:45 AM Medical Record Number: [id number] Patient Account Number: [id number] Date of Birth/Sex: October 20, 1931 (85 y.o. Female) Treating RN: Curtis Sites Primary Care Beth Spackman: Gabriel Cirri Other Clinician: Referring Ravon Mortellaro: Gabriel Cirri Treating Marlee Armenteros/Extender: Altamese [place] in Treatment: 0 Visit Information Patient Arrived: Ambulatory Arrival Time: 09:07 Accompanied By: dtr Transfer Assistance: None Patient Identification Verified: Yes Secondary Verification Process Yes Completed: Patient Has Alerts: Yes Patient Alerts: Patient on Blood Thinner xarelto Electronic Signature(s) Signed: 09/07/2017 4:09:49 PM By: Curtis Sites Entered By: Curtis Sites on 09/07/2017 09:12:00 [name] ([id number]) -------------------------------------------------------------------------------- Clinic Level of Care Assessment Details Patient Name: [name] Date of Service: 09/07/2017 8:45 AM Medical Record Number: [id number] Patient Account Number: [id number] Date of Birth/Sex: 1932-05-16 (85 y.o. Female) Treating RN: Curtis Sites Primary Care Rahil Passey: Gabriel Cirri Other Clinician: Referring Myliyah Rebuck: Gabriel Cirri Treating Fergus Throne/Extender: Altamese  in Treatment: 0 Clinic Level of Care Assessment Items TOOL 1 Quantity Score []  - Use when EandM and Procedure is performed on INITIAL visit 0 ASSESSMENTS - Nursing Assessment / Reassessment X - General Physical Exam (combine w/ comprehensive assessment (listed just below) when 1 20 performed on new pt. evals) X- 1 25 Comprehensive Assessment (HX, ROS, Risk Assessments, Wounds Hx, etc.) ASSESSMENTS - Wound and Skin Assessment / Reassessment []  - Dermatologic / Skin Assessment (not related to wound area) 0 ASSESSMENTS - Ostomy and/or Continence Assessment and Care []  - Incontinence Assessment and Management 0 []  - 0 Ostomy Care Assessment and Management (repouching, etc.) PROCESS - Coordination of Care X - Simple Patient / Family Education for ongoing care 1 15 []  - 0 Complex (extensive) Patient / Family Education for ongoing care X- 1 10 Staff obtains Chiropractor, Records, Test Results / Process Orders []  - 0 Staff telephones HHA, Nursing Homes / Clarify orders / etc []  - 0 Routine Transfer to another Facility (non-emergent condition) []  - 0 Routine Hospital Admission (non-emergent condition) X- 1 15 New Admissions / Manufacturing engineer / Ordering NPWT, Apligraf, etc. []  - 0 Emergency Hospital Admission (emergent condition) PROCESS - Special Needs []  - Pediatric / Minor Patient Management 0 []  - 0 Isolation Patient Management []  - 0 Hearing / Language / Visual special needs []  - 0 Assessment of Community assistance (transportation, D/C planning, etc.) []  - 0 Additional assistance / Altered mentation []  - 0 Support Surface(s) Assessment (bed, cushion, seat, etc.) Alber, Iria ([id number]) INTERVENTIONS - Miscellaneous []  - External ear exam 0 []  - 0 Patient Transfer (multiple staff / Nurse, adult / Similar  devices) []  - 0 Simple Staple / Suture removal (25 or less) []  - 0 Complex Staple / Suture removal (26 or more) []  - 0 Hypo/Hyperglycemic Management (do not check if billed separately) X- 1 15 Ankle / Brachial Index (ABI) - do not check if billed separately Has the patient been seen at the hospital within the last three years: Yes Total Score: 100 Level Of Care: New/Established -  Level 3 Electronic Signature(s) Signed: 09/07/2017 4:09:49 PM By: Curtis Sites Entered By: Curtis Sites on 09/07/2017 10:19:00 [name] ([id number]) -------------------------------------------------------------------------------- Encounter Discharge Information Details Patient Name: [name] Date of Service: 09/07/2017 8:45 AM Medical Record Number: [id number] Patient Account Number: [id number] Date of Birth/Sex: Jun 27, 1932 (85 y.o. Female) Treating RN: Curtis Sites Primary Care Jaquasia Doscher: Gabriel Cirri Other Clinician: Referring Siena Poehler: Gabriel Cirri Treating Miaya Lafontant/Extender: Altamese [place] in Treatment: 0 Encounter Discharge Information Items Discharge Pain Level: 0 Discharge Condition: Stable Ambulatory Status: Ambulatory Discharge Destination: Home Transportation: Private Auto Accompanied By: dtr Schedule Follow-up Appointment: Yes Medication Reconciliation completed and No provided to Patient/Care Dezeray Puccio: Provided on Clinical Summary of Care: 09/07/2017 Form Type Recipient Paper Patient JK Electronic Signature(s) Signed: 09/07/2017 10:27:24 AM By: Curtis Sites Entered By: Curtis Sites on 09/07/2017 10:27:24 [name] ([id number]) -------------------------------------------------------------------------------- Lower Extremity Assessment Details Patient Name: [name] Date of Service: 09/07/2017 8:45 AM Medical Record Number: [id number] Patient Account Number: [id number] Date of Birth/Sex: Dec 10, 1931 (85 y.o. Female) Treating RN:  Curtis Sites Primary Care Marcello Tuzzolino: Gabriel Cirri Other Clinician: Referring Billee Balcerzak: Gabriel Cirri Treating Kikuye Korenek/Extender: Maxwell Caul Weeks in Treatment: 0 Edema Assessment Assessed: [Left: No] [Right: No] Edema: [Left: Yes] [Right: No] Calf Left: Right: Point of Measurement: 29 cm From Medial Instep 31.7 cm cm Ankle Left: Right: Point of Measurement: 10 cm From Medial Instep 18.5 cm cm Vascular Assessment Pulses: Dorsalis Pedis Palpable: [Left:Yes] [Right:Yes] Doppler Audible: [Left:Yes] [Right:Yes] Posterior Tibial Palpable: [Left:Yes] [Right:Yes] Doppler Audible: [Left:Yes] [Right:Yes] Extremity colors, hair growth, and conditions: Extremity Color: [Left:Hyperpigmented] [Right:Hyperpigmented] Hair Growth on Extremity: [Left:Yes] [Right:Yes] Temperature of Extremity: [Left:Warm] [Right:Warm] Capillary Refill: [Left:< 3 seconds] [Right:< 3 seconds] Blood Pressure: Brachial: [Left:172] Dorsalis Pedis: 186 [Left:Dorsalis Pedis:] Ankle: Posterior Tibial: 182 [Left:Posterior Tibial: 1.08] Toe Nail Assessment Left: Right: Thick: Yes Yes Discolored: Yes Yes Deformed: Yes Yes Improper Length and Hygiene: Yes Yes Electronic Signature(s) Signed: 09/07/2017 4:09:49 PM By: Curtis Sites Entered By: Curtis Sites on 09/07/2017 09:54:59 [name] ([id number]) [name] ([id number]) -------------------------------------------------------------------------------- Multi Wound Chart Details Patient Name: [name] Date of Service: 09/07/2017 8:45 AM Medical Record Number: [id number] Patient Account Number: [id number] Date of Birth/Sex: [birth year] (85 y.o. Female) Treating RN: Curtis Sites Primary Care Torian Quintero: Gabriel Cirri Other Clinician: Referring Eljay Lave: Gabriel Cirri Treating Mariaguadalupe Fialkowski/Extender: Maxwell Caul Weeks in Treatment: 0 Vital Signs Height(in): 59 Pulse(bpm): 79 Weight(lbs): 117 Blood Pressure(mmHg):  170/84 Body Mass Index(BMI): 24 Temperature(F): 97.9 Respiratory Rate 16 (breaths/min): Photos: [1:No Photos] [N/A:N/A] Wound Location: [1:Left Lower Leg - Medial] [N/A:N/A] Wounding Event: [1:Trauma] [N/A:N/A] Primary Etiology: [1:Venous Leg Ulcer] [N/A:N/A] Comorbid History: [1:Cataracts, Anemia, Arrhythmia, Hypertension, Rheumatoid Arthritis, Osteoarthritis] [N/A:N/A] Date Acquired: [1:05/02/2017] [N/A:N/A] Weeks of Treatment: [1:0] [N/A:N/A] Wound Status: [1:Open] [N/A:N/A] Measurements L x W x D [1:3.7x2.6x0.1] [N/A:N/A] (cm) Area (cm) : [1:7.556] [N/A:N/A] Volume (cm) : [1:0.756] [N/A:N/A] Classification: [1:Full Thickness Without Exposed Support Structures] [N/A:N/A] Exudate Amount: [1:Large] [N/A:N/A] Exudate Type: [1:Purulent] [N/A:N/A] Exudate Color: [1:yellow, brown, green] [N/A:N/A] Wound Margin: [1:Flat and Intact] [N/A:N/A] Granulation Amount: [1:Small (1-33%)] [N/A:N/A] Granulation Quality: [1:Red] [N/A:N/A] Necrotic Amount: [1:Large (67-100%)] [N/A:N/A] Exposed Structures: [1:Fat Layer (Subcutaneous Tissue) Exposed: Yes Fascia: No Tendon: No Muscle: No Joint: No Bone: No] [N/A:N/A] Epithelialization: [1:None] [N/A:N/A] Debridement: [1:Debridement (11042-11047)] [N/A:N/A] Pre-procedure [1:09:54] [N/A:N/A] Verification/Time Out Taken: Pain Control: [1:Lidocaine 4% Topical Solution] [N/A:N/A] Tissue Debrided: [1:Fibrin/Slough, Subcutaneous] [N/A:N/A] Level: Skin/Subcutaneous Tissue N/A N/A Debridement Area (sq cm): 9.62 N/A N/A Instrument: Curette N/A N/A Bleeding: Minimum N/A N/A Hemostasis Achieved: Pressure N/A N/A Procedural Pain: 0  N/A N/A Post Procedural Pain: 0 N/A N/A Debridement Treatment Procedure was tolerated well N/A N/A Response: Post Debridement 3.7x2.6x0.2 N/A N/A Measurements L x W x D (cm) Post Debridement Volume: 1.511 N/A N/A (cm) Periwound Skin Texture: Excoriation: No N/A N/A Induration: No Callus: No Crepitus: No Rash:  No Scarring: No Periwound Skin Moisture: Maceration: No N/A N/A Dry/Scaly: No Periwound Skin Color: Erythema: Yes N/A N/A Atrophie Blanche: No Cyanosis: No Ecchymosis: No Hemosiderin Staining: No Mottled: No Pallor: No Rubor: No Erythema Location: Circumferential N/A N/A Temperature: No Abnormality N/A N/A Tenderness on Palpation: Yes N/A N/A Wound Preparation: Ulcer Cleansing: N/A N/A Rinsed/Irrigated with Saline Topical Anesthetic Applied: Other: lidocaine 4% Procedures Performed: Debridement N/A N/A Treatment Notes Electronic Signature(s) Signed: 09/07/2017 4:45:41 PM By: Baltazar Najjar MD Entered By: Baltazar Najjar on 09/07/2017 10:13:40 [name] ([id number]) -------------------------------------------------------------------------------- Multi-Disciplinary Care Plan Details Patient Name: [name] Date of Service: 09/07/2017 8:45 AM Medical Record Number: [id number] Patient Account Number: [id number] Date of Birth/Sex: 25-Jan-1932 (85 y.o. Female) Treating RN: Curtis Sites Primary Care Luster Hechler: Gabriel Cirri Other Clinician: Referring Rc Amison: Gabriel Cirri Treating Brazos Sandoval/Extender: Altamese [place] in Treatment: 0 Active Inactive ` Abuse / Safety / Falls / Self Care Management Nursing Diagnoses: Potential for falls Goals: Patient will not experience any injury related to falls Date Initiated: 09/07/2017 Target Resolution Date: 12/03/2017 Goal Status: Active Interventions: Assess fall risk on admission and as needed Notes: ` Orientation to the Wound Care Program Nursing Diagnoses: Knowledge deficit related to the wound healing center program Goals: Patient/caregiver will verbalize understanding of the Wound Healing Center Program Date Initiated: 09/07/2017 Target Resolution Date: 12/03/2017 Goal Status: Active Interventions: Provide education on orientation to the wound center Notes: ` Wound/Skin Impairment Nursing  Diagnoses: Impaired tissue integrity Goals: Ulcer/skin breakdown will heal within 14 weeks Date Initiated: 09/07/2017 Target Resolution Date: 12/03/2017 Goal Status: Active Interventions: CAMARA, RENSTROM ([id number]) Assess patient/caregiver ability to obtain necessary supplies Assess patient/caregiver ability to perform ulcer/skin care regimen upon admission and as needed Assess ulceration(s) every visit Notes: Electronic Signature(s) Signed: 09/07/2017 4:09:49 PM By: Curtis Sites Entered By: Curtis Sites on 09/07/2017 09:52:02 [name] ([id number]) -------------------------------------------------------------------------------- Pain Assessment Details Patient Name: [name] Date of Service: 09/07/2017 8:45 AM Medical Record Number: [id number] Patient Account Number: [id number] Date of Birth/Sex: [birth year] (85 y.o. Female) Treating RN: Curtis Sites Primary Care Jalah Warmuth: Gabriel Cirri Other Clinician: Referring Laveda Demedeiros: Gabriel Cirri Treating Nylan Nakatani/Extender: Maxwell Caul Weeks in Treatment: 0 Active Problems Location of Pain Severity and Description of Pain Patient Has Paino Yes Site Locations Pain Location: Pain in Ulcers With Dressing Change: Yes Duration of the Pain. Constant / Intermittento Constant Character of Pain Describe the Pain: Other: stings Pain Management and Medication Current Pain Management: Notes Topical or injectable lidocaine is offered to patient for acute pain when surgical debridement is performed. If needed, Patient is instructed to use over the counter pain medication for the following 24-48 hours after debridement. Wound care MDs do not prescribed pain medications. Patient has chronic pain or uncontrolled pain. Patient has been instructed to make an appointment with their Primary Care Physician for pain management. Electronic Signature(s) Signed: 09/07/2017 4:09:49 PM By: Curtis Sites Entered By: Curtis Sites on 09/07/2017 09:08:37 [name] ([id number]) -------------------------------------------------------------------------------- Patient/Caregiver Education Details Patient Name: [name] Date of Service: 09/07/2017 8:45 AM Medical Record Number: [id number] Patient Account Number: [id number] Date of Birth/Gender: 17-Jul-1932 (85 y.o. Female) Treating RN: Curtis Sites Primary Care Physician: Gabriel Cirri Other Clinician: Referring Physician: Lequita Halt,  CHRISTI Treating Physician/Extender: Altamese [place] in Treatment: 0 Education Assessment Education Provided To: Patient and Caregiver Education Topics Provided Nutrition: Handouts: Nutrition Methods: Explain/Verbal Responses: State content correctly Venous: Handouts: Controlling Swelling with Multilayered Compression Wraps Methods: Explain/Verbal Responses: State content correctly Electronic Signature(s) Signed: 09/07/2017 4:09:49 PM By: Curtis Sites Entered By: Curtis Sites on 09/07/2017 10:27:50 [name] ([id number]) -------------------------------------------------------------------------------- Wound Assessment Details Patient Name: [name] Date of Service: 09/07/2017 8:45 AM Medical Record Number: [id number] Patient Account Number: [id number] Date of Birth/Sex: Dec 24, 1931 (85 y.o. Female) Treating RN: Curtis Sites Primary Care Julio Zappia: Gabriel Cirri Other Clinician: Referring Searcy Miyoshi: Gabriel Cirri Treating Santonio Speakman/Extender: Maxwell Caul Weeks in Treatment: 0 Wound Status Wound Number: 1 Primary Venous Leg Ulcer Etiology: Wound Location: Left Lower Leg - Medial Wound Open Wounding Event: Trauma Status: Date Acquired: 05/02/2017 Comorbid Cataracts, Anemia, Arrhythmia, Hypertension, Weeks Of Treatment: 0 History: Rheumatoid Arthritis, Osteoarthritis Clustered Wound: No Photos Photo Uploaded By: Curtis Sites on 09/07/2017 10:31:29 Wound  Measurements Length: (cm) 3.7 Width: (cm) 2.6 Depth: (cm) 0.1 Area: (cm) 7.556 Volume: (cm) 0.756 % Reduction in Area: % Reduction in Volume: Epithelialization: None Tunneling: No Undermining: No Wound Description Full Thickness Without Exposed Support Classification: Structures Wound Margin: Flat and Intact Exudate Large Amount: Exudate Type: Purulent Exudate Color: yellow, brown, green Foul Odor After Cleansing: No Slough/Fibrino Yes Wound Bed Granulation Amount: Small (1-33%) Exposed Structure Granulation Quality: Red Fascia Exposed: No Necrotic Amount: Large (67-100%) Fat Layer (Subcutaneous Tissue) Exposed: Yes Necrotic Quality: Adherent Slough Tendon Exposed: No Muscle Exposed: No Joint Exposed: No Bone Exposed: No Nordmann, Gilberte ([id number]) Periwound Skin Texture Texture Color No Abnormalities Noted: No No Abnormalities Noted: No Callus: No Atrophie Blanche: No Crepitus: No Cyanosis: No Excoriation: No Ecchymosis: No Induration: No Erythema: Yes Rash: No Erythema Location: Circumferential Scarring: No Hemosiderin Staining: No Mottled: No Moisture Pallor: No No Abnormalities Noted: No Rubor: No Dry / Scaly: No Maceration: No Temperature / Pain Temperature: No Abnormality Tenderness on Palpation: Yes Wound Preparation Ulcer Cleansing: Rinsed/Irrigated with Saline Topical Anesthetic Applied: Other: lidocaine 4%, Treatment Notes Wound #1 (Left, Medial Lower Leg) 1. Cleansed with: Clean wound with Normal Saline 2. Anesthetic Topical Lidocaine 4% cream to wound bed prior to debridement 3. Peri-wound Care: Barrier cream Moisturizing lotion 4. Dressing Applied: Santyl Ointment 5. Secondary Dressing Applied ABD Pad 7. Secured with 3 Layer Compression System - Left Lower Extremity Electronic Signature(s) Signed: 09/07/2017 4:09:49 PM By: Curtis Sites Entered By: Curtis Sites on 09/07/2017 09:28:59 [name]  ([id number]) -------------------------------------------------------------------------------- Vitals Details Patient Name: [name] Date of Service: 09/07/2017 8:45 AM Medical Record Number: [id number] Patient Account Number: [id number] Date of Birth/Sex: July 19, 1932 (85 y.o. Female) Treating RN: Curtis Sites Primary Care Jaliah Foody: Gabriel Cirri Other Clinician: Referring Brianah Hopson: Gabriel Cirri Treating Kacelyn Rowzee/Extender: Altamese [place] in Treatment: 0 Vital Signs Time Taken: 09:09 Temperature (F): 97.9 Height (in): 59 Pulse (bpm): 79 Source: Measured Respiratory Rate (breaths/min): 16 Weight (lbs): 117 Blood Pressure (mmHg): 170/84 Source: Measured Reference Range: 80 - 120 mg / dl Body Mass Index (BMI): 23.6 Electronic Signature(s) Signed: 09/07/2017 4:09:49 PM By: Curtis Sites Entered By: Curtis Sites on 09/07/2017 09:11:10

## 2017-09-08 NOTE — Progress Notes (Signed)
MEIKO, IVES ([id number]) Visit Report for 09/07/2017 Abuse/Suicide Risk Screen Details Patient Name: Zoe Robinson, Zoe Robinson Date of Service: 09/07/2017 8:45 AM Medical Record Number: [id number] Patient Account Number: [id number] Date of Birth/Sex: 1932-06-19 (85 y.o. Female) Treating RN: Curtis Sites Primary Care Agostino Gorin: Gabriel Cirri Other Clinician: Referring Luiscarlos Kaczmarczyk: Gabriel Cirri Treating Fadel Clason/Extender: Maxwell Caul Weeks in Treatment: 0 Abuse/Suicide Risk Screen Items Answer ABUSE/SUICIDE RISK SCREEN: Has anyone close to you tried to hurt or harm you recentlyo No Do you feel uncomfortable with anyone in your familyo No Has anyone forced you do things that you didnot want to doo No Do you have any thoughts of harming yourselfo No Patient displays signs or symptoms of abuse and/or neglect. No Electronic Signature(s) Signed: 09/07/2017 4:09:49 PM By: Curtis Sites Entered By: Curtis Sites on 09/07/2017 09:12:34 Zoe Robinson ([id number]) -------------------------------------------------------------------------------- Activities of Daily Living Details Patient Name: Zoe Robinson Date of Service: 09/07/2017 8:45 AM Medical Record Number: [id number] Patient Account Number: [id number] Date of Birth/Sex: 11/13/1931 (85 y.o. Female) Treating RN: Curtis Sites Primary Care Glenard Keesling: Gabriel Cirri Other Clinician: Referring Kaavya Puskarich: Gabriel Cirri Treating Soo Steelman/Extender: Maxwell Caul Weeks in Treatment: 0 Activities of Daily Living Items Answer Activities of Daily Living (Please select one for each item) Drive Automobile Not Able Take Medications Need Assistance Use Telephone Completely Able Care for Appearance Completely Able Use Toilet Completely Able Bath / Shower Need Assistance Dress Self Completely Able Feed Self Completely Able Walk Completely Able Get In / Out Bed Completely Able Housework Completely Able Prepare Meals Completely  Able Handle Money Completely Able Shop for Self Need Assistance Electronic Signature(s) Signed: 09/07/2017 4:09:49 PM By: Curtis Sites Entered By: Curtis Sites on 09/07/2017 09:13:07 Zoe Robinson ([id number]) -------------------------------------------------------------------------------- Education Assessment Details Patient Name: Zoe Robinson Date of Service: 09/07/2017 8:45 AM Medical Record Number: [id number] Patient Account Number: [id number] Date of Birth/Sex: [birth year] (85 y.o. Female) Treating RN: Curtis Sites Primary Care Kemya Shed: Gabriel Cirri Other Clinician: Referring Tyson Parkison: Gabriel Cirri Treating Cailie Bosshart/Extender: Altamese [place] in Treatment: 0 Primary Learner Assessed: Patient Learning Preferences/Education Level/Primary Language Learning Preference: Explanation, Demonstration Highest Education Level: High School Preferred Language: English Cognitive Barrier Assessment/Beliefs Language Barrier: No Translator Needed: No Memory Deficit: No Emotional Barrier: No Cultural/Religious Beliefs Affecting Medical Care: No Physical Barrier Assessment Impaired Vision: No Impaired Hearing: No Decreased Hand dexterity: Yes Limitations: arthritis Knowledge/Comprehension Assessment Knowledge Level: Medium Comprehension Level: Medium Ability to understand written Medium instructions: Ability to understand verbal Medium instructions: Motivation Assessment Anxiety Level: Calm Cooperation: Cooperative Education Importance: Acknowledges Need Interest in Health Problems: Asks Questions Perception: Coherent Willingness to Engage in Self- Medium Management Activities: Readiness to Engage in Self- Medium Management Activities: Electronic Signature(s) Signed: 09/07/2017 4:09:49 PM By: Curtis Sites Entered By: Curtis Sites on 09/07/2017 09:14:37 Zoe Robinson  ([id number]) -------------------------------------------------------------------------------- Fall Risk Assessment Details Patient Name: Zoe Robinson Date of Service: 09/07/2017 8:45 AM Medical Record Number: [id number] Patient Account Number: [id number] Date of Birth/Sex: 1932/01/08 (85 y.o. Female) Treating RN: Curtis Sites Primary Care Quin Mcpherson: Gabriel Cirri Other Clinician: Referring Celisse Ciulla: Gabriel Cirri Treating Kay Shippy/Extender: [place] in Treatment: 0 Fall Risk Assessment Items Have you had 2 or more falls in the last 12 monthso 0 No Have you had any fall that resulted in injury in the last 12 monthso 0 Yes FALL RISK ASSESSMENT: History of falling - immediate or within 3 months 25 Yes Secondary diagnosis 0 No Ambulatory aid None/bed rest/wheelchair/nurse 0 Yes Crutches/cane/walker 0 No Furniture 0 No IV Access/Saline Lock 0 No  Gait/Training Normal/bed rest/immobile 0 No Weak 10 Yes Impaired 0 No Mental Status Oriented to own ability 0 Yes Electronic Signature(s) Signed: 09/07/2017 4:09:49 PM By: Curtis Sites Entered By: Curtis Sites on 09/07/2017 09:13:54 Zoe Robinson ([id number]) -------------------------------------------------------------------------------- Foot Assessment Details Patient Name: Zoe Robinson Date of Service: 09/07/2017 8:45 AM Medical Record Number: [id number] Patient Account Number: [id number] Date of Birth/Sex: [birth year] (85 y.o. Female) Treating RN: Curtis Sites Primary Care Bianka Liberati: Gabriel Cirri Other Clinician: Referring Luken Shadowens: Gabriel Cirri Treating Nyliah Nierenberg/Extender: Maxwell Caul Weeks in Treatment: 0 Foot Assessment Items Site Locations + = Sensation present, - = Sensation absent, C = Callus, U = Ulcer R = Redness, W = Warmth, M = Maceration, PU = Pre-ulcerative lesion F = Fissure, S = Swelling, D = Dryness Assessment Right: Left: Other Deformity: No No Prior Foot Ulcer: No  No Prior Amputation: No No Charcot Joint: No No Ambulatory Status: Ambulatory Without Help Gait: Steady Electronic Signature(s) Signed: 09/07/2017 4:09:49 PM By: Curtis Sites Entered By: Curtis Sites on 09/07/2017 09:21:04 Zoe Robinson ([id number]) -------------------------------------------------------------------------------- Nutrition Risk Assessment Details Patient Name: Zoe Robinson Date of Service: 09/07/2017 8:45 AM Medical Record Number: [id number] Patient Account Number: [id number] Date of Birth/Sex: July 07, 1932 (85 y.o. Female) Treating RN: Curtis Sites Primary Care Ridge Lafond: Gabriel Cirri Other Clinician: Referring Dante Cooter: Gabriel Cirri Treating Gregor Dershem/Extender: Maxwell Caul Weeks in Treatment: 0 Height (in): 59 Weight (lbs): 117 Body Mass Index (BMI): 23.6 Nutrition Risk Assessment Items NUTRITION RISK SCREEN: I have an illness or condition that made me change the kind and/or amount of 0 No food I eat I eat fewer than two meals per day 0 No I eat few fruits and vegetables, or milk products 0 No I have three or more drinks of beer, liquor or wine almost every day 0 No I have tooth or mouth problems that make it hard for me to eat 0 No I don't always have enough money to buy the food I need 0 No I eat alone most of the time 0 No I take three or more different prescribed or over-the-counter drugs a day 1 Yes Without wanting to, I have lost or gained 10 pounds in the last six months 0 No I am not always physically able to shop, cook and/or feed myself 0 No Nutrition Protocols Good Risk Protocol 0 No interventions needed Moderate Risk Protocol Electronic Signature(s) Signed: 09/07/2017 4:09:49 PM By: Curtis Sites Entered By: Curtis Sites on 09/07/2017 09:14:07

## 2017-09-08 NOTE — Progress Notes (Signed)
Zoe Robinson, Zoe Robinson ([id number]) Visit Report for 09/07/2017 Chief Complaint Document Details Patient Name: Zoe Robinson, Zoe Robinson Date of Service: 09/07/2017 8:45 AM Medical Record Number: [id number] Patient Account Number: [id number] Date of Birth/Sex: 1932-07-26 (84 y.o. Female) Treating RN: Curtis Sites Primary Care Provider: Gabriel Cirri Other Clinician: Referring Provider: Gabriel Cirri Treating Provider/Extender: Maxwell Caul Weeks in Treatment: 0 Information Obtained from: Patient Chief Complaint 09/07/17; patient is here for review of a nonhealing wound on the left medial calf Electronic Signature(s) Signed: 09/07/2017 4:45:41 PM By: Baltazar Najjar MD Entered By: Baltazar Najjar on 09/07/2017 10:15:02 Zoe Robinson ([id number]) -------------------------------------------------------------------------------- Debridement Details Patient Name: Zoe Robinson Date of Service: 09/07/2017 8:45 AM Medical Record Number: [id number] Patient Account Number: [id number] Date of Birth/Sex: 1932/01/14 (84 y.o. Female) Treating RN: Curtis Sites Primary Care Provider: Gabriel Cirri Other Clinician: Referring Provider: Gabriel Cirri Treating Provider/Extender: Altamese [place] in Treatment: 0 Debridement Performed for Wound #1 Left,Medial Lower Leg Assessment: Performed By: Physician Maxwell Caul, MD Debridement: Debridement Severity of Tissue Pre Fat layer exposed Debridement: Pre-procedure Verification/Time Yes - 09:54 Out Taken: Start Time: 09:54 Pain Control: Lidocaine 4% Topical Solution Level: Skin/Subcutaneous Tissue Total Area Debrided (L x W): 3.7 (cm) x 2.6 (cm) = 9.62 (cm) Tissue and other material Viable, Non-Viable, Fibrin/Slough, Subcutaneous debrided: Instrument: Curette Bleeding: Minimum Hemostasis Achieved: Pressure End Time: 09:57 Procedural Pain: 0 Post Procedural Pain: 0 Response to Treatment: Procedure was tolerated well Post  Debridement Measurements of Total Wound Length: (cm) 3.7 Width: (cm) 2.6 Depth: (cm) 0.2 Volume: (cm) 1.511 Character of Wound/Ulcer Post Debridement: Improved Severity of Tissue Post Debridement: Fat layer exposed Post Procedure Diagnosis Same as Pre-procedure Electronic Signature(s) Signed: 09/07/2017 4:09:49 PM By: Curtis Sites Signed: 09/07/2017 4:45:41 PM By: Baltazar Najjar MD Entered By: Curtis Sites on 09/07/2017 09:57:30 Zoe Robinson ([id number]) -------------------------------------------------------------------------------- HPI Details Patient Name: Zoe Robinson Date of Service: 09/07/2017 8:45 AM Medical Record Number: [id number] Patient Account Number: [id number] Date of Birth/Sex: 08/20/1931 (84 y.o. Female) Treating RN: Curtis Sites Primary Care Provider: Gabriel Cirri Other Clinician: Referring Provider: Gabriel Cirri Treating Provider/Extender: Maxwell Caul Weeks in Treatment: 0 History of Present Illness HPI Description: 09/07/17; this is an 84 year old woman who arrives accompanied by her daughter. She has a history of severe rheumatoid arthritis followed by Dr. Corliss Skains in [place]. By review of care everywhere it appears that she had cellulitis of her left lower leg in September 2018. This may have been caused by a dog scratch. She was followed and her primary care office at [facility] affiliated group. She received antibiotics, Silvadene cream as well as Una boots. This was followed through late October and the wound was healed out on 10/31. The patient and her daughters state that the current wound started with a scissor injury to the medial left calf before the wound was actually healed out in other words this is a different wound area. The original wound was more medial on the left anterior tibial area. Since then they've been using Silvadene cream and apparently she changed doctors who recommended Polysporin and they've been referred here  for the injury on her left medial calf. She does not have a history of rheumatoid-related skin issues/vasculitis The patient is a type II diabetic with most recent hemoglobin A1c of 6. She has advanced seropositive rheumatoid arthritis on prednisone 10 mg alternating with 5 and methotrexate. I see she is also on Serrato while I'm not really sure why. She has significant osteoporosis, history of hypercalcemia. ABI in this clinic is  1.08. She was a former smoker Psychologist, prison and probation services) Signed: 09/07/2017 4:45:41 PM By: Baltazar Najjar MD Entered By: Baltazar Najjar on 09/07/2017 10:22:20 Zoe Robinson ([id number]) -------------------------------------------------------------------------------- Physical Exam Details Patient Name: Zoe Robinson Date of Service: 09/07/2017 8:45 AM Medical Record Number: [id number] Patient Account Number: [id number] Date of Birth/Sex: [birth year] (84 y.o. Female) Treating RN: Curtis Sites Primary Care Provider: Gabriel Cirri Other Clinician: Referring Provider: Gabriel Cirri Treating Provider/Extender: Maxwell Caul Weeks in Treatment: 0 Constitutional Patient is hypertensive.. Pulse regular and within target range for patient.Marland Kitchen Respirations regular, non-labored and within target range.. Temperature is normal and within the target range for the patient.Marland Kitchen appears in no distress. Respiratory Respiratory effort is easy and symmetric bilaterally. Rate is normal at rest and on room air.. Bilateral breath sounds are clear and equal in all lobes with no wheezes, rales or rhonchi.. Cardiovascular Heart rhythm and rate regular, without murmur or gallop.. Femoral arteries without bruits and pulses strong.. Pedal pulses palpable and strong bilaterally.. Patient has very frail skin in her lower extremities likely a combination of chronic venous insufficiency and chronic steroid use. She has a stocking on the right leg. Gastrointestinal (GI) Abdomen is soft  and non-distended without masses or tenderness. Bowel sounds active in all quadrants.. No liver or spleen enlargement or tenderness.. Lymphatic None palpable in the popliteal or inguinal area. Integumentary (Hair, Skin) There is no primary skin issue.Marland Kitchen Psychiatric No evidence of depression, anxiety, or agitation. Calm, cooperative, and communicative. Appropriate interactions and affect.. Notes Wound exam; the patient has a fairly large wound on the left medial calf. This is completely covered in yellow gelatinous surface. Using a #5 curet I debrided through this underneath there is afebrile and S adherent subcutaneous tissue that was also debrided as much as the patient couldn't tolerate. We're forced at terminate the procedure because of discomfort. There is still going to be of further need for mechanical as well as enzymatic debridement. There was no evidence of infection around the wound and no evidence of ischemia at the bedside Electronic Signature(s) Signed: 09/07/2017 4:45:41 PM By: Baltazar Najjar MD Entered By: Baltazar Najjar on 09/07/2017 10:21:51 Zoe Robinson ([id number]) -------------------------------------------------------------------------------- Physician Orders Details Patient Name: Zoe Robinson Date of Service: 09/07/2017 8:45 AM Medical Record Number: [id number] Patient Account Number: [id number] Date of Birth/Sex: [birth year] (84 y.o. Female) Treating RN: Curtis Sites Primary Care Provider: Gabriel Cirri Other Clinician: Referring Provider: Gabriel Cirri Treating Provider/Extender: Altamese [place] in Treatment: 0 Verbal / Phone Orders: No Diagnosis Coding Wound Cleansing Wound #1 Left,Medial Lower Leg o Clean wound with Normal Saline. o May Shower, gently pat wound dry prior to applying new dressing. Anesthetic (add to Medication List) Wound #1 Left,Medial Lower Leg o Topical Lidocaine 4% cream applied to wound bed prior to  debridement (In Clinic Only). Primary Wound Dressing Wound #1 Left,Medial Lower Leg o Santyl Ointment Secondary Dressing Wound #1 Left,Medial Lower Leg o ABD pad Dressing Change Frequency Wound #1 Left,Medial Lower Leg o Change Dressing Monday, Wednesday, Friday - HHRN to change dressing on Mondays and Fridays - patient will come to [facility] on Wednesdays Follow-up Appointments Wound #1 Left,Medial Lower Leg o Return Appointment in 1 week. Edema Control Wound #1 Left,Medial Lower Leg o 3 Layer Compression System - Left Lower Extremity - may anchor with unna paste if needed o Patient to wear own compression stockings - Please continue to wear your compression hose on your right lower leg o Elevate legs to the level of the  heart and pump ankles as often as possible Additional Orders / Instructions Wound #1 Left,Medial Lower Leg o Vitamin A; Vitamin C, Zinc - Please add these over the counter supplements - or a multivitamin tablet that contains all 3 o Increase protein intake. o Activity as tolerated Home Health POORVI, KAPALA ([id number]) Wound #1 Left,Medial Lower Leg o Initiate Home Health for Skilled Nursing o Home Health Nurse may visit PRN to address patientos wound care needs. o FACE TO FACE ENCOUNTER: MEDICARE and MEDICAID PATIENTS: I certify that this patient is under my care and that I had a face-to-face encounter that meets the physician face-to-face encounter requirements with this patient on this date. The encounter with the patient was in whole or in part for the following MEDICAL CONDITION: (primary reason for Home Healthcare) MEDICAL NECESSITY: I certify, that based on my findings, NURSING services are a medically necessary home health service. HOME BOUND STATUS: I certify that my clinical findings support that this patient is homebound (i.e., Due to illness or injury, pt requires aid of supportive devices such as  crutches, cane, wheelchairs, walkers, the use of special transportation or the assistance of another person to leave their place of residence. There is a normal inability to leave the home and doing so requires considerable and taxing effort. Other absences are for medical reasons / religious services and are infrequent or of short duration when for other reasons). o If current dressing causes regression in wound condition, may D/C ordered dressing product/s and apply Normal Saline Moist Dressing daily until next Wound Healing Center / Other MD appointment. Notify Wound Healing Center of regression in wound condition at [phone]. o Please direct any NON-WOUND related issues/requests for orders to patient's Primary Care Physician Medications-please add to medication list. Wound #1 Left,Medial Lower Leg o Santyl Enzymatic Ointment Patient Medications Allergies: No Known Allergies Notifications Medication Indication Start End Santyl 09/07/2017 DOSE topical 250 unit/gram ointment - ointment topical to wound with dressing changes Electronic Signature(s) Signed: 09/07/2017 10:24:24 AM By: Baltazar Najjar MD Entered By: Baltazar Najjar on 09/07/2017 10:24:22 Zoe Robinson ([id number]) -------------------------------------------------------------------------------- Problem List Details Patient Name: Zoe Robinson Date of Service: 09/07/2017 8:45 AM Medical Record Number: [id number] Patient Account Number: [id number] Date of Birth/Sex: [birth year] (84 y.o. 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 09/07/2017 Yes I87.312 Chronic venous hypertension (idiopathic) with ulcer of left lower  09/07/2017 Yes extremity M05.80 Other rheumatoid arthritis with rheumatoid factor of unspecified site 09/07/2017 Yes Inactive Problems Resolved Problems Electronic Signature(s) Signed: 09/07/2017 4:45:41 PM By: Baltazar Najjar MD Entered By: Baltazar Najjar on 09/07/2017 10:13:26 Zoe Robinson ([id number]) -------------------------------------------------------------------------------- Progress Note Details Patient Name: Zoe Robinson Date of Service: 09/07/2017 8:45 AM Medical Record Number: [id number] Patient Account Number: [id number] Date of Birth/Sex: 21-Dec-1931 (84 y.o. Female) Treating RN: Curtis Sites Primary Care Provider: Gabriel Cirri Other Clinician: Referring Provider: Gabriel Cirri Treating Provider/Extender: Altamese [place] in Treatment: 0 Subjective Chief Complaint Information obtained from Patient 09/07/17; patient is here for review of a nonhealing wound on the left medial calf History of Present Illness (HPI) 09/07/17; this is an 84 year old woman who arrives accompanied by her daughter. She has a history of severe rheumatoid arthritis followed by Dr. Corliss Skains in [place]. By review of care everywhere it appears that she had cellulitis  of her left lower leg in September 2018. This may have been caused by a dog scratch. She was followed and her primary care office at [facility] affiliated group. She received antibiotics, Silvadene cream as well as Una boots. This was followed through late October and the wound was healed out on 10/31. The patient and her daughters state that the current wound started with a scissor injury to the medial left calf before the wound was actually healed out in other words this is a different wound area. The original wound was more medial on the left anterior tibial area. Since then they've been using Silvadene cream and apparently she changed doctors who recommended Polysporin and they've been referred here for the injury on her  left medial calf. She does not have a history of rheumatoid-related skin issues/vasculitis The patient is a type II diabetic with most recent hemoglobin A1c of 6. She has advanced seropositive rheumatoid arthritis on prednisone 10 mg alternating with 5 and methotrexate. I see she is also on Serrato while I'm not really sure why. She has significant osteoporosis, history of hypercalcemia. ABI in this clinic is 1.08. She was a former smoker Wound History Patient presents with 1 open wound that has been present for approximately 5 months. Patient has been treating wound in the following manner: silvadene. Laboratory tests have not been performed in the last month. Patient reportedly has not tested positive for an antibiotic resistant organism. Patient reportedly has not tested positive for osteomyelitis. Patient reportedly has not had testing performed to evaluate circulation in the legs. Patient experiences the following problems associated with their wounds: swelling. Patient History Information obtained from Patient, Caregiver. Allergies No Known Allergies Family History Cancer - Siblings, Heart Disease - Father,Mother,Siblings, Hypertension - Mother,Father,Siblings, No family history of Diabetes, Hereditary Spherocytosis, Kidney Disease, Lung Disease, Seizures, Stroke, Thyroid Problems, Tuberculosis. Social History Former smoker, Marital Status - Married, Alcohol Use - Never, Drug Use - No History, Caffeine Use - Daily. Medical History Eyes Zoe Robinson, Zoe Robinson ([id number]) Patient has history of Cataracts - removed Denies history of Glaucoma, Optic Neuritis Hematologic/Lymphatic Patient has history of Anemia Denies history of Hemophilia, Human Immunodeficiency Virus, Lymphedema, Sickle Cell Disease Respiratory Denies history of Aspiration, Asthma, Chronic Obstructive Pulmonary Disease (COPD), Pneumothorax, Sleep Apnea, Tuberculosis Cardiovascular Patient has history of Arrhythmia - a  fib, Hypertension Denies history of Angina, Congestive Heart Failure, Coronary Artery Disease, Deep Vein Thrombosis, Hypotension, Myocardial Infarction, Peripheral Arterial Disease, Peripheral Venous Disease, Phlebitis, Vasculitis Gastrointestinal Denies history of Cirrhosis , Colitis, Crohn s, Hepatitis A, Hepatitis B, Hepatitis C Endocrine Denies history of Type I Diabetes, Type II Diabetes Immunological Denies history of Lupus Erythematosus, Raynaud s, Scleroderma Musculoskeletal Patient has history of Rheumatoid Arthritis, Osteoarthritis Denies history of Gout, Osteomyelitis Neurologic Denies history of Dementia, Neuropathy, Seizure Disorder Oncologic Denies history of Received Chemotherapy, Received Radiation Medical And Surgical History Notes Cardiovascular heart murmur Review of Systems (ROS) Constitutional Symptoms (General Health) The patient has no complaints or symptoms. Eyes The patient has no complaints or symptoms. Ear/Nose/Mouth/Throat The patient has no complaints or symptoms. Hematologic/Lymphatic The patient has no complaints or symptoms. Respiratory The patient has no complaints or symptoms. Cardiovascular The patient has no complaints or symptoms. Gastrointestinal The patient has no complaints or symptoms. Endocrine The patient has no complaints or symptoms. Genitourinary Complains or has symptoms of Kidney failure/ Dialysis - CKD stage 3. Immunological The patient has no complaints or symptoms. Integumentary (Skin) The patient has no complaints or symptoms. Musculoskeletal The patient has  no complaints or symptoms. Neurologic The patient has no complaints or symptoms. Oncologic The patient has no complaints or symptoms. Psychiatric Zoe Robinson, Zoe Robinson ([id number]) The patient has no complaints or symptoms. Objective Constitutional Patient is hypertensive.. Pulse regular and within target range for patient.Marland Kitchen Respirations regular, non-labored and  within target range.. Temperature is normal and within the target range for the patient.Marland Kitchen appears in no distress. Vitals Time Taken: 9:09 AM, Height: 59 in, Source: Measured, Weight: 117 lbs, Source: Measured, BMI: 23.6, Temperature: 97.9 F, Pulse: 79 bpm, Respiratory Rate: 16 breaths/min, Blood Pressure: 170/84 mmHg. Respiratory Respiratory effort is easy and symmetric bilaterally. Rate is normal at rest and on room air.. Bilateral breath sounds are clear and equal in all lobes with no wheezes, rales or rhonchi.. Cardiovascular Heart rhythm and rate regular, without murmur or gallop.. Femoral arteries without bruits and pulses strong.. Pedal pulses palpable and strong bilaterally.. Patient has very frail skin in her lower extremities likely a combination of chronic venous insufficiency and chronic steroid use. She has a stocking on the right leg. Gastrointestinal (GI) Abdomen is soft and non-distended without masses or tenderness. Bowel sounds active in all quadrants.. No liver or spleen enlargement or tenderness.. Lymphatic None palpable in the popliteal or inguinal area. Psychiatric No evidence of depression, anxiety, or agitation. Calm, cooperative, and communicative. Appropriate interactions and affect.. General Notes: Wound exam; the patient has a fairly large wound on the left medial calf. This is completely covered in yellow gelatinous surface. Using a #5 curet I debrided through this underneath there is afebrile and S adherent subcutaneous tissue that was also debrided as much as the patient couldn't tolerate. We're forced at terminate the procedure because of discomfort. There is still going to be of further need for mechanical as well as enzymatic debridement. There was no evidence of infection around the wound and no evidence of ischemia at the bedside Integumentary (Hair, Skin) There is no primary skin issue.. Wound #1 status is Open. Original cause of wound was Trauma. The  wound is located on the Left,Medial Lower Leg. The wound measures 3.7cm length x 2.6cm width x 0.1cm depth; 7.556cm^2 area and 0.756cm^3 volume. There is Fat Layer (Subcutaneous Tissue) Exposed exposed. There is no tunneling or undermining noted. There is a large amount of purulent drainage noted. The wound margin is flat and intact. There is small (1-33%) red granulation within the wound bed. There is a large (67-100%) amount of necrotic tissue within the wound bed including Adherent Slough. The periwound skin appearance exhibited: Erythema. The periwound skin appearance did not exhibit: Callus, Crepitus, Excoriation, Induration, Rash, Scarring, Dry/Scaly, Maceration, Atrophie Blanche, Cyanosis, Ecchymosis, Hemosiderin Staining, Mottled, Pallor, Rubor. The surrounding wound skin color is noted with erythema which is circumferential. Periwound temperature was noted as No Abnormality. The periwound has tenderness on palpation. Zoe Robinson, Zoe Robinson ([id number]) Assessment Active Problems ICD-10 6060800936 - 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 muscle I87.312 - Chronic venous hypertension (idiopathic) with ulcer of left lower extremity M05.80 - Other rheumatoid arthritis with rheumatoid factor of unspecified site Procedures Wound #1 Pre-procedure diagnosis of Wound #1 is a Venous Leg Ulcer located on the Left,Medial Lower Leg .Severity of Tissue Pre Debridement is: Fat layer exposed. There was a Skin/Subcutaneous Tissue Debridement ([id number]) debridement with total area of 9.62 sq cm performed by Maxwell Caul, MD. with the following instrument(s): Curette to remove Viable and Non-Viable tissue/material including Fibrin/Slough and Subcutaneous after achieving pain control using Lidocaine 4% Topical Solution. A time  out was conducted at 09:54, prior to the start of the procedure. A Minimum amount of bleeding was controlled with Pressure. The procedure was tolerated well with a  pain level of 0 throughout and a pain level of 0 following the procedure. Post Debridement Measurements: 3.7cm length x 2.6cm width x 0.2cm depth; 1.511cm^3 volume. Character of Wound/Ulcer Post Debridement is improved. Severity of Tissue Post Debridement is: Fat layer exposed. Post procedure Diagnosis Wound #1: Same as Pre-Procedure Plan Wound Cleansing: Wound #1 Left,Medial Lower Leg: Clean wound with Normal Saline. May Shower, gently pat wound dry prior to applying new dressing. Anesthetic (add to Medication List): Wound #1 Left,Medial Lower Leg: Topical Lidocaine 4% cream applied to wound bed prior to debridement (In Clinic Only). Primary Wound Dressing: Wound #1 Left,Medial Lower Leg: Santyl Ointment Secondary Dressing: Wound #1 Left,Medial Lower Leg: ABD pad Dressing Change Frequency: Wound #1 Left,Medial Lower Leg: Change Dressing Monday, Wednesday, Friday - HHRN to change dressing on Mondays and Fridays - patient will come to [facility] on Wednesdays Follow-up Appointments: Wound #1 Left,Medial Lower Leg: Return Appointment in 1 week. Edema Control: Wound #1 Left,Medial Lower Leg: Zoe Robinson, Zoe Robinson ([id number]) 3 Layer Compression System - Left Lower Extremity - may anchor with unna paste if needed Patient to wear own compression stockings - Please continue to wear your compression hose on your right lower leg Elevate legs to the level of the heart and pump ankles as often as possible Additional Orders / Instructions: Wound #1 Left,Medial Lower Leg: Vitamin A; Vitamin C, Zinc - Please add these over the counter supplements - or a multivitamin tablet that contains all 3 Increase protein intake. Activity as tolerated Home Health: Wound #1 Left,Medial Lower Leg: Initiate Home Health for Skilled Nursing Home Health Nurse may visit PRN to address patient s wound care needs. FACE TO FACE ENCOUNTER: MEDICARE and MEDICAID PATIENTS: I certify that this patient is  under my care and that I had a face-to-face encounter that meets the physician face-to-face encounter requirements with this patient on this date. The encounter with the patient was in whole or in part for the following MEDICAL CONDITION: (primary reason for Home Healthcare) MEDICAL NECESSITY: I certify, that based on my findings, NURSING services are a medically necessary home health service. HOME BOUND STATUS: I certify that my clinical findings support that this patient is homebound (i.e., Due to illness or injury, pt requires aid of supportive devices such as crutches, cane, wheelchairs, walkers, the use of special transportation or the assistance of another person to leave their place of residence. There is a normal inability to leave the home and doing so requires considerable and taxing effort. Other absences are for medical reasons / religious services and are infrequent or of short duration when for other reasons). If current dressing causes regression in wound condition, may D/C ordered dressing product/s and apply Normal Saline Moist Dressing daily until next Wound Healing Center / Other MD appointment. Notify Wound Healing Center of regression in wound condition at [phone]. Please direct any NON-WOUND related issues/requests for orders to patient's Primary Care Physician Medications-please add to medication list.: Wound #1 Left,Medial Lower Leg: Santyl Enzymatic Ointment The following medication(s) was prescribed: Santyl topical 250 unit/gram ointment ointment topical to wound with dressing changes starting 09/07/2017 #1 this will not be an easy area to heal. Extensive debridement and especially in the inferior medial part of the wound I could not get close to a viable surface. #2 certainly will need ongoing  treatment with Santyl. Iodoflex would be an alternative. We are going to order home health to change the dressing hopefully 2 times per week. #3 she has some element of  chronic venous insufficiency I'm going to put her in 3 layer compression. I told him this should be tight but should not hurt. They're to remove it if the pain is unbearable/she can't sleep etc. #4 the patient has frail skin related to chronic prednisone use. This will also make healing more difficult. #5 unfortunately she is likely to require more mechanical debridements, she did not tolerate this well Electronic Signature(s) Signed: 09/07/2017 4:45:41 PM By: Baltazar Najjar MD Entered By: Baltazar Najjar on 09/07/2017 10:26:00 Zoe Robinson ([id number]) -------------------------------------------------------------------------------- ROS/PFSH Details Patient Name: Zoe Robinson Date of Service: 09/07/2017 8:45 AM Medical Record Number: [id number] Patient Account Number: [id number] Date of Birth/Sex: 06-Jan-1932 (84 y.o. Female) Treating RN: Curtis Sites Primary Care Provider: Gabriel Cirri Other Clinician: Referring Provider: Gabriel Cirri Treating Provider/Extender: Altamese [place] in Treatment: 0 Information Obtained From Patient Caregiver Wound History Do you currently have one or more open woundso Yes How many open wounds do you currently haveo 1 Approximately how long have you had your woundso 5 months How have you been treating your wound(s) until nowo [place] Has your wound(s) ever healed and then re-openedo No Have you had any lab work done in the past montho No Have you tested positive for an antibiotic resistant organism (MRSA, VRE)o No Have you tested positive for osteomyelitis (bone infection)o No Have you had any tests for circulation on your legso No Have you had other problems associated with your woundso Swelling Genitourinary Complaints and Symptoms: Positive for: Kidney failure/ Dialysis - CKD stage 3 Constitutional Symptoms (General Health) Complaints and Symptoms: No Complaints or Symptoms Eyes Complaints and Symptoms: No Complaints or  Symptoms Medical History: Positive for: Cataracts - removed Negative for: Glaucoma; Optic Neuritis Ear/Nose/Mouth/Throat Complaints and Symptoms: No Complaints or Symptoms Hematologic/Lymphatic Complaints and Symptoms: No Complaints or Symptoms Medical History: Positive for: Anemia Negative for: Hemophilia; Human Immunodeficiency Virus; Lymphedema; Sickle Cell Disease Respiratory Zoe Robinson, Zoe Robinson ([id number]) Complaints and Symptoms: No Complaints or Symptoms Medical History: Negative for: Aspiration; Asthma; Chronic Obstructive Pulmonary Disease (COPD); Pneumothorax; Sleep Apnea; Tuberculosis Cardiovascular Complaints and Symptoms: No Complaints or Symptoms Medical History: Positive for: Arrhythmia - a fib; Hypertension Negative for: Angina; Congestive Heart Failure; Coronary Artery Disease; Deep Vein Thrombosis; Hypotension; Myocardial Infarction; Peripheral Arterial Disease; Peripheral Venous Disease; Phlebitis; Vasculitis Past Medical History Notes: heart murmur Gastrointestinal Complaints and Symptoms: No Complaints or Symptoms Medical History: Negative for: Cirrhosis ; Colitis; Crohnos; Hepatitis A; Hepatitis B; Hepatitis C Endocrine Complaints and Symptoms: No Complaints or Symptoms Medical History: Negative for: Type I Diabetes; Type II Diabetes Immunological Complaints and Symptoms: No Complaints or Symptoms Medical History: Negative for: Lupus Erythematosus; Raynaudos; Scleroderma Integumentary (Skin) Complaints and Symptoms: No Complaints or Symptoms Musculoskeletal Complaints and Symptoms: No Complaints or Symptoms Medical History: Positive for: Rheumatoid Arthritis; Osteoarthritis Negative for: Gout; Osteomyelitis Neurologic Zoe Robinson, Zoe Robinson ([id number]) Complaints and Symptoms: No Complaints or Symptoms Medical History: Negative for: Dementia; Neuropathy; Seizure Disorder Oncologic Complaints and Symptoms: No Complaints or Symptoms Medical  History: Negative for: Received Chemotherapy; Received Radiation Psychiatric Complaints and Symptoms: No Complaints or Symptoms HBO Extended History Items Eyes: Cataracts Immunizations Pneumococcal Vaccine: Received Pneumococcal Vaccination: Yes Immunization Notes: up to date Implantable Devices Family and Social History Cancer: Yes - Siblings; Diabetes: No; Heart Disease: Yes - Father,Mother,Siblings; Hereditary Spherocytosis: No; Hypertension: Yes - Mother,Father,Siblings; Kidney  Disease: No; Lung Disease: No; Seizures: No; Stroke: No; Thyroid Problems: No; Tuberculosis: No; Former smoker; Marital Status - Married; Alcohol Use: Never; Drug Use: No History; Caffeine Use: Daily; Financial Concerns: No; Food, Clothing or Shelter Needs: No; Support System Lacking: No; Transportation Concerns: No; Advanced Directives: No; Patient does not want information on Advanced Directives Electronic Signature(s) Signed: 09/07/2017 4:09:49 PM By: Curtis Sites Signed: 09/07/2017 4:45:41 PM By: Baltazar Najjar MD Entered By: Curtis Sites on 09/07/2017 09:20:18 Zoe Robinson ([id number]) -------------------------------------------------------------------------------- SuperBill Details Patient Name: Zoe Robinson Date of Service: 09/07/2017 Medical Record Number: [id number] Patient Account Number: [id number] Date of Birth/Sex: [birth year] (84 y.o. Female) Treating RN: Curtis Sites Primary Care Provider: Gabriel Cirri Other Clinician: Referring Provider: Gabriel Cirri Treating Provider/Extender: Maxwell Caul We[place] in Treatment: 0 Diagnosis Coding ICD-10 Codes Code Description [phone] 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 muscle I87.312 Chronic venous hypertension (idiopathic) with ulcer of left lower extremity M05.80 Other rheumatoid arthritis with rheumatoid factor of unspecified site Facility Procedures CPT4 Code: 24401027 Description: 99213 - WOUND CARE  VISIT-LEV 3 EST PT Modifier: Quantity: 1 CPT4 Code: [id number] Description: 11042 - DEB SUBQ TISSUE 20 SQ CM/< ICD-10 Diagnosis Description L97.223 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 mus Modifier: cle Quantity: 1 Physician Procedures CPT4 Code: [id number] Description: 99204 - WC PHYS LEVEL 4 - NEW PT ICD-10 Diagnosis Description L97.223 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

## 2017-09-14 ENCOUNTER — Encounter: Payer: Medicare Other | Admitting: Internal Medicine

## 2017-09-14 DIAGNOSIS — L97223 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 muscle: Secondary | ICD-10-CM | POA: Diagnosis not present

## 2017-09-15 NOTE — Progress Notes (Deleted)
Office Visit Note  Patient: Zoe Robinson             Date of Birth: [birth year]           MRN: [id number]             PCP: Gabriel Cirri, NP Referring: Marcell Anger, NP Visit Date: 09/29/2017 Occupation: @GUAROCC @    Subjective:  No chief complaint on file.   History of Present Illness: Zoe Robinson is a 84 y.o. female ***   Activities of Daily Living:  Patient reports morning stiffness for *** {minute/hour:19697}.   Patient {ACTIONS;DENIES/REPORTS:21021675::"Denies"} nocturnal pain.  Difficulty dressing/grooming: {ACTIONS;DENIES/REPORTS:21021675::"Denies"} Difficulty climbing stairs: {ACTIONS;DENIES/REPORTS:21021675::"Denies"} Difficulty getting out of chair: {ACTIONS;DENIES/REPORTS:21021675::"Denies"} Difficulty using hands for taps, buttons, cutlery, and/or writing: {ACTIONS;DENIES/REPORTS:21021675::"Denies"}   No Rheumatology ROS completed.   PMFS History:  Patient Active Problem List   Diagnosis Date Noted  . High risk medication use 03/25/2017  . Rheumatoid arthritis involving multiple sites with positive rheumatoid factor (HCC) 03/15/2017  . Rheumatoid arthritis involving both knees with positive rheumatoid factor (HCC) 03/15/2017  . Rheumatoid arthritis involving both hands with positive rheumatoid factor (HCC) 03/15/2017  . Rheumatoid arthritis involving both feet with positive rheumatoid factor (HCC) 03/15/2017  . History of vertebral fracture 03/15/2017  . On prednisone therapy 02/10/2017  . Age-related osteoporosis without current pathological fracture 02/10/2017  . History of pulmonary hypertension 02/10/2017  . History of atrial fibrillation 02/10/2017  . History of anemia 02/10/2017  . History of recurrent UTIs 02/10/2017  . History of hypercalcemia 02/10/2017  . On anticoagulant therapy 02/10/2017  . Acute encephalopathy 08/19/2014  . Neutropenia (HCC) 04/20/2014  . Fever, unspecified 04/20/2014  . Unspecified constipation 04/20/2014  .  Hypercalcemia 04/15/2014  . Acute renal failure (HCC) 04/15/2014  . Generalized weakness 04/15/2014  . Dehydration 04/15/2014  . UTI (urinary tract infection) 04/15/2014  . Iron deficiency anemia 04/15/2014  . CKD (chronic kidney disease)   . Hypertension   . Atrial fibrillation [facility])     Past Medical History:  Diagnosis Date  . Arthritis   . Atrial fibrillation (HCC)   . CKD (chronic kidney disease)   . Hypertension   . RA (rheumatoid arthritis) (HCC)     Family History  Problem Relation Age of Onset  . Heart attack Mother   . Heart attack Father   . Heart attack Brother   . Heart attack Brother   . Stomach cancer Brother   . Cancer Sister   . Dementia Sister   . Osteoarthritis Daughter   . Depression Daughter   . Hypothyroidism Daughter   . Hypercholesterolemia Daughter   . Heart disease Son   . Hypertension Son   . Gout Son    Past Surgical History:  Procedure Laterality Date  . ABDOMINAL HYSTERECTOMY     Social History   Social History Narrative  . Not on file     Objective: Vital Signs: There were no vitals taken for this visit.   Physical Exam   Musculoskeletal Exam: ***  CDAI Exam: No CDAI exam completed.    Investigation: No additional findings. CBC Latest Ref Rng & Units 08/01/2017 07/11/2017 06/23/2017  WBC 3.8 - 10.8 Thousand/uL 8.2 8.9 9.9  Hemoglobin 11.7 - 15.5 g/dL 10.9(L) 10.0(L) 10.1(L)  Hematocrit 35.0 - 45.0 % 31.9(L) 29.3(L) 29.9(L)  Platelets 140 - 400 Thousand/uL 370 363 421(H)   CMP Latest Ref Rng & Units 08/01/2017 07/11/2017 06/23/2017  Glucose 65 - 99 mg/dL 81 06/25/2017) 671(I)  BUN 7 -  25 mg/dL 19 45(W) 24  Creatinine 0.60 - 0.88 mg/dL 0.98 1.19 1.47(W)  Sodium 135 - 146 mmol/L 132(L) 133(L) 134(L)  Potassium 3.5 - 5.3 mmol/L 5.0 4.7 5.1  Chloride 98 - 110 mmol/L 96(L) 95(L) 97(L)  CO2 20 - 32 mmol/L 29 29 30   Calcium 8.6 - 10.4 mg/dL 9.7 9.3 9.7  Total Protein 6.1 - 8.1 g/dL 7.3 6.8 7.1  Total Bilirubin 0.2 - 1.2 mg/dL 0.4  0.3 0.3  Alkaline Phos 39 - 117 U/L - - -  AST 10 - 35 U/L 18 18 18   ALT 6 - 29 U/L 11 11 12     Imaging: No results found.  Speciality Comments: No specialty comments available.    Procedures:  No procedures performed Allergies: Patient has no known allergies.   Assessment / Plan:     Visit Diagnoses: No diagnosis found.    Orders: No orders of the defined types were placed in this encounter.  No orders of the defined types were placed in this encounter.   Face-to-face time spent with patient was *** minutes. 50% of time was spent in counseling and coordination of care.  Follow-Up Instructions: No Follow-up on file.   , CMA  Note - This record has been created using .  Chart creation errors have been sought, but may not always  have been located. Such creation errors do not reflect on  the standard of medical care.

## 2017-09-15 NOTE — Progress Notes (Signed)
ANET, LOGSDON ([id number]) Visit Report for 09/14/2017 Debridement Details Patient Name: Zoe Robinson, Zoe Robinson Date of Service: 09/14/2017 12:30 PM Medical Record Number: [id number] Patient Account Number: [id number] Date of Birth/Sex: [birth year] (84 y.o. Female) Treating RN: Huel Coventry Primary Care Provider: Gabriel Cirri Other Clinician: Referring Provider: Gabriel Cirri Treating Provider/Extender: Maxwell Caul Weeks in Treatment: 1 Debridement Performed for Wound #1 Left,Medial Lower Leg Assessment: Performed By: Physician Maxwell Caul, MD Debridement: Debridement Severity of Tissue Pre Fat layer exposed Debridement: Pre-procedure Verification/Time Yes - 13:05 Out Taken: Start Time: 13:08 Pain Control: Other : lidocaine 4% Level: Skin/Subcutaneous Tissue Total Area Debrided (L x W): 3.1 (cm) x 1.8 (cm) = 5.58 (cm) Tissue and other material Viable, Non-Viable, Fat, Fibrin/Slough, Subcutaneous debrided: Instrument: Curette Bleeding: Moderate Hemostasis Achieved: Pressure End Time: 13:09 Procedural Pain: 3 Post Procedural Pain: 3 Response to Treatment: Procedure was tolerated well Post Debridement Measurements of Total Wound Length: (cm) 3.1 Width: (cm) 1.8 Depth: (cm) 0.2 Volume: (cm) 0.877 Character of Wound/Ulcer Post Debridement: Stable Severity of Tissue Post Debridement: Fat layer exposed Post Procedure Diagnosis Same as Pre-procedure Electronic Signature(s) Signed: 09/14/2017 5:05:40 PM By: Baltazar Najjar MD Signed: 09/14/2017 5:24:42 PM By: Elliot Gurney, BSN, RN, CWS, Kim RN, BSN Entered By: Baltazar Najjar on 09/14/2017 13:27:48 Zoe Robinson ([id number]) -------------------------------------------------------------------------------- HPI Details Patient Name: Zoe Robinson Date of Service: 09/14/2017 12:30 PM Medical Record Number: [id number] Patient Account Number: [id number] Date of Birth/Sex: May 21, 1932 (84 y.o. Female) Treating RN:  Huel Coventry Primary Care Provider: Gabriel Cirri Other Clinician: Referring Provider: Gabriel Cirri Treating Provider/Extender: Maxwell Caul Weeks in Treatment: 1 History of Present Illness HPI Description: 09/07/17; this is an 84 year old woman who arrives accompanied by her daughter. She has a history of severe rheumatoid arthritis followed by Dr. Corliss Skains in [place]. By review of care everywhere it appears that she had cellulitis of her left lower leg in September 2018. This may have been caused by a dog scratch. She was followed and her primary care office at [facility] affiliated group. She received antibiotics, Silvadene cream as well as Una boots. This was followed through late October and the wound was healed out on 10/31. The patient and her daughters state that the current wound started with a scissor injury to the medial left calf before the wound was actually healed out in other words this is a different wound area. The original wound was more medial on the left anterior tibial area. Since then they've been using Silvadene cream and apparently she changed doctors who recommended Polysporin and they've been referred here for the injury on her left medial calf. She does not have a history of rheumatoid-related skin issues/vasculitis The patient is a type II diabetic with most recent hemoglobin A1c of 6. She has advanced seropositive rheumatoid arthritis on prednisone 10 mg alternating with 5 and methotrexate. I see she is also on Serrato while I'm not really sure why. She has significant osteoporosis, history of hypercalcemia. ABI in this clinic is 1.08. She was a former smoker 09/14/17; patient's wound slightly smaller today however still covered in a very thick adherent necrotic surface. We have been using Santyl under compression. We finally got home health to start yesterday "[facility]" Electronic Signature(s) Signed: 09/14/2017 5:05:40 PM By: Baltazar Najjar MD Entered By:  Baltazar Najjar on 09/14/2017 13:28:52 Zoe Robinson ([id number]) -------------------------------------------------------------------------------- Physical Exam Details Patient Name: Zoe Robinson Date of Service: 09/14/2017 12:30 PM Medical Record Number: [id number] Patient Account Number: [id number] Date of Birth/Sex: [birth year] (84 y.o. Female) Treating RN:  Huel Coventry Primary Care Provider: Gabriel Cirri Other Clinician: Referring Provider: Gabriel Cirri Treating Provider/Extender: Maxwell Caul Weeks in Treatment: 1 Constitutional Sitting or standing Blood Pressure is within target range for patient.. Pulse regular and within target range for patient.Marland Kitchen Respirations regular, non-labored and within target range.. Temperature is normal and within the target range for the patient.Marland Kitchen appears in no distress. Notes Wound exam; left medial calf. Dimensions are smaller. Still covered by necrotic surface debris. Using a #5 curet this was debrided aggressively L low I still don't think I'm getting down to a completely viable surface. We will continue Santyl under compression changed by home health Electronic Signature(s) Signed: 09/14/2017 5:05:40 PM By: Baltazar Najjar MD Entered By: Baltazar Najjar on 09/14/2017 13:30:01 Zoe Robinson ([id number]) -------------------------------------------------------------------------------- Physician Orders Details Patient Name: Zoe Robinson Date of Service: 09/14/2017 12:30 PM Medical Record Number: [id number] Patient Account Number: [id number] Date of Birth/Sex: 19-May-1932 (84 y.o. Female) Treating RN: Huel Coventry Primary Care Provider: Gabriel Cirri Other Clinician: Referring Provider: Gabriel Cirri Treating Provider/Extender: [place] in Treatment: 1 Verbal / Phone Orders: No Diagnosis Coding Wound Cleansing Wound #1 Left,Medial Lower Leg o Clean wound with Normal Saline. o May Shower, gently pat wound  dry prior to applying new dressing. Anesthetic (add to Medication List) Wound #1 Left,Medial Lower Leg o Topical Lidocaine 4% cream applied to wound bed prior to debridement (In Clinic Only). Primary Wound Dressing Wound #1 Left,Medial Lower Leg o Santyl Ointment Secondary Dressing Wound #1 Left,Medial Lower Leg o ABD pad Dressing Change Frequency Wound #1 Left,Medial Lower Leg o Change Dressing Monday, Wednesday, Friday - HHRN to change dressing on Mondays and Fridays - patient will come to [facility] on Wednesdays Follow-up Appointments Wound #1 Left,Medial Lower Leg o Return Appointment in 1 week. Edema Control Wound #1 Left,Medial Lower Leg o 3 Layer Compression System - Left Lower Extremity - may anchor with unna paste if needed o Patient to wear own compression stockings - Please continue to wear your compression hose on your right lower leg o Elevate legs to the level of the heart and pump ankles as often as possible Additional Orders / Instructions Wound #1 Left,Medial Lower Leg o Vitamin A; Vitamin C, Zinc - Please add these over the counter supplements - or a multivitamin tablet that contains all 3 o Increase protein intake. o Activity as tolerated Home Health Zoe Robinson, Zoe Robinson ([id number]) Wound #1 Left,Medial Lower Leg o Continue [facility] Visits - [facility] o Home Health Nurse may visit PRN to address patientos wound care needs. o FACE TO FACE ENCOUNTER: MEDICARE and MEDICAID PATIENTS: I certify that this patient is under my care and that I had a face-to-face encounter that meets the physician face-to-face encounter requirements with this patient on this date. The encounter with the patient was in whole or in part for the following MEDICAL CONDITION: (primary reason for Home Healthcare) MEDICAL NECESSITY: I certify, that based on my findings, NURSING services are a medically necessary home health service. HOME  BOUND STATUS: I certify that my clinical findings support that this patient is homebound (i.e., Due to illness or injury, pt requires aid of supportive devices such as crutches, cane, wheelchairs, walkers, the use of special transportation or the assistance of another person to leave their place of residence. There is a normal inability to leave the home and doing so requires considerable and taxing effort. Other absences are for medical reasons / religious services and are infrequent or of  short duration when for other reasons). o If current dressing causes regression in wound condition, may D/C ordered dressing product/s and apply Normal Saline Moist Dressing daily until next Wound Healing Center / Other MD appointment. Notify Wound Healing Center of regression in wound condition at [phone]. o Please direct any NON-WOUND related issues/requests for orders to patient's Primary Care Physician Medications-please add to medication list. Wound #1 Left,Medial Lower Leg o Santyl Enzymatic Ointment Patient Medications Allergies: No Known Allergies Notifications Medication Indication Start End lidocaine DOSE topical 4 % cream - cream topical Electronic Signature(s) Signed: 09/14/2017 5:05:40 PM By: Baltazar Najjar MD Signed: 09/14/2017 5:24:42 PM By: Elliot Gurney, BSN, RN, CWS, Kim RN, BSN Entered By: Elliot Gurney, BSN, RN, CWS, Kim on 09/14/2017 13:11:19 Zoe Robinson, Zoe Robinson ([id number]) -------------------------------------------------------------------------------- Prescription 09/14/2017 Patient Name: Zoe Robinson Provider: Maxwell Caul MD Date of Birth: 08-01-1931 NPI#: [id number] Sex: F DEA#: [id number] Phone #: [phone] License #: [id number] Patient Address: [facility] [address] [facility] [place], [place] [address] [address], [address] [place], [place] [address] [phone] Allergies No Known  Allergies Medication Medication: Route: Strength: Form: lidocaine topical 4% cream Class: TOPICAL LOCAL ANESTHETICS Dose: Frequency / Time: Indication: cream topical Number of Refills: Number of Units: 0 Generic Substitution: Start Date: End Date: Administered at Substitution Permitted Facility: Yes Time Administered: Time Discontinued: Note to Pharmacy: Signature(s): Date(s): Electronic Signature(s) Signed: 09/14/2017 5:05:40 PM By: Baltazar Najjar MD Signed: 09/14/2017 5:24:42 PM By: Elliot Gurney, BSN, RN, CWS, Kim RN, BSN Entered By: Elliot Gurney, BSN, RN, CWS, Kim on 09/14/2017 13:11:19 Zoe Robinson, Zoe Robinson ([id number]) Zoe Robinson, Zoe Robinson ([id number]) --------------------------------------------------------------------------------  Problem List Details Patient Name: Zoe Robinson Date of Service: 09/14/2017 12:30 PM Medical Record Number: [id number] Patient Account Number: [id number] Date of Birth/Sex: 1932-06-28 (85 y.o. 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 09/07/2017 Yes I87.312 Chronic venous hypertension (idiopathic) with ulcer of left lower 09/07/2017 Yes extremity M05.80 Other rheumatoid arthritis with rheumatoid factor of unspecified site 09/07/2017 Yes Inactive Problems Resolved Problems Electronic Signature(s) Signed: 09/14/2017 5:05:40 PM By: Baltazar Najjar MD Entered By: Baltazar Najjar on 09/14/2017 13:12:07 Zoe Robinson ([id number]) -------------------------------------------------------------------------------- Progress Note Details Patient Name: Zoe Robinson Date of Service: 09/14/2017 12:30 PM Medical Record Number: [id number] Patient Account Number:  [id number] Date of Birth/Sex: 1931/12/08 (85 y.o. Female) Treating RN: Huel Coventry Primary Care Provider: Gabriel Cirri Other Clinician: Referring Provider: Gabriel Cirri Treating Provider/Extender: Maxwell Caul Weeks in Treatment: 1 Subjective History of Present Illness (HPI) 09/07/17; this is an 84 year old woman who arrives accompanied by her daughter. She has a history of severe rheumatoid arthritis followed by Dr. Corliss Skains in [place]. By review of care everywhere it appears that she had cellulitis of her left lower leg in September 2018. This may have been caused by a dog scratch. She was followed and her primary care office at [facility] affiliated group. She received antibiotics, Silvadene cream as well as Una boots. This was followed through late October and the wound was healed out on 10/31. The patient and her daughters state that the current wound started with a scissor injury to the medial left calf before the wound was actually healed out in other words this is a different wound area. The original wound was more medial on the left anterior tibial  area. Since then they've been using Silvadene cream and apparently she changed doctors who recommended Polysporin and they've been referred here for the injury on her left medial calf. She does not have a history of rheumatoid-related skin issues/vasculitis The patient is a type II diabetic with most recent hemoglobin A1c of 6. She has advanced seropositive rheumatoid arthritis on prednisone 10 mg alternating with 5 and methotrexate. I see she is also on Serrato while I'm not really sure why. She has significant osteoporosis, history of hypercalcemia. ABI in this clinic is 1.08. She was a former smoker 09/14/17; patient's wound slightly smaller today however still covered in a very thick adherent necrotic surface. We have been using Santyl under compression. We finally got home health to start yesterday  "[facility]" Objective Constitutional Sitting or standing Blood Pressure is within target range for patient.. Pulse regular and within target range for patient.Marland Kitchen Respirations regular, non-labored and within target range.. Temperature is normal and within the target range for the patient.Marland Kitchen appears in no distress. Vitals Time Taken: 12:50 PM, Height: 59 in, Weight: 117 lbs, BMI: 23.6, Temperature: 98.5 F, Pulse: 80 bpm, Respiratory Rate: 16 breaths/min, Blood Pressure: 127/53 mmHg. General Notes: Wound exam; left medial calf. Dimensions are smaller. Still covered by necrotic surface debris. Using a #5 curet this was debrided aggressively L low I still don't think I'm getting down to a completely viable surface. We will continue Santyl under compression changed by home health Integumentary (Hair, Skin) Wound #1 status is Open. Original cause of wound was Trauma. The wound is located on the Left,Medial Lower Leg. The wound measures 3.1cm length x 1.8cm width x 0.1cm depth; 4.383cm^2 area and 0.438cm^3 volume. There is Fat Layer (Subcutaneous Tissue) Exposed exposed. There is no tunneling or undermining noted. There is a medium amount of serous Zoe Robinson, Zoe Robinson ([id number]) drainage noted. The wound margin is flat and intact. There is medium (34-66%) red, hyper - granulation within the wound bed. There is a medium (34-66%) amount of necrotic tissue within the wound bed including Adherent Slough. The periwound skin appearance did not exhibit: Callus, Crepitus, Excoriation, Induration, Rash, Scarring, Dry/Scaly, Maceration, Atrophie Blanche, Cyanosis, Ecchymosis, Hemosiderin Staining, Mottled, Pallor, Rubor, Erythema. Periwound temperature was noted as No Abnormality. The periwound has tenderness on palpation. Assessment Active Problems ICD-10 L97.223 - 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 muscle I87.312 - Chronic venous hypertension (idiopathic) with ulcer of left lower  extremity M05.80 - Other rheumatoid arthritis with rheumatoid factor of unspecified site Procedures Wound #1 Pre-procedure diagnosis of Wound #1 is a Venous Leg Ulcer located on the Left,Medial Lower Leg .Severity of Tissue Pre Debridement is: Fat layer exposed. There was a Skin/Subcutaneous Tissue Debridement ([id number]) debridement with total area of 5.58 sq cm performed by Maxwell Caul, MD. with the following instrument(s): Curette to remove Viable and Non-Viable tissue/material including Fat Layer (and Subcutaneous Tissue) Exposed, Fibrin/Slough, and Subcutaneous after achieving pain control using Other (lidocaine 4%). A time out was conducted at 13:05, prior to the start of the procedure. A Moderate amount of bleeding was controlled with Pressure. The procedure was tolerated well with a pain level of 3 throughout and a pain level of 3 following the procedure. Post Debridement Measurements: 3.1cm length x 1.8cm width x 0.2cm depth; 0.877cm^3 volume. Character of Wound/Ulcer Post Debridement is stable. Severity of Tissue Post Debridement is: Fat layer exposed. Post procedure Diagnosis Wound #1: Same as Pre-Procedure Plan Wound Cleansing: Wound #1 Left,Medial Lower Leg: Clean wound with Normal  Saline. May Shower, gently pat wound dry prior to applying new dressing. Anesthetic (add to Medication List): Wound #1 Left,Medial Lower Leg: Topical Lidocaine 4% cream applied to wound bed prior to debridement (In Clinic Only). Primary Wound Dressing: Wound #1 Left,Medial Lower Leg: Santyl Ointment Secondary Dressing: Wound #1 Left,Medial Lower Leg: ABD pad Dressing Change FrequencyJAILENE, Zoe Robinson ([id number]) Wound #1 Left,Medial Lower Leg: Change Dressing Monday, Wednesday, Friday - HHRN to change dressing on Mondays and Fridays - patient will come to [facility] on Wednesdays Follow-up Appointments: Wound #1 Left,Medial Lower Leg: Return Appointment in 1  week. Edema Control: Wound #1 Left,Medial Lower Leg: 3 Layer Compression System - Left Lower Extremity - may anchor with unna paste if needed Patient to wear own compression stockings - Please continue to wear your compression hose on your right lower leg Elevate legs to the level of the heart and pump ankles as often as possible Additional Orders / Instructions: Wound #1 Left,Medial Lower Leg: Vitamin A; Vitamin C, Zinc - Please add these over the counter supplements - or a multivitamin tablet that contains all 3 Increase protein intake. Activity as tolerated Home Health: Wound #1 Left,Medial Lower Leg: Continue [facility] Visits - [facility] Nurse may visit PRN to address patient s wound care needs. FACE TO FACE ENCOUNTER: MEDICARE and MEDICAID PATIENTS: I certify that this patient is under my care and that I had a face-to-face encounter that meets the physician face-to-face encounter requirements with this patient on this date. The encounter with the patient was in whole or in part for the following MEDICAL CONDITION: (primary reason for Home Healthcare) MEDICAL NECESSITY: I certify, that based on my findings, NURSING services are a medically necessary home health service. HOME BOUND STATUS: I certify that my clinical findings support that this patient is homebound (i.e., Due to illness or injury, pt requires aid of supportive devices such as crutches, cane, wheelchairs, walkers, the use of special transportation or the assistance of another person to leave their place of residence. There is a normal inability to leave the home and doing so requires considerable and taxing effort. Other absences are for medical reasons / religious services and are infrequent or of short duration when for other reasons). If current dressing causes regression in wound condition, may D/C ordered dressing product/s and apply Normal Saline Moist Dressing daily until next Wound Healing  Center / Other MD appointment. Notify Wound Healing Center of regression in wound condition at [phone]. Please direct any NON-WOUND related issues/requests for orders to patient's Primary Care Physician Medications-please add to medication list.: Wound #1 Left,Medial Lower Leg: Santyl Enzymatic Ointment The following medication(s) was prescribed: lidocaine topical 4 % cream cream topical was prescribed at facility #1 continue with Santyl ointment/ABDs under compression #2 changed by home health #3 still likely to require mechanical debridements although the dimensions today were smaller Electronic Signature(s) Signed: 09/14/2017 5:05:40 PM By: Baltazar Najjar MD Zoe Robinson ([id number]) Entered By: Baltazar Najjar on 09/14/2017 13:31:21 Zoe Robinson ([id number]) -------------------------------------------------------------------------------- SuperBill Details Patient Name: Zoe Robinson Date of Service: 09/14/2017 Medical Record Number: [id number] Patient Account Number: [id number] Date of Birth/Sex: 1932-04-22 (85 y.o. Female) Treating RN: Huel Coventry Primary Care Provider: Gabriel Cirri Other Clinician: Referring Provider: Gabriel Cirri Treating Provider/Extender: Maxwell Caul Weeks in Treatment: 1 Diagnosis Coding ICD-10 Codes Code Description [phone] 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 muscle I87.312 Chronic venous hypertension (idiopathic) with ulcer of left lower extremity M05.80 Other rheumatoid  arthritis with rheumatoid factor of unspecified site Facility Procedures CPT4 Code: 16109604 Description: 11042 - DEB SUBQ TISSUE 20 SQ CM/< ICD-10 Diagnosis Description L97.223 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 musc I87.312 Chronic venous hypertension (idiopathic) with ulcer of left l Modifier: le ower extremity Quantity: 1 Physician Procedures CPT4 Code: [id number] Description: 11042 - WC PHYS SUBQ TISS 20 SQ CM  ICD-10 Diagnosis Description L97.223 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 musc I87.312 Chronic venous hypertension (idiopathic) with ulcer of left l Modifier: le ower extremity Quantity: 1 Electronic Signature(s) Signed: 09/14/2017 1:31:59 PM By: Elliot Gurney, BSN, RN, CWS, Kim RN, BSN Signed: 09/14/2017 5:05:40 PM By: Baltazar Najjar MD Entered By: Elliot Gurney, BSN, RN, CWS, Kim on 09/14/2017 13:31:59

## 2017-09-15 NOTE — Progress Notes (Signed)
[name], [name] ([id number]) Visit Report for 09/14/2017 Arrival Information Details Patient Name: Zoe Robinson, Zoe Robinson Date of Service: 09/14/2017 12:30 PM Medical Record Number: [id number] Patient Account Number: [id number] Date of Birth/Sex: November 14, 1931 (85 y.o. Female) Treating RN: Renne Crigler Primary Care Tyrik Stetzer: Gabriel Cirri Other Clinician: Referring Nora Rooke: Gabriel Cirri Treating Lakeem Rozo/Extender: Altamese [place] in Treatment: 1 Visit Information History Since Last Visit All ordered tests and consults were completed: No Patient Arrived: Ambulatory Added or deleted any medications: No Arrival Time: 12:49 Any new allergies or adverse reactions: No Accompanied By: daughter Had a fall or experienced change in No Transfer Assistance: None activities of daily living that may affect Patient Identification Verified: Yes risk of falls: Secondary Verification Process Yes Signs or symptoms of abuse/neglect since last visito No Completed: Hospitalized since last visit: No Patient Has Alerts: Yes Pain Present Now: No Patient Alerts: Patient on Blood Thinner xarelto Electronic Signature(s) Signed: 09/14/2017 2:38:23 PM By: Renne Crigler Entered By: Renne Crigler on 09/14/2017 12:49:42 Zoe Robinson ([id number]) -------------------------------------------------------------------------------- Encounter Discharge Information Details Patient Name: Zoe Robinson Date of Service: 09/14/2017 12:30 PM Medical Record Number: [id number] Patient Account Number: [id number] Date of Birth/Sex: [birth year] (85 y.o. Female) Treating RN: Huel Coventry Primary Care Lameisha Schuenemann: Gabriel Cirri Other Clinician: Referring Araya Roel: Gabriel Cirri Treating Eulala Newcombe/Extender: Altamese [place] in Treatment: 1 Encounter Discharge Information Items Discharge Pain Level: 0 Discharge Condition: Stable Ambulatory Status: Ambulatory Discharge Destination:  Home Transportation: Private Auto Accompanied By: dtr Schedule Follow-up Appointment: Yes Medication Reconciliation completed and No provided to Patient/Care Jaeli Grubb: Provided on Clinical Summary of Care: 09/14/2017 Form Type Recipient Paper Patient JK Electronic Signature(s) Signed: 09/14/2017 1:41:33 PM By: Curtis Sites Entered By: Curtis Sites on 09/14/2017 13:41:33 Zoe Robinson ([id number]) -------------------------------------------------------------------------------- Lower Extremity Assessment Details Patient Name: Zoe Robinson Date of Service: 09/14/2017 12:30 PM Medical Record Number: [id number] Patient Account Number: [id number] Date of Birth/Sex: 1932/01/04 (85 y.o. Female) Treating RN: Renne Crigler Primary Care Enaya Howze: Gabriel Cirri Other Clinician: Referring Jordie Schreur: Gabriel Cirri Treating Nazyia Gaugh/Extender: Maxwell Caul Weeks in Treatment: 1 Edema Assessment Assessed: [Left: No] [Right: No] [Left: Edema] [Right: :] Calf Left: Right: Point of Measurement: 29 cm From Medial Instep 30.2 cm cm Ankle Left: Right: Point of Measurement: 10 cm From Medial Instep 17.5 cm cm Vascular Assessment Claudication: Claudication Assessment [Left:None] Pulses: Dorsalis Pedis Palpable: [Left:Yes] Posterior Tibial Extremity colors, hair growth, and conditions: Extremity Color: [Left:Normal] Hair Growth on Extremity: [Left:No] Temperature of Extremity: [Left:Warm] Capillary Refill: [Left:> 3 seconds] Toe Nail Assessment Left: Right: Thick: Yes Discolored: Yes Deformed: Yes Improper Length and Hygiene: Yes Electronic Signature(s) Signed: 09/14/2017 2:38:23 PM By: Renne Crigler Entered By: Renne Crigler on 09/14/2017 13:02:30 Zoe Robinson ([id number]) -------------------------------------------------------------------------------- Multi Wound Chart Details Patient Name: Zoe Robinson Date of Service: 09/14/2017 12:30 PM Medical  Record Number: [id number] Patient Account Number: [id number] Date of Birth/Sex: 1932/07/25 (85 y.o. Female) Treating RN: Huel Coventry Primary Care Osiris Charles: Gabriel Cirri Other Clinician: Referring Eileene Kisling: Gabriel Cirri Treating Cylan Borum/Extender: Maxwell Caul Weeks in Treatment: 1 Vital Signs Height(in): 59 Pulse(bpm): 80 Weight(lbs): 117 Blood Pressure(mmHg): 127/53 Body Mass Index(BMI): 24 Temperature(F): 98.5 Respiratory Rate 16 (breaths/min): Photos: [1:No Photos] [N/A:N/A] Wound Location: [1:Left Lower Leg - Medial] [N/A:N/A] Wounding Event: [1:Trauma] [N/A:N/A] Primary Etiology: [1:Venous Leg Ulcer] [N/A:N/A] Comorbid History: [1:Cataracts, Anemia, Arrhythmia, Hypertension, Rheumatoid Arthritis, Osteoarthritis] [N/A:N/A] Date Acquired: [1:05/02/2017] [N/A:N/A] Weeks of Treatment: [1:1] [N/A:N/A] Wound Status: [1:Open] [N/A:N/A] Measurements L x W x D [1:3.1x1.8x0.1] [N/A:N/A] (cm) Area (cm) : [1:4.383] [N/A:N/A] Volume (cm) : [1:0.438] [N/A:N/A] %  Reduction in Area: [1:42.00%] [N/A:N/A] % Reduction in Volume: [1:42.10%] [N/A:N/A] Classification: [1:Full Thickness Without Exposed Support Structures] [N/A:N/A] Exudate Amount: [1:Medium] [N/A:N/A] Exudate Type: [1:Serous] [N/A:N/A] Exudate Color: [1:amber] [N/A:N/A] Wound Margin: [1:Flat and Intact] [N/A:N/A] Granulation Amount: [1:Medium (34-66%)] [N/A:N/A] Granulation Quality: [1:Red, Hyper-granulation] [N/A:N/A] Necrotic Amount: [1:Medium (34-66%)] [N/A:N/A] Exposed Structures: [1:Fat Layer (Subcutaneous Tissue) Exposed: Yes Fascia: No Tendon: No Muscle: No Joint: No Bone: No] [N/A:N/A] Epithelialization: [1:Small (1-33%)] [N/A:N/A] Debridement: [1:Debridement (11042-11047)] [N/A:N/A] Pre-procedure [1:13:05] [N/A:N/A] Verification/Time Out Taken: RYNLEE, COSMAN ([id number]) Pain Control: Other N/A N/A Tissue Debrided: Fibrin/Slough, Fat, N/A N/A Subcutaneous Level: Skin/Subcutaneous Tissue N/A  N/A Debridement Area (sq cm): 5.58 N/A N/A Instrument: Curette N/A N/A Bleeding: Moderate N/A N/A Hemostasis Achieved: Pressure N/A N/A Procedural Pain: 3 N/A N/A Post Procedural Pain: 3 N/A N/A Debridement Treatment Procedure was tolerated well N/A N/A Response: Post Debridement 3.1x1.8x0.2 N/A N/A Measurements L x W x D (cm) Post Debridement Volume: 0.877 N/A N/A (cm) Periwound Skin Texture: Excoriation: No N/A N/A Induration: No Callus: No Crepitus: No Rash: No Scarring: No Periwound Skin Moisture: Maceration: No N/A N/A Dry/Scaly: No Periwound Skin Color: Atrophie Blanche: No N/A N/A Cyanosis: No Ecchymosis: No Erythema: No Hemosiderin Staining: No Mottled: No Pallor: No Rubor: No Temperature: No Abnormality N/A N/A Tenderness on Palpation: Yes N/A N/A Wound Preparation: Ulcer Cleansing: N/A N/A Rinsed/Irrigated with Saline Topical Anesthetic Applied: Other: lidocaine 4% Procedures Performed: Debridement N/A N/A Treatment Notes Electronic Signature(s) Signed: 09/14/2017 5:05:40 PM By: Baltazar Najjar MD Entered By: Baltazar Najjar on 09/14/2017 13:12:20 Zoe Robinson ([id number]) -------------------------------------------------------------------------------- Multi-Disciplinary Care Plan Details Patient Name: Zoe Robinson Date of Service: 09/14/2017 12:30 PM Medical Record Number: [id number] Patient Account Number: [id number] Date of Birth/Sex: Apr 05, 1932 (85 y.o. Female) Treating RN: Huel Coventry Primary Care Gao Mitnick: Gabriel Cirri Other Clinician: Referring Orby Tangen: Gabriel Cirri Treating Shalise Rosado/Extender: Altamese [place] in Treatment: 1 Active Inactive ` Abuse / Safety / Falls / Self Care Management Nursing Diagnoses: Potential for falls Goals: Patient will not experience any injury related to falls Date Initiated: 09/07/2017 Target Resolution Date: 12/03/2017 Goal Status: Active Interventions: Assess fall risk on admission  and as needed Notes: ` Orientation to the Wound Care Program Nursing Diagnoses: Knowledge deficit related to the wound healing center program Goals: Patient/caregiver will verbalize understanding of the Wound Healing Center Program Date Initiated: 09/07/2017 Target Resolution Date: 12/03/2017 Goal Status: Active Interventions: Provide education on orientation to the wound center Notes: ` Wound/Skin Impairment Nursing Diagnoses: Impaired tissue integrity Goals: Ulcer/skin breakdown will heal within 14 weeks Date Initiated: 09/07/2017 Target Resolution Date: 12/03/2017 Goal Status: Active Interventions: TALAIJAH, MOREHOUSE ([id number]) Assess patient/caregiver ability to obtain necessary supplies Assess patient/caregiver ability to perform ulcer/skin care regimen upon admission and as needed Assess ulceration(s) every visit Notes: Electronic Signature(s) Signed: 09/14/2017 5:24:42 PM By: Elliot Gurney, BSN, RN, CWS, Kim RN, BSN Entered By: Elliot Gurney, BSN, RN, CWS, Kim on 09/14/2017 13:07:36 Zoe Robinson ([id number]) -------------------------------------------------------------------------------- Pain Assessment Details Patient Name: Zoe Robinson Date of Service: 09/14/2017 12:30 PM Medical Record Number: [id number] Patient Account Number: [id number] Date of Birth/Sex: 1931-09-18 (85 y.o. Female) Treating RN: Renne Crigler Primary Care Reyan Helle: Gabriel Cirri Other Clinician: Referring Donzella Carrol: Gabriel Cirri Treating Minor Iden/Extender: Maxwell Caul Weeks in Treatment: 1 Active Problems Location of Pain Severity and Description of Pain Patient Has Paino No Site Locations Pain Management and Medication Current Pain Management: Electronic Signature(s) Signed: 09/14/2017 2:38:23 PM By: Renne Crigler Entered By: Renne Crigler on 09/14/2017 12:49:48 Zoe Robinson  ([id number]) -------------------------------------------------------------------------------- Patient/Caregiver Education Details  Patient Name: ZAKYRA, KUKUK Date of Service: 09/14/2017 12:30 PM Medical Record Number: [id number] Patient Account Number: [id number] Date of Birth/Gender: 1931-08-07 (85 y.o. Female) Treating RN: Curtis Sites Primary Care Physician: Gabriel Cirri Other Clinician: Referring Physician: Gabriel Cirri Treating Physician/Extender: Altamese [place] in Treatment: 1 Education Assessment Education Provided To: Patient and Caregiver Education Topics Provided Venous: Handouts: Other: wrap precautions Methods: Explain/Verbal Responses: State content correctly Electronic Signature(s) Signed: 09/14/2017 4:58:37 PM By: Curtis Sites Entered By: Curtis Sites on 09/14/2017 13:41:48 Zoe Robinson ([id number]) -------------------------------------------------------------------------------- Wound Assessment Details Patient Name: Zoe Robinson Date of Service: 09/14/2017 12:30 PM Medical Record Number: [id number] Patient Account Number: [id number] Date of Birth/Sex: 03/03/1932 (85 y.o. Female) Treating RN: Renne Crigler Primary Care Reine Bristow: Gabriel Cirri Other Clinician: Referring Tirth Cothron: Gabriel Cirri Treating Anette Barra/Extender: Maxwell Caul Weeks in Treatment: 1 Wound Status Wound Number: 1 Primary Venous Leg Ulcer Etiology: Wound Location: Left Lower Leg - Medial Wound Open Wounding Event: Trauma Status: Date Acquired: 05/02/2017 Comorbid Cataracts, Anemia, Arrhythmia, Hypertension, Weeks Of Treatment: 1 History: Rheumatoid Arthritis, Osteoarthritis Clustered Wound: No Photos Photo Uploaded By: Renne Crigler on 09/14/2017 14:37:50 Wound Measurements Length: (cm) 3.1 Width: (cm) 1.8 Depth: (cm) 0.1 Area: (cm) 4.383 Volume: (cm) 0.438 % Reduction in Area: 42% % Reduction in Volume: 42.1% Epithelialization:  Small (1-33%) Tunneling: No Undermining: No Wound Description Full Thickness Without Exposed Support Classification: Structures Wound Margin: Flat and Intact Exudate Medium Amount: Exudate Type: Serous Exudate Color: amber Foul Odor After Cleansing: No Slough/Fibrino Yes Wound Bed Granulation Amount: Medium (34-66%) Exposed Structure Granulation Quality: Red, Hyper-granulation Fascia Exposed: No Necrotic Amount: Medium (34-66%) Fat Layer (Subcutaneous Tissue) Exposed: Yes Necrotic Quality: Adherent Slough Tendon Exposed: No Muscle Exposed: No Joint Exposed: No Bone Exposed: No Blyden, Loreley ([id number]) Periwound Skin Texture Texture Color No Abnormalities Noted: No No Abnormalities Noted: No Callus: No Atrophie Blanche: No Crepitus: No Cyanosis: No Excoriation: No Ecchymosis: No Induration: No Erythema: No Rash: No Hemosiderin Staining: No Scarring: No Mottled: No Pallor: No Moisture Rubor: No No Abnormalities Noted: No Dry / Scaly: No Temperature / Pain Maceration: No Temperature: No Abnormality Tenderness on Palpation: Yes Wound Preparation Ulcer Cleansing: Rinsed/Irrigated with Saline Topical Anesthetic Applied: Other: lidocaine 4%, Treatment Notes Wound #1 (Left, Medial Lower Leg) 1. Cleansed with: Clean wound with Normal Saline Cleanse wound with antibacterial soap and water 2. Anesthetic Topical Lidocaine 4% cream to wound bed prior to debridement 4. Dressing Applied: Santyl Ointment Other dressing (specify in notes) 5. Secondary Dressing Applied ABD Pad Dry Gauze 7. Secured with 3 Layer Compression System - Left Lower Extremity Electronic Signature(s) Signed: 09/14/2017 2:38:23 PM By: Renne Crigler Entered By: Renne Crigler on 09/14/2017 12:58:34 Zoe Robinson ([id number]) -------------------------------------------------------------------------------- Vitals Details Patient Name: Zoe Robinson Date of Service:  09/14/2017 12:30 PM Medical Record Number: [id number] Patient Account Number: [id number] Date of Birth/Sex: April 16, 1932 (85 y.o. Female) Treating RN: Renne Crigler Primary Care Maansi Wike: Gabriel Cirri Other Clinician: Referring Olita Takeshita: Gabriel Cirri Treating Dietrich Samuelson/Extender: Maxwell Caul Weeks in Treatment: 1 Vital Signs Time Taken: 12:50 Temperature (F): 98.5 Height (in): 59 Pulse (bpm): 80 Weight (lbs): 117 Respiratory Rate (breaths/min): 16 Body Mass Index (BMI): 23.6 Blood Pressure (mmHg): 127/53 Reference Range: 80 - 120 mg / dl Electronic Signature(s) Signed: 09/14/2017 2:38:23 PM By: Renne Crigler Entered By: Renne Crigler on 09/14/2017 12:50:10

## 2017-09-21 ENCOUNTER — Encounter: Payer: Medicare Other | Admitting: Internal Medicine

## 2017-09-21 DIAGNOSIS — L97223 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 muscle: Secondary | ICD-10-CM | POA: Diagnosis not present

## 2017-09-22 ENCOUNTER — Encounter: Payer: Self-pay | Admitting: Emergency Medicine

## 2017-09-22 ENCOUNTER — Encounter: Payer: Medicare Other | Admitting: Nurse Practitioner

## 2017-09-22 ENCOUNTER — Emergency Department
Admission: EM | Admit: 2017-09-22 | Discharge: 2017-09-22 | Disposition: A | Discharge status: Home / Self Care | Payer: Medicare Other | Attending: Emergency Medicine | Admitting: Emergency Medicine

## 2017-09-22 DIAGNOSIS — N189 Chronic kidney disease, unspecified: Secondary | ICD-10-CM | POA: Diagnosis not present

## 2017-09-22 DIAGNOSIS — Z79899 Other long term (current) drug therapy: Secondary | ICD-10-CM | POA: Insufficient documentation

## 2017-09-22 DIAGNOSIS — Z7901 Long term (current) use of anticoagulants: Secondary | ICD-10-CM | POA: Insufficient documentation

## 2017-09-22 DIAGNOSIS — I129 Hypertensive chronic kidney disease with stage 1 through stage 4 chronic kidney disease, or unspecified chronic kidney disease: Secondary | ICD-10-CM | POA: Insufficient documentation

## 2017-09-22 DIAGNOSIS — Y829 Unspecified medical devices associated with adverse incidents: Secondary | ICD-10-CM | POA: Diagnosis not present

## 2017-09-22 DIAGNOSIS — T8189XD Other complications of procedures, not elsewhere classified, subsequent encounter: Secondary | ICD-10-CM | POA: Diagnosis not present

## 2017-09-22 DIAGNOSIS — T148XXA Other injury of unspecified body region, initial encounter: Secondary | ICD-10-CM

## 2017-09-22 DIAGNOSIS — L97223 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 muscle: Secondary | ICD-10-CM | POA: Diagnosis not present

## 2017-09-22 DIAGNOSIS — Z87891 Personal history of nicotine dependence: Secondary | ICD-10-CM | POA: Diagnosis not present

## 2017-09-22 NOTE — ED Provider Notes (Signed)
[facility] Emergency Department Provider Note  ____________________________________________   First MD Initiated Contact with Patient 09/22/17 1209     (approximate)  I have reviewed the triage vital signs and the nursing notes.   HISTORY  Chief Complaint Wound Check   HPI Zoe Robinson is a 84 y.o. female who was brought to the emergency department by her daughter for evaluation of chronic wounds to medial left lower extremity.  She has had the wound for the past 3-4 months and was seen in wound clinic today and it was noted to be bleeding.  They attempted to use silver nitrate however the bleeding persisted and as the patient is taking Xarelto they advised her to come to the emergency department.  The patient herself has no particular pain.  No fevers or chills.  No new trauma.  The bleeding began suddenly and has been constant.  Nothing seems to make it better or worse.  Past Medical History:  Diagnosis Date  . Arthritis   . Atrial fibrillation (HCC)   . CKD (chronic kidney disease)   . Hypertension   . RA (rheumatoid arthritis) [facility])     Patient Active Problem List   Diagnosis Date Noted  . High risk medication use 03/25/2017  . Rheumatoid arthritis involving multiple sites with positive rheumatoid factor (HCC) 03/15/2017  . Rheumatoid arthritis involving both knees with positive rheumatoid factor (HCC) 03/15/2017  . Rheumatoid arthritis involving both hands with positive rheumatoid factor (HCC) 03/15/2017  . Rheumatoid arthritis involving both feet with positive rheumatoid factor (HCC) 03/15/2017  . History of vertebral fracture 03/15/2017  . On prednisone therapy 02/10/2017  . Age-related osteoporosis without current pathological fracture 02/10/2017  . History of pulmonary hypertension 02/10/2017  . History of atrial fibrillation 02/10/2017  . History of anemia 02/10/2017  . History of recurrent UTIs 02/10/2017  . History of hypercalcemia  02/10/2017  . On anticoagulant therapy 02/10/2017  . Acute encephalopathy 08/19/2014  . Neutropenia (HCC) 04/20/2014  . Fever, unspecified 04/20/2014  . Unspecified constipation 04/20/2014  . Hypercalcemia 04/15/2014  . Acute renal failure (HCC) 04/15/2014  . Generalized weakness 04/15/2014  . Dehydration 04/15/2014  . UTI (urinary tract infection) 04/15/2014  . Iron deficiency anemia 04/15/2014  . CKD (chronic kidney disease)   . Hypertension   . Atrial fibrillation [facility])     Past Surgical History:  Procedure Laterality Date  . ABDOMINAL HYSTERECTOMY      Prior to Admission medications   Medication Sig Start Date End Date Taking? Authorizing Provider  acetaminophen (TYLENOL) 500 MG tablet Take 1,000 mg by mouth every 8 (eight) hours.    [provider]  Cranberry 500 MG TABS Take by mouth 2 (two) times daily.    [provider]  denosumab (PROLIA) 60 MG/ML SOLN injection Inject 60 mg into the skin every 6 (six) months. Administer in upper arm, thigh, or abdomen    [provider]  feeding supplement, ENSURE COMPLETE, (ENSURE COMPLETE) LIQD Take 237 mLs by mouth 3 (three) times daily between meals. 04/26/14   Joseph Art, DO  ferrous sulfate 325 (65 FE) MG tablet Take 325 mg by mouth daily with breakfast.    [provider]  folic acid (FOLVITE) 1 MG tablet Take 1 tablet (1 mg total) by mouth every morning. 06/24/17   Pollyann Savoy, MD  irbesartan (AVAPRO) 300 MG tablet Take 300 mg by mouth daily. 03/18/17 03/18/18  [provider]  Lactobacillus TABS Take by mouth daily.  [provider]  magnesium gluconate (MAGONATE) 500 MG tablet Take 500 mg by mouth every morning.     [provider]  methotrexate (RHEUMATREX) 2.5 MG tablet Take 5 tablets (12.5 mg total) by mouth once a week. 07/20/17   Pollyann Savoy, MD  mirabegron ER (MYRBETRIQ) 50 MG TB24 tablet Take 50 mg by mouth daily.    [provider]    Multiple Vitamins-Minerals (MULTIVITAMIN PO) Take 1 tablet by mouth every morning.     [provider]  nitroGLYCERIN (NITROSTAT) 0.4 MG SL tablet Place 0.4 mg under the tongue. 08/27/14 08/27/15  [provider]  oxybutynin (DITROPAN-XL) 10 MG 24 hr tablet Take 10 mg by mouth every morning.     [provider]  polyethylene glycol (MIRALAX / GLYCOLAX) packet Take 17 g by mouth daily as needed. Patient taking differently: Take 17 g by mouth daily as needed for mild constipation.  04/26/14   Joseph Art, DO  predniSONE (DELTASONE) 5 MG tablet Take 5-10 mg by mouth every other day. Take 5 mg by mouth every other day alternating with 10 mg by mouth every other day.    [provider]  Rivaroxaban (XARELTO) 15 MG TABS tablet Take 15 mg by mouth every evening.     [provider]  simvastatin (ZOCOR) 40 MG tablet Take 20 mg by mouth every evening.    [provider]  valsartan (DIOVAN) 320 MG tablet Take 320 mg by mouth every morning.  03/29/14   [provider]  vitamin E 1000 UNIT capsule Take 1,000 Units by mouth every morning.    [provider]    Allergies Patient has no known allergies.  Family History  Problem Relation Age of Onset  . Heart attack Mother   . Heart attack Father   . Heart attack Brother   . Heart attack Brother   . Stomach cancer Brother   . Cancer Sister   . Dementia Sister   . Osteoarthritis Daughter   . Depression Daughter   . Hypothyroidism Daughter   . Hypercholesterolemia Daughter   . Heart disease Son   . Hypertension Son   . Gout Son     Social History Social History   Tobacco Use  . Smoking status: Former Smoker    Last attempt to quit: 04/15/1964    Years since quitting: 53.4  . Smokeless tobacco: Never Used  Substance Use Topics  . Alcohol use: No  . Drug use: No    Review of Systems Constitutional: No fever/chills ENT: No sore throat. Cardiovascular: Denies chest  pain. Respiratory: Denies shortness of breath. Gastrointestinal: No abdominal pain.  No nausea, no vomiting.  No diarrhea.  No constipation. Musculoskeletal: Negative for back pain. Neurological: Negative for headaches   ____________________________________________   PHYSICAL EXAM:  VITAL SIGNS: ED Triage Vitals  Enc Vitals Group     BP 09/22/17 1159 139/79     Pulse Rate 09/22/17 1159 92     Resp 09/22/17 1159 16     Temp 09/22/17 1159 98.4 F (36.9 C)     Temp Source 09/22/17 1159 Oral     SpO2 09/22/17 1159 98 %     Weight --      Height --      Head Circumference --      Peak Flow --      Pain Score 09/22/17 1206 0     Pain Loc --      Pain Edu? --  Excl. in GC? --     Constitutional: Alert and oriented x4 pleasant cooperative speaks in full clear sentences no diaphoresis Head: Atraumatic. Nose: No congestion/rhinnorhea. Mouth/Throat: No trismus Neck: No stridor.   Cardiovascular: Regular rate and rhythm Respiratory: Normal respiratory effort.  No retractions. Neurologic:  Normal speech and language. No gross focal neurologic deficits are appreciated.  Skin: Chronic wound to medial left lower extremity not currently bleeding.  Wound is roughly 4-5 cm.  No evidence of purulence erythema warmth tenderness or other infection    ____________________________________________  LABS (all labs ordered are listed, but only abnormal results are displayed)  Labs Reviewed - No data to display   __________________________________________  EKG   ____________________________________________  RADIOLOGY   ____________________________________________   DIFFERENTIAL includes but not limited to  Wound infection, bleeding diathesis, venous insufficiency   PROCEDURES  Procedure(s) performed: no  Procedures  Critical Care performed: no  Observation: no ____________________________________________   INITIAL IMPRESSION / ASSESSMENT AND PLAN / ED  COURSE  Pertinent labs & imaging results that were available during my care of the patient were reviewed by me and considered in my medical decision making (see chart for details).  By the time I saw the patient her bleeding had already stopped.  I had a lengthy discussion with the patient and family at bedside regarding the need for direct pressure in the setting of bleeding particularly when taking a blood thinning medication.  There is no evidence of active infection in the family and the patient have follow-up this coming week.  The patient and her daughter are comfortable going home with strict return precautions.      ____________________________________________   FINAL CLINICAL IMPRESSION(S) / ED DIAGNOSES  Final diagnoses:  Bleeding from wound      NEW MEDICATIONS STARTED DURING THIS VISIT:  Discharge Medication List as of 09/22/2017 12:10 PM       Note:  This document was prepared using Dragon voice recognition software and may include unintentional dictation errors.      Merrily Brittle, MD 09/23/17 2215

## 2017-09-22 NOTE — Discharge Instructions (Signed)
If your wound starts bleeding again please put direct pressure on it for at least 1 hour before removing and taking a look.  Return to the emergency department for any concerns whatsoever.  It was a pleasure to take care of you today, and thank you for coming to our emergency department.  If you have any questions or concerns before leaving please ask the nurse to grab me and I'm more than happy to go through your aftercare instructions again.  If you were prescribed any opioid pain medication today such as Norco, Vicodin, Percocet, morphine, hydrocodone, or oxycodone please make sure you do not drive when you are taking this medication as it can alter your ability to drive safely.  If you have any concerns once you are home that you are not improving or are in fact getting worse before you can make it to your follow-up appointment, please do not hesitate to call 911 and come back for further evaluation.  Merrily Brittle, MD

## 2017-09-22 NOTE — ED Notes (Signed)
MD Rifenbark in triage room at this time to observe wound.

## 2017-09-22 NOTE — Progress Notes (Signed)
Zoe Robinson, Zoe Robinson ([id number]) Visit Report for 09/21/2017 Debridement Details Patient Name: Zoe Robinson, Zoe Robinson Date of Service: 09/21/2017 2:00 PM Medical Record Number: [id number] Patient Account Number: [id number] Date of Birth/Sex: 10/05/1931 (84 y.o. Female) Treating RN: Huel Coventry Primary Care Provider: Gabriel Cirri Other Clinician: Referring Provider: Gabriel Cirri Treating Provider/Extender: Maxwell Caul Weeks in Treatment: 2 Debridement Performed for Wound #1 Left,Medial Lower Leg Assessment: Performed By: Physician Maxwell Caul, MD Debridement: Debridement Severity of Tissue Pre Fat layer exposed Debridement: Pre-procedure Verification/Time Yes - 14:32 Out Taken: Start Time: 14:32 Pain Control: Other : liocanine 4% Level: Skin/Subcutaneous Tissue Total Area Debrided (L x W): 3 (cm) x 1.7 (cm) = 5.1 (cm) Tissue and other material Viable, Non-Viable, Subcutaneous debrided: Instrument: Curette Bleeding: Moderate Hemostasis Achieved: Pressure End Time: 14:33 Procedural Pain: 3 Post Procedural Pain: 3 Response to Treatment: Procedure was tolerated well Post Debridement Measurements of Total Wound Length: (cm) 3 Width: (cm) 1.7 Depth: (cm) 0.3 Volume: (cm) 1.202 Character of Wound/Ulcer Post Debridement: Requires Further Debridement Severity of Tissue Post Debridement: Fat layer exposed Post Procedure Diagnosis Same as Pre-procedure Electronic Signature(s) Signed: 09/21/2017 4:57:19 PM By: Elliot Gurney, BSN, RN, CWS, Kim RN, BSN Signed: 09/21/2017 5:07:14 PM By: Baltazar Najjar MD Entered By: Baltazar Najjar on 09/21/2017 14:44:59 Zoe Robinson ([id number]) -------------------------------------------------------------------------------- HPI Details Patient Name: Zoe Robinson Date of Service: 09/21/2017 2:00 PM Medical Record Number: [id number] Patient Account Number: [id number] Date of Birth/Sex: [birth year] (84 y.o. Female) Treating RN: Huel Coventry Primary Care Provider: Gabriel Cirri Other Clinician: Referring Provider: Gabriel Cirri Treating Provider/Extender: Maxwell Caul Weeks in Treatment: 2 History of Present Illness HPI Description: 09/07/17; this is an 84 year old woman who arrives accompanied by her daughter. She has a history of severe rheumatoid arthritis followed by Dr. Corliss Skains in [place]. By review of care everywhere it appears that she had cellulitis of her left lower leg in September 2018. This may have been caused by a dog scratch. She was followed and her primary care office at [facility] affiliated group. She received antibiotics, Silvadene cream as well as Una boots. This was followed through late October and the wound was healed out on 10/31. The patient and her daughters state that the current wound started with a scissor injury to the medial left calf before the wound was actually healed out in other words this is a different wound area. The original wound was more medial on the left anterior tibial area. Since then they've been using Silvadene cream and apparently she changed doctors who recommended Polysporin and they've been referred here for the injury on her left medial calf. She does not have a history of rheumatoid-related skin issues/vasculitis The patient is a type II diabetic with most recent hemoglobin A1c of 6. She has advanced seropositive rheumatoid arthritis on prednisone 10 mg alternating with 5 and methotrexate. I see she is also on Serrato while I'm not really sure why. She has significant osteoporosis, history of hypercalcemia. ABI in this clinic is 1.08. She was a former smoker 09/14/17; patient's wound slightly smaller today however still covered in a very thick adherent necrotic surface. We have been using Santyl under compression. We finally got home health to start yesterday "[facility]" 09/21/17; not too much change in the area here. We have been using Santyl under compression and I'll  continue this after debridement today. Consider Iodoflex if we don't get a better looking wound surface Electronic Signature(s) Signed: 09/21/2017 5:07:14 PM By: Baltazar Najjar MD Entered By: Baltazar Najjar on 09/21/2017 14:45:56  Zoe Robinson, Zoe Robinson ([id number]) -------------------------------------------------------------------------------- Physical Exam Details Patient Name: Zoe Robinson, Zoe Robinson Date of Service: 09/21/2017 2:00 PM Medical Record Number: [id number] Patient Account Number: [id number] Date of Birth/Sex: May 20, 1932 (84 y.o. Female) Treating RN: Huel Coventry Primary Care Provider: Gabriel Cirri Other Clinician: Referring Provider: Gabriel Cirri Treating Provider/Extender: Maxwell Caul Weeks in Treatment: 2 Constitutional Sitting or standing Blood Pressure is within target range for patient.. Pulse regular and within target range for patient.Marland Kitchen Respirations regular, non-labored and within target range.. Temperature is normal and within the target range for the patient.Marland Kitchen appears in no distress. Notes Wound exam; left medial calf. Dimensions look roughly the same. Still covered in a necrotic nonviable surface. Easier debridement however I'm still not down to what I would consider completely viable surface. Using a #5 curet the area was aggressively debrided. We will continue Santyl under compression Electronic Signature(s) Signed: 09/21/2017 5:07:14 PM By: Baltazar Najjar MD Entered By: Baltazar Najjar on 09/21/2017 14:46:59 Zoe Robinson ([id number]) -------------------------------------------------------------------------------- Physician Orders Details Patient Name: Zoe Robinson Date of Service: 09/21/2017 2:00 PM Medical Record Number: [id number] Patient Account Number: [id number] Date of Birth/Sex: 04-25-1932 (84 y.o. Female) Treating RN: Huel Coventry Primary Care Provider: Gabriel Cirri Other Clinician: Referring Provider: Gabriel Cirri Treating  Provider/Extender: Altamese [place] in Treatment: 2 Verbal / Phone Orders: No Diagnosis Coding Wound Cleansing Wound #1 Left,Medial Lower Leg o Clean wound with Normal Saline. o May Shower, gently pat wound dry prior to applying new dressing. Anesthetic (add to Medication List) Wound #1 Left,Medial Lower Leg o Topical Lidocaine 4% cream applied to wound bed prior to debridement (In Clinic Only). Primary Wound Dressing Wound #1 Left,Medial Lower Leg o Santyl Ointment Secondary Dressing Wound #1 Left,Medial Lower Leg o ABD pad Dressing Change Frequency Wound #1 Left,Medial Lower Leg o Change Dressing Monday, Wednesday, Friday - HHRN to change dressing on Mondays and Fridays - patient will come to [facility] on Wednesdays Follow-up Appointments Wound #1 Left,Medial Lower Leg o Return Appointment in 1 week. Edema Control Wound #1 Left,Medial Lower Leg o 3 Layer Compression System - Left Lower Extremity - may anchor with unna paste if needed o Patient to wear own compression stockings - Please continue to wear your compression hose on your right lower leg o Elevate legs to the level of the heart and pump ankles as often as possible Additional Orders / Instructions Wound #1 Left,Medial Lower Leg o Vitamin A; Vitamin C, Zinc - Please add these over the counter supplements - or a multivitamin tablet that contains all 3 o Increase protein intake. o Activity as tolerated Home Health Zoe Robinson, Zoe Robinson ([id number]) Wound #1 Left,Medial Lower Leg o Continue Home [facility] Visits - [facility] o Home Health Nurse may visit PRN to address patientos wound care needs. o FACE TO FACE ENCOUNTER: MEDICARE and MEDICAID PATIENTS: I certify that this patient is under my care and that I had a face-to-face encounter that meets the physician face-to-face encounter requirements with this patient on this date. The encounter with the patient was  in whole or in part for the following MEDICAL CONDITION: (primary reason for Home Healthcare) MEDICAL NECESSITY: I certify, that based on my findings, NURSING services are a medically necessary home health service. HOME BOUND STATUS: I certify that my clinical findings support that this patient is homebound (i.e., Due to illness or injury, pt requires aid of supportive devices such as crutches, cane, wheelchairs, walkers, the use of special transportation or the assistance of another person to leave  their place of residence. There is a normal inability to leave the home and doing so requires considerable and taxing effort. Other absences are for medical reasons / religious services and are infrequent or of short duration when for other reasons). o If current dressing causes regression in wound condition, may D/C ordered dressing product/s and apply Normal Saline Moist Dressing daily until next Wound Healing Center / Other MD appointment. Notify Wound Healing Center of regression in wound condition at [phone]. o Please direct any NON-WOUND related issues/requests for orders to patient's Primary Care Physician Medications-please add to medication list. Wound #1 Left,Medial Lower Leg o Santyl Enzymatic Ointment Electronic Signature(s) Signed: 09/21/2017 4:57:19 PM By: Elliot Gurney, BSN, RN, CWS, Kim RN, BSN Signed: 09/21/2017 5:07:14 PM By: Baltazar Najjar MD Entered By: Elliot Gurney, BSN, RN, CWS, Kim on 09/21/2017 14:33:57 Zoe Robinson ([id number]) -------------------------------------------------------------------------------- Problem List Details Patient Name: Zoe Robinson Date of Service: 09/21/2017 2:00 PM Medical Record Number: [id number] Patient Account Number: [id number] Date of Birth/Sex: [birth year] (85 y.o. 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 09/07/2017 Yes I87.312 Chronic venous hypertension (idiopathic) with ulcer of left lower 09/07/2017 Yes extremity M05.80 Other rheumatoid arthritis with rheumatoid factor of unspecified site 09/07/2017 Yes Inactive Problems Resolved Problems Electronic Signature(s) Signed: 09/21/2017 5:07:14 PM By: Baltazar Najjar MD Entered By: Baltazar Najjar on 09/21/2017 14:44:36 Zoe Robinson ([id number]) -------------------------------------------------------------------------------- Progress Note Details Patient Name: Zoe Robinson Date of Service: 09/21/2017 2:00 PM Medical Record Number: [id number] Patient Account Number: [id number] Date of Birth/Sex: 1932-04-26 (85 y.o. Female) Treating RN: Huel Coventry Primary Care Provider: Gabriel Cirri Other Clinician: Referring Provider: Gabriel Cirri Treating Provider/Extender: Maxwell Caul Weeks in Treatment: 2 Subjective History of Present Illness (HPI) 09/07/17; this is an 84 year old woman who arrives accompanied by her daughter. She has a history of severe rheumatoid arthritis followed by Dr. Corliss Skains in [place]. By review of care everywhere it appears that she had cellulitis of her left lower leg in September 2018. This may have been caused by a dog scratch. She was followed and her primary care office at [facility] affiliated group. She received antibiotics, Silvadene cream as well as Una boots. This was followed through late October and the wound was healed out on 10/31. The patient and her daughters state that the current wound started with a scissor injury to the medial left calf before the wound was actually healed out in other words this is a different wound area. The original wound was more medial on the left anterior tibial area. Since then they've been using  Silvadene cream and apparently she changed doctors who recommended Polysporin and they've been referred here for the injury on her left medial calf. She does not have a history of rheumatoid-related skin issues/vasculitis The patient is a type II diabetic with most recent hemoglobin A1c of 6. She has advanced seropositive rheumatoid arthritis on prednisone 10 mg alternating with 5 and methotrexate. I see she is also on Serrato while I'm not really sure why. She has significant osteoporosis, history of hypercalcemia. ABI in this clinic is 1.08. She was a former smoker 09/14/17; patient's wound slightly smaller today however still covered in a very thick adherent necrotic surface. We have been using Santyl under compression. We finally got home health to start yesterday "[facility]" 09/21/17; not  too much change in the area here. We have been using Santyl under compression and I'll continue this after debridement today. Consider Iodoflex if we don't get a better looking wound surface Objective Constitutional Sitting or standing Blood Pressure is within target range for patient.. Pulse regular and within target range for patient.Marland Kitchen Respirations regular, non-labored and within target range.. Temperature is normal and within the target range for the patient.Marland Kitchen appears in no distress. Vitals Time Taken: 2:14 PM, Height: 59 in, Weight: 117 lbs, BMI: 23.6, Temperature: 97.7 F, Pulse: 78 bpm, Respiratory Rate: 16 breaths/min, Blood Pressure: 118/50 mmHg. General Notes: Wound exam; left medial calf. Dimensions look roughly the same. Still covered in a necrotic nonviable surface. Easier debridement however I'm still not down to what I would consider completely viable surface. Using a #5 curet the area was aggressively debrided. We will continue Santyl under compression Integumentary (Hair, Skin) Wound #1 status is Open. Original cause of wound was Trauma. The wound is located on the Left,Medial Lower Leg.  The Zoe Robinson, Zoe Robinson ([id number]) wound measures 3cm length x 1.7cm width x 0.2cm depth; 4.006cm^2 area and 0.801cm^3 volume. There is Fat Layer (Subcutaneous Tissue) Exposed exposed. There is no tunneling or undermining noted. There is a large amount of serous drainage noted. The wound margin is flat and intact. There is medium (34-66%) red, hyper - granulation within the wound bed. There is a medium (34-66%) amount of necrotic tissue within the wound bed including Adherent Slough. The periwound skin appearance did not exhibit: Callus, Crepitus, Excoriation, Induration, Rash, Scarring, Dry/Scaly, Maceration, Atrophie Blanche, Cyanosis, Ecchymosis, Hemosiderin Staining, Mottled, Pallor, Rubor, Erythema. Periwound temperature was noted as No Abnormality. The periwound has tenderness on palpation. Assessment Active Problems ICD-10 L97.223 - 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 muscle I87.312 - Chronic venous hypertension (idiopathic) with ulcer of left lower extremity M05.80 - Other rheumatoid arthritis with rheumatoid factor of unspecified site Procedures Wound #1 Pre-procedure diagnosis of Wound #1 is a Venous Leg Ulcer located on the Left,Medial Lower Leg .Severity of Tissue Pre Debridement is: Fat layer exposed. There was a Skin/Subcutaneous Tissue Debridement ([id number]) debridement with total area of 5.1 sq cm performed by Maxwell Caul, MD. with the following instrument(s): Curette to remove Viable and Non-Viable tissue/material including Subcutaneous after achieving pain control using Other (liocanine 4%). A time out was conducted at 14:32, prior to the start of the procedure. A Moderate amount of bleeding was controlled with Pressure. The procedure was tolerated well with a pain level of 3 throughout and a pain level of 3 following the procedure. Post Debridement Measurements: 3cm length x 1.7cm width x 0.3cm depth; 1.202cm^3 volume. Character of Wound/Ulcer  Post Debridement requires further debridement. Severity of Tissue Post Debridement is: Fat layer exposed. Post procedure Diagnosis Wound #1: Same as Pre-Procedure Plan Wound Cleansing: Wound #1 Left,Medial Lower Leg: Clean wound with Normal Saline. May Shower, gently pat wound dry prior to applying new dressing. Anesthetic (add to Medication List): Wound #1 Left,Medial Lower Leg: Topical Lidocaine 4% cream applied to wound bed prior to debridement (In Clinic Only). Primary Wound Dressing: Wound #1 Left,Medial Lower Leg: Santyl Ointment Secondary Dressing: Wound #1 Left,Medial Lower Leg: Zoe Robinson, Zoe Robinson ([id number]) ABD pad Dressing Change Frequency: Wound #1 Left,Medial Lower Leg: Change Dressing Monday, Wednesday, Friday - HHRN to change dressing on Mondays and Fridays - patient will come to [facility] on Wednesdays Follow-up Appointments: Wound #1 Left,Medial Lower Leg: Return Appointment in 1 week. Edema Control:  Wound #1 Left,Medial Lower Leg: 3 Layer Compression System - Left Lower Extremity - may anchor with unna paste if needed Patient to wear own compression stockings - Please continue to wear your compression hose on your right lower leg Elevate legs to the level of the heart and pump ankles as often as possible Additional Orders / Instructions: Wound #1 Left,Medial Lower Leg: Vitamin A; Vitamin C, Zinc - Please add these over the counter supplements - or a multivitamin tablet that contains all 3 Increase protein intake. Activity as tolerated Home Health: Wound #1 Left,Medial Lower Leg: Continue Home Health Visits - [facility] Health Nurse may visit PRN to address patient s wound care needs. FACE TO FACE ENCOUNTER: MEDICARE and MEDICAID PATIENTS: I certify that this patient is under my care and that I had a face-to-face encounter that meets the physician face-to-face encounter requirements with this patient on this date. The encounter  with the patient was in whole or in part for the following MEDICAL CONDITION: (primary reason for Home Healthcare) MEDICAL NECESSITY: I certify, that based on my findings, NURSING services are a medically necessary home health service. HOME BOUND STATUS: I certify that my clinical findings support that this patient is homebound (i.e., Due to illness or injury, pt requires aid of supportive devices such as crutches, cane, wheelchairs, walkers, the use of special transportation or the assistance of another person to leave their place of residence. There is a normal inability to leave the home and doing so requires considerable and taxing effort. Other absences are for medical reasons / religious services and are infrequent or of short duration when for other reasons). If current dressing causes regression in wound condition, may D/C ordered dressing product/s and apply Normal Saline Moist Dressing daily until next Wound Healing Center / Other MD appointment. Notify Wound Healing Center of regression in wound condition at [phone]. Please direct any NON-WOUND related issues/requests for orders to patient's Primary Care Physician Medications-please add to medication list.: Wound #1 Left,Medial Lower Leg: Santyl Enzymatic Ointment 1 continue santyl under compression 2 consider iodoflex if there is not more improvement soon Electronic Signature(s) Signed: 09/21/2017 5:07:14 PM By: Baltazar Najjar MD Entered By: Baltazar Najjar on 09/21/2017 14:47:50 Zoe Robinson ([id number]) -------------------------------------------------------------------------------- SuperBill Details Patient Name: Zoe Robinson Date of Service: 09/21/2017 Medical Record Number: [id number] Patient Account Number: [id number] Date of Birth/Sex: Jan 25, 1932 (85 y.o. 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

## 2017-09-22 NOTE — ED Notes (Signed)
MD Rifenbark at bedside for discharge instructions, pt and family verbalize d/c understanding and are comfortable with discharge plans. PT in NAD. Bleeding is controlled, and coband applied

## 2017-09-22 NOTE — ED Notes (Signed)
PT unabl e to sign due to no topaz in triage room

## 2017-09-22 NOTE — ED Triage Notes (Signed)
Pt to ED sent by wound clinic to be evaluated for wound to LFT lower extremety. Pt was seen yesterday by Dr. Leanord Hawking in [place] and scraped wound, per family pt wound has been bleeding throughout the night. Pt is on xeralto. VS stable

## 2017-09-22 NOTE — Progress Notes (Signed)
DUSTINA, SCOGGIN ([id number]) Visit Report for 09/21/2017 Arrival Information Details Patient Name: Zoe Robinson, Zoe Robinson Date of Service: 09/21/2017 2:00 PM Medical Record Number: [id number] Patient Account Number: [id number] Date of Birth/Sex: 1932/01/22 (85 y.o. Female) Treating RN: Ashok Cordia, Debi Primary Care Ayauna Mcnay: Gabriel Cirri Other Clinician: Referring Raydell Maners: Gabriel Cirri Treating Larkin Alfred/Extender: Altamese [place] in Treatment: 2 Visit Information History Since Last Visit Added or deleted any medications: No Patient Arrived: Ambulatory Any new allergies or adverse reactions: No Arrival Time: 14:13 Had a fall or experienced change in No Accompanied By: daughter activities of daily living that may affect Transfer Assistance: None risk of falls: Patient Identification Verified: Yes Signs or symptoms of abuse/neglect since last visito No Secondary Verification Process Yes Hospitalized since last visit: No Completed: Has Dressing in Place as Prescribed: Yes Patient Has Alerts: Yes Has Compression in Place as Prescribed: Yes Patient Alerts: Patient on Blood Pain Present Now: No Thinner xarelto Electronic Signature(s) Signed: 09/21/2017 4:30:35 PM By: Alejandro Mulling Entered By: Alejandro Mulling on 09/21/2017 14:14:15 Zoe Robinson ([id number]) -------------------------------------------------------------------------------- Encounter Discharge Information Details Patient Name: Zoe Robinson Date of Service: 09/21/2017 2:00 PM Medical Record Number: [id number] Patient Account Number: [id number] Date of Birth/Sex: 1932-07-08 (85 y.o. Female) Treating RN: Huel Coventry Primary Care Tanza Pellot: Gabriel Cirri Other Clinician: Referring Rawlin Reaume: Gabriel Cirri Treating Anis Cinelli/Extender: Altamese [place] in Treatment: 2 Encounter Discharge Information Items Discharge Pain Level: 0 Discharge Condition: Stable Ambulatory Status:  Ambulatory Discharge Destination: Home Transportation: Private Auto Accompanied By: dtr Schedule Follow-up Appointment: No Medication Reconciliation completed and No provided to Patient/Care Brigett Estell: Provided on Clinical Summary of Care: 09/21/2017 Form Type Recipient Paper Patient JK Electronic Signature(s) Signed: 09/21/2017 4:34:19 PM By: Curtis Sites Entered By: Curtis Sites on 09/21/2017 16:34:19 Zoe Robinson ([id number]) -------------------------------------------------------------------------------- Lower Extremity Assessment Details Patient Name: Zoe Robinson Date of Service: 09/21/2017 2:00 PM Medical Record Number: [id number] Patient Account Number: [id number] Date of Birth/Sex: [birth year] (85 y.o. Female) Treating RN: Ashok Cordia, Debi Primary Care Philippe Gang: Gabriel Cirri Other Clinician: Referring Eldean Nanna: Gabriel Cirri Treating Hopie Pellegrin/Extender: Maxwell Caul Weeks in Treatment: 2 Edema Assessment Assessed: [Left: No] [Right: No] [Left: Edema] [Right: :] Calf Left: Right: Point of Measurement: 29 cm From Medial Instep 30.4 cm cm Ankle Left: Right: Point of Measurement: 10 cm From Medial Instep 17.6 cm cm Vascular Assessment Pulses: Dorsalis Pedis Palpable: [Left:Yes] Posterior Tibial Extremity colors, hair growth, and conditions: Extremity Color: [Left:Mottled] Temperature of Extremity: [Left:Warm] Capillary Refill: [Left:> 3 seconds] Toe Nail Assessment Left: Right: Thick: Yes Discolored: Yes Deformed: Yes Improper Length and Hygiene: Yes Electronic Signature(s) Signed: 09/21/2017 4:30:35 PM By: Alejandro Mulling Entered By: Alejandro Mulling on 09/21/2017 14:22:12 Zoe Robinson ([id number]) -------------------------------------------------------------------------------- Multi Wound Chart Details Patient Name: Zoe Robinson Date of Service: 09/21/2017 2:00 PM Medical Record Number: [id number] Patient Account Number:  [id number] Date of Birth/Sex: May 23, 1932 (85 y.o. Female) Treating RN: Huel Coventry Primary Care Ayianna Darnold: Gabriel Cirri Other Clinician: Referring Kalasia Crafton: Gabriel Cirri Treating Raymond Bhardwaj/Extender: Maxwell Caul Weeks in Treatment: 2 Vital Signs Height(in): 59 Pulse(bpm): 78 Weight(lbs): 117 Blood Pressure(mmHg): 118/50 Body Mass Index(BMI): 24 Temperature(F): 97.7 Respiratory Rate 16 (breaths/min): Photos: [1:No Photos] [N/A:N/A] Wound Location: [1:Left Lower Leg - Medial] [N/A:N/A] Wounding Event: [1:Trauma] [N/A:N/A] Primary Etiology: [1:Venous Leg Ulcer] [N/A:N/A] Comorbid History: [1:Cataracts, Anemia, Arrhythmia, Hypertension, Rheumatoid Arthritis, Osteoarthritis] [N/A:N/A] Date Acquired: [1:05/02/2017] [N/A:N/A] Weeks of Treatment: [1:2] [N/A:N/A] Wound Status: [1:Open] [N/A:N/A] Measurements L x W x D [1:3x1.7x0.2] [N/A:N/A] (cm) Area (cm) : [1:4.006] [N/A:N/A] Volume (cm) : [1:0.801] [N/A:N/A] % Reduction in  Area: [1:47.00%] [N/A:N/A] % Reduction in Volume: [1:-6.00%] [N/A:N/A] Classification: [1:Full Thickness Without Exposed Support Structures] [N/A:N/A] Exudate Amount: [1:Large] [N/A:N/A] Exudate Type: [1:Serous] [N/A:N/A] Exudate Color: [1:amber] [N/A:N/A] Wound Margin: [1:Flat and Intact] [N/A:N/A] Granulation Amount: [1:Medium (34-66%)] [N/A:N/A] Granulation Quality: [1:Red, Hyper-granulation] [N/A:N/A] Necrotic Amount: [1:Medium (34-66%)] [N/A:N/A] Exposed Structures: [1:Fat Layer (Subcutaneous Tissue) Exposed: Yes Fascia: No Tendon: No Muscle: No Joint: No Bone: No] [N/A:N/A] Epithelialization: [1:Small (1-33%)] [N/A:N/A] Debridement: [1:Debridement (11042-11047)] [N/A:N/A] Pre-procedure [1:14:32] [N/A:N/A] Verification/Time Out Taken: TAE, PITCAIRN ([id number]) Pain Control: Other N/A N/A Tissue Debrided: Subcutaneous N/A N/A Level: Skin/Subcutaneous Tissue N/A N/A Debridement Area (sq cm): 5.1 N/A N/A Instrument: Curette N/A  N/A Bleeding: Moderate N/A N/A Hemostasis Achieved: Pressure N/A N/A Procedural Pain: 3 N/A N/A Post Procedural Pain: 3 N/A N/A Debridement Treatment Procedure was tolerated well N/A N/A Response: Post Debridement 3x1.7x0.3 N/A N/A Measurements L x W x D (cm) Post Debridement Volume: 1.202 N/A N/A (cm) Periwound Skin Texture: Excoriation: No N/A N/A Induration: No Callus: No Crepitus: No Rash: No Scarring: No Periwound Skin Moisture: Maceration: No N/A N/A Dry/Scaly: No Periwound Skin Color: Atrophie Blanche: No N/A N/A Cyanosis: No Ecchymosis: No Erythema: No Hemosiderin Staining: No Mottled: No Pallor: No Rubor: No Temperature: No Abnormality N/A N/A Tenderness on Palpation: Yes N/A N/A Wound Preparation: Ulcer Cleansing: N/A N/A Rinsed/Irrigated with Saline Topical Anesthetic Applied: Other: lidocaine 4% Procedures Performed: Debridement N/A N/A Treatment Notes Electronic Signature(s) Signed: 09/21/2017 5:07:14 PM By: Baltazar Najjar MD Entered By: Baltazar Najjar on 09/21/2017 14:44:46 Zoe Robinson ([id number]) -------------------------------------------------------------------------------- Multi-Disciplinary Care Plan Details Patient Name: Zoe Robinson Date of Service: 09/21/2017 2:00 PM Medical Record Number: [id number] Patient Account Number: [id number] Date of Birth/Sex: [birth year] (85 y.o. Female) Treating RN: Huel Coventry Primary Care Johnell Bas: Gabriel Cirri Other Clinician: Referring Jinelle Butchko: Gabriel Cirri Treating Jailen Coward/Extender: Altamese [place] in Treatment: 2 Active Inactive ` Abuse / Safety / Falls / Self Care Management Nursing Diagnoses: Potential for falls Goals: Patient will not experience any injury related to falls Date Initiated: 09/07/2017 Target Resolution Date: 12/03/2017 Goal Status: Active Interventions: Assess fall risk on admission and as needed Notes: ` Orientation to the Wound Care Program Nursing  Diagnoses: Knowledge deficit related to the wound healing center program Goals: Patient/caregiver will verbalize understanding of the Wound Healing Center Program Date Initiated: 09/07/2017 Target Resolution Date: 12/03/2017 Goal Status: Active Interventions: Provide education on orientation to the wound center Notes: ` Wound/Skin Impairment Nursing Diagnoses: Impaired tissue integrity Goals: Ulcer/skin breakdown will heal within 14 weeks Date Initiated: 09/07/2017 Target Resolution Date: 12/03/2017 Goal Status: Active Interventions: PRISILLA, KESINGER ([id number]) Assess patient/caregiver ability to obtain necessary supplies Assess patient/caregiver ability to perform ulcer/skin care regimen upon admission and as needed Assess ulceration(s) every visit Notes: Electronic Signature(s) Signed: 09/21/2017 4:57:19 PM By: Elliot Gurney, BSN, RN, CWS, Kim RN, BSN Entered By: Elliot Gurney, BSN, RN, CWS, Kim on 09/21/2017 14:32:17 Zoe Robinson ([id number]) -------------------------------------------------------------------------------- Pain Assessment Details Patient Name: Zoe Robinson Date of Service: 09/21/2017 2:00 PM Medical Record Number: [id number] Patient Account Number: [id number] Date of Birth/Sex: 1931-08-22 (85 y.o. Female) Treating RN: Ashok Cordia, Debi Primary Care Adante Courington: Gabriel Cirri Other Clinician: Referring Angla Delahunt: Gabriel Cirri Treating Keiasia Christianson/Extender: Maxwell Caul Weeks in Treatment: 2 Active Problems Location of Pain Severity and Description of Pain Patient Has Paino No Site Locations Pain Management and Medication Current Pain Management: Electronic Signature(s) Signed: 09/21/2017 4:30:35 PM By: Alejandro Mulling Entered By: Alejandro Mulling on 09/21/2017 14:14:21 Zoe Robinson ([id number]) -------------------------------------------------------------------------------- Patient/Caregiver Education Details Patient Name: Zoe Robinson Date  of  Service: 09/21/2017 2:00 PM Medical Record Number: [id number] Patient Account Number: [id number] Date of Birth/Gender: [birth year] (85 y.o. Female) Treating RN: Curtis Sites Primary Care Physician: Gabriel Cirri Other Clinician: Referring Physician: Gabriel Cirri Treating Physician/Extender: Altamese [place] in Treatment: 2 Education Assessment Education Provided To: Patient and Caregiver Education Topics Provided Venous: Handouts: Other: leg elevation Methods: Explain/Verbal Responses: State content correctly Electronic Signature(s) Signed: 09/21/2017 4:38:39 PM By: Curtis Sites Entered By: Curtis Sites on 09/21/2017 16:34:40 Zoe Robinson ([id number]) -------------------------------------------------------------------------------- Wound Assessment Details Patient Name: Zoe Robinson Date of Service: 09/21/2017 2:00 PM Medical Record Number: [id number] Patient Account Number: [id number] Date of Birth/Sex: March 03, 1932 (85 y.o. Female) Treating RN: Ashok Cordia, Debi Primary Care Aryka Coonradt: Gabriel Cirri Other Clinician: Referring Yazaira Speas: Gabriel Cirri Treating Samie Barclift/Extender: Maxwell Caul Weeks in Treatment: 2 Wound Status Wound Number: 1 Primary Venous Leg Ulcer Etiology: Wound Location: Left Lower Leg - Medial Wound Open Wounding Event: Trauma Status: Date Acquired: 05/02/2017 Comorbid Cataracts, Anemia, Arrhythmia, Hypertension, Weeks Of Treatment: 2 History: Rheumatoid Arthritis, Osteoarthritis Clustered Wound: No Photos Photo Uploaded By: Alejandro Mulling on 09/21/2017 16:29:08 Wound Measurements Length: (cm) 3 Width: (cm) 1.7 Depth: (cm) 0.2 Area: (cm) 4.006 Volume: (cm) 0.801 % Reduction in Area: 47% % Reduction in Volume: -6% Epithelialization: Small (1-33%) Tunneling: No Undermining: No Wound Description Full Thickness Without Exposed Support Classification: Structures Wound Margin: Flat and  Intact Exudate Large Amount: Exudate Type: Serous Exudate Color: amber Foul Odor After Cleansing: No Slough/Fibrino Yes Wound Bed Granulation Amount: Medium (34-66%) Exposed Structure Granulation Quality: Red, Hyper-granulation Fascia Exposed: No Necrotic Amount: Medium (34-66%) Fat Layer (Subcutaneous Tissue) Exposed: Yes Necrotic Quality: Adherent Slough Tendon Exposed: No Muscle Exposed: No Joint Exposed: No Bone Exposed: No Bruhl, Samanthamarie ([id number]) Periwound Skin Texture Texture Color No Abnormalities Noted: No No Abnormalities Noted: No Callus: No Atrophie Blanche: No Crepitus: No Cyanosis: No Excoriation: No Ecchymosis: No Induration: No Erythema: No Rash: No Hemosiderin Staining: No Scarring: No Mottled: No Pallor: No Moisture Rubor: No No Abnormalities Noted: No Dry / Scaly: No Temperature / Pain Maceration: No Temperature: No Abnormality Tenderness on Palpation: Yes Wound Preparation Ulcer Cleansing: Rinsed/Irrigated with Saline Topical Anesthetic Applied: Other: lidocaine 4%, Electronic Signature(s) Signed: 09/21/2017 4:30:35 PM By: Alejandro Mulling Entered By: Alejandro Mulling on 09/21/2017 14:25:07 Zoe Robinson ([id number]) -------------------------------------------------------------------------------- Vitals Details Patient Name: Zoe Robinson Date of Service: 09/21/2017 2:00 PM Medical Record Number: [id number] Patient Account Number: [id number] Date of Birth/Sex: 08/13/1931 (85 y.o. Female) Treating RN: Ashok Cordia, Debi Primary Care Jalana Moore: Gabriel Cirri Other Clinician: Referring Brittanya Winburn: Gabriel Cirri Treating Xela Oregel/Extender: Maxwell Caul Weeks in Treatment: 2 Vital Signs Time Taken: 14:14 Temperature (F): 97.7 Height (in): 59 Pulse (bpm): 78 Weight (lbs): 117 Respiratory Rate (breaths/min): 16 Body Mass Index (BMI): 23.6 Blood Pressure (mmHg): 118/50 Reference Range: 80 - 120 mg / dl Electronic  Signature(s) Signed: 09/21/2017 4:30:35 PM By: Alejandro Mulling Entered By: Alejandro Mulling on 09/21/2017 14:17:14

## 2017-09-24 NOTE — Progress Notes (Signed)
Zoe Robinson, Zoe Robinson ([id number]) Visit Report for 09/22/2017 Arrival Information Details Patient Name: Zoe Robinson Date of Service: 09/22/2017 10:15 AM Medical Record Number: [id number] Patient Account Number: Zoe Robinson Date of Birth/Sex: [birth year] (84 y.o. Female) Treating RN: Curtis Sites Primary Care Vernard Gram: Gabriel Cirri Other Clinician: Referring Yuritzy Zehring: Gabriel Cirri Treating Fabian Walder/Extender: Kathreen Cosier in Treatment: 2 Visit Information History Since Last Visit Added or deleted any medications: No Patient Arrived: Wheel Chair Any new allergies or adverse reactions: No Arrival Time: 10:34 Had a fall or experienced change in No Accompanied By: dtr activities of daily living that may affect Transfer Assistance: None risk of falls: Patient Identification Verified: Yes Signs or symptoms of abuse/neglect since last visito No Secondary Verification Process Yes Hospitalized since last visit: No Completed: Has Dressing in Place as Prescribed: Yes Patient Has Alerts: Yes Has Compression in Place as Prescribed: Yes Patient Alerts: Patient on Blood Pain Present Now: Yes Thinner xarelto Electronic Signature(s) Signed: 09/22/2017 4:10:15 PM By: Curtis Sites Entered By: Curtis Sites on 09/22/2017 10:34:42 Zoe Robinson ([id number]) -------------------------------------------------------------------------------- Clinic Level of Care Assessment Details Patient Name: Zoe Robinson Date of Service: 09/22/2017 10:15 AM Medical Record Number: [id number] Patient Account Number: Zoe Robinson Date of Birth/Sex: 08-Sep-1931 (84 y.o. Female) Treating RN: Ashok Cordia, Debi Primary Care Sparkles Mcneely: Gabriel Cirri Other Clinician: Referring Miriam Liles: Gabriel Cirri Treating Daemian Gahm/Extender: Kathreen Cosier in Treatment: 2 Clinic Level of Care Assessment Items TOOL 4 Quantity Score X - Use when only an EandM is performed on FOLLOW-UP visit 1 0 ASSESSMENTS -  Nursing Assessment / Reassessment X - Reassessment of Co-morbidities (includes updates in patient status) 1 10 X- 1 5 Reassessment of Adherence to Treatment Plan ASSESSMENTS - Wound and Skin Assessment / Reassessment X - Simple Wound Assessment / Reassessment - one wound 1 5 []  - 0 Complex Wound Assessment / Reassessment - multiple wounds []  - 0 Dermatologic / Skin Assessment (not related to wound area) ASSESSMENTS - Focused Assessment []  - Circumferential Edema Measurements - multi extremities 0 []  - 0 Nutritional Assessment / Counseling / Intervention []  - 0 Lower Extremity Assessment (monofilament, tuning fork, pulses) []  - 0 Peripheral Arterial Disease Assessment (using hand held doppler) ASSESSMENTS - Ostomy and/or Continence Assessment and Care []  - Incontinence Assessment and Management 0 []  - 0 Ostomy Care Assessment and Management (repouching, etc.) PROCESS - Coordination of Care X - Simple Patient / Family Education for ongoing care 1 15 []  - 0 Complex (extensive) Patient / Family Education for ongoing care []  - 0 Staff obtains Chiropractor, Records, Test Results / Process Orders []  - 0 Staff telephones HHA, Nursing Homes / Clarify orders / etc []  - 0 Routine Transfer to another Facility (non-emergent condition) X- 1 10 Routine Hospital Admission (non-emergent condition) []  - 0 New Admissions / Manufacturing engineer / Ordering NPWT, Apligraf, etc. []  - 0 Emergency Hospital Admission (emergent condition) X- 1 10 Simple Discharge Coordination Zoe Robinson, Zoe Robinson ([id number]) []  - 0 Complex (extensive) Discharge Coordination PROCESS - Special Needs []  - Pediatric / Minor Patient Management 0 []  - 0 Isolation Patient Management []  - 0 Hearing / Language / Visual special needs []  - 0 Assessment of Community assistance (transportation, D/C planning, etc.) []  - 0 Additional assistance / Altered mentation []  - 0 Support Surface(s) Assessment (bed, cushion, seat,  etc.) INTERVENTIONS - Wound Cleansing / Measurement X - Simple Wound Cleansing - one wound 1 5 []  - 0 Complex Wound Cleansing - multiple wounds X- 1 5 Wound Imaging (photographs - any number of wounds) []  -  0 Wound Tracing (instead of photographs) X- 1 5 Simple Wound Measurement - one wound []  - 0 Complex Wound Measurement - multiple wounds INTERVENTIONS - Wound Dressings X - Small Wound Dressing one or multiple wounds 1 10 []  - 0 Medium Wound Dressing one or multiple wounds []  - 0 Large Wound Dressing one or multiple wounds []  - 0 Application of Medications - topical []  - 0 Application of Medications - injection INTERVENTIONS - Miscellaneous []  - External ear exam 0 []  - 0 Specimen Collection (cultures, biopsies, blood, body fluids, etc.) []  - 0 Specimen(s) / Culture(s) sent or taken to Lab for analysis []  - 0 Patient Transfer (multiple staff / / Similar devices) []  - 0 Simple Staple / Suture removal (25 or less) []  - 0 Complex Staple / Suture removal (26 or more) []  - 0 Hypo / Hyperglycemic Management (close monitor of Blood Glucose) []  - 0 Ankle / Brachial Index (ABI) - do not check if billed separately X- 1 5 Vital Signs Zoe Robinson, Zoe Robinson ( ) Has the patient been seen at the hospital within the last three years: Yes Total Score: 85 Level Of Care: New/Established - Level 3 Electronic Signature(s) Signed: 09/22/2017 4:59:29 PM By: Entered By: on 09/22/2017 12:31:48 ( ) -------------------------------------------------------------------------------- Encounter Discharge Information Details Patient Name: Date of Service: 09/22/2017 10:15 AM Medical Record Number: Patient Account Number: Date of Birth/Sex: [birth year] (84 y.o. Female) Treating RN: [id number], Debi Primary Care Amol Domanski: 09/24/2017 Other Clinician: Referring Kailoni Vahle: Alejandro Mulling Treating Daniele Yankowski/Extender: Alejandro Mulling in Treatment: 2 Encounter Discharge Information Items Discharge Pain Level: 0 Discharge Condition: Stable Ambulatory Status: Wheelchair Emergency Discharge Destination: Room Transportation: Private Auto Accompanied By: daughter Schedule Follow-up Appointment: No Medication Reconciliation completed and No provided to Patient/Care Juanmanuel Marohl: Provided on Clinical Summary of Care: 09/22/2017 Form Type Recipient Paper Patient JK Electronic Signature(s) Signed: 09/23/2017 10:09:42 AM By: [id number] Entered By: Zoe Robinson on 09/22/2017 11:17:32 [id number] (Zoe Robinson) -------------------------------------------------------------------------------- Lower Extremity Assessment Details Patient Name: 05/16/1932 Date of Service: 09/22/2017 10:15 AM Medical Record Number: Ashok Cordia Patient Account Number: Gabriel Cirri Date of Birth/Sex: [birth year] (85 y.o. Female) Treating RN: 09/24/2017 Primary Care Trevar Boehringer: 11/23/2017 Other Clinician: Referring Marylan Glore: Gwenlyn Perking Treating Dorse Locy/Extender: Gwenlyn Perking in Treatment: 2 Edema Assessment Assessed: [Left: No] [Right: No] [Left: Edema] [Right: :] Calf Left: Right: Point of Measurement: 29 cm From Medial Instep 30.4 cm cm Ankle Left: Right: Point of Measurement: 10 cm From Medial Instep 17.6 cm cm Vascular Assessment Pulses: Dorsalis Pedis Palpable: [Left:Yes] Posterior Tibial Extremity colors, hair growth, and conditions: Extremity Color: [Left:Hyperpigmented] Hair Growth on Extremity: [Left:No] Temperature of Extremity: [Left:Cool] Capillary Refill: [Left:< 3 seconds] Electronic Signature(s) Signed: 09/22/2017 4:10:15 PM By: Zoe Robinson Entered By: [id number] on 09/22/2017 10:43:45 09/24/2017 ([id number]) -------------------------------------------------------------------------------- Multi Wound Chart Details Patient  Name: Zoe Robinson Date of Service: 09/22/2017 10:15 AM Medical Record Number: 09-04-1989 Patient Account Number: Curtis Sites Date of Birth/Sex: 26-May-1932 (85 y.o. Female) Treating RN: Kathreen Cosier Primary Care Nikaya Nasby: 09/24/2017 Other Clinician: Referring Shabree Tebbetts: Curtis Sites Treating Kelijah Towry/Extender: Curtis Sites in Treatment: 2 Vital Signs Height(in): 59 Pulse(bpm): 80 Weight(lbs): 117 Blood Pressure(mmHg): 132/70 Body Mass Index(BMI): 24 Temperature(F): 98.2 Respiratory Rate 16 (breaths/min): Photos: [N/A:N/A] Wound Location: Left Lower Leg - Medial N/A N/A Wounding Event: Trauma N/A N/A Primary Etiology: Venous Leg Ulcer N/A N/A Comorbid History: Cataracts, Anemia, N/A N/A Arrhythmia, Hypertension, Rheumatoid Arthritis, Osteoarthritis Date Acquired: 05/02/2017 N/A  N/A Weeks of Treatment: 2 N/A N/A Wound Status: Open N/A N/A Measurements L x W x D 3.2x1.8x0.4 N/A N/A (cm) Area (cm) : 4.524 N/A N/A Volume (cm) : 1.81 N/A N/A % Reduction in Area: 40.10% N/A N/A % Reduction in Volume: -139.40% N/A N/A Classification: Full Thickness Without N/A N/A Exposed Support Structures Exudate Amount: Large N/A N/A Exudate Type: Sanguinous N/A N/A Exudate Color: red N/A N/A Wound Margin: Flat and Intact N/A N/A Granulation Amount: Medium (34-66%) N/A N/A Granulation Quality: Red, Hyper-granulation N/A N/A Necrotic Amount: Medium (34-66%) N/A N/A Exposed Structures: Fat Layer (Subcutaneous N/A N/A Tissue) Exposed: Yes Fascia: No Tendon: No Zoe Robinson, Zoe Robinson ([id number]) Muscle: No Joint: No Bone: No Epithelialization: Small (1-33%) N/A N/A Periwound Skin Texture: Excoriation: Yes N/A N/A Induration: No Callus: No Crepitus: No Rash: No Scarring: No Periwound Skin Moisture: Maceration: No N/A N/A Dry/Scaly: No Periwound Skin Color: Atrophie Blanche: No N/A N/A Cyanosis: No Ecchymosis: No Erythema: No Hemosiderin Staining:  No Mottled: No Pallor: No Rubor: No Temperature: No Abnormality N/A N/A Tenderness on Palpation: Yes N/A N/A Wound Preparation: Ulcer Cleansing: Other: soap N/A N/A and water Topical Anesthetic Applied: None Assessment Notes: Patient has been experiencing N/A N/A excessive bleeding since last night. Treatment Notes Wound #1 (Left, Medial Lower Leg) 1. Cleansed with: Clean wound with Normal Saline 5. Secondary Dressing Applied Dry Gauze 7. Secured with Tape Notes Jamille Yoshino used silver nitrate Electronic Signature(s) Signed: 09/22/2017 12:07:26 PM By: Bonnell Public Entered By: Bonnell Public on 09/22/2017 12:07:26 Zoe Robinson ([id number]) -------------------------------------------------------------------------------- Multi-Disciplinary Care Plan Details Patient Name: Zoe Robinson Date of Service: 09/22/2017 10:15 AM Medical Record Number: [id number] Patient Account Number: Zoe Robinson Date of Birth/Sex: 02-Nov-1931 (85 y.o. Female) Treating RN: Ashok Cordia, Debi Primary Care Johnita Palleschi: Gabriel Cirri Other Clinician: Referring Zebastian Carico: Gabriel Cirri Treating Xaviera Flaten/Extender: Kathreen Cosier in Treatment: 2 Active Inactive ` Abuse / Safety / Falls / Self Care Management Nursing Diagnoses: Potential for falls Goals: Patient will not experience any injury related to falls Date Initiated: 09/07/2017 Target Resolution Date: 12/03/2017 Goal Status: Active Interventions: Assess fall risk on admission and as needed Notes: ` Orientation to the Wound Care Program Nursing Diagnoses: Knowledge deficit related to the wound healing center program Goals: Patient/caregiver will verbalize understanding of the Wound Healing Center Program Date Initiated: 09/07/2017 Target Resolution Date: 12/03/2017 Goal Status: Active Interventions: Provide education on orientation to the wound center Notes: ` Wound/Skin Impairment Nursing Diagnoses: Impaired tissue  integrity Goals: Ulcer/skin breakdown will heal within 14 weeks Date Initiated: 09/07/2017 Target Resolution Date: 12/03/2017 Goal Status: Active Interventions: Zoe Robinson, Zoe Robinson ([id number]) Assess patient/caregiver ability to obtain necessary supplies Assess patient/caregiver ability to perform ulcer/skin care regimen upon admission and as needed Assess ulceration(s) every visit Notes: Electronic Signature(s) Signed: 09/22/2017 4:59:29 PM By: Alejandro Mulling Entered By: Alejandro Mulling on 09/22/2017 10:48:13 Zoe Robinson ([id number]) -------------------------------------------------------------------------------- Pain Assessment Details Patient Name: Zoe Robinson Date of Service: 09/22/2017 10:15 AM Medical Record Number: [id number] Patient Account Number: Zoe Robinson Date of Birth/Sex: [birth year] (85 y.o. Female) Treating RN: Curtis Sites Primary Care Kalum Minner: Gabriel Cirri Other Clinician: Referring Ozil Stettler: Gabriel Cirri Treating Kwaku Mostafa/Extender: Kathreen Cosier in Treatment: 2 Active Problems Location of Pain Severity and Description of Pain Patient Has Paino Yes Site Locations Pain Location: Pain in Ulcers With Dressing Change: Yes Duration of the Pain. Constant / Intermittento Constant Character of Pain Describe the Pain: Burning Pain Management and Medication Current Pain Management: Electronic Signature(s) Signed: 09/22/2017 4:10:15 PM By: Curtis Sites Entered By: Curtis Sites on  09/22/2017 10:34:59 Zoe Robinson, Zoe Robinson ([id number]) -------------------------------------------------------------------------------- Patient/Caregiver Education Details Patient Name: Zoe Robinson Date of Service: 09/22/2017 10:15 AM Medical Record Number: [id number] Patient Account Number: Zoe Robinson Date of Birth/Gender: [birth year] (85 y.o. Female) Treating RN: Ashok Cordia, Debi Primary Care Physician: Gabriel Cirri Other Clinician: Referring Physician:  Gabriel Cirri Treating Physician/Extender: Kathreen Cosier in Treatment: 2 Education Assessment Education Provided To: Patient and Caregiver daughter Education Topics Provided Wound/Skin Impairment: Handouts: Caring for Your Ulcer, Other: go to er r/t bleeding Methods: Demonstration, Explain/Verbal Responses: State content correctly Electronic Signature(s) Signed: 09/22/2017 4:59:29 PM By: Alejandro Mulling Entered By: Alejandro Mulling on 09/22/2017 11:16:28 Zoe Robinson ([id number]) -------------------------------------------------------------------------------- Wound Assessment Details Patient Name: Zoe Robinson Date of Service: 09/22/2017 10:15 AM Medical Record Number: [id number] Patient Account Number: Zoe Robinson Date of Birth/Sex: 17-Dec-1931 (85 y.o. Female) Treating RN: Curtis Sites Primary Care Jaylia Pettus: Gabriel Cirri Other Clinician: Referring Digna Countess: Gabriel Cirri Treating Arthur Speagle/Extender: Kathreen Cosier in Treatment: 2 Wound Status Wound Number: 1 Primary Venous Leg Ulcer Etiology: Wound Location: Left Lower Leg - Medial Wound Open Wounding Event: Trauma Status: Date Acquired: 05/02/2017 Comorbid Cataracts, Anemia, Arrhythmia, Hypertension, Weeks Of Treatment: 2 History: Rheumatoid Arthritis, Osteoarthritis Clustered Wound: No Photos Photo Uploaded By: Curtis Sites on 09/22/2017 11:35:01 Wound Measurements Length: (cm) 3.2 Width: (cm) 1.8 Depth: (cm) 0.4 Area: (cm) 4.524 Volume: (cm) 1.81 % Reduction in Area: 40.1% % Reduction in Volume: -139.4% Epithelialization: Small (1-33%) Tunneling: No Undermining: No Wound Description Full Thickness Without Exposed Support Classification: Structures Wound Margin: Flat and Intact Exudate Large Amount: Exudate Type: Sanguinous Exudate Color: red Foul Odor After Cleansing: No Slough/Fibrino Yes Wound Bed Granulation Amount: Medium (34-66%) Exposed Structure Granulation  Quality: Red, Hyper-granulation Fascia Exposed: No Necrotic Amount: Medium (34-66%) Fat Layer (Subcutaneous Tissue) Exposed: Yes Necrotic Quality: Adherent Slough Tendon Exposed: No Muscle Exposed: No Joint Exposed: No Bone Exposed: No Zoe Robinson, Zoe Robinson ([id number]) Periwound Skin Texture Texture Color No Abnormalities Noted: No No Abnormalities Noted: No Callus: No Atrophie Blanche: No Crepitus: No Cyanosis: No Excoriation: Yes Ecchymosis: No Induration: No Erythema: No Rash: No Hemosiderin Staining: No Scarring: No Mottled: No Pallor: No Moisture Rubor: No No Abnormalities Noted: No Dry / Scaly: No Temperature / Pain Maceration: No Temperature: No Abnormality Tenderness on Palpation: Yes Wound Preparation Ulcer Cleansing: Other: soap and water, Topical Anesthetic Applied: None Assessment Notes Patient has been experiencing excessive bleeding since last night. Treatment Notes Wound #1 (Left, Medial Lower Leg) 1. Cleansed with: Clean wound with Normal Saline 5. Secondary Dressing Applied Dry Gauze 7. Secured with Tape Notes Ramin Zoll used silver nitrate Electronic Signature(s) Signed: 09/22/2017 4:10:15 PM By: Curtis Sites Entered By: Curtis Sites on 09/22/2017 10:43:15 Zoe Robinson ([id number]) -------------------------------------------------------------------------------- Vitals Details Patient Name: Zoe Robinson Date of Service: 09/22/2017 10:15 AM Medical Record Number: [id number] Patient Account Number: Zoe Robinson Date of Birth/Sex: 12-12-1931 (85 y.o. Female) Treating RN: Curtis Sites Primary Care Kail Fraley: Gabriel Cirri Other Clinician: Referring Sherisa Gilvin: Gabriel Cirri Treating Shaletha Humble/Extender: Kathreen Cosier in Treatment: 2 Vital Signs Time Taken: 10:39 Temperature (F): 98.2 Height (in): 59 Pulse (bpm): 80 Weight (lbs): 117 Respiratory Rate (breaths/min): 16 Body Mass Index (BMI): 23.6 Blood Pressure (mmHg):  132/70 Reference Range: 80 - 120 mg / dl Electronic Signature(s) Signed: 09/22/2017 4:10:15 PM By: Curtis Sites Entered By: Curtis Sites on 09/22/2017 10:39:50

## 2017-09-28 ENCOUNTER — Encounter: Payer: Medicare Other | Attending: Internal Medicine | Admitting: Internal Medicine

## 2017-09-28 DIAGNOSIS — M069 Rheumatoid arthritis, unspecified: Secondary | ICD-10-CM | POA: Insufficient documentation

## 2017-09-28 DIAGNOSIS — L97822 Non-pressure chronic ulcer of other part of left lower leg with fat layer exposed: Secondary | ICD-10-CM | POA: Diagnosis present

## 2017-09-28 DIAGNOSIS — L97922 Non-pressure chronic ulcer of unspecified part of left lower leg with fat layer exposed: Secondary | ICD-10-CM | POA: Diagnosis not present

## 2017-09-28 DIAGNOSIS — E11622 Type 2 diabetes mellitus with other skin ulcer: Secondary | ICD-10-CM | POA: Diagnosis not present

## 2017-09-28 DIAGNOSIS — L03116 Cellulitis of left lower limb: Secondary | ICD-10-CM | POA: Insufficient documentation

## 2017-09-28 DIAGNOSIS — D649 Anemia, unspecified: Secondary | ICD-10-CM | POA: Insufficient documentation

## 2017-09-28 DIAGNOSIS — I499 Cardiac arrhythmia, unspecified: Secondary | ICD-10-CM | POA: Diagnosis not present

## 2017-09-28 DIAGNOSIS — I10 Essential (primary) hypertension: Secondary | ICD-10-CM | POA: Insufficient documentation

## 2017-09-28 DIAGNOSIS — Z87891 Personal history of nicotine dependence: Secondary | ICD-10-CM | POA: Insufficient documentation

## 2017-09-28 DIAGNOSIS — M810 Age-related osteoporosis without current pathological fracture: Secondary | ICD-10-CM | POA: Diagnosis not present

## 2017-09-28 DIAGNOSIS — Z7901 Long term (current) use of anticoagulants: Secondary | ICD-10-CM | POA: Insufficient documentation

## 2017-09-29 ENCOUNTER — Ambulatory Visit: Payer: Medicare Other | Admitting: Rheumatology

## 2017-09-29 NOTE — Progress Notes (Signed)
AVERLEE, SWARTZ ([id number]) Visit Report for 09/28/2017 HPI Details Patient Name: Zoe Robinson, Zoe Robinson Date of Service: 09/28/2017 1:45 PM Medical Record Number: [id number] Patient Account Number: [id number] Date of Birth/Sex: [birth year] (84 y.o. Female) Treating RN: Huel Coventry Primary Care Provider: Gabriel Cirri Other Clinician: Referring Provider: Gabriel Cirri Treating Provider/Extender: Altamese [place] in Treatment: 3 History of Present Illness HPI Description: 09/07/17; this is an 84 year old woman who arrives accompanied by her daughter. She has a history of severe rheumatoid arthritis followed by Dr. Corliss Skains in [place]. By review of care everywhere it appears that she had cellulitis of her left lower leg in September 2018. This may have been caused by a dog scratch. She was followed and her primary care office at [facility] affiliated group. She received antibiotics, Silvadene cream as well as Una boots. This was followed through late October and the wound was healed out on 10/31. The patient and her daughters state that the current wound started with a scissor injury to the medial left calf before the wound was actually healed out in other words this is a different wound area. The original wound was more medial on the left anterior tibial area. Since then they've been using Silvadene cream and apparently she changed doctors who recommended Polysporin and they've been referred here for the injury on her left medial calf. She does not have a history of rheumatoid-related skin issues/vasculitis The patient is a type II diabetic with most recent hemoglobin A1c of 6. She has advanced seropositive rheumatoid arthritis on prednisone 10 mg alternating with 5 and methotrexate. I see she is also on Serrato while I'm not really sure why. She has significant osteoporosis, history of hypercalcemia. ABI in this clinic is 1.08. She was a former smoker 09/14/17; patient's wound slightly smaller  today however still covered in a very thick adherent necrotic surface. We have been using Santyl under compression. We finally got home health to start yesterday "advanced" 09/21/17; not too much change in the area here. We have been using Santyl under compression and I'll continue this after debridement today. Consider Iodoflex if we don't get a better looking wound surface 09/22/17-she presents today as an urgent follow-up secondary to uncontrolled bleeding overnight. She has saturated through the 3-layer compression. Upon dressing removal there is significant clot formation, she continues to bleed from the most proximal aspect of the ulcer. 4 silver nitrate sticks were used to try to obtain hemostasis, along with 10 minutes of pressure dressing. The wound continues to bleed although not as briskly; this appears to be more of a venous bleed and arterial. She is hemodynamically stable upon presentation to the clinic SBP 130's, HR 80's with no complaints of dizziness, lightheadedness. We are unable to obtain any hemostatic agents from the emergency department. Since she has bled through the pressure dressing and continues to bleed despite all efforts available in the clinic she has been sent to the emergency department for evaluation and treatment 09/28/17; the patient has not had an easy time since the last time I saw her. She continues to have nonviable surface over the wound perhaps slightly better using Santyl. She has on Zarrella toe which no doubt contributes to the bleeding after debridement. I'm going to change her to Iodoflex today. She is having a lot of discomfort with this we gave her tramadol which is reasonable Electronic Signature(s) Signed: 09/28/2017 5:10:26 PM By: Baltazar Najjar MD Entered By: Baltazar Najjar on 09/28/2017 15:48:55 Zoe Robinson ([id number]) -------------------------------------------------------------------------------- Physical Exam Details Patient Name:  Zoe Robinson Date of Service: 09/28/2017 1:45 PM Medical Record Number: [id number] Patient Account Number: [id number] Date of Birth/Sex: May 30, 1932 (84 y.o. Female) Treating RN: Huel Coventry Primary Care Provider: Gabriel Cirri Other Clinician: Referring Provider: Gabriel Cirri Treating Provider/Extender: Maxwell Caul We[place] in Treatment: 3 Constitutional Sitting or standing Blood Pressure is within target range for patient.. Pulse regular and within target range for patient.Marland Kitchen Respirations regular, non-labored and within target range.. Temperature is normal and within the target range for the patient.Marland Kitchen appears in no distress. Respiratory Respiratory effort is easy and symmetric bilaterally. Rate is normal at rest and on room air.. Cardiovascular Pedal pulses palpable and strong bilaterally.Marland Kitchen edema is stable in the lower legs. Lymphatic none palpable in the popliteal area bilaterally. Integumentary (Hair, Skin) there is no systemic skin issues.Marland Kitchen Psychiatric No evidence of depression, anxiety, or agitation. Calm, cooperative, and communicative. Appropriate interactions and affect.. Notes when exam; left medial calf. Still covered in a nonviable surface although there may be some better looking surface superiorly. I been using Santyl for a prolonged period of time now without major improvement in the condition of the wound or the amount or quality of the surface material. I'm changing today to [facility]) Signed: 09/28/2017 5:10:26 PM By: Baltazar Najjar MD Entered By: Baltazar Najjar on 09/28/2017 15:50:55 Zoe Robinson ([id number]) -------------------------------------------------------------------------------- Physician Orders Details Patient Name: Zoe Robinson Date of Service: 09/28/2017 1:45 PM Medical Record Number: [id number] Patient Account Number: [id number] Date of Birth/Sex: [birth year] (84 y.o. Female) Treating RN: Huel Coventry Primary Care  Provider: Gabriel Cirri Other Clinician: Referring Provider: Gabriel Cirri Treating Provider/Extender: Altamese [place] in Treatment: 3 Verbal / Phone Orders: No Diagnosis Coding Wound Cleansing Wound #1 Left,Medial Lower Leg o Cleanse wound with mild soap and water Anesthetic (add to Medication List) Wound #1 Left,Medial Lower Leg o Topical Lidocaine 4% cream applied to wound bed prior to debridement (In Clinic Only). Skin Barriers/Peri-Wound Care Wound #1 Left,Medial Lower Leg o Barrier cream o Moisturizing lotion Primary Wound Dressing Wound #1 Left,Medial Lower Leg o Iodoflex Secondary Dressing Wound #1 Left,Medial Lower Leg o ABD pad Dressing Change Frequency Wound #1 Left,Medial Lower Leg o Change Dressing Monday, Wednesday, Friday Follow-up Appointments Wound #1 Left,Medial Lower Leg o Return Appointment in 1 week. Edema Control Wound #1 Left,Medial Lower Leg o 3 Layer Compression System - Left Lower Extremity o Elevate legs to the level of the heart and pump ankles as often as possible Home Health Wound #1 Left,Medial Lower Leg o Continue Home Health Visits o Home Health Nurse may visit PRN to address patientos wound care needs. o FACE TO FACE ENCOUNTER: MEDICARE and MEDICAID PATIENTS: I certify that this patient is under my care and that I had a face-to-face encounter that meets the physician face-to-face encounter requirements with this patient on this date. The encounter with the patient was in whole or in part for the following MEDICAL Zoe Robinson, Zoe Robinson ([id number]) CONDITION: (primary reason for Home Healthcare) MEDICAL NECESSITY: I certify, that based on my findings, NURSING services are a medically necessary home health service. HOME BOUND STATUS: I certify that my clinical findings support that this patient is homebound (i.e., Due to illness or injury, pt requires aid of supportive devices such as crutches, cane,  wheelchairs, walkers, the use of special transportation or the assistance of another person to leave their place of residence. There is a normal inability to leave the home and doing so requires considerable and taxing effort. Other absences are for medical  reasons / religious services and are infrequent or of short duration when for other reasons). o If current dressing causes regression in wound condition, may D/C ordered dressing product/s and apply Normal Saline Moist Dressing daily until next Wound Healing Center / Other MD appointment. Notify Wound Healing Center of regression in wound condition at [phone]. o Please direct any NON-WOUND related issues/requests for orders to patient's Primary Care Physician Patient Medications Allergies: No Known Allergies Notifications Medication Indication Start End lidocaine DOSE topical 4 % cream - cream topical Electronic Signature(s) Signed: 09/28/2017 4:24:03 PM By: Elliot Gurney, BSN, RN, CWS, Kim RN, BSN Signed: 09/28/2017 5:10:26 PM By: Baltazar Najjar MD Entered By: Elliot Gurney, BSN, RN, CWS, Kim on 09/28/2017 14:21:57 Zoe Robinson, Zoe Robinson ([id number]) -------------------------------------------------------------------------------- Prescription 09/28/2017 Patient Name: Zoe Robinson Provider: Maxwell Caul MD Date of Birth: [birth year] NPI#: [id number] Sex: F DEA#: [id number] Phone #: [phone] License #: [id number] Patient Address: [facility] [address] [facility] [place], [place] [address] [address], [address] [place], [place] [address] [phone] Allergies No Known Allergies Medication Medication: Route: Strength: Form: lidocaine topical 4% cream Class: TOPICAL LOCAL ANESTHETICS Dose: Frequency / Time: Indication: cream topical Number of Refills: Number of Units: 0 Generic Substitution: Start Date: End Date: Administered at Substitution Permitted  Facility: Yes Time Administered: Time Discontinued: Note to Pharmacy: Signature(s): Date(s): Electronic Signature(s) Signed: 09/28/2017 4:24:03 PM By: Elliot Gurney, BSN, RN, CWS, Kim RN, BSN Signed: 09/28/2017 5:10:26 PM By: Baltazar Najjar MD Entered By: Elliot Gurney, BSN, RN, CWS, Kim on 09/28/2017 14:21:58 Zoe Robinson, Zoe Robinson ([id number]) Zoe Robinson, Zoe Robinson ([id number]) --------------------------------------------------------------------------------  Problem List Details Patient Name: Zoe Robinson Date of Service: 09/28/2017 1:45 PM Medical Record Number: [id number] Patient Account Number: [id number] Date of Birth/Sex: [birth year] (85 y.o. 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 09/07/2017 Yes I87.312 Chronic venous hypertension (idiopathic) with ulcer of left lower 09/07/2017 Yes extremity M05.80 Other rheumatoid arthritis with rheumatoid factor of unspecified site 09/07/2017 Yes Inactive Problems Resolved Problems Electronic Signature(s) Signed: 09/28/2017 5:10:26 PM By: Baltazar Najjar MD Entered By: Baltazar Najjar on 09/28/2017 15:44:03 Zoe Robinson ([id number]) -------------------------------------------------------------------------------- Progress Note Details Patient Name: Zoe Robinson Date of Service: 09/28/2017 1:45 PM Medical Record Number: [id number] Patient Account Number: [id number] Date of Birth/Sex: 1932-05-05 (85 y.o. Female) Treating RN: Huel Coventry Primary Care Provider: Gabriel Cirri Other Clinician: Referring Provider: Gabriel Cirri Treating Provider/Extender: Maxwell Caul We[place] in Treatment: 3 Subjective History of Present Illness (HPI) 09/07/17; this is an  84 year old woman who arrives accompanied by her daughter. She has a history of severe rheumatoid arthritis followed by Dr. Corliss Skains in [place]. By review of care everywhere it appears that she had cellulitis of her left lower leg in September 2018. This may have been caused by a dog scratch. She was followed and her primary care office at [facility] affiliated group. She received antibiotics, Silvadene cream as well as Una boots. This was followed through late October and the wound was healed out on 10/31. The patient and her daughters state that the current wound started with a scissor injury to the medial left calf before the wound was actually healed out in other words this is a different wound area. The original wound was more medial on the left anterior tibial area. Since then they've been  using Silvadene cream and apparently she changed doctors who recommended Polysporin and they've been referred here for the injury on her left medial calf. She does not have a history of rheumatoid-related skin issues/vasculitis The patient is a type II diabetic with most recent hemoglobin A1c of 6. She has advanced seropositive rheumatoid arthritis on prednisone 10 mg alternating with 5 and methotrexate. I see she is also on Serrato while I'm not really sure why. She has significant osteoporosis, history of hypercalcemia. ABI in this clinic is 1.08. She was a former smoker 09/14/17; patient's wound slightly smaller today however still covered in a very thick adherent necrotic surface. We have been using Santyl under compression. We finally got home health to start yesterday "advanced" 09/21/17; not too much change in the area here. We have been using Santyl under compression and I'll continue this after debridement today. Consider Iodoflex if we don't get a better looking wound surface 09/22/17-she presents today as an urgent follow-up secondary to uncontrolled bleeding overnight. She has saturated through the  3-layer compression. Upon dressing removal there is significant clot formation, she continues to bleed from the most proximal aspect of the ulcer. 4 silver nitrate sticks were used to try to obtain hemostasis, along with 10 minutes of pressure dressing. The wound continues to bleed although not as briskly; this appears to be more of a venous bleed and arterial. She is hemodynamically stable upon presentation to the clinic SBP 130's, HR 80's with no complaints of dizziness, lightheadedness. We are unable to obtain any hemostatic agents from the emergency department. Since she has bled through the pressure dressing and continues to bleed despite all efforts available in the clinic she has been sent to the emergency department for evaluation and treatment 09/28/17; the patient has not had an easy time since the last time I saw her. She continues to have nonviable surface over the wound perhaps slightly better using Santyl. She has on Zarrella toe which no doubt contributes to the bleeding after debridement. I'm going to change her to Iodoflex today. She is having a lot of discomfort with this we gave her tramadol which is reasonable Objective Constitutional Sitting or standing Blood Pressure is within target range for patient.. Pulse regular and within target range for patient.Marland Kitchen Respirations regular, non-labored and within target range.. Temperature is normal and within the target range for the patient.Marland Kitchen appears in no distress. Zoe Robinson, Zoe Robinson ([id number]) Vitals Time Taken: 2:00 PM, Height: 59 in, Weight: 117 lbs, BMI: 23.6, Temperature: 98.0 F, Pulse: 80 bpm, Respiratory Rate: 16 breaths/min, Blood Pressure: 136/56 mmHg. Respiratory Respiratory effort is easy and symmetric bilaterally. Rate is normal at rest and on room air.. Cardiovascular Pedal pulses palpable and strong bilaterally.Marland Kitchen edema is stable in the lower legs. Lymphatic none palpable in the popliteal area  bilaterally. Psychiatric No evidence of depression, anxiety, or agitation. Calm, cooperative, and communicative. Appropriate interactions and affect.. General Notes: when exam; left medial calf. Still covered in a nonviable surface although there may be some better looking surface superiorly. I been using Santyl for a prolonged period of time now without major improvement in the condition of the wound or the amount or quality of the surface material. I'm changing today to Iodoflex Integumentary (Hair, Skin) there is no systemic skin issues.. Wound #1 status is Open. Original cause of wound was Trauma. The wound is located on the Left,Medial Lower Leg. The wound measures 3.2cm length x 1.9cm width x 0.4cm depth; 4.775cm^2 area and 1.91cm^3 volume. There is  Fat Layer (Subcutaneous Tissue) Exposed exposed. There is no tunneling or undermining noted. There is a medium amount of sanguinous drainage noted. The wound margin is flat and intact. There is no granulation within the wound bed. There is a large (67-100%) amount of necrotic tissue within the wound bed including Eschar and Adherent Slough. The periwound skin appearance exhibited: Excoriation. The periwound skin appearance did not exhibit: Callus, Crepitus, Induration, Rash, Scarring, Dry/Scaly, Maceration, Atrophie Blanche, Cyanosis, Ecchymosis, Hemosiderin Staining, Mottled, Pallor, Rubor, Erythema. Periwound temperature was noted as No Abnormality. The periwound has tenderness on palpation. Assessment Active Problems ICD-10 L97.223 - 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 muscle I87.312 - Chronic venous hypertension (idiopathic) with ulcer of left lower extremity M05.80 - Other rheumatoid arthritis with rheumatoid factor of unspecified site Procedures Wound #1 Pre-procedure diagnosis of Wound #1 is a Venous Leg Ulcer located on the Left,Medial Lower Leg . There was a Three Layer Compression Therapy Procedure with a  pre-treatment ABI of 1.1 by Huel Coventry, RN. Post procedure Diagnosis Wound #1: Same as Pre-Procedure Zoe Robinson, Zoe Robinson ([id number]) Plan Wound Cleansing: Wound #1 Left,Medial Lower Leg: Cleanse wound with mild soap and water Anesthetic (add to Medication List): Wound #1 Left,Medial Lower Leg: Topical Lidocaine 4% cream applied to wound bed prior to debridement (In Clinic Only). Skin Barriers/Peri-Wound Care: Wound #1 Left,Medial Lower Leg: Barrier cream Moisturizing lotion Primary Wound Dressing: Wound #1 Left,Medial Lower Leg: Iodoflex Secondary Dressing: Wound #1 Left,Medial Lower Leg: ABD pad Dressing Change Frequency: Wound #1 Left,Medial Lower Leg: Change Dressing Monday, Wednesday, Friday Follow-up Appointments: Wound #1 Left,Medial Lower Leg: Return Appointment in 1 week. Edema Control: Wound #1 Left,Medial Lower Leg: 3 Layer Compression System - Left Lower Extremity Elevate legs to the level of the heart and pump ankles as often as possible Home Health: Wound #1 Left,Medial Lower Leg: Continue Home Health Visits Home Health Nurse may visit PRN to address patient s wound care needs. FACE TO FACE ENCOUNTER: MEDICARE and MEDICAID PATIENTS: I certify that this patient is under my care and that I had a face-to-face encounter that meets the physician face-to-face encounter requirements with this patient on this date. The encounter with the patient was in whole or in part for the following MEDICAL CONDITION: (primary reason for Home Healthcare) MEDICAL NECESSITY: I certify, that based on my findings, NURSING services are a medically necessary home health service. HOME BOUND STATUS: I certify that my clinical findings support that this patient is homebound (i.e., Due to illness or injury, pt requires aid of supportive devices such as crutches, cane, wheelchairs, walkers, the use of special transportation or the assistance of another person to leave their place of residence.  There is a normal inability to leave the home and doing so requires considerable and taxing effort. Other absences are for medical reasons / religious services and are infrequent or of short duration when for other reasons). If current dressing causes regression in wound condition, may D/C ordered dressing product/s and apply Normal Saline Moist Dressing daily until next Wound Healing Center / Other MD appointment. Notify Wound Healing Center of regression in wound condition at [phone]. Please direct any NON-WOUND related issues/requests for orders to patient's Primary Care Physician The following medication(s) was prescribed: lidocaine topical 4 % cream cream topical was prescribed at facility [place], Daphene Jaeger ([id number]) #1 left medial lower leg. I change the primary dressing to Iodoflex we're not making enough progress with the sample. #2 noteworthy that even wiping the surface of this  wound causes bleeding. Have to keep this in mind if we attempt a mechanical debridement [very light] Electronic Signature(s) Signed: 09/28/2017 5:10:26 PM By: Baltazar Najjar MD Entered By: Baltazar Najjar on 09/28/2017 16:01:21 Zoe Robinson ([id number]) -------------------------------------------------------------------------------- SuperBill Details Patient Name: Zoe Robinson Date of Service: 09/28/2017 Medical Record Number: [id number] Patient Account Number: [id number] Date of Birth/Sex: 17-Apr-1932 (85 y.o. Female) Treating RN: Huel Coventry Primary Care Provider: Gabriel Cirri Other Clinician: Referring Provider: Gabriel Cirri Treating Provider/Extender: Maxwell Caul Weeks in Treatment: 3 Diagnosis Coding ICD-10 Codes Code Description [phone] 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 muscle I87.312 Chronic venous hypertension (idiopathic) with ulcer of left lower extremity M05.80 Other rheumatoid arthritis with rheumatoid factor of unspecified site Facility  Procedures CPT4 Code: [id number] Description: (Facility Use Only) [phone] - APPLY MULTLAY COMPRS LWR LT LEG Modifier: Quantity: 1 Physician Procedures CPT4 Code: 0051102 Description: 99213 - WC PHYS LEVEL 3 - EST PT ICD-10 Diagnosis Description L97.223 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 musc I87.312 Chronic venous hypertension (idiopathic) with ulcer of left l Modifier: le ower extremity Quantity: 1 Electronic Signature(s) Signed: 09/28/2017 5:10:26 PM By: Baltazar Najjar MD Entered By: Baltazar Najjar on 09/28/2017 16:02:11

## 2017-09-29 NOTE — Progress Notes (Signed)
Zoe Robinson ([id number]) Visit Report for 09/28/2017 Arrival Information Details Patient Name: Zoe Robinson, Zoe Robinson Date of Service: 09/28/2017 1:45 PM Medical Record Number: [id number] Patient Account Number: [id number] Date of Birth/Sex: 19-Apr-1932 (85 y.o. Female) Treating RN: Ashok Cordia, Debi Primary Care Chiyo Fay: Gabriel Cirri Other Clinician: Referring Taavi Hoose: Gabriel Cirri Treating Alieyah Spader/Extender: Altamese [place] in Treatment: 3 Visit Information History Since Last Visit All ordered tests and consults were completed: No Patient Arrived: Ambulatory Added or deleted any medications: No Arrival Time: 13:57 Any new allergies or adverse reactions: No Accompanied By: daughter Had a fall or experienced change in No Transfer Assistance: None activities of daily living that may affect Patient Identification Verified: Yes risk of falls: Secondary Verification Process Yes Signs or symptoms of abuse/neglect since last visito No Completed: Hospitalized since last visit: No Patient Has Alerts: Yes Has Dressing in Place as Prescribed: Yes Patient Alerts: Patient on Blood Pain Present Now: Yes Thinner xarelto Electronic Signature(s) Signed: 09/28/2017 4:15:20 PM By: Alejandro Mulling Entered By: Alejandro Mulling on 09/28/2017 13:59:48 Zoe Robinson ([id number]) -------------------------------------------------------------------------------- Compression Therapy Details Patient Name: Zoe Robinson Date of Service: 09/28/2017 1:45 PM Medical Record Number: [id number] Patient Account Number: [id number] Date of Birth/Sex: 02/27/1932 (85 y.o. Female) Treating RN: Huel Coventry Primary Care Jody Aguinaga: Gabriel Cirri Other Clinician: Referring Nusaiba Guallpa: Gabriel Cirri Treating Mckyla Deckman/Extender: Maxwell Caul Weeks in Treatment: 3 Compression Therapy Performed for Wound Assessment: Wound #1 Left,Medial Lower Leg Performed By: Clinician Huel Coventry, RN Compression  Type: Three Layer Pre Treatment ABI: 1.1 Post Procedure Diagnosis Same as Pre-procedure Electronic Signature(s) Signed: 09/28/2017 4:24:03 PM By: Elliot Gurney, BSN, RN, CWS, Kim RN, BSN Entered By: Elliot Gurney, BSN, RN, CWS, Kim on 09/28/2017 14:22:29 Zoe Robinson ([id number]) -------------------------------------------------------------------------------- Encounter Discharge Information Details Patient Name: Zoe Robinson Date of Service: 09/28/2017 1:45 PM Medical Record Number: [id number] Patient Account Number: [id number] Date of Birth/Sex: 1932/05/12 (85 y.o. Female) Treating RN: Huel Coventry Primary Care Trishelle Devora: Gabriel Cirri Other Clinician: Referring Jeris Roser: Gabriel Cirri Treating Fraidy Mccarrick/Extender: Altamese [place] in Treatment: 3 Encounter Discharge Information Items Discharge Pain Level: 0 Discharge Condition: Stable Ambulatory Status: Ambulatory Discharge Destination: Home Transportation: Private Auto Accompanied By: dtr Schedule Follow-up Appointment: Yes Medication Reconciliation completed and No provided to Patient/Care Shermaine Rivet: Provided on Clinical Summary of Care: 09/28/2017 Form Type Recipient Paper Patient JV Electronic Signature(s) Signed: 09/28/2017 4:38:42 PM By: Curtis Sites Entered By: Curtis Sites on 09/28/2017 14:59:49 Zoe Robinson ([id number]) -------------------------------------------------------------------------------- Lower Extremity Assessment Details Patient Name: Zoe Robinson Date of Service: 09/28/2017 1:45 PM Medical Record Number: [id number] Patient Account Number: [id number] Date of Birth/Sex: [birth year] (85 y.o. Female) Treating RN: Ashok Cordia, Debi Primary Care Kadee Philyaw: Gabriel Cirri Other Clinician: Referring Lavonne Kinderman: Gabriel Cirri Treating Breta Demedeiros/Extender: Maxwell Caul Weeks in Treatment: 3 Edema Assessment Assessed: [Left: No] [Right: No] [Left: Edema] [Right: :] Calf Left: Right: Point of  Measurement: 29 cm From Medial Instep 31 cm cm Ankle Left: Right: Point of Measurement: 10 cm From Medial Instep 17.7 cm cm Vascular Assessment Pulses: Dorsalis Pedis Palpable: [Left:Yes] Posterior Tibial Extremity colors, hair growth, and conditions: Extremity Color: [Left:Mottled] Temperature of Extremity: [Left:Warm] Capillary Refill: [Left:> 3 seconds] Toe Nail Assessment Left: Right: Thick: Yes Discolored: Yes Deformed: Yes Improper Length and Hygiene: Yes Electronic Signature(s) Signed: 09/28/2017 4:15:20 PM By: Alejandro Mulling Entered By: Alejandro Mulling on 09/28/2017 14:08:32 Zoe Robinson ([id number]) -------------------------------------------------------------------------------- Multi Wound Chart Details Patient Name: Zoe Robinson Date of Service: 09/28/2017 1:45 PM Medical Record Number: [id number] Patient Account Number: [id number] Date of Birth/Sex: January 15, 1932 (85 y.o. Female)  Treating RN: Huel Coventry Primary Care Joaovictor Krone: Gabriel Cirri Other Clinician: Referring Salbador Fiveash: Gabriel Cirri Treating Alexandr Yaworski/Extender: Altamese [place] in Treatment: 3 Vital Signs Height(in): 59 Pulse(bpm): 80 Weight(lbs): 117 Blood Pressure(mmHg): 136/56 Body Mass Index(BMI): 24 Temperature(F): 98.0 Respiratory Rate 16 (breaths/min): Photos: [1:No Photos] [N/A:N/A] Wound Location: [1:Left Lower Leg - Medial] [N/A:N/A] Wounding Event: [1:Trauma] [N/A:N/A] Primary Etiology: [1:Venous Leg Ulcer] [N/A:N/A] Comorbid History: [1:Cataracts, Anemia, Arrhythmia, Hypertension, Rheumatoid Arthritis, Osteoarthritis] [N/A:N/A] Date Acquired: [1:05/02/2017] [N/A:N/A] Weeks of Treatment: [1:3] [N/A:N/A] Wound Status: [1:Open] [N/A:N/A] Measurements L x W x D [1:3.2x1.9x0.4] [N/A:N/A] (cm) Area (cm) : [1:4.775] [N/A:N/A] Volume (cm) : [1:1.91] [N/A:N/A] % Reduction in Area: [1:36.80%] [N/A:N/A] % Reduction in Volume: [1:-152.60%] [N/A:N/A] Classification:  [1:Full Thickness Without Exposed Support Structures] [N/A:N/A] Exudate Amount: [1:Medium] [N/A:N/A] Exudate Type: [1:Sanguinous] [N/A:N/A] Exudate Color: [1:red] [N/A:N/A] Wound Margin: [1:Flat and Intact] [N/A:N/A] Granulation Amount: [1:None Present (0%)] [N/A:N/A] Necrotic Amount: [1:Large (67-100%)] [N/A:N/A] Necrotic Tissue: [1:Eschar, Adherent Slough] [N/A:N/A] Exposed Structures: [1:Fat Layer (Subcutaneous Tissue) Exposed: Yes Fascia: No Tendon: No Muscle: No Joint: No Bone: No] [N/A:N/A] Epithelialization: [1:Small (1-33%)] [N/A:N/A] Periwound Skin Texture: [1:Excoriation: Yes Induration: No Callus: No] [N/A:N/A] Crepitus: No Rash: No Scarring: No Periwound Skin Moisture: Maceration: No N/A N/A Dry/Scaly: No Periwound Skin Color: Atrophie Blanche: No N/A N/A Cyanosis: No Ecchymosis: No Erythema: No Hemosiderin Staining: No Mottled: No Pallor: No Rubor: No Temperature: No Abnormality N/A N/A Tenderness on Palpation: Yes N/A N/A Wound Preparation: Ulcer Cleansing: Other: soap N/A N/A and water Topical Anesthetic Applied: Other: lidocaine 4% Procedures Performed: Compression Therapy N/A N/A Treatment Notes Wound #1 (Left, Medial Lower Leg) 1. Cleansed with: Clean wound with Normal Saline Cleanse wound with antibacterial soap and water 2. Anesthetic Topical Lidocaine 4% cream to wound bed prior to debridement 3. Peri-wound Care: Moisturizing lotion 4. Dressing Applied: Iodoflex 5. Secondary Dressing Applied ABD Pad 7. Secured with 3 Layer Compression System - Left Lower Extremity Electronic Signature(s) Signed: 09/28/2017 5:10:26 PM By: Baltazar Najjar MD Entered By: Baltazar Najjar on 09/28/2017 15:44:13 Zoe Robinson ([id number]) -------------------------------------------------------------------------------- Multi-Disciplinary Care Plan Details Patient Name: Zoe Robinson Date of Service: 09/28/2017 1:45 PM Medical Record Number:  [id number] Patient Account Number: [id number] Date of Birth/Sex: 1932/02/19 (85 y.o. Female) Treating RN: Huel Coventry Primary Care Kajah Santizo: Gabriel Cirri Other Clinician: Referring Denina Rieger: Gabriel Cirri Treating Sandie Swayze/Extender: Altamese [place] in Treatment: 3 Active Inactive ` Abuse / Safety / Falls / Self Care Management Nursing Diagnoses: Potential for falls Goals: Patient will not experience any injury related to falls Date Initiated: 09/07/2017 Target Resolution Date: 12/03/2017 Goal Status: Active Interventions: Assess fall risk on admission and as needed Notes: ` Orientation to the Wound Care Program Nursing Diagnoses: Knowledge deficit related to the wound healing center program Goals: Patient/caregiver will verbalize understanding of the Wound Healing Center Program Date Initiated: 09/07/2017 Target Resolution Date: 12/03/2017 Goal Status: Active Interventions: Provide education on orientation to the wound center Notes: ` Wound/Skin Impairment Nursing Diagnoses: Impaired tissue integrity Goals: Ulcer/skin breakdown will heal within 14 weeks Date Initiated: 09/07/2017 Target Resolution Date: 12/03/2017 Goal Status: Active Interventions: MONSERRATE, BLASCHKE ([id number]) Assess patient/caregiver ability to obtain necessary supplies Assess patient/caregiver ability to perform ulcer/skin care regimen upon admission and as needed Assess ulceration(s) every visit Notes: Electronic Signature(s) Signed: 09/28/2017 4:24:03 PM By: Elliot Gurney, BSN, RN, CWS, Kim RN, BSN Entered By: Elliot Gurney, BSN, RN, CWS, Kim on 09/28/2017 14:18:21 Zoe Robinson ([id number]) -------------------------------------------------------------------------------- Pain Assessment Details Patient Name: Zoe Robinson Date of Service: 09/28/2017 1:45 PM Medical Record Number:  [id number] Patient Account Number: [id number] Date of Birth/Sex: [birth year] (84 y.o. Female) Treating RN: Ashok Cordia,  Debi Primary Care Ormond Lazo: Gabriel Cirri Other Clinician: Referring Natnael Biederman: Gabriel Cirri Treating Sadiyah Kangas/Extender: Maxwell Caul Weeks in Treatment: 3 Active Problems Location of Pain Severity and Description of Pain Patient Has Paino Yes Site Locations Pain Location: Pain in Ulcers Rate the pain. Current Pain Level: 2 Character of Pain Describe the Pain: Burning Pain Management and Medication Current Pain Management: Notes Topical or injectable lidocaine is offered to patient for acute pain when surgical debridement is performed. If needed, Patient is instructed to use over the counter pain medication for the following 24-48 hours after debridement. Wound care MDs do not prescribed pain medications. Patient has chronic pain or uncontrolled pain. Patient has been instructed to make an appointment with their Primary Care Physician for pain management. Electronic Signature(s) Signed: 09/28/2017 4:15:20 PM By: Alejandro Mulling Entered By: Alejandro Mulling on 09/28/2017 14:00:20 Zoe Robinson ([id number]) -------------------------------------------------------------------------------- Patient/Caregiver Education Details Patient Name: Zoe Robinson Date of Service: 09/28/2017 1:45 PM Medical Record Number: [id number] Patient Account Number: [id number] Date of Birth/Gender: [birth year] (85 y.o. Female) Treating RN: Curtis Sites Primary Care Physician: Gabriel Cirri Other Clinician: Referring Physician: Gabriel Cirri Treating Physician/Extender: Altamese [employer] in Treatment: 3 Education Assessment Education Provided To: Patient and Caregiver Education Topics Provided Safety: Handouts: Other: bleeding precautions Methods: Explain/Verbal Responses: State content correctly Electronic Signature(s) Signed: 09/28/2017 4:38:42 PM By: Curtis Sites Entered By: Curtis Sites on 09/28/2017 15:00:19 Zoe Robinson  ([id number]) -------------------------------------------------------------------------------- Wound Assessment Details Patient Name: Zoe Robinson Date of Service: 09/28/2017 1:45 PM Medical Record Number: [id number] Patient Account Number: [id number] Date of Birth/Sex: 03-17-1932 (85 y.o. Female) Treating RN: Ashok Cordia, Debi Primary Care Scarleth Brame: Gabriel Cirri Other Clinician: Referring Shaquoia Miers: Gabriel Cirri Treating Kanylah Muench/Extender: Maxwell Caul Weeks in Treatment: 3 Wound Status Wound Number: 1 Primary Venous Leg Ulcer Etiology: Wound Location: Left Lower Leg - Medial Wound Open Wounding Event: Trauma Status: Date Acquired: 05/02/2017 Comorbid Cataracts, Anemia, Arrhythmia, Hypertension, Weeks Of Treatment: 3 History: Rheumatoid Arthritis, Osteoarthritis Clustered Wound: No Photos Photo Uploaded By: Elliot Gurney, BSN, RN, CWS, Kim on 09/28/2017 16:45:00 Wound Measurements Length: (cm) 3.2 Width: (cm) 1.9 Depth: (cm) 0.4 Area: (cm) 4.775 Volume: (cm) 1.91 % Reduction in Area: 36.8% % Reduction in Volume: -152.6% Epithelialization: Small (1-33%) Tunneling: No Undermining: No Wound Description Full Thickness Without Exposed Support Foul Odo Classification: Structures Slough/F Wound Margin: Flat and Intact Exudate Medium Amount: Exudate Type: Sanguinous Exudate Color: red r After Cleansing: No ibrino Yes Wound Bed Granulation Amount: None Present (0%) Exposed Structure Necrotic Amount: Large (67-100%) Fascia Exposed: No Necrotic Quality: Eschar, Adherent Slough Fat Layer (Subcutaneous Tissue) Exposed: Yes Tendon Exposed: No Muscle Exposed: No Joint Exposed: No Bone Exposed: No Albaugh, Hendrix ([id number]) Periwound Skin Texture Texture Color No Abnormalities Noted: No No Abnormalities Noted: No Callus: No Atrophie Blanche: No Crepitus: No Cyanosis: No Excoriation: Yes Ecchymosis: No Induration: No Erythema: No Rash: No Hemosiderin  Staining: No Scarring: No Mottled: No Pallor: No Moisture Rubor: No No Abnormalities Noted: No Dry / Scaly: No Temperature / Pain Maceration: No Temperature: No Abnormality Tenderness on Palpation: Yes Wound Preparation Ulcer Cleansing: Other: soap and water, Topical Anesthetic Applied: Other: lidocaine 4%, Treatment Notes Wound #1 (Left, Medial Lower Leg) 1. Cleansed with: Clean wound with Normal Saline Cleanse wound with antibacterial soap and water 2. Anesthetic Topical Lidocaine 4% cream to wound bed prior to debridement 3. Peri-wound Care: Moisturizing lotion 4. Dressing Applied: Iodoflex 5. Secondary  Dressing Applied ABD Pad 7. Secured with 3 Layer Compression System - Left Lower Extremity Electronic Signature(s) Signed: 09/28/2017 4:15:20 PM By: Alejandro Mulling Entered By: Alejandro Mulling on 09/28/2017 14:06:45 Zoe Robinson ([id number]) -------------------------------------------------------------------------------- Vitals Details Patient Name: Zoe Robinson Date of Service: 09/28/2017 1:45 PM Medical Record Number: [id number] Patient Account Number: [id number] Date of Birth/Sex: 07-07-1932 (85 y.o. Female) Treating RN: Ashok Cordia, Debi Primary Care Catarina Huntley: Gabriel Cirri Other Clinician: Referring Tanner Yeley: Gabriel Cirri Treating Nilan Iddings/Extender: Maxwell Caul Weeks in Treatment: 3 Vital Signs Time Taken: 14:00 Temperature (F): 98.0 Height (in): 59 Pulse (bpm): 80 Weight (lbs): 117 Respiratory Rate (breaths/min): 16 Body Mass Index (BMI): 23.6 Blood Pressure (mmHg): 136/56 Reference Range: 80 - 120 mg / dl Electronic Signature(s) Signed: 09/28/2017 4:15:20 PM By: Alejandro Mulling Entered By: Alejandro Mulling on 09/28/2017 14:01:14

## 2017-09-30 NOTE — Progress Notes (Signed)
BINTOU, LAFATA ([id number]) Visit Report for 09/22/2017 Chief Complaint Document Details Patient Name: Zoe Robinson, Zoe Robinson Date of Service: 09/22/2017 10:15 AM Medical Record Number: [id number] Patient Account Number: [id number] Date of Birth/Sex: Sep 13, 1931 (85 y.o. Female) Treating RN: Phillis Haggis Primary Care Provider: Gabriel Cirri Other Clinician: Referring Provider: Gabriel Cirri Treating Provider/Extender: Kathreen Cosier in Treatment: 2 Information Obtained from: Patient Chief Complaint patient is here for review of a nonhealing wound on the left medial calf Electronic Signature(s) Signed: 09/22/2017 12:07:56 PM By: Bonnell Public Entered By: Bonnell Public on 09/22/2017 12:07:56 Zoe Robinson ([id number]) -------------------------------------------------------------------------------- HPI Details Patient Name: Zoe Robinson Date of Service: 09/22/2017 10:15 AM Medical Record Number: [id number] Patient Account Number: [id number] Date of Birth/Sex: February 14, 1932 (85 y.o. Female) Treating RN: Phillis Haggis Primary Care Provider: Gabriel Cirri Other Clinician: Referring Provider: Gabriel Cirri Treating Provider/Extender: Kathreen Cosier in Treatment: 2 History of Present Illness HPI Description: 09/07/17; this is an 84 year old woman who arrives accompanied by her daughter. She has a history of severe rheumatoid arthritis followed by Dr. Corliss Skains in [place]. By review of care everywhere it appears that she had cellulitis of her left lower leg in September 2018. This may have been caused by a dog scratch. She was followed and her primary care office at [facility] affiliated group. She received antibiotics, Silvadene cream as well as Una boots. This was followed through late October and the wound was healed out on 10/31. The patient and her daughters state that the current wound started with a scissor injury to the medial left calf before the wound was actually  healed out in other words this is a different wound area. The original wound was more medial on the left anterior tibial area. Since then they've been using Silvadene cream and apparently she changed doctors who recommended Polysporin and they've been referred here for the injury on her left medial calf. She does not have a history of rheumatoid-related skin issues/vasculitis The patient is a type II diabetic with most recent hemoglobin A1c of 6. She has advanced seropositive rheumatoid arthritis on prednisone 10 mg alternating with 5 and methotrexate. I see she is also on Serrato while I'm not really sure why. She has significant osteoporosis, history of hypercalcemia. ABI in this clinic is 1.08. She was a former smoker 09/14/17; patient's wound slightly smaller today however still covered in a very thick adherent necrotic surface. We have been using Santyl under compression. We finally got home health to start yesterday "advanced" 09/21/17; not too much change in the area here. We have been using Santyl under compression and I'll continue this after debridement today. Consider Iodoflex if we don't get a better looking wound surface 09/22/17-she presents today as an urgent follow-up secondary to uncontrolled bleeding overnight. She has saturated through the 3-layer compression. Upon dressing removal there is significant clot formation, she continues to bleed from the most proximal aspect of the ulcer. 4 silver nitrate sticks were used to try to obtain hemostasis, along with 10 minutes of pressure dressing. The wound continues to bleed although not as briskly; this appears to be more of a venous bleed and arterial. She is hemodynamically stable upon presentation to the clinic SBP 130's, HR 80's with no complaints of dizziness, lightheadedness. We are unable to obtain any hemostatic agents from the emergency department. Since she has bled through the pressure dressing and continues to bleed despite all  efforts available in the clinic she has been sent to the emergency department for evaluation and treatment Electronic Signature(s)  Signed: 09/22/2017 12:11:00 PM By: Bonnell Public Previous Signature: 09/22/2017 12:08:30 PM Version By: Bonnell Public Entered By: Bonnell Public on 09/22/2017 12:11:00 Zoe Robinson ([id number]) -------------------------------------------------------------------------------- Physician Orders Details Patient Name: Zoe Robinson Date of Service: 09/22/2017 10:15 AM Medical Record Number: [id number] Patient Account Number: [id number] Date of Birth/Sex: 02-Mar-1932 (85 y.o. Female) Treating RN: Ashok Cordia, Debi Primary Care Provider: Gabriel Cirri Other Clinician: Referring Provider: Gabriel Cirri Treating Provider/Extender: Kathreen Cosier in Treatment: 2 Verbal / Phone Orders: Yes Clinician: Pinkerton, Debi Read Back and Verified: Yes Diagnosis Coding Wound Cleansing Wound #1 Left,Medial Lower Leg o Clean wound with Normal Saline. Secondary Dressing Wound #1 Left,Medial Lower Leg o Dry Gauze - pressure dressing o Other - mepitel tape Follow-up Appointments Wound #1 Left,Medial Lower Leg o Other: - provider used silver nitrate to slow or stop bleeding go to er for the bleeding these orders are only for today [facility] to return tomorrow and rewrap pt and orders are to resume as to what they were before Electronic Signature(s) Signed: 09/29/2017 1:53:22 PM By: Bonnell Public Entered By: Bonnell Public on 09/22/2017 12:11:46 Zoe Robinson ([id number]) -------------------------------------------------------------------------------- Problem List Details Patient Name: Zoe Robinson Date of Service: 09/22/2017 10:15 AM Medical Record Number: [id number] Patient Account Number: [id number] Date of Birth/Sex: Jan 23, 1932 (85 y.o. 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 09/07/2017 Yes I87.312 Chronic venous hypertension (idiopathic) with ulcer of left lower 09/07/2017 Yes extremity M05.80 Other rheumatoid arthritis with rheumatoid factor of unspecified site 09/07/2017 Yes Inactive Problems Resolved Problems Electronic Signature(s) Signed: 09/22/2017 12:07:20 PM By: Bonnell Public Entered By: Bonnell Public on 09/22/2017 12:07:20 Zoe Robinson ([id number]) -------------------------------------------------------------------------------- Progress Note Details Patient Name: Zoe Robinson Date of Service: 09/22/2017 10:15 AM Medical Record Number: [id number] Patient Account Number: [id number] Date of Birth/Sex: 1931/12/29 (85 y.o. Female) Treating RN: Phillis Haggis Primary Care Provider: Gabriel Cirri Other Clinician: Referring Provider: Gabriel Cirri Treating Provider/Extender: Kathreen Cosier in Treatment: 2 Subjective Chief Complaint Information obtained from Patient patient is here for review of a nonhealing wound on the left medial calf History of Present Illness (HPI) 09/07/17; this is an 85 year old woman who arrives accompanied by her daughter. She has a history of severe rheumatoid arthritis followed by Dr. Corliss Skains in [place]. By review of care everywhere it appears that she had cellulitis of her left lower leg in September 2018. This may have been caused by a dog scratch. She was followed and her primary care office at [facility] affiliated group. She received antibiotics, Silvadene cream as well as Una boots. This was followed through late October and the wound was healed out on 10/31. The patient and her daughters state that the current wound started with a scissor injury to the medial left calf before the  wound was actually healed out in other words this is a different wound area. The original wound was more medial on the left anterior tibial area. Since then they've been using Silvadene cream and apparently she changed doctors who recommended Polysporin and they've been referred here for the injury on her left medial calf. She does not have a history of rheumatoid-related skin issues/vasculitis The patient is a type II diabetic with most recent hemoglobin A1c of 6. She has advanced seropositive rheumatoid arthritis on prednisone 10 mg alternating with 5 and methotrexate. I  see she is also on Serrato while I'm not really sure why. She has significant osteoporosis, history of hypercalcemia. ABI in this clinic is 1.08. She was a former smoker 09/14/17; patient's wound slightly smaller today however still covered in a very thick adherent necrotic surface. We have been using Santyl under compression. We finally got home health to start yesterday "advanced" 09/21/17; not too much change in the area here. We have been using Santyl under compression and I'll continue this after debridement today. Consider Iodoflex if we don't get a better looking wound surface 09/22/17-she presents today as an urgent follow-up secondary to uncontrolled bleeding overnight. She has saturated through the 3-layer compression. Upon dressing removal there is significant clot formation, she continues to bleed from the most proximal aspect of the ulcer. 4 silver nitrate sticks were used to try to obtain hemostasis, along with 10 minutes of pressure dressing. The wound continues to bleed although not as briskly; this appears to be more of a venous bleed and arterial. She is hemodynamically stable upon presentation to the clinic SBP 130's, HR 80's with no complaints of dizziness, lightheadedness. We are unable to obtain any hemostatic agents from the emergency department. Since she has bled through the pressure dressing and continues  to bleed despite all efforts available in the clinic she has been sent to the emergency department for evaluation and treatment Patient History Information obtained from Patient. Family History Cancer - Siblings, Heart Disease - Father,Mother,Siblings, Hypertension - Mother,Father,Siblings, No family history of Diabetes, Hereditary Spherocytosis, Kidney Disease, Lung Disease, Seizures, Stroke, Thyroid Problems, Tuberculosis. Social History Former smoker, Marital Status - Married, Alcohol Use - Never, Drug Use - No History, Caffeine Use - Daily. Medical And Surgical History Notes Zoe Robinson, Zoe Robinson ([id number]) Cardiovascular heart murmur Objective Constitutional Vitals Time Taken: 10:39 AM, Height: 59 in, Weight: 117 lbs, BMI: 23.6, Temperature: 98.2 F, Pulse: 80 bpm, Respiratory Rate: 16 breaths/min, Blood Pressure: 132/70 mmHg. Integumentary (Hair, Skin) Wound #1 status is Open. Original cause of wound was Trauma. The wound is located on the Left,Medial Lower Leg. The wound measures 3.2cm length x 1.8cm width x 0.4cm depth; 4.524cm^2 area and 1.81cm^3 volume. There is Fat Layer (Subcutaneous Tissue) Exposed exposed. There is no tunneling or undermining noted. There is a large amount of sanguinous drainage noted. The wound margin is flat and intact. There is medium (34-66%) red, hyper - granulation within the wound bed. There is a medium (34-66%) amount of necrotic tissue within the wound bed including Adherent Slough. The periwound skin appearance exhibited: Excoriation. The periwound skin appearance did not exhibit: Callus, Crepitus, Induration, Rash, Scarring, Dry/Scaly, Maceration, Atrophie Blanche, Cyanosis, Ecchymosis, Hemosiderin Staining, Mottled, Pallor, Rubor, Erythema. Periwound temperature was noted as No Abnormality. The periwound has tenderness on palpation. General Notes: Patient has been experiencing excessive bleeding since last night. Assessment Active  Problems ICD-10 L97.223 - 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 muscle I87.312 - Chronic venous hypertension (idiopathic) with ulcer of left lower extremity M05.80 - Other rheumatoid arthritis with rheumatoid factor of unspecified site I spoke with [facility], ER charge, regarding interventions taken in the clinic for hemostasis without success, along with the amount of blood loss overnight and need for evaluation Plan Wound Cleansing: Wound #1 Left,Medial Lower Leg: Clean wound with Normal Saline. Secondary Dressing: Zoe Robinson, Zoe Robinson ([id number]) Wound #1 Left,Medial Lower Leg: Dry Gauze - pressure dressing Other - mepitel tape Follow-up Appointments: Wound #1 Left,Medial Lower Leg: Other: - provider used silver nitrate to slow or stop  bleeding go to er for the bleeding these orders are only for today [facility] to return tomorrow and rewrap pt and orders are to resume as to what they were before 1. Pressure dressing and 2. ER for evaluation 3. Contact home health for dressing tomorrow Electronic Signature(s) Signed: 09/22/2017 12:13:32 PM By: Bonnell Public Entered By: Bonnell Public on 09/22/2017 12:13:32 Zoe Robinson ([id number]) -------------------------------------------------------------------------------- ROS/PFSH Details Patient Name: Zoe Robinson Date of Service: 09/22/2017 10:15 AM Medical Record Number: [id number] Patient Account Number: [id number] Date of Birth/Sex: Mar 17, 1932 (85 y.o. Female) Treating RN: Ashok Cordia, Debi Primary Care Provider: Gabriel Cirri Other Clinician: Referring Provider: Gabriel Cirri Treating Provider/Extender: Kathreen Cosier in Treatment: 2 Information Obtained From Patient Wound History Do you currently have one or more open woundso Yes How many open wounds do you currently haveo 1 Approximately how long have you had your woundso 5 months How have you been treating your wound(s) until nowo silvadene Has your  wound(s) ever healed and then re-openedo No Have you had any lab work done in the past montho No Have you tested positive for an antibiotic resistant organism (MRSA, VRE)o No Have you tested positive for osteomyelitis (bone infection)o No Have you had any tests for circulation on your legso No Have you had other problems associated with your woundso Swelling Eyes Medical History: Positive for: Cataracts - removed Negative for: Glaucoma; Optic Neuritis Hematologic/Lymphatic Medical History: Positive for: Anemia Negative for: Hemophilia; Human Immunodeficiency Virus; Lymphedema; Sickle Cell Disease Respiratory Medical History: Negative for: Aspiration; Asthma; Chronic Obstructive Pulmonary Disease (COPD); Pneumothorax; Sleep Apnea; Tuberculosis Cardiovascular Medical History: Positive for: Arrhythmia - a fib; Hypertension Negative for: Angina; Congestive Heart Failure; Coronary Artery Disease; Deep Vein Thrombosis; Hypotension; Myocardial Infarction; Peripheral Arterial Disease; Peripheral Venous Disease; Phlebitis; Vasculitis Past Medical History Notes: heart murmur Gastrointestinal Medical History: Negative for: Cirrhosis ; Colitis; Crohnos; Hepatitis A; Hepatitis B; Hepatitis C Endocrine Zoe Robinson, Zoe Robinson ([id number]) Medical History: Negative for: Type I Diabetes; Type II Diabetes Immunological Medical History: Negative for: Lupus Erythematosus; Raynaudos; Scleroderma Musculoskeletal Medical History: Positive for: Rheumatoid Arthritis; Osteoarthritis Negative for: Gout; Osteomyelitis Neurologic Medical History: Negative for: Dementia; Neuropathy; Seizure Disorder Oncologic Medical History: Negative for: Received Chemotherapy; Received Radiation HBO Extended History Items Eyes: Cataracts Immunizations Pneumococcal Vaccine: Received Pneumococcal Vaccination: Yes Immunization Notes: up to date Implantable Devices Family and Social History Cancer: Yes - Siblings;  Diabetes: No; Heart Disease: Yes - Father,Mother,Siblings; Hereditary Spherocytosis: No; Hypertension: Yes - Mother,Father,Siblings; Kidney Disease: No; Lung Disease: No; Seizures: No; Stroke: No; Thyroid Problems: No; Tuberculosis: No; Former smoker; Marital Status - Married; Alcohol Use: Never; Drug Use: No History; Caffeine Use: Daily; Financial Concerns: No; Food, Clothing or Shelter Needs: No; Support System Lacking: No; Transportation Concerns: No; Advanced Directives: No; Patient does not want information on Advanced Directives Physician Affirmation I have reviewed and agree with the above information. Electronic Signature(s) Signed: 09/22/2017 4:59:29 PM By: Alejandro Mulling Signed: 09/29/2017 1:53:22 PM By: Bonnell Public Entered By: Bonnell Public on 09/22/2017 12:11:20 Zoe Robinson ([id number]) -------------------------------------------------------------------------------- SuperBill Details Patient Name: Zoe Robinson Date of Service: 09/22/2017 Medical Record Number: [id number] Patient Account Number: [id number] Date of Birth/Sex: 1932/07/05 (85 y.o. Female) Treating RN: Phillis Haggis Primary Care Provider: Gabriel Cirri Other Clinician: Referring Provider: Gabriel Cirri Treating Provider/Extender: Kathreen Cosier in Treatment: 2 Diagnosis Coding ICD-10 Codes Code Description [phone] 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 muscle I87.312 Chronic venous hypertension (idiopathic) with ulcer of left lower extremity M05.80 Other rheumatoid arthritis with rheumatoid factor of  unspecified site Facility Procedures CPT4 Code: [id number] Description: 99213 - WOUND CARE VISIT-LEV 3 EST PT Modifier: Quantity: 1 Physician Procedures CPT4 Code: [id number] Description: 99213 - WC PHYS LEVEL 3 - EST PT ICD-10 Diagnosis Description L97.223 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 musc I87.312 Chronic venous hypertension (idiopathic) with ulcer  of left l M05.80 Other rheumatoid arthritis with  rheumatoid factor of unspecif Modifier: le ower extremity ied site Quantity: 1 Electronic Signature(s) Signed: 09/29/2017 1:53:22 PM By: Bonnell Public Previous Signature: 09/22/2017 12:13:49 PM Version By: Bonnell Public Entered By: Francie Massing on 09/23/2017 13:32:45

## 2017-10-03 NOTE — Progress Notes (Signed)
Office Visit Note  Patient: Zoe Robinson             Date of Birth: 1931-12-13           MRN: [id number]             PCP: Darlis Loan, NP Referring: Gennette Pac, NP Visit Date: 10/04/2017 Occupation: @GUAROCC @    Subjective:  Medication management.   History of Present Illness: Zoe Robinson is a 84 y.o. female with history of severe rheumatoid arthritis.  She states she has been on methotrexate 5 tablets p.o. weekly since the last visit.  She has noticed improvement in her arthritis.  She has stopped taking prednisone 10 mg alternating with 5 mg p.o. every other day which was prescribed by Dr. Charlestine Night several years ago.  She denies any joint swelling.  She continues to have some stiffness in her right hand.  She is on Prolia for osteoporosis. Patient's daughter states that she has been fighting ulcer on her left lower extremity for which she has been going to wound care center.  She was treated with antibiotics in the past but not recently.  She states is not been healing .  Activities of Daily Living:  Patient reports morning stiffness for 30 minutes.   Patient Denies nocturnal pain.  Difficulty dressing/grooming: Denies Difficulty climbing stairs: Denies Difficulty getting out of chair: Reports Difficulty using hands for taps, buttons, cutlery, and/or writing: Reports   Review of Systems  Constitutional: Negative for fatigue, night sweats, weight gain, weight loss and weakness.  HENT: Negative for mouth sores, trouble swallowing, trouble swallowing, mouth dryness and nose dryness.   Eyes: Negative for pain, redness, visual disturbance and dryness.  Respiratory: Negative for cough, shortness of breath and difficulty breathing.   Cardiovascular: Negative for chest pain, palpitations, hypertension, irregular heartbeat and swelling in legs/feet.  Gastrointestinal: Negative for blood in stool, constipation, diarrhea and nausea.  Endocrine: Negative for increased  urination.  Genitourinary: Negative for pelvic pain and vaginal dryness.  Musculoskeletal: Positive for arthralgias, joint pain and morning stiffness. Negative for joint swelling, myalgias, muscle weakness, muscle tenderness and myalgias.  Skin: Negative for color change, rash, hair loss, redness, skin tightness, ulcers and sensitivity to sunlight.  Allergic/Immunologic: Negative for susceptible to infections.  Neurological: Negative for dizziness, headaches, memory loss and night sweats.  Hematological: Negative for bruising/bleeding tendency and swollen glands.  Psychiatric/Behavioral: Negative for depressed mood, confusion and sleep disturbance. The patient is not nervous/anxious.     PMFS History:  Patient Active Problem List   Diagnosis Date Noted  . High risk medication use 03/25/2017  . Rheumatoid arthritis involving multiple sites with positive rheumatoid factor ([place]) 03/15/2017  . Rheumatoid arthritis involving both knees with positive rheumatoid factor ([place]) 03/15/2017  . Rheumatoid arthritis involving both hands with positive rheumatoid factor ([place]) 03/15/2017  . Rheumatoid arthritis involving both feet with positive rheumatoid factor ([place]) 03/15/2017  . History of vertebral fracture 03/15/2017  . On prednisone therapy 02/10/2017  . Age-related osteoporosis without current pathological fracture 02/10/2017  . History of pulmonary hypertension 02/10/2017  . History of atrial fibrillation 02/10/2017  . History of anemia 02/10/2017  . History of recurrent UTIs 02/10/2017  . History of hypercalcemia 02/10/2017  . On anticoagulant therapy 02/10/2017  . Acute encephalopathy 08/19/2014  . Neutropenia ([place]) 04/20/2014  . Fever, unspecified 04/20/2014  . Unspecified constipation 04/20/2014  . Hypercalcemia 04/15/2014  . Acute renal failure ([place]) 04/15/2014  . Generalized weakness 04/15/2014  . Dehydration  04/15/2014  . UTI (urinary tract infection) 04/15/2014  . Iron deficiency  anemia 04/15/2014  . CKD (chronic kidney disease)   . Hypertension   . Atrial fibrillation [facility])     Past Medical History:  Diagnosis Date  . Arthritis   . Atrial fibrillation ([place])   . CKD (chronic kidney disease)   . Hypertension   . RA (rheumatoid arthritis) (HCC)     Family History  Problem Relation Age of Onset  . Heart attack Mother   . Heart attack Father   . Heart attack Brother   . Heart attack Brother   . Stomach cancer Brother   . Cancer Sister   . Dementia Sister   . Osteoarthritis Daughter   . Depression Daughter   . Hypothyroidism Daughter   . Hypercholesterolemia Daughter   . Heart disease Son   . Hypertension Son   . Gout Son    Past Surgical History:  Procedure Laterality Date  . ABDOMINAL HYSTERECTOMY     Social History   Social History Narrative  . Not on file     Objective: Vital Signs: BP (!) 151/70 (BP Location: Left Arm, Patient Position: Sitting, Cuff Size: Normal)   Pulse 85   Resp 15   Ht 4' 11"  (1.499 m)   Wt 120 lb (54.4 kg)   BMI 24.24 kg/m    Physical Exam  Constitutional: She is oriented to person, place, and time. She appears well-developed and well-nourished.  HENT:  Head: Normocephalic and atraumatic.  Eyes: Conjunctivae and EOM are normal.  Neck: Normal range of motion.  Cardiovascular: Normal rate, regular rhythm, normal heart sounds and intact distal pulses.  Pulmonary/Chest: Effort normal and breath sounds normal.  Abdominal: Soft. Bowel sounds are normal.  Lymphadenopathy:    She has no cervical adenopathy.  Neurological: She is alert and oriented to person, place, and time.  Skin: Skin is warm and dry. Capillary refill takes less than 2 seconds.  Psychiatric: She has a normal mood and affect. Her behavior is normal.  Nursing note and vitals reviewed.    Musculoskeletal Exam: C-spine good range of motion.  She has thoracic kyphosis.  Shoulder joints and elbow joints are good range of motion.  She has synovial  thickening in the ulnar deviation bilaterally.  She has good range of motion of her hip joints knee joints.  Her left lower extremity was wrapped.  CDAI Exam: CDAI Homunculus Exam:   Joint Counts:  CDAI Tender Joint count: 0 CDAI Swollen Joint count: 0  Global Assessments:  Patient Global Assessment: 5 Provider Global Assessment: 3  CDAI Calculated Score: 8    Investigation: No additional findings.PPD: 03/23/2017 Negative  CBC Latest Ref Rng & Units 08/01/2017 07/11/2017 06/23/2017  WBC 3.8 - 10.8 Thousand/uL 8.2 8.9 9.9  Hemoglobin 11.7 - 15.5 g/dL 10.9(L) 10.0(L) 10.1(L)  Hematocrit 35.0 - 45.0 % 31.9(L) 29.3(L) 29.9(L)  Platelets 140 - 400 Thousand/uL 370 363 421(H)   CMP Latest Ref Rng & Units 08/01/2017 07/11/2017 06/23/2017  Glucose 65 - 99 mg/dL 81 132(H) 103(H)  BUN 7 - 25 mg/dL 19 26(H) 24  Creatinine 0.60 - 0.88 mg/dL 0.87 0.85 0.92(H)  Sodium 135 - 146 mmol/L 132(L) 133(L) 134(L)  Potassium 3.5 - 5.3 mmol/L 5.0 4.7 5.1  Chloride 98 - 110 mmol/L 96(L) 95(L) 97(L)  CO2 20 - 32 mmol/L 29 29 30   Calcium 8.6 - 10.4 mg/dL 9.7 9.3 9.7  Total Protein 6.1 - 8.1 g/dL 7.3 6.8 7.1  Total  Bilirubin 0.2 - 1.2 mg/dL 0.4 0.3 0.3  Alkaline Phos 39 - 117 U/L - - -  AST 10 - 35 U/L 18 18 18   ALT 6 - 29 U/L 11 11 12     Imaging: No results found.  Speciality Comments: No specialty comments available.    Procedures:  No procedures performed Allergies: Patient has no known allergies.   Assessment / Plan:     Visit Diagnoses: Rheumatoid arthritis involving multiple sites with positive rheumatoid factor (HCC) - Positive RF, positive anti-CCP, positive ANA, high ESR.  Patient has severe rheumatoid arthritis with multiple contractures.  She states her rheumatoid arthritis is better since she has been taking methotrexate.  She is still on prednisone which was a started by Dr. Charlestine Night several years ago.  She has a nonhealing left lower extremity ulcer.  She has been going to wound care  center for that.  I am concerned that immunosuppression may be interfering with the healing of the ulcer.  I will discontinue methotrexate.  I also advised him to lower prednisone to his 5 mg alternating with 7.5 mg for a month.  If she tolerates that then she can decrease the dose to 5 mg every day.  Once the ulcer is healed we can resume methotrexate.  High risk medication use - MTX 5 tabs po q wk, folic acid 16m po qd.  Her labs have been stable.  On prednisone therapy - She takes 5 mg /10 mg of prednisone every other day. Which was prescribed by Dr. TCharlestine Night Ulcer of left lower extremity, unspecified ulcer stage ([facility] Followed followed up by wound clinic.  Rheumatoid arthritis involving both hands with positive rheumatoid factor (HCC)  Rheumatoid arthritis involving both knees with positive rheumatoid factor (HCC)  Rheumatoid arthritis involving both feet with positive rheumatoid factor (HCC)  Age-related osteoporosis without current pathological fracture - Patient has been started on Prolia by her PCP  History of vertebral fracture  On anticoagulant therapy  Acute cystitis without hematuria - Recurrent. Patient was referred to urology. She states she has been treated for UTI by her PCP and she prefers not to see urologist at this time.  History of atrial fibrillation  History of pulmonary hypertension  Hypercalcemia    Orders: No orders of the defined types were placed in this encounter.  No orders of the defined types were placed in this encounter.   Face-to-face time spent with patient was 30 minutes.  Greater than 50% of time was spent in counseling and coordination of care.  Follow-Up Instructions: Return in about 3 months (around 01/04/2018) for Rheumatoid arthritis, Osteoarthritis, Osteoporosis.   SBo Merino MD  Note - This record has been created using DEditor, commissioning  Chart creation errors have been sought, but may not always  have been located. Such  creation errors do not reflect on  the standard of medical care.

## 2017-10-04 ENCOUNTER — Ambulatory Visit (INDEPENDENT_AMBULATORY_CARE_PROVIDER_SITE_OTHER): Payer: Medicare Other | Admitting: Rheumatology

## 2017-10-04 ENCOUNTER — Encounter: Payer: Self-pay | Admitting: Rheumatology

## 2017-10-04 VITALS — BP 151/70 | HR 85 | Resp 15 | Ht 59.0 in | Wt 120.0 lb

## 2017-10-04 DIAGNOSIS — Z8679 Personal history of other diseases of the circulatory system: Secondary | ICD-10-CM | POA: Diagnosis not present

## 2017-10-04 DIAGNOSIS — Z8781 Personal history of (healed) traumatic fracture: Secondary | ICD-10-CM

## 2017-10-04 DIAGNOSIS — M0579 Rheumatoid arthritis with rheumatoid factor of multiple sites without organ or systems involvement: Secondary | ICD-10-CM

## 2017-10-04 DIAGNOSIS — L97929 Non-pressure chronic ulcer of unspecified part of left lower leg with unspecified severity: Secondary | ICD-10-CM

## 2017-10-04 DIAGNOSIS — M05741 Rheumatoid arthritis with rheumatoid factor of right hand without organ or systems involvement: Secondary | ICD-10-CM | POA: Diagnosis not present

## 2017-10-04 DIAGNOSIS — Z7952 Long term (current) use of systemic steroids: Secondary | ICD-10-CM

## 2017-10-04 DIAGNOSIS — M05761 Rheumatoid arthritis with rheumatoid factor of right knee without organ or systems involvement: Secondary | ICD-10-CM

## 2017-10-04 DIAGNOSIS — N3000 Acute cystitis without hematuria: Secondary | ICD-10-CM

## 2017-10-04 DIAGNOSIS — M05771 Rheumatoid arthritis with rheumatoid factor of right ankle and foot without organ or systems involvement: Secondary | ICD-10-CM | POA: Diagnosis not present

## 2017-10-04 DIAGNOSIS — Z79899 Other long term (current) drug therapy: Secondary | ICD-10-CM | POA: Diagnosis not present

## 2017-10-04 DIAGNOSIS — M810 Age-related osteoporosis without current pathological fracture: Secondary | ICD-10-CM

## 2017-10-04 DIAGNOSIS — M05742 Rheumatoid arthritis with rheumatoid factor of left hand without organ or systems involvement: Secondary | ICD-10-CM

## 2017-10-04 DIAGNOSIS — M05772 Rheumatoid arthritis with rheumatoid factor of left ankle and foot without organ or systems involvement: Secondary | ICD-10-CM

## 2017-10-04 DIAGNOSIS — M05762 Rheumatoid arthritis with rheumatoid factor of left knee without organ or systems involvement: Secondary | ICD-10-CM

## 2017-10-04 DIAGNOSIS — Z7901 Long term (current) use of anticoagulants: Secondary | ICD-10-CM

## 2017-10-04 NOTE — Patient Instructions (Signed)
Discontinue methotrexate.  Decrease prednisone to 5 mg alternating with 7.5 mg every other day for 1 month.  If tolerated decrease prednisone to 5 mg every day.

## 2017-10-05 ENCOUNTER — Encounter: Payer: Medicare Other | Admitting: Internal Medicine

## 2017-10-05 DIAGNOSIS — E11622 Type 2 diabetes mellitus with other skin ulcer: Secondary | ICD-10-CM | POA: Diagnosis not present

## 2017-10-07 NOTE — Progress Notes (Signed)
Zoe Robinson, Zoe Robinson ([id number]) Visit Report for 10/05/2017 Debridement Details Patient Name: Zoe Robinson, Zoe Robinson Date of Service: 10/05/2017 1:30 PM Medical Record Number: [id number] Patient Account Number: [id number] Date of Birth/Sex: June 22, 1932 (85 y.o. Female) Treating RN: Huel Coventry Primary Care Provider: Gabriel Cirri Other Clinician: Referring Provider: Gabriel Cirri Treating Provider/Extender: Maxwell Caul Weeks in Treatment: 4 Debridement Performed for Wound #1 Left,Medial Lower Leg Assessment: Performed By: Physician Maxwell Caul, MD Debridement: Debridement Severity of Tissue Pre Fat layer exposed Debridement: Pre-procedure Verification/Time Yes - 14:15 Out Taken: Start Time: 14:15 Pain Control: Other : lidocaine 4% Level: Skin/Subcutaneous Tissue Total Area Debrided (L x W): 3 (cm) x 1.8 (cm) = 5.4 (cm) Tissue and other material Viable, Non-Viable, Fibrin/Slough, Subcutaneous debrided: Instrument: Curette Bleeding: Moderate Hemostasis Achieved: Silver Nitrate End Time: 14:16 Procedural Pain: 3 Post Procedural Pain: 3 Response to Treatment: Procedure was tolerated well Post Debridement Measurements of Total Wound Length: (cm) 3 Width: (cm) 1.8 Depth: (cm) 0.3 Volume: (cm) 1.272 Character of Wound/Ulcer Post Debridement: Requires Further Debridement Severity of Tissue Post Debridement: Fat layer exposed Post Procedure Diagnosis Same as Pre-procedure Electronic Signature(s) Signed: 10/05/2017 5:38:59 PM By: Elliot Gurney, BSN, RN, CWS, Kim RN, BSN Signed: 10/05/2017 5:42:24 PM By: Baltazar Najjar MD Entered By: Baltazar Najjar on 10/05/2017 14:45:37 Zoe Robinson ([id number]) -------------------------------------------------------------------------------- HPI Details Patient Name: Zoe Robinson Date of Service: 10/05/2017 1:30 PM Medical Record Number: [id number] Patient Account Number: [id number] Date of Birth/Sex: 03-13-1932 (85 y.o.  Female) Treating RN: Huel Coventry Primary Care Provider: Gabriel Cirri Other Clinician: Referring Provider: Gabriel Cirri Treating Provider/Extender: Altamese Seabrook in Treatment: 4 History of Present Illness HPI Description: 09/07/17; this is an 84 year old woman who arrives accompanied by her daughter. She has a history of severe rheumatoid arthritis followed by Dr. Corliss Skains in [place]. By review of care everywhere it appears that she had cellulitis of her left lower leg in September 2018. This may have been caused by a dog scratch. She was followed and her primary care office at [facility] affiliated group. She received antibiotics, Silvadene cream as well as Una boots. This was followed through late October and the wound was healed out on 10/31. The patient and her daughters state that the current wound started with a scissor injury to the medial left calf before the wound was actually healed out in other words this is a different wound area. The original wound was more medial on the left anterior tibial area. Since then they've been using Silvadene cream and apparently she changed doctors who recommended Polysporin and they've been referred here for the injury on her left medial calf. She does not have a history of rheumatoid-related skin issues/vasculitis The patient is a type II diabetic with most recent hemoglobin A1c of 6. She has advanced seropositive rheumatoid arthritis on prednisone 10 mg alternating with 5 and methotrexate. I see she is also on xarelto while Im not really sure why. She has significant osteoporosis, history of hypercalcemia. ABI in this clinic is 1.08. She was a former smoker 09/14/17; patient's wound slightly smaller today however still covered in a very thick adherent necrotic surface. We have been using Santyl under compression. We finally got home health to start yesterday "advanced" 09/21/17; not too much change in the area here. We have been using Santyl  under compression and I'll continue this after debridement today. Consider Iodoflex if we don't get a better looking wound surface 09/22/17-she presents today as an urgent follow-up secondary to uncontrolled bleeding overnight. She has saturated  through the 3-layer compression. Upon dressing removal there is significant clot formation, she continues to bleed from the most proximal aspect of the ulcer. 4 silver nitrate sticks were used to try to obtain hemostasis, along with 10 minutes of pressure dressing. The wound continues to bleed although not as briskly; this appears to be more of a venous bleed and arterial. She is hemodynamically stable upon presentation to the clinic SBP 130's, HR 80's with no complaints of dizziness, lightheadedness. We are unable to obtain any hemostatic agents from the emergency department. Since she has bled through the pressure dressing and continues to bleed despite all efforts available in the clinic she has been sent to the emergency department for evaluation and treatment 09/28/17; the patient has not had an easy time since the last time I saw her. She continues to have nonviable surface over the wound perhaps slightly better using Santyl. She has on xarelto which no doubt contributes to the bleeding after debridement. I'm going to change her to Iodoflex today. She is having a lot of discomfort with this we gave her tramadol which is reasonable 10/05/17;patient continues to have nonviable surface over a very difficult wound.we also note that her propensity to bleed. We have been using Iodoflex. Abdomen minimal response previously to Santyl. She tells me that her rheumatologist is made adjustments to both her prednisone and methotrexate in attempt to help heal this difficult area. One would wonder if this is all venous insufficiency and whether the rheumatoid arthritis itself could be contributing to an inflammatory ulcer/rheumatoid vasculitis Electronic  Signature(s) Signed: 10/05/2017 5:42:24 PM By: Baltazar Najjar MD Entered By: Baltazar Najjar on 10/05/2017 14:47:54 Zoe Robinson ([id number]) -------------------------------------------------------------------------------- Physical Exam Details Patient Name: Zoe Robinson Date of Service: 10/05/2017 1:30 PM Medical Record Number: [id number] Patient Account Number: [id number] Date of Birth/Sex: 1931/12/10 (85 y.o. Female) Treating RN: Huel Coventry Primary Care Provider: Gabriel Cirri Other Clinician: Referring Provider: Gabriel Cirri Treating Provider/Extender: Maxwell Caul Weeks in Treatment: 4 Constitutional Sitting or standing Blood Pressure is within target range for patient.. Pulse regular and within target range for patient.Marland Kitchen Respirations regular, non-labored and within target range.. Temperature is normal and within the target range for the patient.Marland Kitchen appears in no distress. Notes wound exam; still a nonviable surface using a #5 curet removed necrotic debris,, surface fat. Still not down to a viable surface. She had slight bleeding and given previous problems icauterized this with silver nitrate Electronic Signature(s) Signed: 10/05/2017 5:42:24 PM By: Baltazar Najjar MD Entered By: Baltazar Najjar on 10/05/2017 14:48:57 Zoe Robinson ([id number]) -------------------------------------------------------------------------------- Physician Orders Details Patient Name: Zoe Robinson Date of Service: 10/05/2017 1:30 PM Medical Record Number: [id number] Patient Account Number: [id number] Date of Birth/Sex: 29-Oct-1931 (85 y.o. Female) Treating RN: Huel Coventry Primary Care Provider: Gabriel Cirri Other Clinician: Referring Provider: Gabriel Cirri Treating Provider/Extender: Altamese Orwell in Treatment: 4 Verbal / Phone Orders: No Diagnosis Coding Wound Cleansing Wound #1 Left,Medial Lower Leg o Cleanse wound with mild soap and water Anesthetic  (add to Medication List) Wound #1 Left,Medial Lower Leg o Topical Lidocaine 4% cream applied to wound bed prior to debridement (In Clinic Only). Skin Barriers/Peri-Wound Care Wound #1 Left,Medial Lower Leg o Barrier cream o Moisturizing lotion Primary Wound Dressing Wound #1 Left,Medial Lower Leg o Iodoflex Secondary Dressing Wound #1 Left,Medial Lower Leg o ABD pad Dressing Change Frequency Wound #1 Left,Medial Lower Leg o Change Dressing Monday, Wednesday, Friday Follow-up Appointments Wound #1 Left,Medial Lower Leg o Return Appointment in 1 week.  Edema Control Wound #1 Left,Medial Lower Leg o 3 Layer Compression System - Left Lower Extremity o Elevate legs to the level of the heart and pump ankles as often as possible Home Health Wound #1 Left,Medial Lower Leg o Continue Home Health Visits o Home Health Nurse may visit PRN to address patientos wound care needs. o FACE TO FACE ENCOUNTER: MEDICARE and MEDICAID PATIENTS: I certify that this patient is under my care and that I had a face-to-face encounter that meets the physician face-to-face encounter requirements with this patient on this date. The encounter with the patient was in whole or in part for the following MEDICAL Zoe Robinson, Zoe Robinson ([id number]) CONDITION: (primary reason for Home Healthcare) MEDICAL NECESSITY: I certify, that based on my findings, NURSING services are a medically necessary home health service. HOME BOUND STATUS: I certify that my clinical findings support that this patient is homebound (i.e., Due to illness or injury, pt requires aid of supportive devices such as crutches, cane, wheelchairs, walkers, the use of special transportation or the assistance of another person to leave their place of residence. There is a normal inability to leave the home and doing so requires considerable and taxing effort. Other absences are for medical reasons / religious services and are infrequent  or of short duration when for other reasons). o If current dressing causes regression in wound condition, may D/C ordered dressing product/s and apply Normal Saline Moist Dressing daily until next Wound Healing Center / Other MD appointment. Notify Wound Healing Center of regression in wound condition at [phone]. o Please direct any NON-WOUND related issues/requests for orders to patient's Primary Care Physician Patient Medications Allergies: No Known Allergies Notifications Medication Indication Start End lidocaine DOSE topical 4 % cream - cream topical Electronic Signature(s) Signed: 10/05/2017 5:38:59 PM By: Elliot Gurney, BSN, RN, CWS, Kim RN, BSN Signed: 10/05/2017 5:42:24 PM By: Baltazar Najjar MD Entered By: Elliot Gurney, BSN, RN, CWS, Kim on 10/05/2017 14:17:26 Zoe Robinson, Zoe Robinson ([id number]) -------------------------------------------------------------------------------- Prescription 10/05/2017 Patient Name: Zoe Robinson Provider: Maxwell Caul MD Date of Birth: 03/15/1932 NPI#: [id number] Sex: F DEA#: [id number] Phone #: [phone] License #: [id number] Patient Address: [facility] [address] [facility] [place], [place] [address] [address] [place], [place] [address] [phone] Allergies No Known Allergies Medication Medication: Route: Strength: Form: lidocaine topical 4% cream Class: TOPICAL LOCAL ANESTHETICS Dose: Frequency / Time: Indication: cream topical Number of Refills: Number of Units: 0 Generic Substitution: Start Date: End Date: Administered at Substitution Permitted Facility: No Note to Pharmacy: Signature(s): Date(s): Electronic Signature(s) Signed: 10/05/2017 5:38:59 PM By: Elliot Gurney, BSN, RN, CWS, Kim RN, BSN Signed: 10/05/2017 5:42:24 PM By: Baltazar Najjar MD Entered By: Elliot Gurney, BSN, RN, CWS, Kim on 10/05/2017 14:17:27 Zoe Robinson  ([id number]) --------------------------------------------------------------------------------  Problem List Details Patient Name: Zoe Robinson Date of Service: 10/05/2017 1:30 PM Medical Record Number: [id number] Patient Account Number: [id number] Date of Birth/Sex: 05-09-1932 (85 y.o. 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 09/07/2017 Yes I87.312 Chronic venous hypertension (idiopathic) with ulcer of left lower 09/07/2017 Yes extremity M05.80 Other rheumatoid arthritis with rheumatoid factor of unspecified site 09/07/2017 Yes Inactive Problems Resolved Problems Electronic Signature(s) Signed: 10/05/2017 5:42:24 PM By: Baltazar Najjar MD Entered By: Baltazar Najjar on 10/05/2017 14:45:15 Zoe Robinson, Zoe Robinson (  [id number]) -------------------------------------------------------------------------------- Progress Note Details Patient Name: Zoe Robinson, Zoe Robinson Date of Service: 10/05/2017 1:30 PM Medical Record Number: [id number] Patient Account Number: [id number] Date of Birth/Sex: 02-19-1932 (85 y.o. Female) Treating RN: Huel Coventry Primary Care Provider: Gabriel Cirri Other Clinician: Referring Provider: Gabriel Cirri Treating Provider/Extender: Maxwell Caul Weeks in Treatment: 4 Subjective History of Present Illness (HPI) 09/07/17; this is an 84 year old woman who arrives accompanied by her daughter. She has a history of severe rheumatoid arthritis followed by Dr. Corliss Skains in [place]. By review of care everywhere it appears that she had cellulitis of her left lower leg in September 2018. This may have been caused by a dog scratch. She was followed and her primary care office at [facility]  affiliated group. She received antibiotics, Silvadene cream as well as Una boots. This was followed through late October and the wound was healed out on 10/31. The patient and her daughters state that the current wound started with a scissor injury to the medial left calf before the wound was actually healed out in other words this is a different wound area. The original wound was more medial on the left anterior tibial area. Since then they've been using Silvadene cream and apparently she changed doctors who recommended Polysporin and they've been referred here for the injury on her left medial calf. She does not have a history of rheumatoid-related skin issues/vasculitis The patient is a type II diabetic with most recent hemoglobin A1c of 6. She has advanced seropositive rheumatoid arthritis on prednisone 10 mg alternating with 5 and methotrexate. I see she is also on xarelto while Im not really sure why. She has significant osteoporosis, history of hypercalcemia. ABI in this clinic is 1.08. She was a former smoker 09/14/17; patient's wound slightly smaller today however still covered in a very thick adherent necrotic surface. We have been using Santyl under compression. We finally got home health to start yesterday "advanced" 09/21/17; not too much change in the area here. We have been using Santyl under compression and I'll continue this after debridement today. Consider Iodoflex if we don't get a better looking wound surface 09/22/17-she presents today as an urgent follow-up secondary to uncontrolled bleeding overnight. She has saturated through the 3-layer compression. Upon dressing removal there is significant clot formation, she continues to bleed from the most proximal aspect of the ulcer. 4 silver nitrate sticks were used to try to obtain hemostasis, along with 10 minutes of pressure dressing. The wound continues to bleed although not as briskly; this appears to be more of a venous bleed and  arterial. She is hemodynamically stable upon presentation to the clinic SBP 130's, HR 80's with no complaints of dizziness, lightheadedness. We are unable to obtain any hemostatic agents from the emergency department. Since she has bled through the pressure dressing and continues to bleed despite all efforts available in the clinic she has been sent to the emergency department for evaluation and treatment 09/28/17; the patient has not had an easy time since the last time I saw her. She continues to have nonviable surface over the wound perhaps slightly better using Santyl. She has on xarelto which no doubt contributes to the bleeding after debridement. I'm going to change her to Iodoflex today. She is having a lot of discomfort with this we gave her tramadol which is reasonable 10/05/17;patient continues to have nonviable surface over a very difficult wound.we also note that her propensity to bleed. We have been using Iodoflex. Abdomen minimal response previously to Santyl. She  tells me that her rheumatologist is made adjustments to both her prednisone and methotrexate in attempt to help heal this difficult area. One would wonder if this is all venous insufficiency and whether the rheumatoid arthritis itself could be contributing to an inflammatory ulcer/rheumatoid vasculitis Objective Zoe Robinson, Zoe Robinson ([id number]) Constitutional Sitting or standing Blood Pressure is within target range for patient.. Pulse regular and within target range for patient.Marland Kitchen Respirations regular, non-labored and within target range.. Temperature is normal and within the target range for the patient.Marland Kitchen appears in no distress. Vitals Time Taken: 1:44 PM, Height: 59 in, Weight: 117 lbs, BMI: 23.6, Pulse: 98.3 bpm, Respiratory Rate: 16 breaths/min, Blood Pressure: 132/61 mmHg. General Notes: wound exam; still a nonviable surface using a #5 curet removed necrotic debris,, surface fat. Still not down to a viable surface. She  had slight bleeding and given previous problems icauterized this with silver nitrate Integumentary (Hair, Skin) Wound #1 status is Open. Original cause of wound was Trauma. The wound is located on the Left,Medial Lower Leg. The wound measures 3cm length x 1.8cm width x 0.3cm depth; 4.241cm^2 area and 1.272cm^3 volume. There is Fat Layer (Subcutaneous Tissue) Exposed exposed. There is no tunneling or undermining noted. There is a large amount of serosanguineous drainage noted. The wound margin is flat and intact. There is no granulation within the wound bed. There is a large (67-100%) amount of necrotic tissue within the wound bed including Eschar and Adherent Slough. The periwound skin appearance exhibited: Excoriation. The periwound skin appearance did not exhibit: Callus, Crepitus, Induration, Rash, Scarring, Dry/Scaly, Maceration, Atrophie Blanche, Cyanosis, Ecchymosis, Hemosiderin Staining, Mottled, Pallor, Rubor, Erythema. Periwound temperature was noted as No Abnormality. The periwound has tenderness on palpation. Assessment Active Problems ICD-10 L97.223 - 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 muscle I87.312 - Chronic venous hypertension (idiopathic) with ulcer of left lower extremity M05.80 - Other rheumatoid arthritis with rheumatoid factor of unspecified site Procedures Wound #1 Pre-procedure diagnosis of Wound #1 is a Venous Leg Ulcer located on the Left,Medial Lower Leg .Severity of Tissue Pre Debridement is: Fat layer exposed. There was a Skin/Subcutaneous Tissue Debridement ([id number]) debridement with total area of 5.4 sq cm performed by Maxwell Caul, MD. with the following instrument(s): Curette to remove Viable and Non-Viable tissue/material including Fibrin/Slough and Subcutaneous after achieving pain control using Other (lidocaine 4%). A time out was conducted at 14:15, prior to the start of the procedure. A Moderate amount of bleeding was  controlled with Silver Nitrate. The procedure was tolerated well with a pain level of 3 throughout and a pain level of 3 following the procedure. Post Debridement Measurements: 3cm length x 1.8cm width x 0.3cm depth; 1.272cm^3 volume. Character of Wound/Ulcer Post Debridement requires further debridement. Severity of Tissue Post Debridement is: Fat layer exposed. Post procedure Diagnosis Wound #1: Same as Pre-Procedure Zoe Robinson, Zoe Robinson ([id number]) Plan Wound Cleansing: Wound #1 Left,Medial Lower Leg: Cleanse wound with mild soap and water Anesthetic (add to Medication List): Wound #1 Left,Medial Lower Leg: Topical Lidocaine 4% cream applied to wound bed prior to debridement (In Clinic Only). Skin Barriers/Peri-Wound Care: Wound #1 Left,Medial Lower Leg: Barrier cream Moisturizing lotion Primary Wound Dressing: Wound #1 Left,Medial Lower Leg: Iodoflex Secondary Dressing: Wound #1 Left,Medial Lower Leg: ABD pad Dressing Change Frequency: Wound #1 Left,Medial Lower Leg: Change Dressing Monday, Wednesday, Friday Follow-up Appointments: Wound #1 Left,Medial Lower Leg: Return Appointment in 1 week. Edema Control: Wound #1 Left,Medial Lower Leg: 3 Layer Compression System - Left Lower Extremity Elevate  legs to the level of the heart and pump ankles as often as possible Home Health: Wound #1 Left,Medial Lower Leg: Continue Home Health Visits Home Health Nurse may visit PRN to address patient s wound care needs. FACE TO FACE ENCOUNTER: MEDICARE and MEDICAID PATIENTS: I certify that this patient is under my care and that I had a face-to-face encounter that meets the physician face-to-face encounter requirements with this patient on this date. The encounter with the patient was in whole or in part for the following MEDICAL CONDITION: (primary reason for Home Healthcare) MEDICAL NECESSITY: I certify, that based on my findings, NURSING services are a medically necessary home health  service. HOME BOUND STATUS: I certify that my clinical findings support that this patient is homebound (i.e., Due to illness or injury, pt requires aid of supportive devices such as crutches, cane, wheelchairs, walkers, the use of special transportation or the assistance of another person to leave their place of residence. There is a normal inability to leave the home and doing so requires considerable and taxing effort. Other absences are for medical reasons / religious services and are infrequent or of short duration when for other reasons). If current dressing causes regression in wound condition, may D/C ordered dressing product/s and apply Normal Saline Moist Dressing daily until next Wound Healing Center / Other MD appointment. Notify Wound Healing Center of regression in wound condition at [phone]. Please direct any NON-WOUND related issues/requests for orders to patient's Primary Care Physician The following medication(s) was prescribed: lidocaine topical 4 % cream cream topical #1 continue Iodoflex although home health did not have this to apply when they're out last week Zoe Robinson, Zoe Robinson ([id number]) #2 likely to continue very careful mechanical debridement. Electronic Signature(s) Signed: 10/05/2017 5:42:24 PM By: Baltazar Najjar MD Entered By: Baltazar Najjar on 10/05/2017 14:49:38 Zoe Robinson ([id number]) -------------------------------------------------------------------------------- SuperBill Details Patient Name: Zoe Robinson Date of Service: 10/05/2017 Medical Record Number: [id number] Patient Account Number: [id number] Date of Birth/Sex: 03-11-1932 (85 y.o. Female) Treating RN: Huel Coventry Primary Care Provider: Gabriel Cirri Other Clinician: Referring Provider: Gabriel Cirri Treating Provider/Extender: Maxwell Caul Weeks in Treatment: 4 Diagnosis Coding ICD-10 Codes Code Description [phone] 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  muscle I87.312 Chronic venous hypertension (idiopathic) with ulcer of left lower extremity M05.80 Other rheumatoid arthritis with rheumatoid factor of unspecified site Facility Procedures CPT4 Code: [id number] Description: 11042 - DEB SUBQ TISSUE 20 SQ CM/< ICD-10 Diagnosis Description L97.223 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 musc I87.312 Chronic venous hypertension (idiopathic) with ulcer of left l Modifier: le ower extremity Quantity: 1 Physician Procedures CPT4 Code: [id number] Description: 11042 - WC PHYS SUBQ TISS 20 SQ CM ICD-10 Diagnosis Description L97.223 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 musc I87.312 Chronic venous hypertension (idiopathic) with ulcer of left l Modifier: le ower extremity Quantity: 1 Electronic Signature(s) Signed: 10/05/2017 5:42:24 PM By: Baltazar Najjar MD Entered By: Baltazar Najjar on 10/05/2017 14:50:03

## 2017-10-07 NOTE — Progress Notes (Signed)
MARYETTE, WOLBERT ([id number]) Visit Report for 10/05/2017 Arrival Information Details Patient Name: AUGUSTINA, ISHIHARA Date of Service: 10/05/2017 1:30 PM Medical Record Number: [id number] Patient Account Number: [id number] Date of Birth/Sex: 10-04-1931 (85 y.o. Female) Treating RN: Ashok Cordia, Debi Primary Care Mete Purdum: Gabriel Cirri Other Clinician: Referring Josefina Rynders: Gabriel Cirri Treating Palmyra Rogacki/Extender: Altamese [place] in Treatment: 4 Visit Information History Since Last Visit All ordered tests and consults were completed: No Patient Arrived: Ambulatory Added or deleted any medications: No Arrival Time: 13:40 Any new allergies or adverse reactions: No Accompanied By: daughter Had a fall or experienced change in No Transfer Assistance: None activities of daily living that may affect Patient Identification Verified: Yes risk of falls: Secondary Verification Process Yes Signs or symptoms of abuse/neglect since last visito No Completed: Hospitalized since last visit: No Patient Has Alerts: Yes Has Dressing in Place as Prescribed: Yes Patient Alerts: Patient on Blood Has Compression in Place as Prescribed: Yes Thinner xarelto Pain Present Now: Yes Electronic Signature(s) Signed: 10/05/2017 4:40:08 PM By: Alejandro Mulling Entered By: Alejandro Mulling on 10/05/2017 13:41:48 [name] ([id number]) -------------------------------------------------------------------------------- Encounter Discharge Information Details Patient Name: [name] Date of Service: 10/05/2017 1:30 PM Medical Record Number: [id number] Patient Account Number: [id number] Date of Birth/Sex: 1931-09-09 (85 y.o. Female) Treating RN: Huel Coventry Primary Care Kortlyn Koltz: Gabriel Cirri Other Clinician: Referring Lavene Penagos: Gabriel Cirri Treating Cruzita Lipa/Extender: Altamese  in Treatment: 4 Encounter Discharge Information Items Discharge Pain Level: 0 Discharge  Condition: Stable Ambulatory Status: Ambulatory Discharge Destination: Home Transportation: Private Auto Accompanied By: dtr Schedule Follow-up Appointment: Yes Medication Reconciliation completed and No provided to Patient/Care Sherika Kubicki: Provided on Clinical Summary of Care: 10/05/2017 Form Type Recipient Paper Patient JK Electronic Signature(s) Signed: 10/05/2017 5:19:26 PM By: Curtis Sites Entered By: Curtis Sites on 10/05/2017 14:39:05 [name] ([id number]) -------------------------------------------------------------------------------- Lower Extremity Assessment Details Patient Name: [name] Date of Service: 10/05/2017 1:30 PM Medical Record Number: [id number] Patient Account Number: [id number] Date of Birth/Sex: January 24, 1932 (85 y.o. Female) Treating RN: Ashok Cordia, Debi Primary Care Zymere Patlan: Gabriel Cirri Other Clinician: Referring Evangelos Paulino: Gabriel Cirri Treating Lorien Shingler/Extender: Maxwell Caul Weeks in Treatment: 4 Edema Assessment Assessed: [Left: No] [Right: No] [Left: Edema] [Right: :] Calf Left: Right: Point of Measurement: 29 cm From Medial Instep 30 cm cm Ankle Left: Right: Point of Measurement: 10 cm From Medial Instep 17.8 cm cm Vascular Assessment Pulses: Dorsalis Pedis Palpable: [Left:Yes] Posterior Tibial Extremity colors, hair growth, and conditions: Extremity Color: [Left:Mottled] Temperature of Extremity: [Left:Warm] Capillary Refill: [Left:> 3 seconds] Toe Nail Assessment Left: Right: Thick: Yes Discolored: Yes Deformed: Yes Improper Length and Hygiene: Yes Electronic Signature(s) Signed: 10/05/2017 4:40:08 PM By: Alejandro Mulling Entered By: Alejandro Mulling on 10/05/2017 13:53:01 [name] ([id number]) -------------------------------------------------------------------------------- Multi Wound Chart Details Patient Name: [name] Date of Service: 10/05/2017 1:30 PM Medical Record Number:  [id number] Patient Account Number: [id number] Date of Birth/Sex: [birth year] (85 y.o. Female) Treating RN: Huel Coventry Primary Care Nica Friske: Gabriel Cirri Other Clinician: Referring Kristiane Morsch: Gabriel Cirri Treating Alton Bouknight/Extender: Maxwell Caul Weeks in Treatment: 4 Vital Signs Height(in): 59 Pulse(bpm): 98.3 Weight(lbs): 117 Blood Pressure(mmHg): 132/61 Body Mass Index(BMI): 24 Temperature(F): Respiratory Rate 16 (breaths/min): Photos: [1:No Photos] [N/A:N/A] Wound Location: [1:Left Lower Leg - Medial] [N/A:N/A] Wounding Event: [1:Trauma] [N/A:N/A] Primary Etiology: [1:Venous Leg Ulcer] [N/A:N/A] Comorbid History: [1:Cataracts, Anemia, Arrhythmia, Hypertension, Rheumatoid Arthritis, Osteoarthritis] [N/A:N/A] Date Acquired: [1:05/02/2017] [N/A:N/A] Weeks of Treatment: [1:4] [N/A:N/A] Wound Status: [1:Open] [N/A:N/A] Measurements L x W x D [1:3x1.8x0.3] [N/A:N/A] (cm) Area (cm) : [1:4.241] [N/A:N/A] Volume (  cm) : [1:1.272] [N/A:N/A] % Reduction in Area: [1:43.90%] [N/A:N/A] % Reduction in Volume: [1:-68.30%] [N/A:N/A] Classification: [1:Full Thickness Without Exposed Support Structures] [N/A:N/A] Exudate Amount: [1:Large] [N/A:N/A] Exudate Type: [1:Serosanguineous] [N/A:N/A] Exudate Color: [1:red, brown] [N/A:N/A] Wound Margin: [1:Flat and Intact] [N/A:N/A] Granulation Amount: [1:None Present (0%)] [N/A:N/A] Necrotic Amount: [1:Large (67-100%)] [N/A:N/A] Necrotic Tissue: [1:Eschar, Adherent Slough] [N/A:N/A] Exposed Structures: [1:Fat Layer (Subcutaneous Tissue) Exposed: Yes Fascia: No Tendon: No Muscle: No Joint: No Bone: No] [N/A:N/A] Epithelialization: [1:Small (1-33%)] [N/A:N/A] Debridement: [1:Debridement (11042-11047)] [N/A:N/A] Pre-procedure [1:14:15] [N/A:N/A] Verification/Time Out Taken: CEDELLA, PASCARELLA ([id number]) Pain Control: Other N/A N/A Tissue Debrided: Fibrin/Slough, Subcutaneous N/A N/A Level: Skin/Subcutaneous Tissue N/A  N/A Debridement Area (sq cm): 5.4 N/A N/A Instrument: Curette N/A N/A Bleeding: Moderate N/A N/A Hemostasis Achieved: Silver Nitrate N/A N/A Procedural Pain: 3 N/A N/A Post Procedural Pain: 3 N/A N/A Debridement Treatment Procedure was tolerated well N/A N/A Response: Post Debridement 3x1.8x0.3 N/A N/A Measurements L x W x D (cm) Post Debridement Volume: 1.272 N/A N/A (cm) Periwound Skin Texture: Excoriation: Yes N/A N/A Induration: No Callus: No Crepitus: No Rash: No Scarring: No Periwound Skin Moisture: Maceration: No N/A N/A Dry/Scaly: No Periwound Skin Color: Atrophie Blanche: No N/A N/A Cyanosis: No Ecchymosis: No Erythema: No Hemosiderin Staining: No Mottled: No Pallor: No Rubor: No Temperature: No Abnormality N/A N/A Tenderness on Palpation: Yes N/A N/A Wound Preparation: Ulcer Cleansing: Other: soap N/A N/A and water Topical Anesthetic Applied: Other: lidocaine 4% Procedures Performed: Debridement N/A N/A Treatment Notes Wound #1 (Left, Medial Lower Leg) 1. Cleansed with: Cleanse wound with antibacterial soap and water 2. Anesthetic Topical Lidocaine 4% cream to wound bed prior to debridement 3. Peri-wound Care: Moisturizing lotion 4. Dressing Applied: Iodoflex 5. Secondary Dressing Applied ABD Pad 7. Secured with 3 Layer Compression System - Left Lower Extremity SHADIAMOND, LEGATE ([id number]) Electronic Signature(s) Signed: 10/05/2017 5:42:24 PM By: Baltazar Najjar MD Entered By: Baltazar Najjar on 10/05/2017 14:45:26 [name] ([id number]) -------------------------------------------------------------------------------- Multi-Disciplinary Care Plan Details Patient Name: [name] Date of Service: 10/05/2017 1:30 PM Medical Record Number: [id number] Patient Account Number: [id number] Date of Birth/Sex: Jun 03, 1932 (85 y.o. Female) Treating RN: Huel Coventry Primary Care Gary Gabrielsen: Gabriel Cirri Other Clinician: Referring Ahnika Hannibal:  Gabriel Cirri Treating Saabir Blyth/Extender: Altamese [place] in Treatment: 4 Active Inactive ` Abuse / Safety / Falls / Self Care Management Nursing Diagnoses: Potential for falls Goals: Patient will not experience any injury related to falls Date Initiated: 09/07/2017 Target Resolution Date: 12/03/2017 Goal Status: Active Interventions: Assess fall risk on admission and as needed Notes: ` Orientation to the Wound Care Program Nursing Diagnoses: Knowledge deficit related to the wound healing center program Goals: Patient/caregiver will verbalize understanding of the Wound Healing Center Program Date Initiated: 09/07/2017 Target Resolution Date: 12/03/2017 Goal Status: Active Interventions: Provide education on orientation to the wound center Notes: ` Wound/Skin Impairment Nursing Diagnoses: Impaired tissue integrity Goals: Ulcer/skin breakdown will heal within 14 weeks Date Initiated: 09/07/2017 Target Resolution Date: 12/03/2017 Goal Status: Active Interventions: CHAELYNN, DOONEY ([id number]) Assess patient/caregiver ability to obtain necessary supplies Assess patient/caregiver ability to perform ulcer/skin care regimen upon admission and as needed Assess ulceration(s) every visit Notes: Electronic Signature(s) Signed: 10/05/2017 5:38:59 PM By: Elliot Gurney, BSN, RN, CWS, Kim RN, BSN Entered By: Elliot Gurney, BSN, RN, CWS, Kim on 10/05/2017 14:14:40 [name] ([id number]) -------------------------------------------------------------------------------- Pain Assessment Details Patient Name: [name] Date of Service: 10/05/2017 1:30 PM Medical Record Number: [id number] Patient Account Number: [id number] Date of Birth/Sex: 1932/02/21 (85 y.o. Female) Treating RN: Ashok Cordia, Debi Primary  Care Rilen Shukla: Gabriel Cirri Other Clinician: Referring Jawon Dipiero: Gabriel Cirri Treating Jeidy Hoerner/Extender: Altamese [place] in Treatment: 4 Active Problems Location  of Pain Severity and Description of Pain Patient Has Paino Yes Site Locations Pain Location: Pain in Ulcers Rate the pain. Current Pain Level: 3 Character of Pain Describe the Pain: Burning Pain Management and Medication Current Pain Management: Notes Topical or injectable lidocaine is offered to patient for acute pain when surgical debridement is performed. If needed, Patient is instructed to use over the counter pain medication for the following 24-48 hours after debridement. Wound care MDs do not prescribed pain medications. Patient has chronic pain or uncontrolled pain. Patient has been instructed to make an appointment with their Primary Care Physician for pain management. Electronic Signature(s) Signed: 10/05/2017 4:40:08 PM By: Alejandro Mulling Entered By: Alejandro Mulling on 10/05/2017 13:42:13 [name] ([id number]) -------------------------------------------------------------------------------- Patient/Caregiver Education Details Patient Name: [name] Date of Service: 10/05/2017 1:30 PM Medical Record Number: [id number] Patient Account Number: [id number] Date of Birth/Gender: 1932/03/05 (85 y.o. Female) Treating RN: Curtis Sites Primary Care Physician: Gabriel Cirri Other Clinician: Referring Physician: Gabriel Cirri Treating Physician/Extender: Altamese  in Treatment: 4 Education Assessment Education Provided To: Patient and Caregiver Education Topics Provided Venous: Handouts: Other: leg elevation and wrap precautions Methods: Explain/Verbal Responses: State content correctly Electronic Signature(s) Signed: 10/05/2017 5:19:26 PM By: Curtis Sites Entered By: Curtis Sites on 10/05/2017 14:39:32 [name] ([id number]) -------------------------------------------------------------------------------- Wound Assessment Details Patient Name: [name] Date of Service: 10/05/2017 1:30 PM Medical Record Number:  [id number] Patient Account Number: [id number] Date of Birth/Sex: 1932-03-18 (85 y.o. Female) Treating RN: Ashok Cordia, Debi Primary Care Tasmin Exantus: Gabriel Cirri Other Clinician: Referring Adah Stoneberg: Gabriel Cirri Treating Jupiter Kabir/Extender: Maxwell Caul Weeks in Treatment: 4 Wound Status Wound Number: 1 Primary Venous Leg Ulcer Etiology: Wound Location: Left Lower Leg - Medial Wound Open Wounding Event: Trauma Status: Date Acquired: 05/02/2017 Comorbid Cataracts, Anemia, Arrhythmia, Hypertension, Weeks Of Treatment: 4 History: Rheumatoid Arthritis, Osteoarthritis Clustered Wound: No Photos Photo Uploaded By: Alejandro Mulling on 10/05/2017 17:16:19 Wound Measurements Length: (cm) 3 Width: (cm) 1.8 Depth: (cm) 0.3 Area: (cm) 4.241 Volume: (cm) 1.272 % Reduction in Area: 43.9% % Reduction in Volume: -68.3% Epithelialization: Small (1-33%) Tunneling: No Undermining: No Wound Description Full Thickness Without Exposed Support Classification: Structures Wound Margin: Flat and Intact Exudate Large Amount: Exudate Type: Serosanguineous Exudate Color: red, brown Foul Odor After Cleansing: No Slough/Fibrino Yes Wound Bed Granulation Amount: None Present (0%) Exposed Structure Necrotic Amount: Large (67-100%) Fascia Exposed: No Necrotic Quality: Eschar, Adherent Slough Fat Layer (Subcutaneous Tissue) Exposed: Yes Tendon Exposed: No Muscle Exposed: No Joint Exposed: No Bone Exposed: No Kobashigawa, Trenell ([id number]) Periwound Skin Texture Texture Color No Abnormalities Noted: No No Abnormalities Noted: No Callus: No Atrophie Blanche: No Crepitus: No Cyanosis: No Excoriation: Yes Ecchymosis: No Induration: No Erythema: No Rash: No Hemosiderin Staining: No Scarring: No Mottled: No Pallor: No Moisture Rubor: No No Abnormalities Noted: No Dry / Scaly: No Temperature / Pain Maceration: No Temperature: No Abnormality Tenderness on Palpation:  Yes Wound Preparation Ulcer Cleansing: Other: soap and water, Topical Anesthetic Applied: Other: lidocaine 4%, Treatment Notes Wound #1 (Left, Medial Lower Leg) 1. Cleansed with: Cleanse wound with antibacterial soap and water 2. Anesthetic Topical Lidocaine 4% cream to wound bed prior to debridement 3. Peri-wound Care: Moisturizing lotion 4. Dressing Applied: Iodoflex 5. Secondary Dressing Applied ABD Pad 7. Secured with 3 Layer Compression System - Left Lower Extremity Electronic Signature(s) Signed: 10/05/2017 4:40:08 PM By: Ashok Cordia,  Debra Entered By: Alejandro Mulling on 10/05/2017 13:51:20 [name] ([id number]) -------------------------------------------------------------------------------- Vitals Details Patient Name: [name] Date of Service: 10/05/2017 1:30 PM Medical Record Number: [id number] Patient Account Number: [id number] Date of Birth/Sex: [birth year] (85 y.o. Female) Treating RN: Ashok Cordia, Debi Primary Care Chauntay Paszkiewicz: Gabriel Cirri Other Clinician: Referring Angelus Hoopes: Gabriel Cirri Treating Fable Huisman/Extender: Maxwell Caul Weeks in Treatment: 4 Vital Signs Time Taken: 13:44 Pulse (bpm): 98.3 Height (in): 59 Respiratory Rate (breaths/min): 16 Weight (lbs): 117 Blood Pressure (mmHg): 132/61 Body Mass Index (BMI): 23.6 Reference Range: 80 - 120 mg / dl Electronic Signature(s) Signed: 10/05/2017 4:40:08 PM By: Alejandro Mulling Entered By: Alejandro Mulling on 10/05/2017 13:44:31

## 2017-10-12 ENCOUNTER — Encounter: Payer: Medicare Other | Admitting: Nurse Practitioner

## 2017-10-12 DIAGNOSIS — E11622 Type 2 diabetes mellitus with other skin ulcer: Secondary | ICD-10-CM | POA: Diagnosis not present

## 2017-10-16 NOTE — Progress Notes (Signed)
Zoe Robinson, Zoe Robinson ([id number]) Visit Report for 10/12/2017 Arrival Information Details Patient Name: Zoe Robinson, Zoe Robinson Date of Service: 10/12/2017 1:15 PM Medical Record Number: [id number] Patient Account Number: [id number] Date of Birth/Sex: [birth year] (85 y.o. F) Treating RN: Huel Coventry Primary Care Jayce Kainz: Gabriel Cirri Other Clinician: Referring Philicia Heyne: Gabriel Cirri Treating Requan Hardge/Extender: Kathreen Cosier in Treatment: 5 Visit Information History Since Last Visit Added or deleted any medications: No Patient Arrived: Ambulatory Any new allergies or adverse reactions: No Arrival Time: 13:33 Had a fall or experienced change in No Accompanied By: daughter activities of daily living that may affect Transfer Assistance: Manual risk of falls: Patient Identification Verified: Yes Signs or symptoms of abuse/neglect since last visito No Secondary Verification Process Yes Hospitalized since last visit: No Completed: Has Dressing in Place as Prescribed: Yes Patient Has Alerts: Yes Pain Present Now: No Patient Alerts: Patient on Blood Thinner xarelto Electronic Signature(s) Signed: 10/13/2017 10:13:52 AM By: Dayton Martes RCP, RRT, CHT Entered By: Dayton Martes on 10/12/2017 13:34:32 Zoe Robinson ([id number]) -------------------------------------------------------------------------------- Encounter Discharge Information Details Patient Name: Zoe Robinson Date of Service: 10/12/2017 1:15 PM Medical Record Number: [id number] Patient Account Number: [id number] Date of Birth/Sex: [birth year] (85 y.o. F) Treating RN: Curtis Sites Primary Care Itzia Cunliffe: Gabriel Cirri Other Clinician: Referring Jessabelle Markiewicz: Gabriel Cirri Treating Alonie Gazzola/Extender: Kathreen Cosier in Treatment: 5 Encounter Discharge Information Items Discharge Pain Level: 0 Discharge Condition: Stable Ambulatory Status: Ambulatory Discharge Destination:  Home Transportation: Private Auto Accompanied By: dtr Schedule Follow-up Appointment: Yes Medication Reconciliation completed and No provided to Patient/Care Yafet Cline: Provided on Clinical Summary of Care: 10/12/2017 Form Type Recipient Paper Patient JK Electronic Signature(s) Signed: 10/13/2017 11:20:39 AM By: Gwenlyn Perking Entered By: Gwenlyn Perking on 10/12/2017 14:09:20 Zoe Robinson ([id number]) -------------------------------------------------------------------------------- Lower Extremity Assessment Details Patient Name: Zoe Robinson Date of Service: 10/12/2017 1:15 PM Medical Record Number: [id number] Patient Account Number: [id number] Date of Birth/Sex: 01-13-1932 (85 y.o. F) Treating RN: Curtis Sites Primary Care Stephone Gum: Gabriel Cirri Other Clinician: Referring [place] Inthavong: Gabriel Cirri Treating Shaquetta Arcos/Extender: Kathreen Cosier in Treatment: 5 Edema Assessment Assessed: [Left: No] [Right: No] [Left: Edema] [Right: :] Calf Left: Right: Point of Measurement: 29 cm From Medial Instep 30 cm cm Ankle Left: Right: Point of Measurement: 10 cm From Medial Instep 17.5 cm cm Vascular Assessment Pulses: Dorsalis Pedis Palpable: [Left:Yes] Posterior Tibial Extremity colors, hair growth, and conditions: Extremity Color: [Left:Mottled] Hair Growth on Extremity: [Left:No] Temperature of Extremity: [Left:Warm] Capillary Refill: [Left:< 3 seconds] Electronic Signature(s) Signed: 10/12/2017 5:43:37 PM By: Curtis Sites Entered By: Curtis Sites on 10/12/2017 13:48:43 Zoe Robinson ([id number]) -------------------------------------------------------------------------------- Multi Wound Chart Details Patient Name: Zoe Robinson Date of Service: 10/12/2017 1:15 PM Medical Record Number: [id number] Patient Account Number: [id number] Date of Birth/Sex: [birth year] (85 y.o. F) Treating RN: Curtis Sites Primary Care Towanna Avery: Gabriel Cirri Other  Clinician: Referring Madalene Mickler: Gabriel Cirri Treating Dawanna Grauberger/Extender: Kathreen Cosier in Treatment: 5 Vital Signs Height(in): 59 Pulse(bpm): 74 Weight(lbs): 117 Blood Pressure(mmHg): 147/62 Body Mass Index(BMI): 24 Temperature(F): 98.2 Respiratory Rate 16 (breaths/min): Photos: [1:No Photos] [N/A:N/A] Wound Location: [1:Left Lower Leg - Medial] [N/A:N/A] Wounding Event: [1:Trauma] [N/A:N/A] Primary Etiology: [1:Venous Leg Ulcer] [N/A:N/A] Comorbid History: [1:Cataracts, Anemia, Arrhythmia, Hypertension, Rheumatoid Arthritis, Osteoarthritis] [N/A:N/A] Date Acquired: [1:05/02/2017] [N/A:N/A] Weeks of Treatment: [1:5] [N/A:N/A] Wound Status: [1:Open] [N/A:N/A] Measurements L x W x D [1:3.1x2x0.3] [N/A:N/A] (cm) Area (cm) : [1:4.869] [N/A:N/A] Volume (cm) : [1:1.461] [N/A:N/A] % Reduction in Area: [1:35.60%] [N/A:N/A] % Reduction in Volume: [1:-93.30%] [N/A:N/A] Classification: [1:Full Thickness Without Exposed Support Structures] [N/A:N/A]  Exudate Amount: [1:Large] [N/A:N/A] Exudate Type: [1:Serosanguineous] [N/A:N/A] Exudate Color: [1:red, brown] [N/A:N/A] Wound Margin: [1:Flat and Intact] [N/A:N/A] Granulation Amount: [1:Small (1-33%)] [N/A:N/A] Granulation Quality: [1:Pink] [N/A:N/A] Necrotic Amount: [1:Large (67-100%)] [N/A:N/A] Necrotic Tissue: [1:Eschar, Adherent Slough] [N/A:N/A] Exposed Structures: [1:Fat Layer (Subcutaneous Tissue) Exposed: Yes Fascia: No Tendon: No Muscle: No Joint: No Bone: No] [N/A:N/A] Epithelialization: [1:Small (1-33%)] [N/A:N/A] Debridement: [1:Debridement ([id number]) 13:52] [N/A:N/A N/A] Pre-procedure Verification/Time Out Taken: Pain Control: Lidocaine 4% Topical Solution N/A N/A Tissue Debrided: Fibrin/Slough, Other, N/A N/A Subcutaneous Level: Skin/Subcutaneous Tissue N/A N/A Debridement Area (sq cm): 6.2 N/A N/A Instrument: Curette N/A N/A Bleeding: Minimum N/A N/A Hemostasis Achieved: Pressure N/A N/A Procedural  Pain: 0 N/A N/A Post Procedural Pain: 0 N/A N/A Debridement Treatment Procedure was tolerated well N/A N/A Response: Post Debridement 3.1x2x0.4 N/A N/A Measurements L x W x D (cm) Post Debridement Volume: 1.948 N/A N/A (cm) Periwound Skin Texture: Excoriation: Yes N/A N/A Induration: No Callus: No Crepitus: No Rash: No Scarring: No Periwound Skin Moisture: Maceration: No N/A N/A Dry/Scaly: No Periwound Skin Color: Atrophie Blanche: No N/A N/A Cyanosis: No Ecchymosis: No Erythema: No Hemosiderin Staining: No Mottled: No Pallor: No Rubor: No Temperature: No Abnormality N/A N/A Tenderness on Palpation: Yes N/A N/A Wound Preparation: Ulcer Cleansing: Other: soap N/A N/A and water Topical Anesthetic Applied: Other: lidocaine 4% Procedures Performed: Debridement N/A N/A Treatment Notes Wound #1 (Left, Medial Lower Leg) 1. Cleansed with: Clean wound with Normal Saline Cleanse wound with antibacterial soap and water 2. Anesthetic Topical Lidocaine 4% cream to wound bed prior to debridement 4. Dressing Applied: Santyl Ointment 5. Secondary Dressing Applied Bordered Foam Dressing Dry Gauze Zoe Robinson, Zoe Robinson ([id number]) 7. Secured with Patient to wear own compression stockings Electronic Signature(s) Signed: 10/12/2017 2:02:57 PM By: Bonnell Public Entered By: Bonnell Public on 10/12/2017 14:02:57 Zoe Robinson ([id number]) -------------------------------------------------------------------------------- Multi-Disciplinary Care Plan Details Patient Name: Zoe Robinson Date of Service: 10/12/2017 1:15 PM Medical Record Number: [id number] Patient Account Number: [id number] Date of Birth/Sex: 03/09/1932 (85 y.o. F) Treating RN: Curtis Sites Primary Care Abrey Bradway: Gabriel Cirri Other Clinician: Referring Devany Aja: Gabriel Cirri Treating Savva Beamer/Extender: Kathreen Cosier in Treatment: 5 Active Inactive ` Abuse / Safety / Falls / Self Care  Management Nursing Diagnoses: Potential for falls Goals: Patient will not experience any injury related to falls Date Initiated: 09/07/2017 Target Resolution Date: 12/03/2017 Goal Status: Active Interventions: Assess fall risk on admission and as needed Notes: ` Orientation to the Wound Care Program Nursing Diagnoses: Knowledge deficit related to the wound healing center program Goals: Patient/caregiver will verbalize understanding of the Wound Healing Center Program Date Initiated: 09/07/2017 Target Resolution Date: 12/03/2017 Goal Status: Active Interventions: Provide education on orientation to the wound center Notes: ` Wound/Skin Impairment Nursing Diagnoses: Impaired tissue integrity Goals: Ulcer/skin breakdown will heal within 14 weeks Date Initiated: 09/07/2017 Target Resolution Date: 12/03/2017 Goal Status: Active Interventions: Zoe Robinson, Zoe Robinson ([id number]) Assess patient/caregiver ability to obtain necessary supplies Assess patient/caregiver ability to perform ulcer/skin care regimen upon admission and as needed Assess ulceration(s) every visit Notes: Electronic Signature(s) Signed: 10/12/2017 5:43:37 PM By: Curtis Sites Entered By: Curtis Sites on 10/12/2017 13:51:57 Zoe Robinson ([id number]) -------------------------------------------------------------------------------- Pain Assessment Details Patient Name: Zoe Robinson Date of Service: 10/12/2017 1:15 PM Medical Record Number: [id number] Patient Account Number: [id number] Date of Birth/Sex: Jan 17, 1932 (85 y.o. F) Treating RN: Huel Coventry Primary Care Kaine Mcquillen: Gabriel Cirri Other Clinician: Referring Willadeen Colantuono: Gabriel Cirri Treating Nalini Alcaraz/Extender: Kathreen Cosier in Treatment: 5 Active Problems Location of Pain Severity and Description of Pain Patient  Has Paino Yes Site Locations Rate the pain. Current Pain Level: 3 Pain Management and Medication Current Pain  Management: Electronic Signature(s) Signed: 10/13/2017 10:13:52 AM By: Dayton Martes RCP, RRT, CHT Signed: 10/14/2017 4:43:23 PM By: Elliot Gurney, BSN, RN, CWS, Kim RN, BSN Entered By: Dayton Martes on 10/12/2017 13:34:51 Zoe Robinson ([id number]) -------------------------------------------------------------------------------- Patient/Caregiver Education Details Patient Name: Zoe Robinson Date of Service: 10/12/2017 1:15 PM Medical Record Number: [id number] Patient Account Number: [id number] Date of Birth/Gender: 05-31-1932 (85 y.o. F) Treating RN: Curtis Sites Primary Care Physician: Gabriel Cirri Other Clinician: Referring Physician: Gabriel Cirri Treating Physician/Extender: Kathreen Cosier in Treatment: 5 Education Assessment Education Provided To: Patient and Caregiver Education Topics Provided Wound/Skin Impairment: Handouts: Other: wound care as ordered Methods: Demonstration, Explain/Verbal Responses: State content correctly Electronic Signature(s) Signed: 10/12/2017 5:43:37 PM By: Curtis Sites Entered By: Curtis Sites on 10/12/2017 14:02:23 Zoe Robinson ([id number]) -------------------------------------------------------------------------------- Wound Assessment Details Patient Name: Zoe Robinson Date of Service: 10/12/2017 1:15 PM Medical Record Number: [id number] Patient Account Number: [id number] Date of Birth/Sex: 22-Mar-1932 (85 y.o. F) Treating RN: Curtis Sites Primary Care Bryanne Riquelme: Gabriel Cirri Other Clinician: Referring Iyauna Sing: Gabriel Cirri Treating Rhydian Baldi/Extender: Kathreen Cosier in Treatment: 5 Wound Status Wound Number: 1 Primary Venous Leg Ulcer Etiology: Wound Location: Left Lower Leg - Medial Wound Open Wounding Event: Trauma Status: Date Acquired: 05/02/2017 Comorbid Cataracts, Anemia, Arrhythmia, Hypertension, Weeks Of Treatment: 5 History: Rheumatoid Arthritis,  Osteoarthritis Clustered Wound: No Photos Photo Uploaded By: Curtis Sites on 10/12/2017 16:20:33 Wound Measurements Length: (cm) 3.1 Width: (cm) 2 Depth: (cm) 0.3 Area: (cm) 4.869 Volume: (cm) 1.461 % Reduction in Area: 35.6% % Reduction in Volume: -93.3% Epithelialization: Small (1-33%) Tunneling: No Undermining: No Wound Description Full Thickness Without Exposed Support Classification: Structures Wound Margin: Flat and Intact Exudate Large Amount: Exudate Type: Serosanguineous Exudate Color: red, brown Foul Odor After Cleansing: No Slough/Fibrino Yes Wound Bed Granulation Amount: Small (1-33%) Exposed Structure Granulation Quality: Pink Fascia Exposed: No Necrotic Amount: Large (67-100%) Fat Layer (Subcutaneous Tissue) Exposed: Yes Necrotic Quality: Eschar, Adherent Slough Tendon Exposed: No Muscle Exposed: No Joint Exposed: No Bone Exposed: No Zoe Robinson, Zoe Robinson ([id number]) Periwound Skin Texture Texture Color No Abnormalities Noted: No No Abnormalities Noted: No Callus: No Atrophie Blanche: No Crepitus: No Cyanosis: No Excoriation: Yes Ecchymosis: No Induration: No Erythema: No Rash: No Hemosiderin Staining: No Scarring: No Mottled: No Pallor: No Moisture Rubor: No No Abnormalities Noted: No Dry / Scaly: No Temperature / Pain Maceration: No Temperature: No Abnormality Tenderness on Palpation: Yes Wound Preparation Ulcer Cleansing: Other: soap and water, Topical Anesthetic Applied: Other: lidocaine 4%, Treatment Notes Wound #1 (Left, Medial Lower Leg) 1. Cleansed with: Clean wound with Normal Saline Cleanse wound with antibacterial soap and water 2. Anesthetic Topical Lidocaine 4% cream to wound bed prior to debridement 4. Dressing Applied: Santyl Ointment 5. Secondary Dressing Applied Bordered Foam Dressing Dry Gauze 7. Secured with Patient to wear own compression stockings Electronic Signature(s) Signed: 10/12/2017 5:43:37  PM By: Curtis Sites Entered By: Curtis Sites on 10/12/2017 13:47:30 Zoe Robinson ([id number]) -------------------------------------------------------------------------------- Vitals Details Patient Name: Zoe Robinson Date of Service: 10/12/2017 1:15 PM Medical Record Number: [id number] Patient Account Number: [id number] Date of Birth/Sex: 04/18/1932 (85 y.o. F) Treating RN: Huel Coventry Primary Care [place] Name: Gabriel Cirri Other Clinician: Referring Juliana Boling: Gabriel Cirri Treating Theadore Blunck/Extender: Kathreen Cosier in Treatment: 5 Vital Signs Time Taken: 13:35 Temperature (F): 98.2 Height (in): 59 Pulse (bpm): 74 Weight (lbs): 117 Respiratory Rate (breaths/min): 16 Body Mass Index (BMI): 23.6 Blood  Pressure (mmHg): 147/62 Reference Range: 80 - 120 mg / dl Electronic Signature(s) Signed: 10/13/2017 10:13:52 AM By: Dayton Martes RCP, RRT, CHT Entered By: Dayton Martes on 10/12/2017 13:36:52

## 2017-10-16 NOTE — Progress Notes (Signed)
CARALYNN, GELBER ([id number]) Visit Report for 10/12/2017 Chief Complaint Document Details Patient Name: Zoe Robinson, LOEB Date of Service: 10/12/2017 1:15 PM Medical Record Number: [id number] Patient Account Number: [id number] Date of Birth/Sex: Nov 16, 1931 (85 y.o. F) Treating RN: Huel Coventry Primary Care Provider: Gabriel Cirri Other Clinician: Referring Provider: Gabriel Cirri Treating Provider/Extender: Kathreen Cosier in Treatment: 5 Information Obtained from: Patient Chief Complaint patient is here for review of a nonhealing wound on the left lower extremity Electronic Signature(s) Signed: 10/12/2017 2:04:30 PM By: Bonnell Public Entered By: Bonnell Public on 10/12/2017 14:04:30 Zoe Robinson ([id number]) -------------------------------------------------------------------------------- Debridement Details Patient Name: Zoe Robinson Date of Service: 10/12/2017 1:15 PM Medical Record Number: [id number] Patient Account Number: [id number] Date of Birth/Sex: 07/22/1932 (85 y.o. F) Treating RN: Huel Coventry Primary Care Provider: Gabriel Cirri Other Clinician: Referring Provider: Gabriel Cirri Treating Provider/Extender: Kathreen Cosier in Treatment: 5 Debridement Performed for Wound #1 Left,Medial Lower Leg Assessment: Performed By: Physician Bonnell Public, NP Debridement Type: Debridement Severity of Tissue Pre Fat layer exposed Debridement: Pre-procedure Verification/Time Yes - 13:52 Out Taken: Start Time: 13:52 Pain Control: Lidocaine 4% Topical Solution Level: Skin/Subcutaneous Tissue Total Area Debrided (L x W): 3.1 (cm) x 2 (cm) = 6.2 (cm) Tissue and other material Non-Viable, Fibrin/Slough, Subcutaneous debrided: Instrument: Curette Bleeding: Minimum Hemostasis Achieved: Pressure End Time: 13:57 Procedural Pain: 0 Post Procedural Pain: 0 Response to Treatment: Procedure was tolerated well Post Debridement Measurements of Total  Wound Length: (cm) 3.1 Width: (cm) 2 Depth: (cm) 0.4 Volume: (cm) 1.948 Character of Wound/Ulcer Post Debridement: Improved Severity of Tissue Post Debridement: Fat layer exposed Post Procedure Diagnosis Same as Pre-procedure Electronic Signature(s) Signed: 10/12/2017 2:03:23 PM By: Bonnell Public Signed: 10/14/2017 4:43:23 PM By: Elliot Gurney, BSN, RN, CWS, Kim RN, BSN Entered By: Bonnell Public on 10/12/2017 14:03:23 Zoe Robinson ([id number]) -------------------------------------------------------------------------------- HPI Details Patient Name: Zoe Robinson Date of Service: 10/12/2017 1:15 PM Medical Record Number: [id number] Patient Account Number: [id number] Date of Birth/Sex: 1932-01-25 (85 y.o. F) Treating RN: Huel Coventry Primary Care Provider: Gabriel Cirri Other Clinician: Referring Provider: Gabriel Cirri Treating Provider/Extender: Kathreen Cosier in Treatment: 5 History of Present Illness HPI Description: 09/07/17; this is an 84 year old woman who arrives accompanied by her daughter. She has a history of severe rheumatoid arthritis followed by Dr. Corliss Skains in [place]. By review of care everywhere it appears that she had cellulitis of her left lower leg in September 2018. This may have been caused by a dog scratch. She was followed and her primary care office at [facility] affiliated group. She received antibiotics, Silvadene cream as well as Una boots. This was followed through late October and the wound was healed out on 10/31. The patient and her daughters state that the current wound started with a scissor injury to the medial left calf before the wound was actually healed out in other words this is a different wound area. The original wound was more medial on the left anterior tibial area. Since then they've been using Silvadene cream and apparently she changed doctors who recommended Polysporin and they've been referred here for the injury on her left medial  calf. She does not have a history of rheumatoid-related skin issues/vasculitis The patient is a type II diabetic with most recent hemoglobin A1c of 6. She has advanced seropositive rheumatoid arthritis on prednisone 10 mg alternating with 5 and methotrexate. I see she is also on xarelto while Im not really sure why. She has significant osteoporosis, history of hypercalcemia. ABI in this clinic is 1.08. She  was a former smoker 09/14/17; patient's wound slightly smaller today however still covered in a very thick adherent necrotic surface. We have been using Santyl under compression. We finally got home health to start yesterday "advanced" 09/21/17; not too much change in the area here. We have been using Santyl under compression and I'll continue this after debridement today. Consider Iodoflex if we don't get a better looking wound surface 09/22/17-she presents today as an urgent follow-up secondary to uncontrolled bleeding overnight. She has saturated through the 3-layer compression. Upon dressing removal there is significant clot formation, she continues to bleed from the most proximal aspect of the ulcer. 4 silver nitrate sticks were used to try to obtain hemostasis, along with 10 minutes of pressure dressing. The wound continues to bleed although not as briskly; this appears to be more of a venous bleed and arterial. She is hemodynamically stable upon presentation to the clinic SBP 130's, HR 80's with no complaints of dizziness, lightheadedness. We are unable to obtain any hemostatic agents from the emergency department. Since she has bled through the pressure dressing and continues to bleed despite all efforts available in the clinic she has been sent to the emergency department for evaluation and treatment 09/28/17; the patient has not had an easy time since the last time I saw her. She continues to have nonviable surface over the wound perhaps slightly better using Santyl. She has on xarelto which  no doubt contributes to the bleeding after debridement. I'm going to change her to Iodoflex today. She is having a lot of discomfort with this we gave her tramadol which is reasonable 10/05/17;patient continues to have nonviable surface over a very difficult wound.we also note that her propensity to bleed. We have been using Iodoflex. Abdomen minimal response previously to Santyl. She tells me that her rheumatologist is made adjustments to both her prednisone and methotrexate in attempt to help heal this difficult area. One would wonder if this is all venous insufficiency and whether the rheumatoid arthritis itself could be contributing to an inflammatory ulcer/rheumatoid vasculitis 10/12/17 She is here in follow up evaluation for a left lower extremity ulcer. There is no improvement. We will transition to santyl. Electronic Signature(s) Signed: 10/12/2017 2:32:54 PM By: Bonnell Public Entered By: Bonnell Public on 10/12/2017 14:32:54 Zoe Robinson ([id number]) -------------------------------------------------------------------------------- Physician Orders Details Patient Name: Zoe Robinson Date of Service: 10/12/2017 1:15 PM Medical Record Number: [id number] Patient Account Number: [id number] Date of Birth/Sex: 1932-06-15 (85 y.o. F) Treating RN: Curtis Sites Primary Care Provider: Gabriel Cirri Other Clinician: Referring Provider: Gabriel Cirri Treating Provider/Extender: Kathreen Cosier in Treatment: 5 Verbal / Phone Orders: No Diagnosis Coding Wound Cleansing Wound #1 Left,Medial Lower Leg o Cleanse wound with mild soap and water Anesthetic (add to Medication List) Wound #1 Left,Medial Lower Leg o Topical Lidocaine 4% cream applied to wound bed prior to debridement (In Clinic Only). Skin Barriers/Peri-Wound Care Wound #1 Left,Medial Lower Leg o Barrier cream o Moisturizing lotion Primary Wound Dressing Wound #1 Left,Medial Lower Leg o Santyl  Ointment Secondary Dressing Wound #1 Left,Medial Lower Leg o Dry Gauze o Boardered Foam Dressing Dressing Change Frequency Wound #1 Left,Medial Lower Leg o Change dressing every day. - Patient's daughter will change bandage on days HHRN does not visit Follow-up Appointments Wound #1 Left,Medial Lower Leg o Return Appointment in 1 week. Edema Control Wound #1 Left,Medial Lower Leg o Patient to wear own compression stockings o Elevate legs to the level of the heart and pump ankles as often as  possible Home Health Wound #1 Left,Medial Lower Leg o Continue Home Health Visits o Home Health Nurse may visit PRN to address patientos wound care needs. o FACE TO FACE ENCOUNTER: MEDICARE and MEDICAID PATIENTS: I certify that this patient is under my care and that I had a face-to-face encounter that meets the physician face-to-face encounter requirements with this KAYZLEE, WIRTANEN ([id number]) patient on this date. The encounter with the patient was in whole or in part for the following MEDICAL CONDITION: (primary reason for Home Healthcare) MEDICAL NECESSITY: I certify, that based on my findings, NURSING services are a medically necessary home health service. HOME BOUND STATUS: I certify that my clinical findings support that this patient is homebound (i.e., Due to illness or injury, pt requires aid of supportive devices such as crutches, cane, wheelchairs, walkers, the use of special transportation or the assistance of another person to leave their place of residence. There is a normal inability to leave the home and doing so requires considerable and taxing effort. Other absences are for medical reasons / religious services and are infrequent or of short duration when for other reasons). o If current dressing causes regression in wound condition, may D/C ordered dressing product/s and apply Normal Saline Moist Dressing daily until next Wound Healing Center / Other MD  appointment. Notify Wound Healing Center of regression in wound condition at [phone]. o Please direct any NON-WOUND related issues/requests for orders to patient's Primary Care Physician Electronic Signature(s) Signed: 10/12/2017 5:31:19 PM By: Bonnell Public Signed: 10/12/2017 5:43:37 PM By: Curtis Sites Entered By: Curtis Sites on 10/12/2017 13:58:03 Zoe Robinson ([id number]) -------------------------------------------------------------------------------- Problem List Details Patient Name: Zoe Robinson Date of Service: 10/12/2017 1:15 PM Medical Record Number: [id number] Patient Account Number: [id number] Date of Birth/Sex: [birth year] (85 y.o. 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 09/07/2017 Yes I87.312 Chronic venous hypertension (idiopathic) with ulcer of left 09/07/2017 Yes lower extremity M05.80 Other rheumatoid arthritis with rheumatoid factor of 09/07/2017 Yes unspecified site Inactive Problems Resolved Problems Electronic Signature(s) Signed: 10/12/2017 2:02:42 PM By: Bonnell Public Entered By: Bonnell Public on 10/12/2017 14:02:42 Zoe Robinson ([id number]) -------------------------------------------------------------------------------- Progress Note Details Patient Name: Zoe Robinson Date of Service: 10/12/2017 1:15 PM Medical Record Number: [id number] Patient Account Number: [id number] Date of Birth/Sex: August 18, 1931 (85 y.o. F) Treating RN: Huel Coventry Primary Care Provider: Gabriel Cirri Other Clinician: Referring Provider: Gabriel Cirri Treating Provider/Extender: Kathreen Cosier in Treatment: 5 Subjective Chief  Complaint Information obtained from Patient patient is here for review of a nonhealing wound on the left lower extremity History of Present Illness (HPI) 09/07/17; this is an 84 year old woman who arrives accompanied by her daughter. She has a history of severe rheumatoid arthritis followed by Dr. Corliss Skains in [place]. By review of care everywhere it appears that she had cellulitis of her left lower leg in September 2018. This may have been caused by a dog scratch. She was followed and her primary care office at [facility] affiliated group. She received antibiotics, Silvadene cream as well as Una boots. This was followed through late October and the wound was healed out on 10/31. The patient and her daughters state that the current wound started with a scissor injury to the medial left calf before the wound was actually healed out in other words this  is a different wound area. The original wound was more medial on the left anterior tibial area. Since then they've been using Silvadene cream and apparently she changed doctors who recommended Polysporin and they've been referred here for the injury on her left medial calf. She does not have a history of rheumatoid-related skin issues/vasculitis The patient is a type II diabetic with most recent hemoglobin A1c of 6. She has advanced seropositive rheumatoid arthritis on prednisone 10 mg alternating with 5 and methotrexate. I see she is also on xarelto while Im not really sure why. She has significant osteoporosis, history of hypercalcemia. ABI in this clinic is 1.08. She was a former smoker 09/14/17; patient's wound slightly smaller today however still covered in a very thick adherent necrotic surface. We have been using Santyl under compression. We finally got home health to start yesterday "advanced" 09/21/17; not too much change in the area here. We have been using Santyl under compression and I'll continue this after debridement today. Consider Iodoflex  if we don't get a better looking wound surface 09/22/17-she presents today as an urgent follow-up secondary to uncontrolled bleeding overnight. She has saturated through the 3-layer compression. Upon dressing removal there is significant clot formation, she continues to bleed from the most proximal aspect of the ulcer. 4 silver nitrate sticks were used to try to obtain hemostasis, along with 10 minutes of pressure dressing. The wound continues to bleed although not as briskly; this appears to be more of a venous bleed and arterial. She is hemodynamically stable upon presentation to the clinic SBP 130's, HR 80's with no complaints of dizziness, lightheadedness. We are unable to obtain any hemostatic agents from the emergency department. Since she has bled through the pressure dressing and continues to bleed despite all efforts available in the clinic she has been sent to the emergency department for evaluation and treatment 09/28/17; the patient has not had an easy time since the last time I saw her. She continues to have nonviable surface over the wound perhaps slightly better using Santyl. She has on xarelto which no doubt contributes to the bleeding after debridement. I'm going to change her to Iodoflex today. She is having a lot of discomfort with this we gave her tramadol which is reasonable 10/05/17;patient continues to have nonviable surface over a very difficult wound.we also note that her propensity to bleed. We have been using Iodoflex. Abdomen minimal response previously to Santyl. She tells me that her rheumatologist is made adjustments to both her prednisone and methotrexate in attempt to help heal this difficult area. One would wonder if this is all venous insufficiency and whether the rheumatoid arthritis itself could be contributing to an inflammatory ulcer/rheumatoid vasculitis 10/12/17 She is here in follow up evaluation for a left lower extremity ulcer. There is no improvement. We will  transition to santyl. Patient History Information obtained from Patient. MARICELA, ALLING ([id number]) Family History Cancer - Siblings, Heart Disease - Father,Mother,Siblings, Hypertension - Mother,Father,Siblings, No family history of Diabetes, Hereditary Spherocytosis, Kidney Disease, Lung Disease, Seizures, Stroke, Thyroid Problems, Tuberculosis. Social History Former smoker, Marital Status - Married, Alcohol Use - Never, Drug Use - No History, Caffeine Use - Daily. Medical And Surgical History Notes Cardiovascular heart murmur Objective Constitutional Vitals Time Taken: 1:35 PM, Height: 59 in, Weight: 117 lbs, BMI: 23.6, Temperature: 98.2 F, Pulse: 74 bpm, Respiratory Rate: 16 breaths/min, Blood Pressure: 147/62 mmHg. Integumentary (Hair, Skin) Wound #1 status is Open. Original cause of wound was Trauma. The wound is located  on the Left,Medial Lower Leg. The wound measures 3.1cm length x 2cm width x 0.3cm depth; 4.869cm^2 area and 1.461cm^3 volume. There is Fat Layer (Subcutaneous Tissue) Exposed exposed. There is no tunneling or undermining noted. There is a large amount of serosanguineous drainage noted. The wound margin is flat and intact. There is small (1-33%) pink granulation within the wound bed. There is a large (67-100%) amount of necrotic tissue within the wound bed including Eschar and Adherent Slough. The periwound skin appearance exhibited: Excoriation. The periwound skin appearance did not exhibit: Callus, Crepitus, Induration, Rash, Scarring, Dry/Scaly, Maceration, Atrophie Blanche, Cyanosis, Ecchymosis, Hemosiderin Staining, Mottled, Pallor, Rubor, Erythema. Periwound temperature was noted as No Abnormality. The periwound has tenderness on palpation. Assessment Active Problems ICD-10 L97.223 - 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 muscle I87.312 - Chronic venous hypertension (idiopathic) with ulcer of left lower extremity M05.80 - Other  rheumatoid arthritis with rheumatoid factor of unspecified site CORINA, STACY ([id number]) Procedures Wound #1 Pre-procedure diagnosis of Wound #1 is a Venous Leg Ulcer located on the Left,Medial Lower Leg .Severity of Tissue Pre Debridement is: Fat layer exposed. There was a Skin/Subcutaneous Tissue Debridement ([id number]) debridement with total area of 6.2 sq cm performed by Bonnell Public, NP. with the following instrument(s): Curette to remove Non-Viable tissue/material including Fibrin/Slough and Subcutaneous after achieving pain control using Lidocaine 4% Topical Solution. A time out was conducted at 13:52, prior to the start of the procedure. A Minimum amount of bleeding was controlled with Pressure. The procedure was tolerated well with a pain level of 0 throughout and a pain level of 0 following the procedure. Post Debridement Measurements: 3.1cm length x 2cm width x 0.4cm depth; 1.948cm^3 volume. Character of Wound/Ulcer Post Debridement is improved. Severity of Tissue Post Debridement is: Fat layer exposed. Post procedure Diagnosis Wound #1: Same as Pre-Procedure Plan Wound Cleansing: Wound #1 Left,Medial Lower Leg: Cleanse wound with mild soap and water Anesthetic (add to Medication List): Wound #1 Left,Medial Lower Leg: Topical Lidocaine 4% cream applied to wound bed prior to debridement (In Clinic Only). Skin Barriers/Peri-Wound Care: Wound #1 Left,Medial Lower Leg: Barrier cream Moisturizing lotion Primary Wound Dressing: Wound #1 Left,Medial Lower Leg: Santyl Ointment Secondary Dressing: Wound #1 Left,Medial Lower Leg: Dry Gauze Boardered Foam Dressing Dressing Change Frequency: Wound #1 Left,Medial Lower Leg: Change dressing every day. - Patient's daughter will change bandage on days HHRN does not visit Follow-up Appointments: Wound #1 Left,Medial Lower Leg: Return Appointment in 1 week. Edema Control: Wound #1 Left,Medial Lower Leg: Patient to wear own  compression stockings Elevate legs to the level of the heart and pump ankles as often as possible Home Health: Wound #1 Left,Medial Lower Leg: Continue Home Health Visits Home Health Nurse may visit PRN to address patient s wound care needs. FACE TO FACE ENCOUNTER: MEDICARE and MEDICAID PATIENTS: I certify that this patient is under my care and that I had a face-to-face encounter that meets the physician face-to-face encounter requirements with this patient on this date. The encounter with the patient was in whole or in part for the following MEDICAL CONDITION: (primary reason for Home Healthcare) MEDICAL NECESSITY: I certify, that based on my findings, NURSING services are a medically necessary home health service. HOME BOUND STATUS: I certify that my clinical findings support that this patient is homebound (i.e., Due to illness or injury, pt requires aid of supportive devices such as crutches, cane, wheelchairs, walkers, the use of special Charo, Daphene Jaeger ([id number]) transportation or the assistance of  another person to leave their place of residence. There is a normal inability to leave the home and doing so requires considerable and taxing effort. Other absences are for medical reasons / religious services and are infrequent or of short duration when for other reasons). If current dressing causes regression in wound condition, may D/C ordered dressing product/s and apply Normal Saline Moist Dressing daily until next Wound Healing Center / Other MD appointment. Notify Wound Healing Center of regression in wound condition at [phone]. Please direct any NON-WOUND related issues/requests for orders to patient's Primary Care Physician 1. sanyl daily 2. compression stockings 3. follow up next week Electronic Signature(s) Signed: 10/12/2017 2:33:34 PM By: Bonnell Public Entered By: Bonnell Public on 10/12/2017 14:33:33 Zoe Robinson  ([id number]) -------------------------------------------------------------------------------- ROS/PFSH Details Patient Name: Zoe Robinson Date of Service: 10/12/2017 1:15 PM Medical Record Number: [id number] Patient Account Number: [id number] Date of Birth/Sex: 05-28-1932 (85 y.o. F) Treating RN: Huel Coventry Primary Care Provider: Gabriel Cirri Other Clinician: Referring Provider: Gabriel Cirri Treating Provider/Extender: Kathreen Cosier in Treatment: 5 Information Obtained From Patient Wound History Do you currently have one or more open woundso Yes How many open wounds do you currently haveo 1 Approximately how long have you had your woundso 5 months How have you been treating your wound(s) until nowo silvadene Has your wound(s) ever healed and then re-openedo No Have you had any lab work done in the past montho No Have you tested positive for an antibiotic resistant organism (MRSA, VRE)o No Have you tested positive for osteomyelitis (bone infection)o No Have you had any tests for circulation on your legso No Have you had other problems associated with your woundso Swelling Eyes Medical History: Positive for: Cataracts - removed Negative for: Glaucoma; Optic Neuritis Hematologic/Lymphatic Medical History: Positive for: Anemia Negative for: Hemophilia; Human Immunodeficiency Virus; Lymphedema; Sickle Cell Disease Respiratory Medical History: Negative for: Aspiration; Asthma; Chronic Obstructive Pulmonary Disease (COPD); Pneumothorax; Sleep Apnea; Tuberculosis Cardiovascular Medical History: Positive for: Arrhythmia - a fib; Hypertension Negative for: Angina; Congestive Heart Failure; Coronary Artery Disease; Deep Vein Thrombosis; Hypotension; Myocardial Infarction; Peripheral Arterial Disease; Peripheral Venous Disease; Phlebitis; Vasculitis Past Medical History Notes: heart murmur Gastrointestinal Medical History: Negative for: Cirrhosis ; Colitis; Crohnos;  Hepatitis A; Hepatitis B; Hepatitis C Endocrine MAILIN, COGLIANESE ([id number]) Medical History: Negative for: Type I Diabetes; Type II Diabetes Immunological Medical History: Negative for: Lupus Erythematosus; Raynaudos; Scleroderma Musculoskeletal Medical History: Positive for: Rheumatoid Arthritis; Osteoarthritis Negative for: Gout; Osteomyelitis Neurologic Medical History: Negative for: Dementia; Neuropathy; Seizure Disorder Oncologic Medical History: Negative for: Received Chemotherapy; Received Radiation HBO Extended History Items Eyes: Cataracts Immunizations Pneumococcal Vaccine: Received Pneumococcal Vaccination: Yes Immunization Notes: up to date Implantable Devices Family and Social History Cancer: Yes - Siblings; Diabetes: No; Heart Disease: Yes - Father,Mother,Siblings; Hereditary Spherocytosis: No; Hypertension: Yes - Mother,Father,Siblings; Kidney Disease: No; Lung Disease: No; Seizures: No; Stroke: No; Thyroid Problems: No; Tuberculosis: No; Former smoker; Marital Status - Married; Alcohol Use: Never; Drug Use: No History; Caffeine Use: Daily; Financial Concerns: No; Food, Clothing or Shelter Needs: No; Support System Lacking: No; Transportation Concerns: No; Advanced Directives: No; Patient does not want information on Advanced Directives Physician Affirmation I have reviewed and agree with the above information. Electronic Signature(s) Signed: 10/12/2017 5:31:19 PM By: Bonnell Public Signed: 10/14/2017 4:43:23 PM By: Elliot Gurney, BSN, RN, CWS, Kim RN, BSN Entered By: Bonnell Public on 10/12/2017 14:33:05 Zoe Robinson ([id number]) -------------------------------------------------------------------------------- SuperBill Details Patient Name: Zoe Robinson Date of Service: 10/12/2017 Medical Record Number: [id number] Patient Account  Number: [id number] Date of Birth/Sex: 09-01-1931 (84 y.o. 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

## 2017-10-18 ENCOUNTER — Other Ambulatory Visit: Payer: Self-pay | Admitting: *Deleted

## 2017-10-18 MED ORDER — METHOTREXATE 2.5 MG PO TABS: 12.5000 mg | ORAL_TABLET | ORAL | 0 refills | Status: DC

## 2017-10-18 NOTE — Telephone Encounter (Signed)
Refill request received via fax  Last Visit: 10/04/17 Next Visit: 01/04/18 Labs: 08/01/17 Stable  Okay to refill per Dr. Corliss Skains

## 2017-10-19 ENCOUNTER — Encounter: Payer: Medicare Other | Admitting: Internal Medicine

## 2017-10-19 DIAGNOSIS — E11622 Type 2 diabetes mellitus with other skin ulcer: Secondary | ICD-10-CM | POA: Diagnosis not present

## 2017-10-21 NOTE — Progress Notes (Signed)
Zoe Robinson, Zoe Robinson ([id number]) Visit Report for 10/19/2017 Debridement Details Patient Name: Zoe Robinson, Zoe Robinson Date of Service: 10/19/2017 1:15 PM Medical Record Number: [id number] Patient Account Number: [id number] Date of Birth/Sex: 1931-09-08 (85 y.o. F) Treating RN: Huel Coventry Primary Care Provider: Gabriel Cirri Other Clinician: Referring Provider: Gabriel Cirri Treating Provider/Extender: Altamese [place] in Treatment: 6 Debridement Performed for Wound #1 Left,Medial Lower Leg Assessment: Performed By: Physician Maxwell Caul, MD Debridement Type: Debridement Severity of Tissue Pre Fat layer exposed Debridement: Pre-procedure Verification/Time Yes - 13:30 Out Taken: Start Time: 13:30 Pain Control: Other : lidocaine 4% Total Area Debrided (L x W): 2.6 (cm) x 1.6 (cm) = 4.16 (cm) Tissue and other material Viable, Non-Viable, Slough, Subcutaneous, Fibrin/Exudate, Slough debrided: Level: Skin/Subcutaneous Tissue Debridement Description: Excisional Instrument: Curette Bleeding: Moderate Hemostasis Achieved: Pressure End Time: 13:32 Procedural Pain: 3 Post Procedural Pain: 2 Response to Treatment: Procedure was tolerated well Post Debridement Measurements of Total Wound Length: (cm) 2.6 Width: (cm) 1.6 Depth: (cm) 0.4 Volume: (cm) 1.307 Character of Wound/Ulcer Post Debridement: Requires Further Debridement Severity of Tissue Post Debridement: Fat layer exposed Post Procedure Diagnosis Same as Pre-procedure Electronic Signature(s) Signed: 10/19/2017 5:06:18 PM By: Elliot Gurney, BSN, RN, CWS, Kim RN, BSN Signed: 10/20/2017 8:15:41 AM By: Baltazar Najjar MD Entered By: Baltazar Najjar on 10/19/2017 13:51:35 Zoe Robinson ([id number]) -------------------------------------------------------------------------------- HPI Details Patient Name: Zoe Robinson Date of Service: 10/19/2017 1:15 PM Medical Record Number: [id number] Patient Account Number:  [id number] Date of Birth/Sex: May 13, 1932 (85 y.o. F) Treating RN: Huel Coventry Primary Care Provider: Gabriel Cirri Other Clinician: Referring Provider: Gabriel Cirri Treating Provider/Extender: Altamese [place] in Treatment: 6 History of Present Illness HPI Description: 09/07/17; this is an 84 year old woman who arrives accompanied by her daughter. She has a history of severe rheumatoid arthritis followed by Dr. Corliss Skains in [place]. By review of care everywhere it appears that she had cellulitis of her left lower leg in September 2018. This may have been caused by a dog scratch. She was followed and her primary care office at [facility] affiliated group. She received antibiotics, Silvadene cream as well as Una boots. This was followed through late October and the wound was healed out on 10/31. The patient and her daughters state that the current wound started with a scissor injury to the medial left calf before the wound was actually healed out in other words this is a different wound area. The original wound was more medial on the left anterior tibial area. Since then they've been using Silvadene cream and apparently she changed doctors who recommended Polysporin and they've been referred here for the injury on her left medial calf. She does not have a history of rheumatoid-related skin issues/vasculitis The patient is a type II diabetic with most recent hemoglobin A1c of 6. She has advanced seropositive rheumatoid arthritis on prednisone 10 mg alternating with 5 and methotrexate. I see she is also on xarelto while Im not really sure why. She has significant osteoporosis, history of hypercalcemia. ABI in this clinic is 1.08. She was a former smoker 09/14/17; patient's wound slightly smaller today however still covered in a very thick adherent necrotic surface. We have been using Santyl under compression. We finally got home health to start yesterday "advanced" 09/21/17; not too much change  in the area here. We have been using Santyl under compression and I'll continue this after debridement today. Consider Iodoflex if we don't get a better looking wound surface 09/22/17-she presents today as an urgent follow-up secondary to uncontrolled  bleeding overnight. She has saturated through the 3-layer compression. Upon dressing removal there is significant clot formation, she continues to bleed from the most proximal aspect of the ulcer. 4 silver nitrate sticks were used to try to obtain hemostasis, along with 10 minutes of pressure dressing. The wound continues to bleed although not as briskly; this appears to be more of a venous bleed and arterial. She is hemodynamically stable upon presentation to the clinic SBP 130's, HR 80's with no complaints of dizziness, lightheadedness. We are unable to obtain any hemostatic agents from the emergency department. Since she has bled through the pressure dressing and continues to bleed despite all efforts available in the clinic she has been sent to the emergency department for evaluation and treatment 09/28/17; the patient has not had an easy time since the last time I saw her. She continues to have nonviable surface over the wound perhaps slightly better using Santyl. She has on xarelto which no doubt contributes to the bleeding after debridement. I'm going to change her to Iodoflex today. She is having a lot of discomfort with this we gave her tramadol which is reasonable 10/05/17;patient continues to have nonviable surface over a very difficult wound.we also note that her propensity to bleed. We have been using Iodoflex. previously minimal response previously to [employer]. She tells me that her rheumatologist is made adjustments to both her prednisone and methotrexate in attempt to help heal this difficult area. One would wonder if this is all venous insufficiency and whether the rheumatoid arthritis itself could be contributing to an inflammatory  ulcer/rheumatoid vasculitis 10/12/17 She is here in follow up evaluation for a left lower extremity ulcer. There is no improvement. We will transition to santyl. 10/19/17; she has transitioned back to sample and has been changing this every day. There has been some improvement although still requiring mechanical debridement Electronic Signature(s) Signed: 10/20/2017 8:15:41 AM By: Baltazar Najjar MD Entered By: Baltazar Najjar on 10/19/2017 13:52:47 Zoe Robinson ([id number]) -------------------------------------------------------------------------------- Physical Exam Details Patient Name: Zoe Robinson Date of Service: 10/19/2017 1:15 PM Medical Record Number: [id number] Patient Account Number: [id number] Date of Birth/Sex: 11/11/1931 (85 y.o. F) Treating RN: Huel Coventry Primary Care Provider: Gabriel Cirri Other Clinician: Referring Provider: Gabriel Cirri Treating Provider/Extender: Maxwell Caul We[place] in Treatment: 6 Constitutional Sitting or standing Blood Pressure is within target range for patient.. Pulse regular and within target range for patient.Marland Kitchen Respirations regular, non-labored and within target range.. Temperature is normal and within the target range for the patient.Marland Kitchen appears in no distress. Notes exam; still nonviable surface using a #5 curet to remove necrotic subcutaneous tissue and debris. The surface looks better today. She had slight bleeding which required direct pressure. She tolerates this fairly well. I'm hopeful that we have turned a corner here and that we appear to have a granulated tissue bed. Electronic Signature(s) Signed: 10/20/2017 8:15:41 AM By: Baltazar Najjar MD Entered By: Baltazar Najjar on 10/19/2017 13:53:57 Zoe Robinson ([id number]) -------------------------------------------------------------------------------- Physician Orders Details Patient Name: Zoe Robinson Date of Service: 10/19/2017 1:15 PM Medical Record Number:  [id number] Patient Account Number: [id number] Date of Birth/Sex: Jun 10, 1932 (85 y.o. F) Treating RN: Huel Coventry Primary Care Provider: Gabriel Cirri Other Clinician: Referring Provider: Gabriel Cirri Treating Provider/Extender: Altamese [place] in Treatment: 6 Verbal / Phone Orders: No Diagnosis Coding Wound Cleansing Wound #1 Left,Medial Lower Leg o Cleanse wound with mild soap and water Anesthetic (add to Medication List) Wound #1 Left,Medial Lower Leg o Topical Lidocaine 4% cream applied to  wound bed prior to debridement (In Clinic Only). Skin Barriers/Peri-Wound Care Wound #1 Left,Medial Lower Leg o Barrier cream o Moisturizing lotion Primary Wound Dressing Wound #1 Left,Medial Lower Leg o Santyl Ointment Secondary Dressing Wound #1 Left,Medial Lower Leg o Dry Gauze o Boardered Foam Dressing Dressing Change Frequency Wound #1 Left,Medial Lower Leg o Change dressing every day. - Patient's daughter will change bandage on days HHRN does not visit Follow-up Appointments Wound #1 Left,Medial Lower Leg o Return Appointment in 1 week. Edema Control Wound #1 Left,Medial Lower Leg o Patient to wear own compression stockings o Elevate legs to the level of the heart and pump ankles as often as possible Home Health Wound #1 Left,Medial Lower Leg o Continue Home Health Visits o Home Health Nurse may visit PRN to address patientos wound care needs. o FACE TO FACE ENCOUNTER: MEDICARE and MEDICAID PATIENTS: I certify that this patient is under my care and that I had a face-to-face encounter that meets the physician face-to-face encounter requirements with this Zoe Robinson, Zoe Robinson ([id number]) patient on this date. The encounter with the patient was in whole or in part for the following MEDICAL CONDITION: (primary reason for Home Healthcare) MEDICAL NECESSITY: I certify, that based on my findings, NURSING services are a medically necessary home  health service. HOME BOUND STATUS: I certify that my clinical findings support that this patient is homebound (i.e., Due to illness or injury, pt requires aid of supportive devices such as crutches, cane, wheelchairs, walkers, the use of special transportation or the assistance of another person to leave their place of residence. There is a normal inability to leave the home and doing so requires considerable and taxing effort. Other absences are for medical reasons / religious services and are infrequent or of short duration when for other reasons). o If current dressing causes regression in wound condition, may D/C ordered dressing product/s and apply Normal Saline Moist Dressing daily until next Wound Healing Center / Other MD appointment. Notify Wound Healing Center of regression in wound condition at [phone]. o Please direct any NON-WOUND related issues/requests for orders to patient's Primary Care Physician Notes [place] Authorization Electronic Signature(s) Signed: 10/19/2017 5:06:18 PM By: Elliot Gurney, BSN, RN, CWS, Kim RN, BSN Signed: 10/20/2017 8:15:41 AM By: Baltazar Najjar MD Entered By: Elliot Gurney, BSN, RN, CWS, Kim on 10/19/2017 13:34:45 Zoe Robinson ([id number]) -------------------------------------------------------------------------------- Problem List Details Patient Name: Zoe Robinson Date of Service: 10/19/2017 1:15 PM Medical Record Number: [id number] Patient Account Number: [id number] Date of Birth/Sex: [birth year] (85 y.o. 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 09/07/2017 Yes I87.312 Chronic venous hypertension (idiopathic) with ulcer of left  09/07/2017 Yes lower extremity M05.80 Other rheumatoid arthritis with rheumatoid factor of 09/07/2017 Yes unspecified site Inactive Problems Resolved Problems Electronic Signature(s) Signed: 10/20/2017 8:15:41 AM By: Baltazar Najjar MD Entered By: Baltazar Najjar on 10/19/2017 13:51:08 Zoe Robinson ([id number]) -------------------------------------------------------------------------------- Progress Note Details Patient Name: Zoe Robinson Date of Service: 10/19/2017 1:15 PM Medical Record Number: [id number] Patient Account Number: [id number] Date of Birth/Sex: 1932/04/13 (85 y.o. F) Treating RN: Huel Coventry Primary Care Provider: Gabriel Cirri Other Clinician: Referring Provider: Gabriel Cirri Treating Provider/Extender: Maxwell Caul [place] in Treatment: 6 Subjective History of Present Illness (HPI) 09/07/17; this is an 84 year old woman who arrives accompanied by her daughter.  She has a history of severe rheumatoid arthritis followed by Dr. Corliss Skains in [place]. By review of care everywhere it appears that she had cellulitis of her left lower leg in September 2018. This may have been caused by a dog scratch. She was followed and her primary care office at [facility] affiliated group. She received antibiotics, Silvadene cream as well as Una boots. This was followed through late October and the wound was healed out on 10/31. The patient and her daughters state that the current wound started with a scissor injury to the medial left calf before the wound was actually healed out in other words this is a different wound area. The original wound was more medial on the left anterior tibial area. Since then they've been using Silvadene cream and apparently she changed doctors who recommended Polysporin and they've been referred here for the injury on her left medial calf. She does not have a history of rheumatoid-related skin issues/vasculitis The patient is a type II diabetic with most  recent hemoglobin A1c of 6. She has advanced seropositive rheumatoid arthritis on prednisone 10 mg alternating with 5 and methotrexate. I see she is also on xarelto while Im not really sure why. She has significant osteoporosis, history of hypercalcemia. ABI in this clinic is 1.08. She was a former smoker 09/14/17; patient's wound slightly smaller today however still covered in a very thick adherent necrotic surface. We have been using Santyl under compression. We finally got home health to start yesterday "advanced" 09/21/17; not too much change in the area here. We have been using Santyl under compression and I'll continue this after debridement today. Consider Iodoflex if we don't get a better looking wound surface 09/22/17-she presents today as an urgent follow-up secondary to uncontrolled bleeding overnight. She has saturated through the 3-layer compression. Upon dressing removal there is significant clot formation, she continues to bleed from the most proximal aspect of the ulcer. 4 silver nitrate sticks were used to try to obtain hemostasis, along with 10 minutes of pressure dressing. The wound continues to bleed although not as briskly; this appears to be more of a venous bleed and arterial. She is hemodynamically stable upon presentation to the clinic SBP 130's, HR 80's with no complaints of dizziness, lightheadedness. We are unable to obtain any hemostatic agents from the emergency department. Since she has bled through the pressure dressing and continues to bleed despite all efforts available in the clinic she has been sent to the emergency department for evaluation and treatment 09/28/17; the patient has not had an easy time since the last time I saw her. She continues to have nonviable surface over the wound perhaps slightly better using Santyl. She has on xarelto which no doubt contributes to the bleeding after debridement. I'm going to change her to Iodoflex today. She is having a lot of  discomfort with this we gave her tramadol which is reasonable 10/05/17;patient continues to have nonviable surface over a very difficult wound.we also note that her propensity to bleed. We have been using Iodoflex. previously minimal response previously to [employer]. She tells me that her rheumatologist is made adjustments to both her prednisone and methotrexate in attempt to help heal this difficult area. One would wonder if this is all venous insufficiency and whether the rheumatoid arthritis itself could be contributing to an inflammatory ulcer/rheumatoid vasculitis 10/12/17 She is here in follow up evaluation for a left lower extremity ulcer. There is no improvement. We will transition to santyl. 10/19/17; she has transitioned  back to sample and has been changing this every day. There has been some improvement although still requiring mechanical debridement Zoe Robinson, Zoe Robinson ([id number]) Objective Constitutional Sitting or standing Blood Pressure is within target range for patient.. Pulse regular and within target range for patient.Marland Kitchen Respirations regular, non-labored and within target range.. Temperature is normal and within the target range for the patient.Marland Kitchen appears in no distress. Vitals Time Taken: 1:09 PM, Height: 59 in, Weight: 117 lbs, BMI: 23.6, Temperature: 97.7 F, Pulse: 81 bpm, Respiratory Rate: 16 breaths/min, Blood Pressure: 141/55 mmHg. General Notes: exam; still nonviable surface using a #5 curet to remove necrotic subcutaneous tissue and debris. The surface looks better today. She had slight bleeding which required direct pressure. She tolerates this fairly well. I'm hopeful that we have turned a corner here and that we appear to have a granulated tissue bed. Integumentary (Hair, Skin) Wound #1 status is Open. Original cause of wound was Trauma. The wound is located on the Left,Medial Lower Leg. The wound measures 2.6cm length x 1.6cm width x 0.3cm depth; 3.267cm^2 area and  0.98cm^3 volume. There is Fat Layer (Subcutaneous Tissue) Exposed exposed. There is no tunneling or undermining noted. There is a large amount of serous drainage noted. The wound margin is flat and intact. There is small (1-33%) pink granulation within the wound bed. There is a large (67-100%) amount of necrotic tissue within the wound bed including Adherent Slough. The periwound skin appearance exhibited: Scarring. The periwound skin appearance did not exhibit: Callus, Crepitus, Excoriation, Induration, Rash, Dry/Scaly, Maceration, Atrophie Blanche, Cyanosis, Ecchymosis, Hemosiderin Staining, Mottled, Pallor, Rubor, Erythema. Periwound temperature was noted as No Abnormality. The periwound has tenderness on palpation. Assessment Active Problems ICD-10 L97.223 - 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 muscle I87.312 - Chronic venous hypertension (idiopathic) with ulcer of left lower extremity M05.80 - Other rheumatoid arthritis with rheumatoid factor of unspecified site Procedures Wound #1 Pre-procedure diagnosis of Wound #1 is a Venous Leg Ulcer located on the Left,Medial Lower Leg .Severity of Tissue Pre Debridement is: Fat layer exposed. There was a Excisional Skin/Subcutaneous Tissue Debridement with a total area of 4.16 sq cm performed by Maxwell Caul, MD. With the following instrument(s): Curette. to remove Viable and Non-Viable tissue/material Material removed includes Subcutaneous Tissue, and Slough, Fibrin/Exudate, and [place] after achieving pain control using Other (lidocaine 4%). No specimens were taken. A time out was conducted at 13:30, prior to the start of the procedure. A Moderate amount of bleeding was controlled with Pressure. The procedure was tolerated well with a pain level of 3 throughout and a pain level of 2 following the procedure. Post Debridement Measurements: 2.6cm length x 1.6cm width x 0.4cm depth; 1.307cm^3 volume. Character of Wound/Ulcer  Post Debridement requires further debridement. Severity of Tissue Post Debridement is: Fat layer Zoe Robinson, Zoe Robinson ([id number]) exposed. Post procedure Diagnosis Wound #1: Same as Pre-Procedure Plan Wound Cleansing: Wound #1 Left,Medial Lower Leg: Cleanse wound with mild soap and water Anesthetic (add to Medication List): Wound #1 Left,Medial Lower Leg: Topical Lidocaine 4% cream applied to wound bed prior to debridement (In Clinic Only). Skin Barriers/Peri-Wound Care: Wound #1 Left,Medial Lower Leg: Barrier cream Moisturizing lotion Primary Wound Dressing: Wound #1 Left,Medial Lower Leg: Santyl Ointment Secondary Dressing: Wound #1 Left,Medial Lower Leg: Dry Gauze Boardered Foam Dressing Dressing Change Frequency: Wound #1 Left,Medial Lower Leg: Change dressing every day. - Patient's daughter will change bandage on days HHRN does not visit Follow-up Appointments: Wound #1 Left,Medial Lower Leg: Return Appointment  in 1 week. Edema Control: Wound #1 Left,Medial Lower Leg: Patient to wear own compression stockings Elevate legs to the level of the heart and pump ankles as often as possible Home Health: Wound #1 Left,Medial Lower Leg: Continue Home Health Visits Home Health Nurse may visit PRN to address patient s wound care needs. FACE TO FACE ENCOUNTER: MEDICARE and MEDICAID PATIENTS: I certify that this patient is under my care and that I had a face-to-face encounter that meets the physician face-to-face encounter requirements with this patient on this date. The encounter with the patient was in whole or in part for the following MEDICAL CONDITION: (primary reason for Home Healthcare) MEDICAL NECESSITY: I certify, that based on my findings, NURSING services are a medically necessary home health service. HOME BOUND STATUS: I certify that my clinical findings support that this patient is homebound (i.e., Due to illness or injury, pt requires aid of supportive devices such as  crutches, cane, wheelchairs, walkers, the use of special transportation or the assistance of another person to leave their place of residence. There is a normal inability to leave the home and doing so requires considerable and taxing effort. Other absences are for medical reasons / religious services and are infrequent or of short duration when for other reasons). If current dressing causes regression in wound condition, may D/C ordered dressing product/s and apply Normal Saline Moist Dressing daily until next Wound Healing Center / Other MD appointment. Notify Wound Healing Center of regression in wound condition at [phone]. Please direct any NON-WOUND related issues/requests for orders to patient's Primary Care Physician General Notes: [place] Authorization Zoe Robinson, Zoe Robinson ([id number]) #1 continue with the Santyl/border foam #2 we will run [employer]) Signed: 10/20/2017 8:15:41 AM By: Baltazar Najjar MD Entered By: Baltazar Najjar on 10/19/2017 13:55:29 Zoe Robinson ([id number]) -------------------------------------------------------------------------------- SuperBill Details Patient Name: Zoe Robinson Date of Service: 10/19/2017 Medical Record Number: [id number] Patient Account Number: [id number] Date of Birth/Sex: Dec 15, 1931 (85 y.o. F) Treating RN: Huel Coventry Primary Care Provider: Gabriel Cirri Other Clinician: Referring Provider: Gabriel Cirri Treating Provider/Extender: Altamese [place] in Treatment: 6 Diagnosis Coding ICD-10 Codes Code Description [phone] 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 muscle I87.312 Chronic venous hypertension (idiopathic) with ulcer of left lower extremity M05.80 Other rheumatoid arthritis with rheumatoid factor of unspecified site Facility Procedures CPT4 Code: [id number] Description: 11042 - DEB SUBQ TISSUE 20 SQ CM/< ICD-10 Diagnosis Description L97.223  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 mu Modifier: scle Quantity: 1 Physician Procedures CPT4 Code: [id number] Description: 11042 - WC PHYS SUBQ TISS 20 SQ CM ICD-10 Diagnosis Description L97.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

## 2017-10-21 NOTE — Progress Notes (Signed)
PORSCHEA, BATTE ([id number]) Visit Report for 10/19/2017 Arrival Information Details Patient Name: Zoe Robinson Date of Service: 10/19/2017 1:15 PM Medical Record Number: [id number] Patient Account Number: [id number] Date of Birth/Sex: April 25, 1932 (85 y.o. F) Treating RN: Phillis Haggis Primary Care Deem Marmol: Gabriel Cirri Other Clinician: Referring Alverna Fawley: Gabriel Cirri Treating Jaylen Knope/Extender: Altamese [place] in Treatment: 6 Visit Information History Since Last Visit All ordered tests and consults were completed: No Patient Arrived: Ambulatory Added or deleted any medications: No Arrival Time: 13:08 Any new allergies or adverse reactions: No Accompanied By: daughter Had a fall or experienced change in No Transfer Assistance: None activities of daily living that may affect Patient Identification Verified: Yes risk of falls: Secondary Verification Process Yes Signs or symptoms of abuse/neglect since last visito No Completed: Hospitalized since last visit: No Patient Requires Transmission-Based No Implantable device outside of the clinic excluding No Precautions: cellular tissue based products placed in the center Patient Has Alerts: Yes since last visit: Patient Alerts: Patient on Blood Has Dressing in Place as Prescribed: Yes Thinner Has Compression in Place as Prescribed: Yes xarelto Pain Present Now: Yes Electronic Signature(s) Signed: 10/19/2017 4:37:47 PM By: Alejandro Mulling Entered By: Alejandro Mulling on 10/19/2017 13:09:10 Zoe Robinson ([id number]) -------------------------------------------------------------------------------- Encounter Discharge Information Details Patient Name: Zoe Robinson Date of Service: 10/19/2017 1:15 PM Medical Record Number: [id number] Patient Account Number: [id number] Date of Birth/Sex: [birth year] (85 y.o. F) Treating RN: Huel Coventry Primary Care Brentyn Seehafer: Gabriel Cirri Other Clinician: Referring  Malkie Wille: Gabriel Cirri Treating Shannara Winbush/Extender: Altamese [place] in Treatment: 6 Encounter Discharge Information Items Discharge Pain Level: 0 Discharge Condition: Stable Ambulatory Status: Ambulatory Discharge Destination: Home Transportation: Private Auto Accompanied By: dtr Schedule Follow-up Appointment: Yes Medication Reconciliation completed and No provided to Patient/Care Ashwin Tibbs: Provided on Clinical Summary of Care: 10/19/2017 Form Type Recipient Paper Patient JK Electronic Signature(s) Signed: 10/19/2017 1:55:01 PM By: Curtis Sites Entered By: Curtis Sites on 10/19/2017 13:55:01 Zoe Robinson ([id number]) -------------------------------------------------------------------------------- Lower Extremity Assessment Details Patient Name: Zoe Robinson Date of Service: 10/19/2017 1:15 PM Medical Record Number: [id number] Patient Account Number: [id number] Date of Birth/Sex: [birth year] (85 y.o. F) Treating RN: Phillis Haggis Primary Care Denita Lun: Gabriel Cirri Other Clinician: Referring Zyan Mirkin: Gabriel Cirri Treating Almas Rake/Extender: Altamese [place] in Treatment: 6 Edema Assessment Assessed: [Left: No] [Right: No] [Left: Edema] [Right: :] Calf Left: Right: Point of Measurement: 29 cm From Medial Instep 31.1 cm cm Ankle Left: Right: Point of Measurement: 10 cm From Medial Instep 18.6 cm cm Vascular Assessment Pulses: Dorsalis Pedis Palpable: [Left:Yes] Posterior Tibial Extremity colors, hair growth, and conditions: Extremity Color: [Left:Mottled] Temperature of Extremity: [Left:Warm] Capillary Refill: [Left:< 3 seconds] Toe Nail Assessment Left: Right: Thick: Yes Discolored: Yes Deformed: Yes Improper Length and Hygiene: Yes Electronic Signature(s) Signed: 10/19/2017 4:37:47 PM By: Alejandro Mulling Entered By: Alejandro Mulling on 10/19/2017 13:18:07 Zoe Robinson  ([id number]) -------------------------------------------------------------------------------- Multi Wound Chart Details Patient Name: Zoe Robinson Date of Service: 10/19/2017 1:15 PM Medical Record Number: [id number] Patient Account Number: [id number] Date of Birth/Sex: 08-16-1931 (85 y.o. F) Treating RN: Huel Coventry Primary Care Athelene Hursey: Gabriel Cirri Other Clinician: Referring Brad Lieurance: Gabriel Cirri Treating Osmara Drummonds/Extender: Altamese [place] in Treatment: 6 Vital Signs Height(in): 59 Pulse(bpm): 81 Weight(lbs): 117 Blood Pressure(mmHg): 141/55 Body Mass Index(BMI): 24 Temperature(F): 97.7 Respiratory Rate 16 (breaths/min): Photos: [1:No Photos] [N/A:N/A] Wound Location: [1:Left Lower Leg - Medial] [N/A:N/A] Wounding Event: [1:Trauma] [N/A:N/A] Primary Etiology: [1:Venous Leg Ulcer] [N/A:N/A] Comorbid History: [1:Cataracts, Anemia, Arrhythmia, Hypertension, Rheumatoid Arthritis, Osteoarthritis] [N/A:N/A] Date Acquired: [1:05/02/2017] [  N/A:N/A] Weeks of Treatment: [1:6] [N/A:N/A] Wound Status: [1:Open] [N/A:N/A] Measurements L x W x D [1:2.6x1.6x0.3] [N/A:N/A] (cm) Area (cm) : [1:3.267] [N/A:N/A] Volume (cm) : [1:0.98] [N/A:N/A] % Reduction in Area: [1:56.80%] [N/A:N/A] % Reduction in Volume: [1:-29.60%] [N/A:N/A] Classification: [1:Full Thickness Without Exposed Support Structures] [N/A:N/A] Exudate Amount: [1:Large] [N/A:N/A] Exudate Type: [1:Serous] [N/A:N/A] Exudate Color: [1:amber] [N/A:N/A] Wound Margin: [1:Flat and Intact] [N/A:N/A] Granulation Amount: [1:Small (1-33%)] [N/A:N/A] Granulation Quality: [1:Pink] [N/A:N/A] Necrotic Amount: [1:Large (67-100%)] [N/A:N/A] Exposed Structures: [1:Fat Layer (Subcutaneous Tissue) Exposed: Yes Fascia: No Tendon: No Muscle: No Joint: No Bone: No] [N/A:N/A] Epithelialization: [1:Small (1-33%)] [N/A:N/A] Debridement: [1:Debridement - Excisional] [N/A:N/A] Pre-procedure [1:13:30]  [N/A:N/A] Verification/Time Out Taken: Zoe Robinson ([id number]) Pain Control: Other N/A N/A Tissue Debrided: Subcutaneous, Slough N/A N/A Level: Skin/Subcutaneous Tissue N/A N/A Debridement Area (sq cm): 4.16 N/A N/A Instrument: Curette N/A N/A Bleeding: Moderate N/A N/A Hemostasis Achieved: Pressure N/A N/A Procedural Pain: 3 N/A N/A Post Procedural Pain: 2 N/A N/A Debridement Treatment Procedure was tolerated well N/A N/A Response: Post Debridement 2.6x1.6x0.4 N/A N/A Measurements L x W x D (cm) Post Debridement Volume: 1.307 N/A N/A (cm) Periwound Skin Texture: Scarring: Yes N/A N/A Excoriation: No Induration: No Callus: No Crepitus: No Rash: No Periwound Skin Moisture: Maceration: No N/A N/A Dry/Scaly: No Periwound Skin Color: Atrophie Blanche: No N/A N/A Cyanosis: No Ecchymosis: No Erythema: No Hemosiderin Staining: No Mottled: No Pallor: No Rubor: No Temperature: No Abnormality N/A N/A Tenderness on Palpation: Yes N/A N/A Wound Preparation: Ulcer Cleansing: N/A N/A Rinsed/Irrigated with Saline Topical Anesthetic Applied: Other: lidocaine 4% Procedures Performed: Debridement N/A N/A Treatment Notes Electronic Signature(s) Signed: 10/20/2017 8:15:41 AM By: Baltazar Najjar MD Entered By: Baltazar Najjar on 10/19/2017 13:51:21 Zoe Robinson ([id number]) -------------------------------------------------------------------------------- Multi-Disciplinary Care Plan Details Patient Name: Zoe Robinson Date of Service: 10/19/2017 1:15 PM Medical Record Number: [id number] Patient Account Number: [id number] Date of Birth/Sex: Jul 01, 1932 (85 y.o. F) Treating RN: Huel Coventry Primary Care Hayato Guaman: Gabriel Cirri Other Clinician: Referring Isabell Bonafede: Gabriel Cirri Treating Carlyon Nolasco/Extender: Altamese [place] in Treatment: 6 Active Inactive ` Abuse / Safety / Falls / Self Care Management Nursing Diagnoses: Potential for falls Goals: Patient  will not experience any injury related to falls Date Initiated: 09/07/2017 Target Resolution Date: 12/03/2017 Goal Status: Active Interventions: Assess fall risk on admission and as needed Notes: ` Orientation to the Wound Care Program Nursing Diagnoses: Knowledge deficit related to the wound healing center program Goals: Patient/caregiver will verbalize understanding of the Wound Healing Center Program Date Initiated: 09/07/2017 Target Resolution Date: 12/03/2017 Goal Status: Active Interventions: Provide education on orientation to the wound center Notes: ` Wound/Skin Impairment Nursing Diagnoses: Impaired tissue integrity Goals: Ulcer/skin breakdown will heal within 14 weeks Date Initiated: 09/07/2017 Target Resolution Date: 12/03/2017 Goal Status: Active Interventions: CHARLESETTA, MILLIRON ([id number]) Assess patient/caregiver ability to obtain necessary supplies Assess patient/caregiver ability to perform ulcer/skin care regimen upon admission and as needed Assess ulceration(s) every visit Notes: Electronic Signature(s) Signed: 10/19/2017 5:06:18 PM By: Elliot Gurney, BSN, RN, CWS, Kim RN, BSN Entered By: Elliot Gurney, BSN, RN, CWS, Kim on 10/19/2017 13:29:03 Zoe Robinson ([id number]) -------------------------------------------------------------------------------- Pain Assessment Details Patient Name: Zoe Robinson Date of Service: 10/19/2017 1:15 PM Medical Record Number: [id number] Patient Account Number: [id number] Date of Birth/Sex: [birth year] (85 y.o. F) Treating RN: Phillis Haggis Primary Care Myer Bohlman: Gabriel Cirri Other Clinician: Referring Kass Herberger: Gabriel Cirri Treating Johsua Shevlin/Extender: Altamese [place] in Treatment: 6 Active Problems Location of Pain Severity and Description of Pain Patient Has Paino Yes Site Locations Pain Location:  Pain in Ulcers Rate the pain. Current Pain Level: 3 Character of Pain Describe the Pain: Burning Pain Management  and Medication Current Pain Management: Notes Topical or injectable lidocaine is offered to patient for acute pain when surgical debridement is performed. If needed, Patient is instructed to use over the counter pain medication for the following 24-48 hours after debridement. Wound care MDs do not prescribed pain medications. Patient has chronic pain or uncontrolled pain. Patient has been instructed to make an appointment with their Primary Care Physician for pain management. Electronic Signature(s) Signed: 10/19/2017 4:37:47 PM By: Alejandro Mulling Entered By: Alejandro Mulling on 10/19/2017 13:09:36 Zoe Robinson ([id number]) -------------------------------------------------------------------------------- Patient/Caregiver Education Details Patient Name: Zoe Robinson Date of Service: 10/19/2017 1:15 PM Medical Record Number: [id number] Patient Account Number: [id number] Date of Birth/Gender: August 20, 1931 (85 y.o. F) Treating RN: Curtis Sites Primary Care Physician: Gabriel Cirri Other Clinician: Referring Physician: Gabriel Cirri Treating Physician/Extender: Altamese [place] in Treatment: 6 Education Assessment Education Provided To: Patient Education Topics Provided Wound/Skin Impairment: Handouts: Other: wound care as ordered Methods: Explain/Verbal Responses: State content correctly Electronic Signature(s) Signed: 10/19/2017 4:02:56 PM By: Curtis Sites Entered By: Curtis Sites on 10/19/2017 13:55:18 Zoe Robinson ([id number]) -------------------------------------------------------------------------------- Wound Assessment Details Patient Name: Zoe Robinson Date of Service: 10/19/2017 1:15 PM Medical Record Number: [id number] Patient Account Number: [id number] Date of Birth/Sex: 08/16/1931 (85 y.o. F) Treating RN: Phillis Haggis Primary Care Jernie Schutt: Gabriel Cirri Other Clinician: Referring Bentleigh Stankus: Gabriel Cirri Treating  Joshuan Bolander/Extender: Maxwell Caul [place] in Treatment: 6 Wound Status Wound Number: 1 Primary Venous Leg Ulcer Etiology: Wound Location: Left Lower Leg - Medial Wound Open Wounding Event: Trauma Status: Date Acquired: 05/02/2017 Comorbid Cataracts, Anemia, Arrhythmia, Hypertension, Weeks Of Treatment: 6 History: Rheumatoid Arthritis, Osteoarthritis Clustered Wound: No Photos Photo Uploaded By: Alejandro Mulling on 10/19/2017 16:16:25 Wound Measurements Length: (cm) 2.6 Width: (cm) 1.6 Depth: (cm) 0.3 Area: (cm) 3.267 Volume: (cm) 0.98 % Reduction in Area: 56.8% % Reduction in Volume: -29.6% Epithelialization: Small (1-33%) Tunneling: No Undermining: No Wound Description Full Thickness Without Exposed Support Classification: Structures Wound Margin: Flat and Intact Exudate Large Amount: Exudate Type: Serous Exudate Color: amber Foul Odor After Cleansing: No Slough/Fibrino Yes Wound Bed Granulation Amount: Small (1-33%) Exposed Structure Granulation Quality: Pink Fascia Exposed: No Necrotic Amount: Large (67-100%) Fat Layer (Subcutaneous Tissue) Exposed: Yes Necrotic Quality: Adherent Slough Tendon Exposed: No Muscle Exposed: No Joint Exposed: No Bone Exposed: No Kohler, Kassady ([id number]) Periwound Skin Texture Texture Color No Abnormalities Noted: No No Abnormalities Noted: No Callus: No Atrophie Blanche: No Crepitus: No Cyanosis: No Excoriation: No Ecchymosis: No Induration: No Erythema: No Rash: No Hemosiderin Staining: No Scarring: Yes Mottled: No Pallor: No Moisture Rubor: No No Abnormalities Noted: No Dry / Scaly: No Temperature / Pain Maceration: No Temperature: No Abnormality Tenderness on Palpation: Yes Wound Preparation Ulcer Cleansing: Rinsed/Irrigated with Saline Topical Anesthetic Applied: Other: lidocaine 4%, Treatment Notes Wound #1 (Left, Medial Lower Leg) 1. Cleansed with: Clean wound with Normal Saline 2.  Anesthetic Topical Lidocaine 4% cream to wound bed prior to debridement 4. Dressing Applied: Santyl Ointment 5. Secondary Dressing Applied Bordered Foam Dressing Dry Gauze 7. Secured with Patient to wear own compression stockings Electronic Signature(s) Signed: 10/19/2017 4:37:47 PM By: Alejandro Mulling Entered By: Alejandro Mulling on 10/19/2017 13:16:33 Zoe Robinson ([id number]) -------------------------------------------------------------------------------- Vitals Details Patient Name: Zoe Robinson Date of Service: 10/19/2017 1:15 PM Medical Record Number: [id number] Patient Account Number: [id number] Date of Birth/Sex: [birth year] (85 y.o. F) Treating RN: Phillis Haggis Primary Care  Samier Jaco: Gabriel Cirri Other Clinician: Referring Kasean Denherder: Gabriel Cirri Treating Jakeem Grape/Extender: Altamese [place] in Treatment: 6 Vital Signs Time Taken: 13:09 Temperature (F): 97.7 Height (in): 59 Pulse (bpm): 81 Weight (lbs): 117 Respiratory Rate (breaths/min): 16 Body Mass Index (BMI): 23.6 Blood Pressure (mmHg): 141/55 Reference Range: 80 - 120 mg / dl Electronic Signature(s) Signed: 10/19/2017 4:37:47 PM By: Alejandro Mulling Entered By: Alejandro Mulling on 10/19/2017 13:11:30

## 2017-10-26 ENCOUNTER — Encounter: Payer: Medicare Other | Attending: Internal Medicine | Admitting: Internal Medicine

## 2017-10-26 DIAGNOSIS — M810 Age-related osteoporosis without current pathological fracture: Secondary | ICD-10-CM | POA: Diagnosis not present

## 2017-10-26 DIAGNOSIS — E11622 Type 2 diabetes mellitus with other skin ulcer: Secondary | ICD-10-CM | POA: Diagnosis not present

## 2017-10-26 DIAGNOSIS — I499 Cardiac arrhythmia, unspecified: Secondary | ICD-10-CM | POA: Diagnosis not present

## 2017-10-26 DIAGNOSIS — Z7901 Long term (current) use of anticoagulants: Secondary | ICD-10-CM | POA: Insufficient documentation

## 2017-10-26 DIAGNOSIS — E1151 Type 2 diabetes mellitus with diabetic peripheral angiopathy without gangrene: Secondary | ICD-10-CM | POA: Insufficient documentation

## 2017-10-26 DIAGNOSIS — M069 Rheumatoid arthritis, unspecified: Secondary | ICD-10-CM | POA: Insufficient documentation

## 2017-10-26 DIAGNOSIS — L97223 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 muscle: Secondary | ICD-10-CM | POA: Insufficient documentation

## 2017-10-26 DIAGNOSIS — Z87891 Personal history of nicotine dependence: Secondary | ICD-10-CM | POA: Diagnosis not present

## 2017-10-26 DIAGNOSIS — I10 Essential (primary) hypertension: Secondary | ICD-10-CM | POA: Diagnosis not present

## 2017-10-27 NOTE — Progress Notes (Addendum)
Zoe Robinson, STREED ([id number]) Visit Report for 10/26/2017 Arrival Information Details Patient Name: Zoe Robinson, Zoe Robinson Date of Service: 10/26/2017 11:00 AM Medical Record Number: [id number] Patient Account Number: [id number] Date of Birth/Sex: 04-16-1932 (84 y.o. F) Treating RN: Renne Crigler Primary Care Devante Capano: Gabriel Cirri Other Clinician: Referring Delmas Faucett: Gabriel Cirri Treating Sherree Shankman/Extender: Altamese [place] in Treatment: 7 Visit Information History Since Last Visit All ordered tests and consults were completed: No Patient Arrived: Ambulatory Added or deleted any medications: No Arrival Time: 11:13 Any new allergies or adverse reactions: No Accompanied By: daughter Had a fall or experienced change in No Transfer Assistance: None activities of daily living that may affect Patient Identification Verified: Yes risk of falls: Secondary Verification Process Yes Signs or symptoms of abuse/neglect since last visito No Completed: Hospitalized since last visit: No Patient Requires Transmission-Based No Implantable device outside of the clinic excluding No Precautions: cellular tissue based products placed in the center Patient Has Alerts: Yes since last visit: Patient Alerts: Patient on Blood Has Dressing in Place as Prescribed: Yes Thinner Has Compression in Place as Prescribed: Yes xarelto Pain Present Now: Yes Electronic Signature(s) Signed: 10/26/2017 11:26:12 AM By: Renne Crigler Entered By: Renne Crigler on 10/26/2017 11:14:12 Zoe Robinson ([id number]) -------------------------------------------------------------------------------- Encounter Discharge Information Details Patient Name: Zoe Robinson Date of Service: 10/26/2017 11:00 AM Medical Record Number: [id number] Patient Account Number: [id number] Date of Birth/Sex: March 09, 1932 (84 y.o. F) Treating RN: Huel Coventry Primary Care Cher Franzoni: Gabriel Cirri Other Clinician: Referring  Madailein Londo: Gabriel Cirri Treating Amanada Philbrick/Extender: Altamese [place] in Treatment: 7 Encounter Discharge Information Items Discharge Pain Level: 0 Discharge Condition: Stable Ambulatory Status: Ambulatory Discharge Destination: Home Transportation: Private Auto Accompanied By: dtr Schedule Follow-up Appointment: Yes Medication Reconciliation completed and No provided to Patient/Care Chassidy Layson: Provided on Clinical Summary of Care: 10/26/2017 Form Type Recipient Paper Patient JK Electronic Signature(s) Signed: 10/26/2017 4:21:37 PM By: Curtis Sites Entered By: Curtis Sites on 10/26/2017 12:16:40 Zoe Robinson ([id number]) -------------------------------------------------------------------------------- Lower Extremity Assessment Details Patient Name: Zoe Robinson Date of Service: 10/26/2017 11:00 AM Medical Record Number: [id number] Patient Account Number: [id number] Date of Birth/Sex: 09/03/1931 (84 y.o. F) Treating RN: Renne Crigler Primary Care Tajh Livsey: Gabriel Cirri Other Clinician: Referring Jefte Carithers: Gabriel Cirri Treating Jakaylee Sasaki/Extender: Altamese [place] in Treatment: 7 Edema Assessment Assessed: [Left: No] [Right: No] Edema: [Left: N] [Right: o] Calf Left: Right: Point of Measurement: 29 cm From Medial Instep 30.7 cm cm Ankle Left: Right: Point of Measurement: 10 cm From Medial Instep 17.5 cm cm Vascular Assessment Claudication: Claudication Assessment [Left:None] Pulses: Dorsalis Pedis Palpable: [Left:Yes] Posterior Tibial Extremity colors, hair growth, and conditions: Extremity Color: [Left:Normal] Hair Growth on Extremity: [Left:No] Temperature of Extremity: [Left:Warm] Capillary Refill: [Left:< 3 seconds] Toe Nail Assessment Left: Right: Thick: Yes Discolored: Yes Deformed: No Improper Length and Hygiene: Yes Electronic Signature(s) Signed: 10/26/2017 11:26:12 AM By: Renne Crigler Entered By: Renne Crigler on  10/26/2017 11:20:59 Zoe Robinson ([id number]) -------------------------------------------------------------------------------- Multi Wound Chart Details Patient Name: Zoe Robinson Date of Service: 10/26/2017 11:00 AM Medical Record Number: [id number] Patient Account Number: [id number] Date of Birth/Sex: April 05, 1932 (84 y.o. F) Treating RN: Huel Coventry Primary Care Karlena Luebke: Gabriel Cirri Other Clinician: Referring Savior Himebaugh: Gabriel Cirri Treating Emin Foree/Extender: Altamese  in Treatment: 7 Vital Signs Height(in): 59 Pulse(bpm): 78 Weight(lbs): 117 Blood Pressure(mmHg): 134/60 Body Mass Index(BMI): 24 Temperature(F): 97.9 Respiratory Rate 16 (breaths/min): Photos: [1:No Photos] [N/A:N/A] Wound Location: [1:Left Lower Leg - Medial] [N/A:N/A] Wounding Event: [1:Trauma] [N/A:N/A] Primary Etiology: [1:Venous Leg Ulcer] [N/A:N/A] Comorbid History: [1:Cataracts,  Anemia, Arrhythmia, Hypertension, Rheumatoid Arthritis, Osteoarthritis] [N/A:N/A] Date Acquired: [1:05/02/2017] [N/A:N/A] Weeks of Treatment: [1:7] [N/A:N/A] Wound Status: [1:Open] [N/A:N/A] Measurements L x W x D [1:2.2x1.6x0.4] [N/A:N/A] (cm) Area (cm) : [1:2.765] [N/A:N/A] Volume (cm) : [1:1.106] [N/A:N/A] % Reduction in Area: [1:63.40%] [N/A:N/A] % Reduction in Volume: [1:-46.30%] [N/A:N/A] Classification: [1:Full Thickness Without Exposed Support Structures] [N/A:N/A] Exudate Amount: [1:Large] [N/A:N/A] Exudate Type: [1:Serous] [N/A:N/A] Exudate Color: [1:amber] [N/A:N/A] Wound Margin: [1:Flat and Intact] [N/A:N/A] Granulation Amount: [1:Small (1-33%)] [N/A:N/A] Granulation Quality: [1:Pink] [N/A:N/A] Necrotic Amount: [1:Large (67-100%)] [N/A:N/A] Exposed Structures: [1:Fat Layer (Subcutaneous Tissue) Exposed: Yes Fascia: No Tendon: No Muscle: No Joint: No Bone: No] [N/A:N/A] Epithelialization: [1:Small (1-33%)] [N/A:N/A] Periwound Skin Texture: [1:Scarring: Yes Excoriation: No  Induration: No] [N/A:N/A] Callus: No Crepitus: No Rash: No Periwound Skin Moisture: Maceration: No N/A N/A Dry/Scaly: No Periwound Skin Color: Atrophie Blanche: No N/A N/A Cyanosis: No Ecchymosis: No Erythema: No Hemosiderin Staining: No Mottled: No Pallor: No Rubor: No Temperature: No Abnormality N/A N/A Tenderness on Palpation: Yes N/A N/A Wound Preparation: Ulcer Cleansing: N/A N/A Rinsed/Irrigated with Saline Topical Anesthetic Applied: Other: lidocaine 4% Treatment Notes Wound #1 (Left, Medial Lower Leg) 1. Cleansed with: Clean wound with Normal Saline 2. Anesthetic Topical Lidocaine 4% cream to wound bed prior to debridement 4. Dressing Applied: Santyl Ointment 5. Secondary Dressing Applied Bordered Foam Dressing Dry Gauze 7. Secured with Patient to wear own compression stockings Electronic Signature(s) Signed: 10/26/2017 4:49:21 PM By: Baltazar Najjar MD Entered By: Baltazar Najjar on 10/26/2017 12:42:28 Zoe Robinson ([id number]) -------------------------------------------------------------------------------- Multi-Disciplinary Care Plan Details Patient Name: Zoe Robinson Date of Service: 10/26/2017 11:00 AM Medical Record Number: [id number] Patient Account Number: [id number] Date of Birth/Sex: October 30, 1931 (84 y.o. F) Treating RN: Huel Coventry Primary Care Vinetta Brach: Gabriel Cirri Other Clinician: Referring Christle Nolting: Gabriel Cirri Treating Dougles Kimmey/Extender: Altamese [place] in Treatment: 7 Active Inactive ` Abuse / Safety / Falls / Self Care Management Nursing Diagnoses: Potential for falls Goals: Patient will not experience any injury related to falls Date Initiated: 09/07/2017 Target Resolution Date: 12/03/2017 Goal Status: Active Interventions: Assess fall risk on admission and as needed Notes: ` Orientation to the Wound Care Program Nursing Diagnoses: Knowledge deficit related to the wound healing center  program Goals: Patient/caregiver will verbalize understanding of the Wound Healing Center Program Date Initiated: 09/07/2017 Target Resolution Date: 12/03/2017 Goal Status: Active Interventions: Provide education on orientation to the wound center Notes: ` Wound/Skin Impairment Nursing Diagnoses: Impaired tissue integrity Goals: Ulcer/skin breakdown will heal within 14 weeks Date Initiated: 09/07/2017 Target Resolution Date: 12/03/2017 Goal Status: Active Interventions: KNOX, BURWELL ([id number]) Assess patient/caregiver ability to obtain necessary supplies Assess patient/caregiver ability to perform ulcer/skin care regimen upon admission and as needed Assess ulceration(s) every visit Notes: Electronic Signature(s) Signed: 10/31/2017 9:08:37 AM By: Elliot Gurney, BSN, RN, CWS, Kim RN, BSN Entered By: Elliot Gurney, BSN, RN, CWS, Kim on 10/26/2017 12:03:22 Zoe Robinson ([id number]) -------------------------------------------------------------------------------- Pain Assessment Details Patient Name: Zoe Robinson Date of Service: 10/26/2017 11:00 AM Medical Record Number: [id number] Patient Account Number: [id number] Date of Birth/Sex: 21-Jun-1932 (84 y.o. F) Treating RN: Renne Crigler Primary Care Naven Giambalvo: Gabriel Cirri Other Clinician: Referring Jase Reep: Gabriel Cirri Treating Collis Thede/Extender: Altamese [place] in Treatment: 7 Active Problems Location of Pain Severity and Description of Pain Patient Has Paino Yes Site Locations Pain Location: Pain in Ulcers Rate the pain. Current Pain Level: 3 Character of Pain Describe the Pain: Burning Pain Management and Medication Current Pain Management: Notes Topical or injectable lidocaine is offered to patient for acute pain  when surgical debridement is performed. If needed, Patient is instructed to use over the counter pain medication for the following 24-48 hours after debridement. Wound care MDs do not prescribed  pain medications. Patient has chronic pain or uncontrolled pain. Patient has been instructed to make an appointment with their Primary Care Physician for pain management. Electronic Signature(s) Signed: 10/26/2017 11:26:12 AM By: Renne Crigler Entered By: Renne Crigler on 10/26/2017 11:14:45 Zoe Robinson ([id number]) -------------------------------------------------------------------------------- Patient/Caregiver Education Details Patient Name: Zoe Robinson Date of Service: 10/26/2017 11:00 AM Medical Record Number: [id number] Patient Account Number: [id number] Date of Birth/Gender: Jul 18, 1932 (84 y.o. F) Treating RN: Curtis Sites Primary Care Physician: Gabriel Cirri Other Clinician: Referring Physician: Gabriel Cirri Treating Physician/Extender: Altamese [place] in Treatment: 7 Education Assessment Education Provided To: Patient and Caregiver Education Topics Provided Wound/Skin Impairment: Handouts: Other: wound care to continue as ordered Methods: Demonstration, Explain/Verbal Responses: State content correctly Electronic Signature(s) Signed: 10/26/2017 4:21:37 PM By: Curtis Sites Entered By: Curtis Sites on 10/26/2017 12:17:01 Zoe Robinson ([id number]) -------------------------------------------------------------------------------- Wound Assessment Details Patient Name: Zoe Robinson Date of Service: 10/26/2017 11:00 AM Medical Record Number: [id number] Patient Account Number: [id number] Date of Birth/Sex: 12-Dec-1931 (84 y.o. F) Treating RN: Renne Crigler Primary Care Khing Belcher: Gabriel Cirri Other Clinician: Referring Nataliya Graig: Gabriel Cirri Treating Blaise Palladino/Extender: Altamese [place] in Treatment: 7 Wound Status Wound Number: 1 Primary Venous Leg Ulcer Etiology: Wound Location: Left Lower Leg - Medial Wound Open Wounding Event: Trauma Status: Date Acquired: 05/02/2017 Comorbid Cataracts, Anemia, Arrhythmia,  Hypertension, Weeks Of Treatment: 7 History: Rheumatoid Arthritis, Osteoarthritis Clustered Wound: No Wound Measurements Length: (cm) 2.2 Width: (cm) 1.6 Depth: (cm) 0.4 Area: (cm) 2.765 Volume: (cm) 1.106 % Reduction in Area: 63.4% % Reduction in Volume: -46.3% Epithelialization: Small (1-33%) Tunneling: No Undermining: No Wound Description Full Thickness Without Exposed Support Classification: Structures Wound Margin: Flat and Intact Exudate Large Amount: Exudate Type: Serous Exudate Color: amber Foul Odor After Cleansing: No Slough/Fibrino Yes Wound Bed Granulation Amount: Small (1-33%) Exposed Structure Granulation Quality: Pink Fascia Exposed: No Necrotic Amount: Large (67-100%) Fat Layer (Subcutaneous Tissue) Exposed: Yes Necrotic Quality: Adherent Slough Tendon Exposed: No Muscle Exposed: No Joint Exposed: No Bone Exposed: No Periwound Skin Texture Texture Color No Abnormalities Noted: No No Abnormalities Noted: No Callus: No Atrophie Blanche: No Crepitus: No Cyanosis: No Excoriation: No Ecchymosis: No Induration: No Erythema: No Rash: No Hemosiderin Staining: No Scarring: Yes Mottled: No Pallor: No Moisture Rubor: No No Abnormalities Noted: No Dry / Scaly: No Temperature / Pain Rutkowski, Tyffani ([id number]) Maceration: No Temperature: No Abnormality Tenderness on Palpation: Yes Wound Preparation Ulcer Cleansing: Rinsed/Irrigated with Saline Topical Anesthetic Applied: Other: lidocaine 4%, Treatment Notes Wound #1 (Left, Medial Lower Leg) 1. Cleansed with: Clean wound with Normal Saline 2. Anesthetic Topical Lidocaine 4% cream to wound bed prior to debridement 4. Dressing Applied: Santyl Ointment 5. Secondary Dressing Applied Bordered Foam Dressing Dry Gauze 7. Secured with Patient to wear own compression stockings Electronic Signature(s) Signed: 10/26/2017 11:26:12 AM By: Renne Crigler Entered By: Renne Crigler on  10/26/2017 11:19:37 Zoe Robinson ([id number]) -------------------------------------------------------------------------------- Vitals Details Patient Name: Zoe Robinson Date of Service: 10/26/2017 11:00 AM Medical Record Number: [id number] Patient Account Number: [id number] Date of Birth/Sex: [birth year] (84 y.o. F) Treating RN: Renne Crigler Primary Care Hakiem Malizia: Gabriel Cirri Other Clinician: Referring Prakriti Carignan: Gabriel Cirri Treating Vicki Chaffin/Extender: Altamese [place] in Treatment: 7 Vital Signs Time Taken: 11:14 Temperature (F): 97.9 Height (in): 59 Pulse (bpm): 78 Weight (lbs): 117 Respiratory Rate (breaths/min): 16 Body  Mass Index (BMI): 23.6 Blood Pressure (mmHg): 134/60 Reference Range: 80 - 120 mg / dl Electronic Signature(s) Signed: 10/26/2017 11:26:12 AM By: Renne Crigler Entered By: Renne Crigler on 10/26/2017 11:15:34

## 2017-10-31 NOTE — Progress Notes (Signed)
PAKOU, TURSI ([id number]) Visit Report for 10/26/2017 Debridement Details Patient Name: Zoe Robinson, Zoe Robinson Date of Service: 10/26/2017 11:00 AM Medical Record Number: [id number] Patient Account Number: [id number] Date of Birth/Sex: 1931-10-26 (85 y.o. F) Treating RN: Huel Coventry Primary Care Provider: Gabriel Cirri Other Clinician: Referring Provider: Gabriel Cirri Treating Provider/Extender: Altamese [place] in Treatment: 7 Debridement Performed for Wound #1 Left,Medial Lower Leg Assessment: Performed By: Physician Maxwell Caul, MD Debridement Type: Chemical/Enzymatic/Mechanical Agent Used: Santyl Severity of Tissue Pre Fat layer exposed Debridement: Pre-procedure Verification/Time Yes - 12:05 Out Taken: Start Time: 12:05 Instrument: Other : cotton tipped applictor Bleeding: None End Time: 12:06 Procedural Pain: 0 Post Procedural Pain: 0 Response to Treatment: Procedure was tolerated well Post Debridement Measurements of Total Wound Length: (cm) 2.2 Width: (cm) 1.6 Depth: (cm) 0.4 Volume: (cm) 1.106 Character of Wound/Ulcer Post Debridement: Stable Severity of Tissue Post Debridement: Fat layer exposed Post Procedure Diagnosis Same as Pre-procedure Electronic Signature(s) Signed: 10/26/2017 12:54:19 PM By: Elliot Gurney, BSN, RN, CWS, Kim RN, BSN Signed: 10/26/2017 4:49:21 PM By: Baltazar Najjar MD Entered By: Elliot Gurney, BSN, RN, CWS, Kim on 10/26/2017 12:54:18 Zoe Robinson ([id number]) -------------------------------------------------------------------------------- HPI Details Patient Name: Zoe Robinson Date of Service: 10/26/2017 11:00 AM Medical Record Number: [id number] Patient Account Number: [id number] Date of Birth/Sex: 1931/11/26 (85 y.o. F) Treating RN: Huel Coventry Primary Care Provider: Gabriel Cirri Other Clinician: Referring Provider: Gabriel Cirri Treating Provider/Extender: Altamese [place] in Treatment: 7 History of Present  Illness HPI Description: 09/07/17; this is an 84 year old woman who arrives accompanied by her daughter. She has a history of severe rheumatoid arthritis followed by Dr. Corliss Skains in [place]. By review of care everywhere it appears that she had cellulitis of her left lower leg in September 2018. This may have been caused by a dog scratch. She was followed and her primary care office at [facility] affiliated group. She received antibiotics, Silvadene cream as well as Una boots. This was followed through late October and the wound was healed out on 10/31. The patient and her daughters state that the current wound started with a scissor injury to the medial left calf before the wound was actually healed out in other words this is a different wound area. The original wound was more medial on the left anterior tibial area. Since then they've been using Silvadene cream and apparently she changed doctors who recommended Polysporin and they've been referred here for the injury on her left medial calf. She does not have a history of rheumatoid-related skin issues/vasculitis The patient is a type II diabetic with most recent hemoglobin A1c of 6. She has advanced seropositive rheumatoid arthritis on prednisone 10 mg alternating with 5 and methotrexate. I see she is also on xarelto while Im not really sure why. She has significant osteoporosis, history of hypercalcemia. ABI in this clinic is 1.08. She was a former smoker 09/14/17; patient's wound slightly smaller today however still covered in a very thick adherent necrotic surface. We have been using Santyl under compression. We finally got home health to start yesterday "advanced" 09/21/17; not too much change in the area here. We have been using Santyl under compression and I'll continue this after debridement today. Consider Iodoflex if we don't get a better looking wound surface 09/22/17-she presents today as an urgent follow-up secondary to uncontrolled bleeding  overnight. She has saturated through the 3-layer compression. Upon dressing removal there is significant clot formation, she continues to bleed from the most proximal aspect of the ulcer. 4 silver nitrate  sticks were used to try to obtain hemostasis, along with 10 minutes of pressure dressing. The wound continues to bleed although not as briskly; this appears to be more of a venous bleed and arterial. She is hemodynamically stable upon presentation to the clinic SBP 130's, HR 80's with no complaints of dizziness, lightheadedness. We are unable to obtain any hemostatic agents from the emergency department. Since she has bled through the pressure dressing and continues to bleed despite all efforts available in the clinic she has been sent to the emergency department for evaluation and treatment 09/28/17; the patient has not had an easy time since the last time I saw her. She continues to have nonviable surface over the wound perhaps slightly better using Santyl. She has on xarelto which no doubt contributes to the bleeding after debridement. I'm going to change her to Iodoflex today. She is having a lot of discomfort with this we gave her tramadol which is reasonable 10/05/17;patient continues to have nonviable surface over a very difficult wound.we also note that her propensity to bleed. We have been using Iodoflex. previously minimal response previously to [employer]. She tells me that her rheumatologist is made adjustments to both her prednisone and methotrexate in attempt to help heal this difficult area. One would wonder if this is all venous insufficiency and whether the rheumatoid arthritis itself could be contributing to an inflammatory ulcer/rheumatoid vasculitis 10/12/17 She is here in follow up evaluation for a left lower extremity ulcer. There is no improvement. We will transition to santyl. 10/19/17; she has transitioned back to sample and has been changing this every day. There has been some  improvement although still requiring mechanical debridement 10/25/17; still using Santyl on the area on the left lower extremity. There is improvement for the first time. Recently no debridement was felt to be necessary.she did not respond well to Iodoflex. Request for ferrous skin was denied by her Designer, industrial/product) Signed: 10/26/2017 4:49:21 PM By: Baltazar Najjar MD Zoe Robinson ([id number]) Entered By: Baltazar Najjar on 10/26/2017 12:46:30 Zoe Robinson ([id number]) -------------------------------------------------------------------------------- Physical Exam Details Patient Name: Zoe Robinson Date of Service: 10/26/2017 11:00 AM Medical Record Number: [id number] Patient Account Number: [id number] Date of Birth/Sex: 05/01/1932 (85 y.o. F) Treating RN: Huel Coventry Primary Care Provider: Gabriel Cirri Other Clinician: Referring Provider: Gabriel Cirri Treating Provider/Extender: Maxwell Caul Weeks in Treatment: 7 Constitutional Sitting or standing Blood Pressure is within target range for patient.. Pulse regular and within target range for patient.Marland Kitchen Respirations regular, non-labored and within target range.. Temperature is normal and within the target range for the patient.Marland Kitchen appears in no distress. Notes wound exam still nonviable surface however much better looking than previously. No debridement today. We'll continue to use Santyl. Electronic Signature(s) Signed: 10/26/2017 4:49:21 PM By: Baltazar Najjar MD Entered By: Baltazar Najjar on 10/26/2017 12:45:27 Zoe Robinson ([id number]) -------------------------------------------------------------------------------- Physician Orders Details Patient Name: Zoe Robinson Date of Service: 10/26/2017 11:00 AM Medical Record Number: [id number] Patient Account Number: [id number] Date of Birth/Sex: 12-09-1931 (85 y.o. F) Treating RN: Huel Coventry Primary Care Provider: Gabriel Cirri Other  Clinician: Referring Provider: Gabriel Cirri Treating Provider/Extender: Altamese [place] in Treatment: 7 Verbal / Phone Orders: No Diagnosis Coding Wound Cleansing Wound #1 Left,Medial Lower Leg o Cleanse wound with mild soap and water Anesthetic (add to Medication List) o Topical Lidocaine 4% cream applied to wound bed prior to debridement (In Clinic Only). Skin Barriers/Peri-Wound Care o Barrier cream o Moisturizing lotion Primary Wound Dressing o Santyl Ointment Secondary Dressing  Wound #1 Left,Medial Lower Leg o Dry Gauze o Boardered Foam Dressing Dressing Change Frequency o Change dressing every day. - Patient's daughter will change bandage on days HHRN does not visit Follow-up Appointments o Return Appointment in 1 week. Edema Control o Patient to wear own compression stockings o Elevate legs to the level of the heart and pump ankles as often as possible Home Health o Continue Home Health Visits o Home Health Nurse may visit PRN to address patientos wound care needs. o FACE TO FACE ENCOUNTER: MEDICARE and MEDICAID PATIENTS: I certify that this patient is under my care and that I had a face-to-face encounter that meets the physician face-to-face encounter requirements with this patient on this date. The encounter with the patient was in whole or in part for the following MEDICAL CONDITION: (primary reason for Home Healthcare) MEDICAL NECESSITY: I certify, that based on my findings, NURSING services are a medically necessary home health service. HOME BOUND STATUS: I certify that my clinical findings support that this patient is homebound (i.e., Due to illness or injury, pt requires aid of supportive devices such as crutches, cane, wheelchairs, walkers, the use of special transportation or the assistance of another person to leave their place of residence. There is a normal inability to leave the home and doing so requires considerable  and taxing effort. Other absences are for medical reasons / religious services and are infrequent or of short duration when for other reasons). o If current dressing causes regression in wound condition, may D/C ordered dressing product/s and apply Normal Saline Moist Dressing daily until next Wound Healing Center / Other MD appointment. Notify Wound Healing Center of regression in wound condition at [phone]. o Please direct any NON-WOUND related issues/requests for orders to patient's Primary Care Physician Zoe Robinson, Zoe Robinson ([id number]) Electronic Signature(s) Signed: 10/26/2017 4:49:21 PM By: Baltazar Najjar MD Signed: 10/31/2017 9:08:37 AM By: Elliot Gurney, BSN, RN, CWS, Kim RN, BSN Entered By: Elliot Gurney, BSN, RN, CWS, Kim on 10/26/2017 12:05:07 Zoe Robinson ([id number]) -------------------------------------------------------------------------------- Problem List Details Patient Name: Zoe Robinson Date of Service: 10/26/2017 11:00 AM Medical Record Number: [id number] Patient Account Number: [id number] Date of Birth/Sex: Jun 25, 1932 (85 y.o. 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 09/07/2017 Yes I87.312 Chronic venous hypertension (idiopathic) with ulcer of left 09/07/2017 Yes lower extremity M05.80 Other rheumatoid arthritis with rheumatoid factor of 09/07/2017 Yes unspecified site Inactive Problems Resolved Problems Electronic Signature(s) Signed: 10/26/2017 4:49:21 PM By: Baltazar Najjar MD Entered By: Baltazar Najjar on 10/26/2017 12:42:10 Zoe Robinson ([id number]) -------------------------------------------------------------------------------- Progress Note  Details Patient Name: Zoe Robinson Date of Service: 10/26/2017 11:00 AM Medical Record Number: [id number] Patient Account Number: [id number] Date of Birth/Sex: [birth year] (85 y.o. F) Treating RN: Huel Coventry Primary Care Provider: Gabriel Cirri Other Clinician: Referring Provider: Gabriel Cirri Treating Provider/Extender: Maxwell Caul Weeks in Treatment: 7 Subjective History of Present Illness (HPI) 09/07/17; this is an 84 year old woman who arrives accompanied by her daughter. She has a history of severe rheumatoid arthritis followed by Dr. Corliss Skains in [place]. By review of care everywhere it appears that she had cellulitis of her left lower leg in September 2018. This may have been caused by a dog scratch. She was followed and her primary care office at [facility] affiliated group. She received antibiotics, Silvadene  cream as well as Una boots. This was followed through late October and the wound was healed out on 10/31. The patient and her daughters state that the current wound started with a scissor injury to the medial left calf before the wound was actually healed out in other words this is a different wound area. The original wound was more medial on the left anterior tibial area. Since then they've been using Silvadene cream and apparently she changed doctors who recommended Polysporin and they've been referred here for the injury on her left medial calf. She does not have a history of rheumatoid-related skin issues/vasculitis The patient is a type II diabetic with most recent hemoglobin A1c of 6. She has advanced seropositive rheumatoid arthritis on prednisone 10 mg alternating with 5 and methotrexate. I see she is also on xarelto while Im not really sure why. She has significant osteoporosis, history of hypercalcemia. ABI in this clinic is 1.08. She was a former smoker 09/14/17; patient's wound slightly smaller today however still covered in a very thick adherent necrotic  surface. We have been using Santyl under compression. We finally got home health to start yesterday "advanced" 09/21/17; not too much change in the area here. We have been using Santyl under compression and I'll continue this after debridement today. Consider Iodoflex if we don't get a better looking wound surface 09/22/17-she presents today as an urgent follow-up secondary to uncontrolled bleeding overnight. She has saturated through the 3-layer compression. Upon dressing removal there is significant clot formation, she continues to bleed from the most proximal aspect of the ulcer. 4 silver nitrate sticks were used to try to obtain hemostasis, along with 10 minutes of pressure dressing. The wound continues to bleed although not as briskly; this appears to be more of a venous bleed and arterial. She is hemodynamically stable upon presentation to the clinic SBP 130's, HR 80's with no complaints of dizziness, lightheadedness. We are unable to obtain any hemostatic agents from the emergency department. Since she has bled through the pressure dressing and continues to bleed despite all efforts available in the clinic she has been sent to the emergency department for evaluation and treatment 09/28/17; the patient has not had an easy time since the last time I saw her. She continues to have nonviable surface over the wound perhaps slightly better using Santyl. She has on xarelto which no doubt contributes to the bleeding after debridement. I'm going to change her to Iodoflex today. She is having a lot of discomfort with this we gave her tramadol which is reasonable 10/05/17;patient continues to have nonviable surface over a very difficult wound.we also note that her propensity to bleed. We have been using Iodoflex. previously minimal response previously to [employer]. She tells me that her rheumatologist is made adjustments to both her prednisone and methotrexate in attempt to help heal this difficult area. One  would wonder if this is all venous insufficiency and whether the rheumatoid arthritis itself could be contributing to an inflammatory ulcer/rheumatoid vasculitis 10/12/17 She is here in follow up evaluation for a left lower extremity ulcer. There is no improvement. We will transition to santyl. 10/19/17; she has transitioned back to sample and has been changing this every day. There has been some improvement although still requiring mechanical debridement 10/25/17; still using Santyl on the area on the left lower extremity. There is improvement for the first time. Recently no debridement was felt to be necessary.she did not respond well to Iodoflex. Request for ferrous skin was  denied by her insurance Zoe Robinson, Zoe Robinson ([id number]) Objective Constitutional Sitting or standing Blood Pressure is within target range for patient.. Pulse regular and within target range for patient.Marland Kitchen Respirations regular, non-labored and within target range.. Temperature is normal and within the target range for the patient.Marland Kitchen appears in no distress. Vitals Time Taken: 11:14 AM, Height: 59 in, Weight: 117 lbs, BMI: 23.6, Temperature: 97.9 F, Pulse: 78 bpm, Respiratory Rate: 16 breaths/min, Blood Pressure: 134/60 mmHg. General Notes: wound exam still nonviable surface however much better looking than previously. No debridement today. We'll continue to use Santyl. Integumentary (Hair, Skin) Wound #1 status is Open. Original cause of wound was Trauma. The wound is located on the Left,Medial Lower Leg. The wound measures 2.2cm length x 1.6cm width x 0.4cm depth; 2.765cm^2 area and 1.106cm^3 volume. There is Fat Layer (Subcutaneous Tissue) Exposed exposed. There is no tunneling or undermining noted. There is a large amount of serous drainage noted. The wound margin is flat and intact. There is small (1-33%) pink granulation within the wound bed. There is a large (67-100%) amount of necrotic tissue within the wound bed  including Adherent Slough. The periwound skin appearance exhibited: Scarring. The periwound skin appearance did not exhibit: Callus, Crepitus, Excoriation, Induration, Rash, Dry/Scaly, Maceration, Atrophie Blanche, Cyanosis, Ecchymosis, Hemosiderin Staining, Mottled, Pallor, Rubor, Erythema. Periwound temperature was noted as No Abnormality. The periwound has tenderness on palpation. Assessment Active Problems ICD-10 L97.223 - 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 muscle I87.312 - Chronic venous hypertension (idiopathic) with ulcer of left lower extremity M05.80 - Other rheumatoid arthritis with rheumatoid factor of unspecified site Procedures Wound #1 Pre-procedure diagnosis of Wound #1 is a Venous Leg Ulcer located on the Left,Medial Lower Leg .Severity of Tissue Pre Debridement is: Fat layer exposed. There was a Chemical/Enzymatic/Mechanical debridement performed by Maxwell Caul, MD. With the following instrument(s): cotton tipped applictor. . Agent used was Santyl. A time out was conducted at 12:05, prior to the start of the procedure. There was no bleeding. The procedure was tolerated well with a pain level of 0 throughout and a pain level of 0 following the procedure. Post Debridement Measurements: 2.2cm length x 1.6cm width x 0.4cm depth; 1.106cm^3 volume. Character of Wound/Ulcer Post Debridement is stable. Severity of Tissue Post Debridement is: Fat layer exposed. Zoe Robinson, Zoe Robinson ([id number]) Post procedure Diagnosis Wound #1: Same as Pre-Procedure Plan Wound Cleansing: Wound #1 Left,Medial Lower Leg: Cleanse wound with mild soap and water Anesthetic (add to Medication List): Topical Lidocaine 4% cream applied to wound bed prior to debridement (In Clinic Only). Skin Barriers/Peri-Wound Care: Barrier cream Moisturizing lotion Primary Wound Dressing: Santyl Ointment Secondary Dressing: Wound #1 Left,Medial Lower Leg: Dry Gauze Boardered Foam  Dressing Dressing Change Frequency: Change dressing every day. - Patient's daughter will change bandage on days [facility] does not visit Follow-up Appointments: Return Appointment in 1 week. Edema Control: Patient to wear own compression stockings Elevate legs to the level of the heart and pump ankles as often as possible Home Health: Continue Home Health Visits Home Health Nurse may visit PRN to address patient s wound care needs. FACE TO FACE ENCOUNTER: MEDICARE and MEDICAID PATIENTS: I certify that this patient is under my care and that I had a face-to-face encounter that meets the physician face-to-face encounter requirements with this patient on this date. The encounter with the patient was in whole or in part for the following MEDICAL CONDITION: (primary reason for Home Healthcare) MEDICAL NECESSITY: I certify, that based on my  findings, NURSING services are a medically necessary home health service. HOME BOUND STATUS: I certify that my clinical findings support that this patient is homebound (i.e., Due to illness or injury, pt requires aid of supportive devices such as crutches, cane, wheelchairs, walkers, the use of special transportation or the assistance of another person to leave their place of residence. There is a normal inability to leave the home and doing so requires considerable and taxing effort. Other absences are for medical reasons / religious services and are infrequent or of short duration when for other reasons). If current dressing causes regression in wound condition, may D/C ordered dressing product/s and apply Normal Saline Moist Dressing daily until next Wound Healing Center / Other MD appointment. Notify Wound Healing Center of regression in wound condition at [phone]. Please direct any NON-WOUND related issues/requests for orders to patient's Primary Care Physician #1 continue Santyl/dry gauze and border foam #2 probable debridement next week #3 the surface  looks a lot better this week. I'm uncertain whether denial for TheraSkin was a complete formulary denialor there may be something that would be more likely to be covered #4 Endoform would probably be the best alternative Zoe Robinson, Zoe Robinson ([id number]) Electronic Signature(s) Signed: 10/26/2017 4:22:58 PM By: Elliot Gurney, BSN, RN, CWS, Kim RN, BSN Signed: 10/26/2017 4:49:21 PM By: Baltazar Najjar MD Entered By: Elliot Gurney, BSN, RN, CWS, Kim on 10/26/2017 16:22:58 Zoe Robinson ([id number]) -------------------------------------------------------------------------------- SuperBill Details Patient Name: Zoe Robinson Date of Service: 10/26/2017 Medical Record Number: [id number] Patient Account Number: [id number] Date of Birth/Sex: 04-Dec-1931 (85 y.o. F) Treating RN: Huel Coventry Primary Care Provider: Gabriel Cirri Other Clinician: Referring Provider: Gabriel Cirri Treating Provider/Extender: Altamese [place] in Treatment: 7 Diagnosis Coding ICD-10 Codes Code Description [phone] 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 muscle I87.312 Chronic venous hypertension (idiopathic) with ulcer of left lower extremity M05.80 Other rheumatoid arthritis with rheumatoid factor of unspecified site Facility Procedures CPT4 Code: [id number] Description: [phone] - DEBRIDE W/O ANES NON SELECT Modifier: Quantity: 1 Physician Procedures CPT4 Code: [id number] Description: 41660 - WC PHYS LEVEL 2 - EST PT ICD-10 Diagnosis Description L97.223 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 musc I87.312 Chronic venous hypertension (idiopathic) with ulcer of left l Modifier: le ower extremity Quantity: 1 Electronic Signature(s) Signed: 10/26/2017 12:54:34 PM By: Elliot Gurney, BSN, RN, CWS, Kim RN, BSN Signed: 10/26/2017 4:49:21 PM By: Baltazar Najjar MD Entered By: Elliot Gurney, BSN, RN, CWS, Kim on 10/26/2017 12:54:34

## 2017-11-02 ENCOUNTER — Encounter: Payer: Medicare Other | Admitting: Internal Medicine

## 2017-11-02 DIAGNOSIS — E11622 Type 2 diabetes mellitus with other skin ulcer: Secondary | ICD-10-CM | POA: Diagnosis not present

## 2017-11-04 NOTE — Progress Notes (Signed)
ASENA, IGEL ([id number]) Visit Report for 11/02/2017 Debridement Details Patient Name: Zoe Robinson, Zoe Robinson Date of Service: 11/02/2017 1:00 PM Medical Record Number: [id number] Patient Account Number: [id number] Date of Birth/Sex: 03/16/1932 (85 y.o. F) Treating RN: Huel Coventry Primary Care Provider: Gabriel Cirri Other Clinician: Referring Provider: Gabriel Cirri Treating Provider/Extender: Altamese [place] in Treatment: 8 Debridement Performed for Wound #1 Left,Medial Lower Leg Assessment: Performed By: Physician Maxwell Caul, MD Debridement Type: Debridement Severity of Tissue Pre Fat layer exposed Debridement: Pre-procedure Verification/Time Yes - 13:30 Out Taken: Start Time: 13:31 Pain Control: Other : lidocaine 4% Total Area Debrided (L x W): 2.6 (cm) x 1.6 (cm) = 4.16 (cm) Tissue and other material Viable, Non-Viable, Slough, Subcutaneous, Slough debrided: Level: Skin/Subcutaneous Tissue Debridement Description: Excisional Instrument: Curette Bleeding: Minimum Hemostasis Achieved: Pressure End Time: 13:34 Procedural Pain: 0 Post Procedural Pain: 0 Response to Treatment: Procedure was tolerated well Post Debridement Measurements of Total Wound Length: (cm) 2.6 Width: (cm) 1.6 Depth: (cm) 0.4 Volume: (cm) 1.307 Character of Wound/Ulcer Post Debridement: Requires Further Debridement Severity of Tissue Post Debridement: Fat layer exposed Post Procedure Diagnosis Same as Pre-procedure Electronic Signature(s) Signed: 11/02/2017 1:43:28 PM By: Elliot Gurney, BSN, RN, CWS, Kim RN, BSN Signed: 11/02/2017 5:02:04 PM By: Baltazar Najjar MD Entered By: Baltazar Najjar on 11/02/2017 13:42:02 Zoe Robinson ([id number]) -------------------------------------------------------------------------------- HPI Details Patient Name: Zoe Robinson Date of Service: 11/02/2017 1:00 PM Medical Record Number: [id number] Patient Account Number: [id number] Date of  Birth/Sex: Jan 27, 1932 (85 y.o. F) Treating RN: Huel Coventry Primary Care Provider: Gabriel Cirri Other Clinician: Referring Provider: Gabriel Cirri Treating Provider/Extender: Altamese [place] in Treatment: 8 History of Present Illness HPI Description: 09/07/17; this is an 84 year old woman who arrives accompanied by her daughter. She has a history of severe rheumatoid arthritis followed by Dr. Corliss Skains in [place]. By review of care everywhere it appears that she had cellulitis of her left lower leg in September 2018. This may have been caused by a dog scratch. She was followed and her primary care office at [facility] affiliated group. She received antibiotics, Silvadene cream as well as Una boots. This was followed through late October and the wound was healed out on 10/31. The patient and her daughters state that the current wound started with a scissor injury to the medial left calf before the wound was actually healed out in other words this is a different wound area. The original wound was more medial on the left anterior tibial area. Since then they've been using Silvadene cream and apparently she changed doctors who recommended Polysporin and they've been referred here for the injury on her left medial calf. She does not have a history of rheumatoid-related skin issues/vasculitis The patient is a type II diabetic with most recent hemoglobin A1c of 6. She has advanced seropositive rheumatoid arthritis on prednisone 10 mg alternating with 5 and methotrexate. I see she is also on xarelto while Im not really sure why. She has significant osteoporosis, history of hypercalcemia. ABI in this clinic is 1.08. She was a former smoker 09/14/17; patient's wound slightly smaller today however still covered in a very thick adherent necrotic surface. We have been using Santyl under compression. We finally got home health to start yesterday "advanced" 09/21/17; not too much change in the area here.  We have been using Santyl under compression and I'll continue this after debridement today. Consider Iodoflex if we don't get a better looking wound surface 09/22/17-she presents today as an urgent follow-up secondary to uncontrolled bleeding  overnight. She has saturated through the 3-layer compression. Upon dressing removal there is significant clot formation, she continues to bleed from the most proximal aspect of the ulcer. 4 silver nitrate sticks were used to try to obtain hemostasis, along with 10 minutes of pressure dressing. The wound continues to bleed although not as briskly; this appears to be more of a venous bleed and arterial. She is hemodynamically stable upon presentation to the clinic SBP 130's, HR 80's with no complaints of dizziness, lightheadedness. We are unable to obtain any hemostatic agents from the emergency department. Since she has bled through the pressure dressing and continues to bleed despite all efforts available in the clinic she has been sent to the emergency department for evaluation and treatment 09/28/17; the patient has not had an easy time since the last time I saw her. She continues to have nonviable surface over the wound perhaps slightly better using Santyl. She has on xarelto which no doubt contributes to the bleeding after debridement. I'm going to change her to Iodoflex today. She is having a lot of discomfort with this we gave her tramadol which is reasonable 10/05/17;patient continues to have nonviable surface over a very difficult wound.we also note that her propensity to bleed. We have been using Iodoflex. previously minimal response previously to [employer]. She tells me that her rheumatologist is made adjustments to both her prednisone and methotrexate in attempt to help heal this difficult area. One would wonder if this is all venous insufficiency and whether the rheumatoid arthritis itself could be contributing to an inflammatory  ulcer/rheumatoid vasculitis 10/12/17 She is here in follow up evaluation for a left lower extremity ulcer. There is no improvement. We will transition to santyl. 10/19/17; she has transitioned back to sample and has been changing this every day. There has been some improvement although still requiring mechanical debridement 10/25/17; still using Santyl on the area on the left lower extremity. There is improvement for the first time. Recently no debridement was felt to be necessary.she did not respond well to Iodoflex. Request for ferrous skin was denied by her insurance 11/02/17; still using Santyl. She did not respond well to Iodoflex. We are getting a much better surface that this deep wound. Still requiring debridement.there is no advanced treatment option through her primary insurance [[employer]] Zoe Robinson, Zoe Robinson ([id number]) Electronic Signature(s) Signed: 11/02/2017 5:02:04 PM By: Baltazar Najjar MD Entered By: Baltazar Najjar on 11/02/2017 13:44:29 Zoe Robinson ([id number]) -------------------------------------------------------------------------------- Physical Exam Details Patient Name: Zoe Robinson Date of Service: 11/02/2017 1:00 PM Medical Record Number: [id number] Patient Account Number: [id number] Date of Birth/Sex: 05-Feb-1932 (85 y.o. F) Treating RN: Huel Coventry Primary Care Provider: Gabriel Cirri Other Clinician: Referring Provider: Gabriel Cirri Treating Provider/Extender: Maxwell Caul Weeks in Treatment: 8 Constitutional Sitting or standing Blood Pressure is within target range for patient.. Pulse regular and within target range for patient.Marland Kitchen Respirations regular, non-labored and within target range.. Temperature is normal and within the target range for the patient.Marland Kitchen appears in no distress. Notes wound exam; still a nonviable surface using a #3 curet. The wound was debrided of necrotic subcutaneous debris. I'm able to get this to clean up much better than  the way was 2 or 3 weeks ago. There is no evidence of surrounding infection here. Electronic Signature(s) Signed: 11/02/2017 5:02:04 PM By: Baltazar Najjar MD Entered By: Baltazar Najjar on 11/02/2017 13:43:43 Zoe Robinson ([id number]) -------------------------------------------------------------------------------- Physician Orders Details Patient Name: Zoe Robinson Date of Service: 11/02/2017 1:00 PM Medical Record Number: [id number] Patient Account  Number: [id number] Date of Birth/Sex: 1932/03/17 (84 y.o. F) Treating RN: Huel Coventry Primary Care Provider: Gabriel Cirri Other Clinician: Referring Provider: Gabriel Cirri Treating Provider/Extender: Altamese [place] in Treatment: 8 Verbal / Phone Orders: No Diagnosis Coding Wound Cleansing Wound #1 Left,Medial Lower Leg o Cleanse wound with mild soap and water Anesthetic (add to Medication List) Wound #1 Left,Medial Lower Leg o Topical Lidocaine 4% cream applied to wound bed prior to debridement (In Clinic Only). Skin Barriers/Peri-Wound Care Wound #1 Left,Medial Lower Leg o Barrier cream o Moisturizing lotion Primary Wound Dressing Wound #1 Left,Medial Lower Leg o Santyl Ointment Secondary Dressing Wound #1 Left,Medial Lower Leg o Dry Gauze o Boardered Foam Dressing Dressing Change Frequency Wound #1 Left,Medial Lower Leg o Change dressing every day. - Patient's daughter will change bandage on days HHRN does not visit Follow-up Appointments Wound #1 Left,Medial Lower Leg o Return Appointment in 1 week. Edema Control Wound #1 Left,Medial Lower Leg o Patient to wear own compression stockings o Elevate legs to the level of the heart and pump ankles as often as possible Home Health Wound #1 Left,Medial Lower Leg o Continue Home Health Visits o Home Health Nurse may visit PRN to address patientos wound care needs. o FACE TO FACE ENCOUNTER: MEDICARE and MEDICAID PATIENTS: I  certify that this patient is under my care and that I had a face-to-face encounter that meets the physician face-to-face encounter requirements with this Zoe Robinson, Zoe Robinson ([id number]) patient on this date. The encounter with the patient was in whole or in part for the following MEDICAL CONDITION: (primary reason for Home Healthcare) MEDICAL NECESSITY: I certify, that based on my findings, NURSING services are a medically necessary home health service. HOME BOUND STATUS: I certify that my clinical findings support that this patient is homebound (i.e., Due to illness or injury, pt requires aid of supportive devices such as crutches, cane, wheelchairs, walkers, the use of special transportation or the assistance of another person to leave their place of residence. There is a normal inability to leave the home and doing so requires considerable and taxing effort. Other absences are for medical reasons / religious services and are infrequent or of short duration when for other reasons). o If current dressing causes regression in wound condition, may D/C ordered dressing product/s and apply Normal Saline Moist Dressing daily until next Wound Healing Center / Other MD appointment. Notify Wound Healing Center of regression in wound condition at [phone]. o Please direct any NON-WOUND related issues/requests for orders to patient's Primary Care Physician Electronic Signature(s) Signed: 11/02/2017 1:43:28 PM By: Elliot Gurney, BSN, RN, CWS, Kim RN, BSN Signed: 11/02/2017 5:02:04 PM By: Baltazar Najjar MD Entered By: Elliot Gurney, BSN, RN, CWS, Kim on 11/02/2017 13:36:06 Zoe Robinson ([id number]) -------------------------------------------------------------------------------- Problem List Details Patient Name: Zoe Robinson Date of Service: 11/02/2017 1:00 PM Medical Record Number: [id number] Patient Account Number: [id number] Date of Birth/Sex: Jul 26, 1932 (85 y.o. 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 09/07/2017 Yes I87.312 Chronic venous hypertension (idiopathic) with ulcer of left 09/07/2017 Yes lower extremity M05.80 Other rheumatoid arthritis with rheumatoid factor of 09/07/2017 Yes unspecified site Inactive Problems Resolved Problems Electronic Signature(s) Signed: 11/02/2017 5:02:04 PM By: Baltazar Najjar MD Entered By: Baltazar Najjar on 11/02/2017 13:39:23 Zoe Robinson ([id number]) -------------------------------------------------------------------------------- Progress  Note Details Patient Name: Zoe Robinson, Zoe Robinson Date of Service: 11/02/2017 1:00 PM Medical Record Number: [id number] Patient Account Number: [id number] Date of Birth/Sex: [birth year] (85 y.o. F) Treating RN: Huel Coventry Primary Care Provider: Gabriel Cirri Other Clinician: Referring Provider: Gabriel Cirri Treating Provider/Extender: Maxwell Caul Weeks in Treatment: 8 Subjective History of Present Illness (HPI) 09/07/17; this is an 84 year old woman who arrives accompanied by her daughter. She has a history of severe rheumatoid arthritis followed by Dr. Corliss Skains in [place]. By review of care everywhere it appears that she had cellulitis of her left lower leg in September 2018. This may have been caused by a dog scratch. She was followed and her primary care office at [facility] affiliated group. She received antibiotics, Silvadene cream as well as Una boots. This was followed through late October and the wound was healed out on 10/31. The patient and her daughters state that the current wound started with a scissor injury to the medial left calf before the wound was actually healed out in  other words this is a different wound area. The original wound was more medial on the left anterior tibial area. Since then they've been using Silvadene cream and apparently she changed doctors who recommended Polysporin and they've been referred here for the injury on her left medial calf. She does not have a history of rheumatoid-related skin issues/vasculitis The patient is a type II diabetic with most recent hemoglobin A1c of 6. She has advanced seropositive rheumatoid arthritis on prednisone 10 mg alternating with 5 and methotrexate. I see she is also on xarelto while Im not really sure why. She has significant osteoporosis, history of hypercalcemia. ABI in this clinic is 1.08. She was a former smoker 09/14/17; patient's wound slightly smaller today however still covered in a very thick adherent necrotic surface. We have been using Santyl under compression. We finally got home health to start yesterday "advanced" 09/21/17; not too much change in the area here. We have been using Santyl under compression and I'll continue this after debridement today. Consider Iodoflex if we don't get a better looking wound surface 09/22/17-she presents today as an urgent follow-up secondary to uncontrolled bleeding overnight. She has saturated through the 3-layer compression. Upon dressing removal there is significant clot formation, she continues to bleed from the most proximal aspect of the ulcer. 4 silver nitrate sticks were used to try to obtain hemostasis, along with 10 minutes of pressure dressing. The wound continues to bleed although not as briskly; this appears to be more of a venous bleed and arterial. She is hemodynamically stable upon presentation to the clinic SBP 130's, HR 80's with no complaints of dizziness, lightheadedness. We are unable to obtain any hemostatic agents from the emergency department. Since she has bled through the pressure dressing and continues to bleed despite all efforts  available in the clinic she has been sent to the emergency department for evaluation and treatment 09/28/17; the patient has not had an easy time since the last time I saw her. She continues to have nonviable surface over the wound perhaps slightly better using Santyl. She has on xarelto which no doubt contributes to the bleeding after debridement. I'm going to change her to Iodoflex today. She is having a lot of discomfort with this we gave her tramadol which is reasonable 10/05/17;patient continues to have nonviable surface over a very difficult wound.we also note that her propensity to bleed. We have been using Iodoflex. previously minimal response previously to [employer]. She tells me that  her rheumatologist is made adjustments to both her prednisone and methotrexate in attempt to help heal this difficult area. One would wonder if this is all venous insufficiency and whether the rheumatoid arthritis itself could be contributing to an inflammatory ulcer/rheumatoid vasculitis 10/12/17 She is here in follow up evaluation for a left lower extremity ulcer. There is no improvement. We will transition to santyl. 10/19/17; she has transitioned back to sample and has been changing this every day. There has been some improvement although still requiring mechanical debridement 10/25/17; still using Santyl on the area on the left lower extremity. There is improvement for the first time. Recently no debridement was felt to be necessary.she did not respond well to Iodoflex. Request for ferrous skin was denied by her insurance 11/02/17; still using Santyl. She did not respond well to Iodoflex. We are getting a much better surface that this deep wound. Still requiring debridement.there is no advanced treatment option through her primary insurance [[employer]] Zoe Robinson, Zoe Robinson ([id number]) Objective Constitutional Sitting or standing Blood Pressure is within target range for patient.. Pulse regular and within target range  for patient.Marland Kitchen Respirations regular, non-labored and within target range.. Temperature is normal and within the target range for the patient.Marland Kitchen appears in no distress. Vitals Time Taken: 1:15 PM, Height: 59 in, Weight: 117 lbs, BMI: 23.6, Temperature: 98.3 F, Pulse: 81 bpm, Respiratory Rate: 16 breaths/min, Blood Pressure: 133/51 mmHg. General Notes: wound exam; still a nonviable surface using a #3 curet. The wound was debrided of necrotic subcutaneous debris. I'm able to get this to clean up much better than the way was 2 or 3 weeks ago. There is no evidence of surrounding infection here. Integumentary (Hair, Skin) Wound #1 status is Open. Original cause of wound was Trauma. The wound is located on the Left,Medial Lower Leg. The wound measures 2.6cm length x 1.6cm width x 0.4cm depth; 3.267cm^2 area and 1.307cm^3 volume. There is Fat Layer (Subcutaneous Tissue) Exposed exposed. There is no tunneling or undermining noted. There is a large amount of serous drainage noted. The wound margin is flat and intact. There is medium (34-66%) pink granulation within the wound bed. There is a medium (34-66%) amount of necrotic tissue within the wound bed including Adherent Slough. The periwound skin appearance exhibited: Scarring. The periwound skin appearance did not exhibit: Callus, Crepitus, Excoriation, Induration, Rash, Dry/Scaly, Maceration, Atrophie Blanche, Cyanosis, Ecchymosis, Hemosiderin Staining, Mottled, Pallor, Rubor, Erythema. Periwound temperature was noted as No Abnormality. The periwound has tenderness on palpation. Assessment Active Problems ICD-10 L97.223 - 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 muscle I87.312 - Chronic venous hypertension (idiopathic) with ulcer of left lower extremity M05.80 - Other rheumatoid arthritis with rheumatoid factor of unspecified site Procedures Wound #1 Pre-procedure diagnosis of Wound #1 is a Venous Leg Ulcer located on the  Left,Medial Lower Leg .Severity of Tissue Pre Debridement is: Fat layer exposed. There was a Excisional Skin/Subcutaneous Tissue Debridement with a total area of 4.16 sq cm performed by Maxwell Caul, MD. With the following instrument(s): Curette. to remove Viable and Non-Viable tissue/material Material removed includes Subcutaneous Tissue, and Slough, and Lambert after achieving pain control using Zoe Robinson, Zoe Robinson ([id number]) Other (lidocaine 4%). No specimens were taken. A time out was conducted at 13:30, prior to the start of the procedure. A Minimum amount of bleeding was controlled with Pressure. The procedure was tolerated well with a pain level of 0 throughout and a pain level of 0 following the procedure. Post Debridement Measurements: 2.6cm length x  1.6cm width x 0.4cm depth; 1.307cm^3 volume. Character of Wound/Ulcer Post Debridement requires further debridement. Severity of Tissue Post Debridement is: Fat layer exposed. Post procedure Diagnosis Wound #1: Same as Pre-Procedure Plan Wound Cleansing: Wound #1 Left,Medial Lower Leg: Cleanse wound with mild soap and water Anesthetic (add to Medication List): Wound #1 Left,Medial Lower Leg: Topical Lidocaine 4% cream applied to wound bed prior to debridement (In Clinic Only). Skin Barriers/Peri-Wound Care: Wound #1 Left,Medial Lower Leg: Barrier cream Moisturizing lotion Primary Wound Dressing: Wound #1 Left,Medial Lower Leg: Santyl Ointment Secondary Dressing: Wound #1 Left,Medial Lower Leg: Dry Gauze Boardered Foam Dressing Dressing Change Frequency: Wound #1 Left,Medial Lower Leg: Change dressing every day. - Patient's daughter will change bandage on days HHRN does not visit Follow-up Appointments: Wound #1 Left,Medial Lower Leg: Return Appointment in 1 week. Edema Control: Wound #1 Left,Medial Lower Leg: Patient to wear own compression stockings Elevate legs to the level of the heart and pump ankles as often as  possible Home Health: Wound #1 Left,Medial Lower Leg: Continue Home Health Visits Home Health Nurse may visit PRN to address patient s wound care needs. FACE TO FACE ENCOUNTER: MEDICARE and MEDICAID PATIENTS: I certify that this patient is under my care and that I had a face-to-face encounter that meets the physician face-to-face encounter requirements with this patient on this date. The encounter with the patient was in whole or in part for the following MEDICAL CONDITION: (primary reason for Home Healthcare) MEDICAL NECESSITY: I certify, that based on my findings, NURSING services are a medically necessary home health service. HOME BOUND STATUS: I certify that my clinical findings support that this patient is homebound (i.e., Due to illness or injury, pt requires aid of supportive devices such as crutches, cane, wheelchairs, walkers, the use of special transportation or the assistance of another person to leave their place of residence. There is a normal inability to leave the home and doing so requires considerable and taxing effort. Other absences are for medical reasons / religious services and are infrequent or of short duration when for other reasons). If current dressing causes regression in wound condition, may D/C ordered dressing product/s and apply Normal Saline Moist Dressing daily until next Wound Healing Center / Other MD appointment. Notify Wound Healing Center of regression in wound condition at [phone]. Please direct any NON-WOUND related issues/requests for orders to patient's Primary Care Physician Zoe Robinson, Zoe Robinson ([id number]) #1 continue the Santyl for another week, perhaps 2 #2 we do not have an an option for an advanced treatment product therefore likely use Endoform Electronic Signature(s) Signed: 11/02/2017 5:02:04 PM By: Baltazar Najjar MD Entered By: Baltazar Najjar on 11/02/2017 13:45:16 Zoe Robinson  ([id number]) -------------------------------------------------------------------------------- SuperBill Details Patient Name: Zoe Robinson Date of Service: 11/02/2017 Medical Record Number: [id number] Patient Account Number: [id number] Date of Birth/Sex: 1932-03-15 (85 y.o. 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

## 2017-11-06 NOTE — Progress Notes (Signed)
Zoe Robinson, Zoe Robinson ([id number]) Visit Report for 11/02/2017 Arrival Information Details Patient Name: Zoe Robinson, Zoe Robinson Date of Service: 11/02/2017 1:00 PM Medical Record Number: [id number] Patient Account Number: [id number] Date of Birth/Sex: 1931-11-21 (84 y.o. F) Treating RN: Zoe Robinson Primary Care Fuller Makin: Zoe Robinson Other Clinician: Referring Karlee Staff: Zoe Robinson Treating Zoe Robinson/Extender: Zoe [place] in Treatment: 8 Visit Information History Since Last Visit Added or deleted any medications: No Patient Arrived: Ambulatory Any new allergies or adverse reactions: No Arrival Time: 13:13 Had a fall or experienced change in No Accompanied By: dtr activities of daily living that may affect Transfer Assistance: None risk of falls: Patient Identification Verified: Yes Signs or symptoms of abuse/neglect since last visito No Secondary Verification Process Yes Hospitalized since last visit: No Completed: Implantable device outside of the clinic excluding No Patient Requires Transmission-Based No cellular tissue based products placed in the center Precautions: since last visit: Patient Has Alerts: Yes Has Dressing in Place as Prescribed: Yes Patient Alerts: Patient on Blood Has Compression in Place as Prescribed: Yes Thinner Pain Present Now: No xarelto Electronic Signature(s) Signed: 11/02/2017 5:03:40 PM By: Zoe Robinson Entered By: Zoe Robinson on 11/02/2017 13:14:13 Zoe Robinson ([id number]) -------------------------------------------------------------------------------- Encounter Discharge Information Details Patient Name: Zoe Robinson Date of Service: 11/02/2017 1:00 PM Medical Record Number: [id number] Patient Account Number: [id number] Date of Birth/Sex: 12/18/1931 (84 y.o. F) Treating RN: Zoe Robinson Primary Care Karalyn Robinson: Zoe Robinson Other Clinician: Referring Zoe Robinson: Zoe Robinson Treating Zoe Robinson/Extender: Zoe Robinson in Treatment: 8 Encounter Discharge Information Items Discharge Pain Level: 0 Discharge Condition: Stable Ambulatory Status: Ambulatory Discharge Destination: Home Transportation: Private Auto Accompanied By: daughter Schedule Follow-up Appointment: Yes Medication Reconciliation completed and No provided to Patient/Care Zoe Robinson: Provided on Clinical Summary of Care: 11/02/2017 Form Type Recipient Robinson Patient Zoe Electronic Signature(s) Signed: 11/04/2017 4:32:37 PM By: Zoe Robinson Previous Signature: 11/02/2017 1:43:28 PM Version By: Zoe Robinson, BSN, RN, CWS, Zoe Robinson Entered By: Zoe Robinson on 11/02/2017 13:44:57 Zoe Robinson ([id number]) -------------------------------------------------------------------------------- Lower Extremity Assessment Details Patient Name: Zoe Robinson Date of Service: 11/02/2017 1:00 PM Medical Record Number: [id number] Patient Account Number: [id number] Date of Birth/Sex: 1931/10/17 (84 y.o. F) Treating RN: Zoe Robinson Primary Care Shenita Trego: Zoe Robinson Other Clinician: Referring Kathyleen Radice: Zoe Robinson Treating Zoe Robinson/Extender: Zoe Robinson in Treatment: 8 Edema Assessment Assessed: [Left: No] [Right: No] [Left: Edema] [Right: :] Calf Left: Right: Point of Measurement: 29 cm From Medial Instep 31.2 cm cm Ankle Left: Right: Point of Measurement: 10 cm From Medial Instep 18.2 cm cm Vascular Assessment Pulses: Dorsalis Pedis Palpable: [Left:Yes] Posterior Tibial Extremity colors, hair growth, and conditions: Extremity Color: [Left:Hyperpigmented] Hair Growth on Extremity: [Left:No] Temperature of Extremity: [Left:Warm] Capillary Refill: [Left:< 3 seconds] Electronic Signature(s) Signed: 11/02/2017 5:03:40 PM By: Zoe Robinson Entered By: Zoe Robinson on 11/02/2017 13:20:33 Zoe Robinson  ([id number]) -------------------------------------------------------------------------------- Multi Wound Chart Details Patient Name: Zoe Robinson Date of Service: 11/02/2017 1:00 PM Medical Record Number: [id number] Patient Account Number: [id number] Date of Birth/Sex: 01/06/1932 (84 y.o. F) Treating RN: Zoe Robinson Primary Care Nautica Hotz: Zoe Robinson Other Clinician: Referring Zoe Robinson: Zoe Robinson Treating Zoe Robinson/Extender: Zoe [place] in Treatment: 8 Vital Signs Height(in): 59 Pulse(bpm): 81 Weight(lbs): 117 Blood Pressure(mmHg): 133/51 Body Mass Index(BMI): 24 Temperature(F): 98.3 Respiratory Rate 16 (breaths/min): Photos: [1:No Photos] [N/A:N/A] Wound Location: [1:Left Lower Leg - Medial] [N/A:N/A] Wounding Event: [1:Trauma] [N/A:N/A] Primary Etiology: [1:Venous Leg Ulcer] [N/A:N/A] Comorbid History: [1:Cataracts, Anemia, Arrhythmia, Hypertension, Rheumatoid Arthritis, Osteoarthritis] [N/A:N/A] Date Acquired: [1:05/02/2017] [N/A:N/A] Weeks of Treatment: [1:8] [  N/A:N/A] Wound Status: [1:Open] [N/A:N/A] Measurements L x W x D [1:2.6x1.6x0.4] [N/A:N/A] (cm) Area (cm) : [1:3.267] [N/A:N/A] Volume (cm) : [1:1.307] [N/A:N/A] % Reduction in Area: [1:56.80%] [N/A:N/A] % Reduction in Volume: [1:-72.90%] [N/A:N/A] Classification: [1:Full Thickness Without Exposed Support Structures] [N/A:N/A] Exudate Amount: [1:Large] [N/A:N/A] Exudate Type: [1:Serous] [N/A:N/A] Exudate Color: [1:amber] [N/A:N/A] Wound Margin: [1:Flat and Intact] [N/A:N/A] Granulation Amount: [1:Medium (34-66%)] [N/A:N/A] Granulation Quality: [1:Pink] [N/A:N/A] Necrotic Amount: [1:Medium (34-66%)] [N/A:N/A] Exposed Structures: [1:Fat Layer (Subcutaneous Tissue) Exposed: Yes Fascia: No Tendon: No Muscle: No Joint: No Bone: No] [N/A:N/A] Epithelialization: [1:Small (1-33%)] [N/A:N/A] Periwound Skin Texture: [1:Scarring: Yes Excoriation: No Induration: No] [N/A:N/A] Callus:  No Crepitus: No Rash: No Periwound Skin Moisture: Maceration: No N/A N/A Dry/Scaly: No Periwound Skin Color: Atrophie Blanche: No N/A N/A Cyanosis: No Ecchymosis: No Erythema: No Hemosiderin Staining: No Mottled: No Pallor: No Rubor: No Temperature: No Abnormality N/A N/A Tenderness on Palpation: Yes N/A N/A Wound Preparation: Ulcer Cleansing: N/A N/A Rinsed/Irrigated with Saline Topical Anesthetic Applied: Other: lidocaine 4% Treatment Notes Electronic Signature(s) Signed: 11/02/2017 1:43:28 PM By: Zoe Robinson, BSN, RN, CWS, Zoe Robinson Entered By: Zoe Robinson, BSN, RN, CWS, Zoe on 11/02/2017 13:34:08 Zoe Robinson ([id number]) -------------------------------------------------------------------------------- Multi-Disciplinary Care Plan Details Patient Name: Zoe Robinson Date of Service: 11/02/2017 1:00 PM Medical Record Number: [id number] Patient Account Number: [id number] Date of Birth/Sex: 1931/09/28 (84 y.o. F) Treating RN: Zoe Robinson Primary Care Berenis Corter: Zoe Robinson Other Clinician: Referring Meyli Boice: Zoe Robinson Treating Chetara Kropp/Extender: Zoe [place] in Treatment: 8 Active Inactive ` Abuse / Safety / Falls / Self Care Management Nursing Diagnoses: Potential for falls Goals: Patient will not experience any injury related to falls Date Initiated: 09/07/2017 Target Resolution Date: 12/03/2017 Goal Status: Active Interventions: Assess fall risk on admission and as needed Notes: ` Orientation to the Wound Care Program Nursing Diagnoses: Knowledge deficit related to the wound healing center program Goals: Patient/caregiver will verbalize understanding of the Wound Healing Center Program Date Initiated: 09/07/2017 Target Resolution Date: 12/03/2017 Goal Status: Active Interventions: Provide education on orientation to the wound center Notes: ` Wound/Skin Impairment Nursing Diagnoses: Impaired tissue integrity Goals: Ulcer/skin breakdown  will heal within 14 weeks Date Initiated: 09/07/2017 Target Resolution Date: 12/03/2017 Goal Status: Active Interventions: MAILI, MACCHIO ([id number]) Assess patient/caregiver ability to obtain necessary supplies Assess patient/caregiver ability to perform ulcer/skin care regimen upon admission and as needed Assess ulceration(s) every visit Notes: Electronic Signature(s) Signed: 11/02/2017 1:43:28 PM By: Zoe Robinson, BSN, RN, CWS, Zoe Robinson Entered By: Zoe Robinson, BSN, RN, CWS, Zoe on 11/02/2017 13:34:00 Zoe Robinson ([id number]) -------------------------------------------------------------------------------- Pain Assessment Details Patient Name: Zoe Robinson Date of Service: 11/02/2017 1:00 PM Medical Record Number: [id number] Patient Account Number: [id number] Date of Birth/Sex: [birth year] (84 y.o. F) Treating RN: Zoe Robinson Primary Care Dierdra Salameh: Zoe Robinson Other Clinician: Referring Iriel Nason: Zoe Robinson Treating Jacki Couse/Extender: Zoe [place] in Treatment: 8 Active Problems Location of Pain Severity and Description of Pain Patient Has Paino Yes Site Locations Pain Location: Pain in Ulcers With Dressing Change: Yes Duration of the Pain. Constant / Intermittento Intermittent Character of Pain Describe the Pain: Other: stings Pain Management and Medication Current Pain Management: Electronic Signature(s) Signed: 11/02/2017 5:03:40 PM By: Zoe Robinson Entered By: Zoe Robinson on 11/02/2017 13:14:30 Zoe Robinson ([id number]) -------------------------------------------------------------------------------- Patient/Caregiver Education Details Patient Name: Zoe Robinson Date of Service: 11/02/2017 1:00 PM Medical Record Number: [id number] Patient Account Number: [id number] Date of Birth/Gender: June 14, 1932 (84 y.o. F) Treating RN: Zoe Robinson Primary Care Physician: Zoe Robinson Other Clinician: Referring Physician: Lequita Halt,  CHRISTI Treating Physician/Extender: Zoe [place] in Treatment: 8 Education Assessment Education Provided To: Patient Education Topics Provided Venous: Handouts: Controlling Swelling with Compression Stockings Methods: Demonstration, Explain/Verbal Responses: State content correctly Wound/Skin Impairment: Handouts: Caring for Your Ulcer Methods: Demonstration, Explain/Verbal Responses: State content correctly Electronic Signature(s) Signed: 11/02/2017 1:43:28 PM By: Zoe Robinson, BSN, RN, CWS, Zoe Robinson Entered By: Zoe Robinson, BSN, RN, CWS, Zoe on 11/02/2017 13:43:14 Zoe Robinson ([id number]) -------------------------------------------------------------------------------- Wound Assessment Details Patient Name: Zoe Robinson Date of Service: 11/02/2017 1:00 PM Medical Record Number: [id number] Patient Account Number: [id number] Date of Birth/Sex: April 29, 1932 (84 y.o. F) Treating RN: Zoe Robinson Primary Care Twinkle Sockwell: Zoe Robinson Other Clinician: Referring [place] Harral: Zoe Robinson Treating Claudell Wohler/Extender: Zoe [place] in Treatment: 8 Wound Status Wound Number: 1 Primary Venous Leg Ulcer Etiology: Wound Location: Left Lower Leg - Medial Wound Open Wounding Event: Trauma Status: Date Acquired: 05/02/2017 Comorbid Cataracts, Anemia, Arrhythmia, Hypertension, Weeks Of Treatment: 8 History: Rheumatoid Arthritis, Osteoarthritis Clustered Wound: No Photos Photo Uploaded By: Zoe Robinson on 11/02/2017 15:10:51 Wound Measurements Length: (cm) 2.6 Width: (cm) 1.6 Depth: (cm) 0.4 Area: (cm) 3.267 Volume: (cm) 1.307 % Reduction in Area: 56.8% % Reduction in Volume: -72.9% Epithelialization: Small (1-33%) Tunneling: No Undermining: No Wound Description Full Thickness Without Exposed Support Classification: Structures Wound Margin: Flat and Intact Exudate Large Amount: Exudate Type: Serous Exudate Color: amber Foul Odor After Cleansing:  No Slough/Fibrino Yes Wound Bed Granulation Amount: Medium (34-66%) Exposed Structure Granulation Quality: Pink Fascia Exposed: No Necrotic Amount: Medium (34-66%) Fat Layer (Subcutaneous Tissue) Exposed: Yes Necrotic Quality: Adherent Slough Tendon Exposed: No Muscle Exposed: No Joint Exposed: No Bone Exposed: No Rochette, Adonna ([id number]) Periwound Skin Texture Texture Color No Abnormalities Noted: No No Abnormalities Noted: No Callus: No Atrophie Blanche: No Crepitus: No Cyanosis: No Excoriation: No Ecchymosis: No Induration: No Erythema: No Rash: No Hemosiderin Staining: No Scarring: Yes Mottled: No Pallor: No Moisture Rubor: No No Abnormalities Noted: No Dry / Scaly: No Temperature / Pain Maceration: No Temperature: No Abnormality Tenderness on Palpation: Yes Wound Preparation Ulcer Cleansing: Rinsed/Irrigated with Saline Topical Anesthetic Applied: Other: lidocaine 4%, Treatment Notes Wound #1 (Left, Medial Lower Leg) 1. Cleansed with: Clean wound with Normal Saline 2. Anesthetic Topical Lidocaine 4% cream to wound bed prior to debridement 4. Dressing Applied: Santyl Ointment 5. Secondary Dressing Applied Bordered Foam Dressing 6. Footwear/Offloading device applied Compression stockings Electronic Signature(s) Signed: 11/02/2017 5:03:40 PM By: Zoe Robinson Entered By: Zoe Robinson on 11/02/2017 13:19:31 Zoe Robinson ([id number]) -------------------------------------------------------------------------------- Vitals Details Patient Name: Zoe Robinson Date of Service: 11/02/2017 1:00 PM Medical Record Number: [id number] Patient Account Number: [id number] Date of Birth/Sex: 26-Jun-1932 (84 y.o. F) Treating RN: Zoe Robinson Primary Care Sabree Nuon: Zoe Robinson Other Clinician: Referring Marte Celani: Zoe Robinson Treating Malky Rudzinski/Extender: Zoe [place] in Treatment: 8 Vital Signs Time Taken: 13:15 Temperature (F):  98.3 Height (in): 59 Pulse (bpm): 81 Weight (lbs): 117 Respiratory Rate (breaths/min): 16 Body Mass Index (BMI): 23.6 Blood Pressure (mmHg): 133/51 Reference Range: 80 - 120 mg / dl Electronic Signature(s) Signed: 11/02/2017 5:03:40 PM By: Zoe Robinson Entered By: Zoe Robinson on 11/02/2017 13:16:26

## 2017-11-09 ENCOUNTER — Encounter: Payer: Medicare Other | Admitting: Internal Medicine

## 2017-11-09 DIAGNOSIS — E11622 Type 2 diabetes mellitus with other skin ulcer: Secondary | ICD-10-CM | POA: Diagnosis not present

## 2017-11-14 NOTE — Progress Notes (Signed)
BRYTTANI, BLEW ([id number]) Visit Report for 11/09/2017 Debridement Details Patient Name: Zoe Robinson, Zoe Robinson Date of Service: 11/09/2017 1:45 PM Medical Record Number: [id number] Patient Account Number: [id number] Date of Birth/Sex: Aug 23, 1931 (85 y.o. F) Treating RN: Huel Coventry Primary Care Provider: Gabriel Cirri Other Clinician: Referring Provider: Gabriel Cirri Treating Provider/Extender: Altamese [place] in Treatment: 9 Debridement Performed for Wound #1 Left,Medial Lower Leg Assessment: Performed By: Physician Maxwell Caul, MD Debridement Type: Debridement Severity of Tissue Pre Fat layer exposed Debridement: Pre-procedure Verification/Time Yes - 14:31 Out Taken: Start Time: 14:31 Pain Control: Other : benzocaine Total Area Debrided (L x W): 2.2 (cm) x 1.4 (cm) = 3.08 (cm) Tissue and other material Viable, Non-Viable, Subcutaneous debrided: Level: Skin/Subcutaneous Tissue Debridement Description: Excisional Instrument: Curette Bleeding: Minimum Hemostasis Achieved: Pressure End Time: 14:33 Procedural Pain: 2 Post Procedural Pain: 0 Response to Treatment: Procedure was tolerated well Post Debridement Measurements of Total Wound Length: (cm) 2.2 Width: (cm) 1.4 Depth: (cm) 0.4 Volume: (cm) 0.968 Character of Wound/Ulcer Post Debridement: Stable Severity of Tissue Post Debridement: Fat layer exposed Post Procedure Diagnosis Same as Pre-procedure Electronic Signature(s) Signed: 11/09/2017 5:09:03 PM By: Baltazar Najjar MD Signed: 11/09/2017 5:11:52 PM By: Elliot Gurney, BSN, RN, CWS, Kim RN, BSN Entered By: Baltazar Najjar on 11/09/2017 16:20:49 Zoe Robinson ([id number]) -------------------------------------------------------------------------------- HPI Details Patient Name: Zoe Robinson Date of Service: 11/09/2017 1:45 PM Medical Record Number: [id number] Patient Account Number: [id number] Date of Birth/Sex: [birth year] (85 y.o. F) Treating  RN: Huel Coventry Primary Care Provider: Gabriel Cirri Other Clinician: Referring Provider: Gabriel Cirri Treating Provider/Extender: Altamese [place] in Treatment: 9 History of Present Illness HPI Description: 09/07/17; this is an 84 year old woman who arrives accompanied by her daughter. She has a history of severe rheumatoid arthritis followed by Dr. Corliss Skains in [place]. By review of care everywhere it appears that she had cellulitis of her left lower leg in September 2018. This may have been caused by a dog scratch. She was followed and her primary care office at [facility] affiliated group. She received antibiotics, Silvadene cream as well as Una boots. This was followed through late October and the wound was healed out on 10/31. The patient and her daughters state that the current wound started with a scissor injury to the medial left calf before the wound was actually healed out in other words this is a different wound area. The original wound was more medial on the left anterior tibial area. Since then they've been using Silvadene cream and apparently she changed doctors who recommended Polysporin and they've been referred here for the injury on her left medial calf. She does not have a history of rheumatoid-related skin issues/vasculitis The patient is a type II diabetic with most recent hemoglobin A1c of 6. She has advanced seropositive rheumatoid arthritis on prednisone 10 mg alternating with 5 and methotrexate. I see she is also on xarelto while Im not really sure why. She has significant osteoporosis, history of hypercalcemia. ABI in this clinic is 1.08. She was a former smoker 09/14/17; patient's wound slightly smaller today however still covered in a very thick adherent necrotic surface. We have been using Santyl under compression. We finally got home health to start yesterday "advanced" 09/21/17; not too much change in the area here. We have been using Santyl under compression  and I'll continue this after debridement today. Consider Iodoflex if we don't get a better looking wound surface 09/22/17-she presents today as an urgent follow-up secondary to uncontrolled bleeding overnight. She has saturated through  the 3-layer compression. Upon dressing removal there is significant clot formation, she continues to bleed from the most proximal aspect of the ulcer. 4 silver nitrate sticks were used to try to obtain hemostasis, along with 10 minutes of pressure dressing. The wound continues to bleed although not as briskly; this appears to be more of a venous bleed and arterial. She is hemodynamically stable upon presentation to the clinic SBP 130's, HR 80's with no complaints of dizziness, lightheadedness. We are unable to obtain any hemostatic agents from the emergency department. Since she has bled through the pressure dressing and continues to bleed despite all efforts available in the clinic she has been sent to the emergency department for evaluation and treatment 09/28/17; the patient has not had an easy time since the last time I saw her. She continues to have nonviable surface over the wound perhaps slightly better using Santyl. She has on xarelto which no doubt contributes to the bleeding after debridement. I'm going to change her to Iodoflex today. She is having a lot of discomfort with this we gave her tramadol which is reasonable 10/05/17;patient continues to have nonviable surface over a very difficult wound.we also note that her propensity to bleed. We have been using Iodoflex. previously minimal response previously to [employer]. She tells me that her rheumatologist is made adjustments to both her prednisone and methotrexate in attempt to help heal this difficult area. One would wonder if this is all venous insufficiency and whether the rheumatoid arthritis itself could be contributing to an inflammatory ulcer/rheumatoid vasculitis 10/12/17 She is here in follow up  evaluation for a left lower extremity ulcer. There is no improvement. We will transition to santyl. 10/19/17; she has transitioned back to santyl and has been changing this every day. There has been some improvement although still requiring mechanical debridement 10/25/17; still using Santyl on the area on the left lower extremity. There is improvement for the first time. Recently no debridement was felt to be necessary.she did not respond well to Iodoflex. Request for fThera skin was denied by her insurance 11/02/17; still using Santyl. She did not respond well to Iodoflex. We are getting a much better surface that this deep wound. Still requiring debridement.there is no advanced treatment option through her primary insurance [[employer]] 11/09/17; we are still using Santyl. Better looking surface still requiring mechanical debridement. There is on the left medial Buick, Farrell ([id number]) lower extremity.this initially started at the time of cellulitis in the left leg which may have been caused by a dog scratch. Had a lot of trouble getting in adequate surface on this wound. We did use Iodoflex for a period of time. we have not been able to get a proper surface to consider an alternative dressing Electronic Signature(s) Signed: 11/09/2017 5:09:03 PM By: Baltazar Najjar MD Entered By: Baltazar Najjar on 11/09/2017 16:24:58 Zoe Robinson ([id number]) -------------------------------------------------------------------------------- Physical Exam Details Patient Name: Zoe Robinson Date of Service: 11/09/2017 1:45 PM Medical Record Number: [id number] Patient Account Number: [id number] Date of Birth/Sex: [birth year] (85 y.o. F) Treating RN: Huel Coventry Primary Care Provider: Gabriel Cirri Other Clinician: Referring Provider: Gabriel Cirri Treating Provider/Extender: Maxwell Caul We[place] in Treatment: 9 Constitutional Sitting or standing Blood Pressure is within target range for  patient.. Pulse regular and within target range for patient.Marland Kitchen Respirations regular, non-labored and within target range.. Temperature is normal and within the target range for the patient.Marland Kitchen appears in no distress. Notes wound exam; still a nonviable surface. Using a #3 curet the wound was  debrided. Post debridement the wound surface looks adequate. I'll reached time she comes in there is a tightly adherent fibrinous surface. We removed nonviable surface subcutaneous tissue. Hemostasis with direct pressure she tolerates this reasonably well Electronic Signature(s) Signed: 11/09/2017 5:09:03 PM By: Baltazar Najjar MD Entered By: Baltazar Najjar on 11/09/2017 16:26:06 Zoe Robinson ([id number]) -------------------------------------------------------------------------------- Physician Orders Details Patient Name: Zoe Robinson Date of Service: 11/09/2017 1:45 PM Medical Record Number: [id number] Patient Account Number: [id number] Date of Birth/Sex: 1931-10-06 (85 y.o. F) Treating RN: Huel Coventry Primary Care Provider: Gabriel Cirri Other Clinician: Referring Provider: Gabriel Cirri Treating Provider/Extender: Altamese [place] in Treatment: 9 Verbal / Phone Orders: No Diagnosis Coding Wound Cleansing Wound #1 Left,Medial Lower Leg o Cleanse wound with mild soap and water Anesthetic (add to Medication List) Wound #1 Left,Medial Lower Leg o Topical Lidocaine 4% cream applied to wound bed prior to debridement (In Clinic Only). Skin Barriers/Peri-Wound Care Wound #1 Left,Medial Lower Leg o Barrier cream o Moisturizing lotion Primary Wound Dressing Wound #1 Left,Medial Lower Leg o Santyl Ointment Secondary Dressing Wound #1 Left,Medial Lower Leg o Dry Gauze o Boardered Foam Dressing Dressing Change Frequency Wound #1 Left,Medial Lower Leg o Change dressing every day. - Patient's daughter will change bandage on days HHRN does not visit Follow-up  Appointments Wound #1 Left,Medial Lower Leg o Return Appointment in 1 week. Edema Control Wound #1 Left,Medial Lower Leg o Patient to wear own compression stockings o Elevate legs to the level of the heart and pump ankles as often as possible Home Health Wound #1 Left,Medial Lower Leg o Continue Home Health Visits o Home Health Nurse may visit PRN to address patientos wound care needs. o FACE TO FACE ENCOUNTER: MEDICARE and MEDICAID PATIENTS: I certify that this patient is under my care and that I had a face-to-face encounter that meets the physician face-to-face encounter requirements with this Zoe Robinson, Zoe Robinson ([id number]) patient on this date. The encounter with the patient was in whole or in part for the following MEDICAL CONDITION: (primary reason for Home Healthcare) MEDICAL NECESSITY: I certify, that based on my findings, NURSING services are a medically necessary home health service. HOME BOUND STATUS: I certify that my clinical findings support that this patient is homebound (i.e., Due to illness or injury, pt requires aid of supportive devices such as crutches, cane, wheelchairs, walkers, the use of special transportation or the assistance of another person to leave their place of residence. There is a normal inability to leave the home and doing so requires considerable and taxing effort. Other absences are for medical reasons / religious services and are infrequent or of short duration when for other reasons). o If current dressing causes regression in wound condition, may D/C ordered dressing product/s and apply Normal Saline Moist Dressing daily until next Wound Healing Center / Other MD appointment. Notify Wound Healing Center of regression in wound condition at [phone]. o Please direct any NON-WOUND related issues/requests for orders to patient's Primary Care Physician Electronic Signature(s) Signed: 11/09/2017 5:09:03 PM By: Baltazar Najjar MD Signed:  11/09/2017 5:11:52 PM By: Elliot Gurney, BSN, RN, CWS, Kim RN, BSN Entered By: Elliot Gurney, BSN, RN, CWS, Kim on 11/09/2017 14:34:15 Zoe Robinson ([id number]) -------------------------------------------------------------------------------- Problem List Details Patient Name: Zoe Robinson Date of Service: 11/09/2017 1:45 PM Medical Record Number: [id number] Patient Account Number: [id number] Date of Birth/Sex: 1932-05-19 (85 y.o. 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 09/07/2017 Yes I87.312 Chronic venous hypertension (idiopathic) with ulcer of left 09/07/2017 Yes lower extremity M05.80 Other rheumatoid arthritis with rheumatoid factor of 09/07/2017 Yes unspecified site Inactive Problems Resolved Problems Electronic Signature(s) Signed: 11/09/2017 5:09:03 PM By: Baltazar Najjar MD Entered By: Baltazar Najjar on 11/09/2017 16:19:52 Zoe Robinson ([id number]) -------------------------------------------------------------------------------- Progress Note Details Patient Name: Zoe Robinson Date of Service: 11/09/2017 1:45 PM Medical Record Number: [id number] Patient Account Number: [id number] Date of Birth/Sex: [birth year] (85 y.o. F) Treating RN: Huel Coventry Primary Care Provider: Gabriel Cirri Other Clinician: Referring Provider: Gabriel Cirri Treating Provider/Extender: Altamese [place] in Treatment: 9 Subjective History of Present Illness (HPI) 09/07/17; this is an 84 year old woman who arrives accompanied by her daughter. She has a history of severe rheumatoid arthritis followed by Dr. Corliss Skains in [place]. By review of care everywhere it appears that she had  cellulitis of her left lower leg in September 2018. This may have been caused by a dog scratch. She was followed and her primary care office at [facility] affiliated group. She received antibiotics, Silvadene cream as well as Una boots. This was followed through late October and the wound was healed out on 10/31. The patient and her daughters state that the current wound started with a scissor injury to the medial left calf before the wound was actually healed out in other words this is a different wound area. The original wound was more medial on the left anterior tibial area. Since then they've been using Silvadene cream and apparently she changed doctors who recommended Polysporin and they've been referred here for the injury on her left medial calf. She does not have a history of rheumatoid-related skin issues/vasculitis The patient is a type II diabetic with most recent hemoglobin A1c of 6. She has advanced seropositive rheumatoid arthritis on prednisone 10 mg alternating with 5 and methotrexate. I see she is also on xarelto while Im not really sure why. She has significant osteoporosis, history of hypercalcemia. ABI in this clinic is 1.08. She was a former smoker 09/14/17; patient's wound slightly smaller today however still covered in a very thick adherent necrotic surface. We have been using Santyl under compression. We finally got home health to start yesterday "advanced" 09/21/17; not too much change in the area here. We have been using Santyl under compression and I'll continue this after debridement today. Consider Iodoflex if we don't get a better looking wound surface 09/22/17-she presents today as an urgent follow-up secondary to uncontrolled bleeding overnight. She has saturated through the 3-layer compression. Upon dressing removal there is significant clot formation, she continues to bleed from the most proximal aspect of the ulcer. 4 silver nitrate sticks were used to try to obtain  hemostasis, along with 10 minutes of pressure dressing. The wound continues to bleed although not as briskly; this appears to be more of a venous bleed and arterial. She is hemodynamically stable upon presentation to the clinic SBP 130's, HR 80's with no complaints of dizziness, lightheadedness. We are unable to obtain any hemostatic agents from the emergency department. Since she has bled through the pressure dressing and continues to bleed despite all efforts available in the clinic she has been sent to the emergency department for evaluation and treatment 09/28/17; the patient has not had an easy time since the last time I saw her. She continues to have nonviable surface over the  wound perhaps slightly better using Santyl. She has on xarelto which no doubt contributes to the bleeding after debridement. I'm going to change her to Iodoflex today. She is having a lot of discomfort with this we gave her tramadol which is reasonable 10/05/17;patient continues to have nonviable surface over a very difficult wound.we also note that her propensity to bleed. We have been using Iodoflex. previously minimal response previously to [employer]. She tells me that her rheumatologist is made adjustments to both her prednisone and methotrexate in attempt to help heal this difficult area. One would wonder if this is all venous insufficiency and whether the rheumatoid arthritis itself could be contributing to an inflammatory ulcer/rheumatoid vasculitis 10/12/17 She is here in follow up evaluation for a left lower extremity ulcer. There is no improvement. We will transition to santyl. 10/19/17; she has transitioned back to santyl and has been changing this every day. There has been some improvement although still requiring mechanical debridement 10/25/17; still using Santyl on the area on the left lower extremity. There is improvement for the first time. Recently no debridement was felt to be necessary.she did not respond  well to Iodoflex. Request for fThera skin was denied by her insurance 11/02/17; still using Santyl. She did not respond well to Iodoflex. We are getting a much better surface that this deep wound. Still requiring debridement.there is no advanced treatment option through her primary insurance Zoe Robinson, Zoe Robinson ([id number]) 11/09/17; we are still using Santyl. Better looking surface still requiring mechanical debridement. There is on the left medial lower extremity.this initially started at the time of cellulitis in the left leg which may have been caused by a dog scratch. Had a lot of trouble getting in adequate surface on this wound. We did use Iodoflex for a period of time. we have not been able to get a proper surface to consider an alternative dressing Objective Constitutional Sitting or standing Blood Pressure is within target range for patient.. Pulse regular and within target range for patient.Marland Kitchen Respirations regular, non-labored and within target range.. Temperature is normal and within the target range for the patient.Marland Kitchen appears in no distress. Vitals Time Taken: 2:10 PM, Height: 59 in, Weight: 117 lbs, BMI: 23.6, Temperature: 98.1 F, Pulse: 74 bpm, Respiratory Rate: 16 breaths/min, Blood Pressure: 118/48 mmHg. General Notes: wound exam; still a nonviable surface. Using a #3 curet the wound was debrided. Post debridement the wound surface looks adequate. I'll reached time she comes in there is a tightly adherent fibrinous surface. We removed nonviable surface subcutaneous tissue. Hemostasis with direct pressure she tolerates this reasonably well Integumentary (Hair, Skin) Wound #1 status is Open. Original cause of wound was Trauma. The wound is located on the Left,Medial Lower Leg. The wound measures 2.2cm length x 1.4cm width x 0.3cm depth; 2.419cm^2 area and 0.726cm^3 volume. There is Fat Layer (Subcutaneous Tissue) Exposed exposed. There is no tunneling or undermining noted.  There is a large amount of serous drainage noted. The wound margin is flat and intact. There is large (67-100%) red, pink granulation within the wound bed. There is a small (1-33%) amount of necrotic tissue within the wound bed including Adherent Slough. The periwound skin appearance exhibited: Scarring. The periwound skin appearance did not exhibit: Callus, Crepitus, Excoriation, Induration, Rash, Dry/Scaly, Maceration, Atrophie Blanche, Cyanosis, Ecchymosis, Hemosiderin Staining, Mottled, Pallor, Rubor, Erythema. Periwound temperature was noted as No Abnormality. The periwound has tenderness on palpation. 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 There was a Excisional Skin/Subcutaneous Tissue Debridement with a total area of 3.08 sq cm performed by Maxwell Caul, MD. With the following instrument(s): Curette. to remove Viable and Non-Viable tissue/material Material removed includes Subcutaneous Tissue after achieving pain control using Other (benzocaine). No specimens were taken. A time out was conducted at 14:31, prior to the start of the procedure. A Minimum amount of bleeding was controlled with Pressure. The procedure was tolerated well with a pain level of 2 throughout and a pain level of 0 following the procedure. Post Debridement Measurements: 2.2cm length x 1.4cm width x 0.4cm depth; 0.968cm^3 volume. Character of Wound/Ulcer Post Debridement is stable. Severity of Tissue Post Debridement is: Fat layer exposed. Post procedure Diagnosis Wound #1: Same  as Pre-Procedure Plan Wound Cleansing: Wound #1 Left,Medial Lower Leg: Cleanse wound with mild soap and water Anesthetic (add to Medication List): Wound #1 Left,Medial Lower Leg: Topical Lidocaine 4% cream applied to wound bed prior to debridement (In Clinic Only). Skin Barriers/Peri-Wound Care: Wound #1 Left,Medial Lower Leg: Barrier cream Moisturizing lotion Primary Wound Dressing: Wound #1 Left,Medial Lower Leg: Santyl Ointment Secondary Dressing: Wound #1 Left,Medial Lower Leg: Dry Gauze Boardered Foam Dressing Dressing Change Frequency: Wound #1 Left,Medial Lower Leg: Change dressing every day. - Patient's daughter will change bandage on days HHRN does not visit Follow-up Appointments: Wound #1 Left,Medial Lower Leg: Return Appointment in 1 week. Edema Control: Wound #1 Left,Medial Lower Leg: Patient to wear own compression stockings Elevate legs to the level of the heart and pump ankles as often as possible Home Health: Wound #1 Left,Medial Lower Leg: Continue Home Health Visits Home Health Nurse may visit PRN to address patient s wound care needs. FACE TO FACE ENCOUNTER: MEDICARE and MEDICAID PATIENTS: I certify that this patient is under my care and that I had a face-to-face encounter that meets the physician face-to-face encounter requirements with this patient on this date. The encounter with the patient was in whole or in part for the following MEDICAL CONDITION: (primary reason for Home Healthcare) MEDICAL NECESSITY: I certify, that based on my findings, NURSING services are a medically necessary home health service. HOME BOUND STATUS: I certify that my clinical findings support that this patient is homebound (i.e., Due to illness or injury, pt requires aid of supportive devices such as crutches, cane, wheelchairs, walkers, the use of special transportation or the assistance of another person to leave their place of residence. There is a normal inability to leave  the home and doing so requires considerable and taxing effort. Other absences are for medical reasons / religious services and are infrequent or of short duration when for other reasons). Zoe Robinson, Zoe Robinson ([id number]) If current dressing causes regression in wound condition, may D/C ordered dressing product/s and apply Normal Saline Moist Dressing daily until next Wound Healing Center / Other MD appointment. Notify Wound Healing Center of regression in wound condition at [phone]. Please direct any NON-WOUND related issues/requests for orders to patient's Primary Care Physician #1 at this point continue Santyl as the primary dressingwith border foam cover that the patient is changing. #2 we do not have an advanced treatment option. Endoform will be the treatment of choice  once the surface of the wound will allow Electronic Signature(s) Signed: 11/09/2017 5:09:03 PM By: Baltazar Najjar MD Entered By: Baltazar Najjar on 11/09/2017 16:28:02 Zoe Robinson ([id number]) -------------------------------------------------------------------------------- SuperBill Details Patient Name: Zoe Robinson Date of Service: 11/09/2017 Medical Record Number: [id number] Patient Account Number: [id number] Date of Birth/Sex: 02-17-1932 (85 y.o. 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

## 2017-11-14 NOTE — Progress Notes (Signed)
TIAJAH, OYSTER ([id number]) Visit Report for 11/09/2017 Arrival Information Details Patient Name: Zoe Robinson, Zoe Robinson Date of Service: 11/09/2017 1:45 PM Medical Record Number: [id number] Patient Account Number: [id number] Date of Birth/Sex: [birth year] (85 y.o. F) Treating RN: Renne Crigler Primary Care Zakariyya Helfman: Gabriel Cirri Other Clinician: Referring Sareen Randon: Gabriel Cirri Treating Teira Arcilla/Extender: Altamese [place] in Treatment: 9 Visit Information History Since Last Visit All ordered tests and consults were completed: No Patient Arrived: Ambulatory Added or deleted any medications: No Arrival Time: 14:09 Any new allergies or adverse reactions: No Accompanied By: daughter Had a fall or experienced change in No Transfer Assistance: None activities of daily living that may affect Patient Identification Verified: Yes risk of falls: Secondary Verification Process Yes Signs or symptoms of abuse/neglect since last visito No Completed: Hospitalized since last visit: No Patient Requires Transmission-Based No Implantable device outside of the clinic excluding No Precautions: cellular tissue based products placed in the center Patient Has Alerts: Yes since last visit: Patient Alerts: Patient on Blood Pain Present Now: No Thinner xarelto Electronic Signature(s) Signed: 11/09/2017 4:34:44 PM By: Renne Crigler Entered By: Renne Crigler on 11/09/2017 14:10:13 Zoe Robinson ([id number]) -------------------------------------------------------------------------------- Encounter Discharge Information Details Patient Name: Zoe Robinson Date of Service: 11/09/2017 1:45 PM Medical Record Number: [id number] Patient Account Number: [id number] Date of Birth/Sex: 1931/10/25 (85 y.o. F) Treating RN: Huel Coventry Primary Care Amerika Nourse: Gabriel Cirri Other Clinician: Referring Tika Hannis: Gabriel Cirri Treating Raimundo Corbit/Extender: Altamese [place] in  Treatment: 9 Encounter Discharge Information Items Discharge Pain Level: 0 Discharge Condition: Stable Ambulatory Status: Ambulatory Discharge Destination: Home Transportation: Private Auto Accompanied By: dtr Schedule Follow-up Appointment: Yes Medication Reconciliation completed and No provided to Patient/Care Clois Montavon: Provided on Clinical Summary of Care: 11/09/2017 Form Type Recipient Paper Patient JK Electronic Signature(s) Signed: 11/09/2017 4:39:21 PM By: Curtis Sites Entered By: Curtis Sites on 11/09/2017 14:43:09 Zoe Robinson ([id number]) -------------------------------------------------------------------------------- Lower Extremity Assessment Details Patient Name: Zoe Robinson Date of Service: 11/09/2017 1:45 PM Medical Record Number: [id number] Patient Account Number: [id number] Date of Birth/Sex: 1931-09-07 (85 y.o. F) Treating RN: Renne Crigler Primary Care Arlander Gillen: Gabriel Cirri Other Clinician: Referring Emanie Behan: Gabriel Cirri Treating Alianna Wurster/Extender: Altamese [place] in Treatment: 9 Edema Assessment Assessed: [Left: No] [Right: No] [Left: Edema] [Right: :] Calf Left: Right: Point of Measurement: 29 cm From Medial Instep 31.2 cm cm Ankle Left: Right: Point of Measurement: 10 cm From Medial Instep 18.3 cm cm Vascular Assessment Claudication: Claudication Assessment [Left:None] Pulses: Dorsalis Pedis Palpable: [Left:Yes] Posterior Tibial Extremity colors, hair growth, and conditions: Extremity Color: [Left:Normal] Hair Growth on Extremity: [Left:No] Temperature of Extremity: [Left:Warm] Capillary Refill: [Left:< 3 seconds] Toe Nail Assessment Left: Right: Thick: Yes Discolored: Yes Deformed: No Improper Length and Hygiene: Yes Electronic Signature(s) Signed: 11/09/2017 4:34:44 PM By: Renne Crigler Entered By: Renne Crigler on 11/09/2017 14:17:20 Zoe Robinson  ([id number]) -------------------------------------------------------------------------------- Multi Wound Chart Details Patient Name: Zoe Robinson Date of Service: 11/09/2017 1:45 PM Medical Record Number: [id number] Patient Account Number: [id number] Date of Birth/Sex: 10-15-1931 (85 y.o. F) Treating RN: Huel Coventry Primary Care Jaunita Mikels: Gabriel Cirri Other Clinician: Referring Katriana Dortch: Gabriel Cirri Treating Azarius Lambson/Extender: Altamese [place] in Treatment: 9 Vital Signs Height(in): 59 Pulse(bpm): 74 Weight(lbs): 117 Blood Pressure(mmHg): 118/48 Body Mass Index(BMI): 24 Temperature(F): 98.1 Respiratory Rate 16 (breaths/min): Photos: [1:No Photos] [N/A:N/A] Wound Location: [1:Left Lower Leg - Medial] [N/A:N/A] Wounding Event: [1:Trauma] [N/A:N/A] Primary Etiology: [1:Venous Leg Ulcer] [N/A:N/A] Comorbid History: [1:Cataracts, Anemia, Arrhythmia, Hypertension, Rheumatoid Arthritis, Osteoarthritis] [N/A:N/A] Date Acquired: [1:05/02/2017] [N/A:N/A] Weeks of Treatment: [1:9] [  N/A:N/A] Wound Status: [1:Open] [N/A:N/A] Measurements L x W x D [1:2.2x1.4x0.3] [N/A:N/A] (cm) Area (cm) : [1:2.419] [N/A:N/A] Volume (cm) : [1:0.726] [N/A:N/A] % Reduction in Area: [1:68.00%] [N/A:N/A] % Reduction in Volume: [1:4.00%] [N/A:N/A] Classification: [1:Full Thickness Without Exposed Support Structures] [N/A:N/A] Exudate Amount: [1:Large] [N/A:N/A] Exudate Type: [1:Serous] [N/A:N/A] Exudate Color: [1:amber] [N/A:N/A] Wound Margin: [1:Flat and Intact] [N/A:N/A] Granulation Amount: [1:Large (67-100%)] [N/A:N/A] Granulation Quality: [1:Red, Pink] [N/A:N/A] Necrotic Amount: [1:Small (1-33%)] [N/A:N/A] Exposed Structures: [1:Fat Layer (Subcutaneous Tissue) Exposed: Yes Fascia: No Tendon: No Muscle: No Joint: No Bone: No] [N/A:N/A] Epithelialization: [1:Small (1-33%)] [N/A:N/A] Debridement: [1:Debridement - Excisional] [N/A:N/A] Pre-procedure [1:14:31]  [N/A:N/A] Verification/Time Out Taken: JONDA, ALANIS ([id number]) Pain Control: Other N/A N/A Tissue Debrided: Subcutaneous N/A N/A Level: Skin/Subcutaneous Tissue N/A N/A Debridement Area (sq cm): 3.08 N/A N/A Instrument: Curette N/A N/A Bleeding: Minimum N/A N/A Hemostasis Achieved: Pressure N/A N/A Procedural Pain: 2 N/A N/A Post Procedural Pain: 0 N/A N/A Debridement Treatment Procedure was tolerated well N/A N/A Response: Post Debridement 2.2x1.4x0.4 N/A N/A Measurements L x W x D (cm) Post Debridement Volume: 0.968 N/A N/A (cm) Periwound Skin Texture: Scarring: Yes N/A N/A Excoriation: No Induration: No Callus: No Crepitus: No Rash: No Periwound Skin Moisture: Maceration: No N/A N/A Dry/Scaly: No Periwound Skin Color: Atrophie Blanche: No N/A N/A Cyanosis: No Ecchymosis: No Erythema: No Hemosiderin Staining: No Mottled: No Pallor: No Rubor: No Temperature: No Abnormality N/A N/A Tenderness on Palpation: Yes N/A N/A Wound Preparation: Ulcer Cleansing: N/A N/A Rinsed/Irrigated with Saline Topical Anesthetic Applied: Other: lidocaine 4% Procedures Performed: Debridement N/A N/A Treatment Notes Wound #1 (Left, Medial Lower Leg) 1. Cleansed with: Clean wound with Normal Saline 2. Anesthetic Topical Lidocaine 4% cream to wound bed prior to debridement 3. Peri-wound Care: Skin Prep 4. Dressing Applied: Santyl Ointment 5. Secondary Dressing Applied Bordered Foam Dressing Dry Gauze 7. Secured with Patient to wear own compression stockings SIEARRA, AMBERG ([id number]) Electronic Signature(s) Signed: 11/09/2017 5:09:03 PM By: Baltazar Najjar MD Entered By: Baltazar Najjar on 11/09/2017 16:20:04 Zoe Robinson ([id number]) -------------------------------------------------------------------------------- Multi-Disciplinary Care Plan Details Patient Name: Zoe Robinson Date of Service: 11/09/2017 1:45 PM Medical Record Number: [id number] Patient  Account Number: [id number] Date of Birth/Sex: 04/20/1932 (85 y.o. F) Treating RN: Huel Coventry Primary Care Jaz Laningham: Gabriel Cirri Other Clinician: Referring Hollis Tuller: Gabriel Cirri Treating Karren Newland/Extender: Altamese [place] in Treatment: 9 Active Inactive ` Abuse / Safety / Falls / Self Care Management Nursing Diagnoses: Potential for falls Goals: Patient will not experience any injury related to falls Date Initiated: 09/07/2017 Target Resolution Date: 12/03/2017 Goal Status: Active Interventions: Assess fall risk on admission and as needed Notes: ` Orientation to the Wound Care Program Nursing Diagnoses: Knowledge deficit related to the wound healing center program Goals: Patient/caregiver will verbalize understanding of the Wound Healing Center Program Date Initiated: 09/07/2017 Target Resolution Date: 12/03/2017 Goal Status: Active Interventions: Provide education on orientation to the wound center Notes: ` Wound/Skin Impairment Nursing Diagnoses: Impaired tissue integrity Goals: Ulcer/skin breakdown will heal within 14 weeks Date Initiated: 09/07/2017 Target Resolution Date: 12/03/2017 Goal Status: Active Interventions: KERSTYN, CORYELL ([id number]) Assess patient/caregiver ability to obtain necessary supplies Assess patient/caregiver ability to perform ulcer/skin care regimen upon admission and as needed Assess ulceration(s) every visit Notes: Electronic Signature(s) Signed: 11/09/2017 5:11:52 PM By: Elliot Gurney, BSN, RN, CWS, Kim RN, BSN Entered By: Elliot Gurney, BSN, RN, CWS, Kim on 11/09/2017 14:30:00 Zoe Robinson ([id number]) -------------------------------------------------------------------------------- Pain Assessment Details Patient Name: Zoe Robinson Date of Service: 11/09/2017 1:45 PM Medical Record Number: [id number] Patient  Account Number: [id number] Date of Birth/Sex: May 12, 1932 (84 y.o. F) Treating RN: Renne Crigler Primary Care Lisbet Busker:  Gabriel Cirri Other Clinician: Referring Chinelo Benn: Gabriel Cirri Treating Skylarr Liz/Extender: Altamese [place] in Treatment: 9 Active Problems Location of Pain Severity and Description of Pain Patient Has Paino No Site Locations Pain Management and Medication Current Pain Management: Electronic Signature(s) Signed: 11/09/2017 4:34:44 PM By: Renne Crigler Entered By: Renne Crigler on 11/09/2017 14:10:20 Zoe Robinson ([id number]) -------------------------------------------------------------------------------- Patient/Caregiver Education Details Patient Name: Zoe Robinson Date of Service: 11/09/2017 1:45 PM Medical Record Number: [id number] Patient Account Number: [id number] Date of Birth/Gender: [birth year] (85 y.o. F) Treating RN: Curtis Sites Primary Care Physician: Gabriel Cirri Other Clinician: Referring Physician: Gabriel Cirri Treating Physician/Extender: Altamese [place] in Treatment: 9 Education Assessment Education Provided To: Patient and Caregiver Education Topics Provided Venous: Handouts: Other: leg elevation Methods: Explain/Verbal Responses: State content correctly Electronic Signature(s) Signed: 11/09/2017 4:39:21 PM By: Curtis Sites Entered By: Curtis Sites on 11/09/2017 14:43:42 Zoe Robinson ([id number]) -------------------------------------------------------------------------------- Wound Assessment Details Patient Name: Zoe Robinson Date of Service: 11/09/2017 1:45 PM Medical Record Number: [id number] Patient Account Number: [id number] Date of Birth/Sex: [birth year] (85 y.o. F) Treating RN: Renne Crigler Primary Care Vincentina Sollers: Gabriel Cirri Other Clinician: Referring Carle Dargan: Gabriel Cirri Treating Shemeka Wardle/Extender: Altamese [place] in Treatment: 9 Wound Status Wound Number: 1 Primary Venous Leg Ulcer Etiology: Wound Location: Left Lower Leg - Medial Wound Open Wounding Event:  Trauma Status: Date Acquired: 05/02/2017 Comorbid Cataracts, Anemia, Arrhythmia, Hypertension, Weeks Of Treatment: 9 History: Rheumatoid Arthritis, Osteoarthritis Clustered Wound: No Photos Photo Uploaded By: Renne Crigler on 11/09/2017 16:32:10 Wound Measurements Length: (cm) 2.2 Width: (cm) 1.4 Depth: (cm) 0.3 Area: (cm) 2.419 Volume: (cm) 0.726 % Reduction in Area: 68% % Reduction in Volume: 4% Epithelialization: Small (1-33%) Tunneling: No Undermining: No Wound Description Full Thickness Without Exposed Support Classification: Structures Wound Margin: Flat and Intact Exudate Large Amount: Exudate Type: Serous Exudate Color: amber Foul Odor After Cleansing: No Slough/Fibrino Yes Wound Bed Granulation Amount: Large (67-100%) Exposed Structure Granulation Quality: Red, Pink Fascia Exposed: No Necrotic Amount: Small (1-33%) Fat Layer (Subcutaneous Tissue) Exposed: Yes Necrotic Quality: Adherent Slough Tendon Exposed: No Muscle Exposed: No Joint Exposed: No Bone Exposed: No Rembert, Tiffane ([id number]) Periwound Skin Texture Texture Color No Abnormalities Noted: No No Abnormalities Noted: No Callus: No Atrophie Blanche: No Crepitus: No Cyanosis: No Excoriation: No Ecchymosis: No Induration: No Erythema: No Rash: No Hemosiderin Staining: No Scarring: Yes Mottled: No Pallor: No Moisture Rubor: No No Abnormalities Noted: No Dry / Scaly: No Temperature / Pain Maceration: No Temperature: No Abnormality Tenderness on Palpation: Yes Wound Preparation Ulcer Cleansing: Rinsed/Irrigated with Saline Topical Anesthetic Applied: Other: lidocaine 4%, Treatment Notes Wound #1 (Left, Medial Lower Leg) 1. Cleansed with: Clean wound with Normal Saline 2. Anesthetic Topical Lidocaine 4% cream to wound bed prior to debridement 3. Peri-wound Care: Skin Prep 4. Dressing Applied: Santyl Ointment 5. Secondary Dressing Applied Bordered Foam  Dressing Dry Gauze 7. Secured with Patient to wear own compression stockings Electronic Signature(s) Signed: 11/09/2017 4:34:44 PM By: Renne Crigler Entered By: Renne Crigler on 11/09/2017 14:15:28 Zoe Robinson ([id number]) -------------------------------------------------------------------------------- Vitals Details Patient Name: Zoe Robinson Date of Service: 11/09/2017 1:45 PM Medical Record Number: [id number] Patient Account Number: [id number] Date of Birth/Sex: 1932/07/07 (85 y.o. F) Treating RN: Renne Crigler Primary Care Trivia Heffelfinger: Gabriel Cirri Other Clinician: Referring Maccoy Haubner: Gabriel Cirri Treating Talton Delpriore/Extender: Altamese [place] in Treatment: 9 Vital Signs Time Taken: 14:10 Temperature (F): 98.1 Height (in): 59  Pulse (bpm): 74 Weight (lbs): 117 Respiratory Rate (breaths/min): 16 Body Mass Index (BMI): 23.6 Blood Pressure (mmHg): 118/48 Reference Range: 80 - 120 mg / dl Electronic Signature(s) Signed: 11/09/2017 4:34:44 PM By: Renne Crigler Entered By: Renne Crigler on 11/09/2017 14:10:42

## 2017-11-16 ENCOUNTER — Encounter: Payer: Medicare Other | Admitting: Internal Medicine

## 2017-11-16 DIAGNOSIS — E11622 Type 2 diabetes mellitus with other skin ulcer: Secondary | ICD-10-CM | POA: Diagnosis not present

## 2017-11-20 NOTE — Progress Notes (Signed)
Zoe Robinson, Zoe Robinson ([id number]) Visit Report for 11/16/2017 HPI Details Patient Name: Zoe Robinson, Zoe Robinson Date of Service: 11/16/2017 1:00 PM Medical Record Number: [id number] Patient Account Number: [id number] Date of Birth/Sex: 1931/08/02 (84 y.o. F) Treating RN: Huel Coventry Primary Care Provider: Gabriel Cirri Other Clinician: Referring Provider: Gabriel Cirri Treating Provider/Extender: Altamese [place] in Treatment: 10 History of Present Illness HPI Description: 09/07/17; this is an 84 year old woman who arrives accompanied by her daughter. She has a history of severe rheumatoid arthritis followed by Dr. Corliss Skains in [place]. By review of care everywhere it appears that she had cellulitis of her left lower leg in September 2018. This may have been caused by a dog scratch. She was followed and her primary care office at [facility] affiliated group. She received antibiotics, Silvadene cream as well as Una boots. This was followed through late October and the wound was healed out on 10/31. The patient and her daughters state that the current wound started with a scissor injury to the medial left calf before the wound was actually healed out in other words this is a different wound area. The original wound was more medial on the left anterior tibial area. Since then they've been using Silvadene cream and apparently she changed doctors who recommended Polysporin and they've been referred here for the injury on her left medial calf. She does not have a history of rheumatoid-related skin issues/vasculitis The patient is a type II diabetic with most recent hemoglobin A1c of 6. She has advanced seropositive rheumatoid arthritis on prednisone 10 mg alternating with 5 and methotrexate. I see she is also on xarelto while Im not really sure why. She has significant osteoporosis, history of hypercalcemia. ABI in this clinic is 1.08. She was a former smoker 09/14/17; patient's wound slightly smaller  today however still covered in a very thick adherent necrotic surface. We have been using Santyl under compression. We finally got home health to start yesterday "advanced" 09/21/17; not too much change in the area here. We have been using Santyl under compression and I'll continue this after debridement today. Consider Iodoflex if we don't get a better looking wound surface 09/22/17-she presents today as an urgent follow-up secondary to uncontrolled bleeding overnight. She has saturated through the 3-layer compression. Upon dressing removal there is significant clot formation, she continues to bleed from the most proximal aspect of the ulcer. 4 silver nitrate sticks were used to try to obtain hemostasis, along with 10 minutes of pressure dressing. The wound continues to bleed although not as briskly; this appears to be more of a venous bleed and arterial. She is hemodynamically stable upon presentation to the clinic SBP 130's, HR 80's with no complaints of dizziness, lightheadedness. We are unable to obtain any hemostatic agents from the emergency department. Since she has bled through the pressure dressing and continues to bleed despite all efforts available in the clinic she has been sent to the emergency department for evaluation and treatment 09/28/17; the patient has not had an easy time since the last time I saw her. She continues to have nonviable surface over the wound perhaps slightly better using Santyl. She has on xarelto which no doubt contributes to the bleeding after debridement. I'm going to change her to Iodoflex today. She is having a lot of discomfort with this we gave her tramadol which is reasonable 10/05/17;patient continues to have nonviable surface over a very difficult wound.we also note that her propensity to bleed. We have been using Iodoflex. previously minimal response previously  to [facility]. She tells me that her rheumatologist is made adjustments to both her prednisone and  methotrexate in attempt to help heal this difficult area. One would wonder if this is all venous insufficiency and whether the rheumatoid arthritis itself could be contributing to an inflammatory ulcer/rheumatoid vasculitis 10/12/17 She is here in follow up evaluation for a left lower extremity ulcer. There is no improvement. We will transition to santyl. 10/19/17; she has transitioned back to santyl and has been changing this every day. There has been some improvement although still requiring mechanical debridement 10/25/17; still using Santyl on the area on the left lower extremity. There is improvement for the first time. Recently no debridement was felt to be necessary.she did not respond well to Iodoflex. Request for fThera skin was denied by her insurance Zoe Robinson, Zoe Robinson ([id number]) 11/02/17; still using Santyl. She did not respond well to Iodoflex. We are getting a much better surface that this deep wound. Still requiring debridement.there is no advanced treatment option through her primary insurance [[employer]] 11/09/17; we are still using Santyl. Better looking surface still requiring mechanical debridement. There is on the left medial lower extremity.this initially started at the time of cellulitis in the left leg which may have been caused by a dog scratch. Had a lot of trouble getting in adequate surface on this wound. We did use Iodoflex for a period of time. we have not been able to get a proper surface to consider an alternative dressing 11/16/17; we finally have this wound down to a healthy granulated surface. We can graduate from [facility] to silver collagen. I have again reviewed the history of this apparently the patient had a scratch injury in the lateral aspect of her leg. In the course of wrapping this this was apparently a scissor injury at her primary's office. I'm he got this wrong and the original history Electronic Signature(s) Signed: 11/16/2017 5:40:08 PM By: Baltazar Najjar  MD Entered By: Baltazar Najjar on 11/16/2017 14:21:12 Zoe Robinson ([id number]) -------------------------------------------------------------------------------- Physical Exam Details Patient Name: Zoe Robinson Date of Service: 11/16/2017 1:00 PM Medical Record Number: [id number] Patient Account Number: [id number] Date of Birth/Sex: 1932-06-16 (85 y.o. F) Treating RN: Huel Coventry Primary Care Provider: Gabriel Cirri Other Clinician: Referring Provider: Gabriel Cirri Treating Provider/Extender: Maxwell Caul Weeks in Treatment: 10 Constitutional Sitting or standing Blood Pressure is within target range for patient.. Pulse regular and within target range for patient.Marland Kitchen Respirations regular, non-labored and within target range.. Temperature is normal and within the target range for the patient.Marland Kitchen appears in no distress. Eyes Conjunctivae clear. No discharge. Respiratory Respiratory effort is easy and symmetric bilaterally. Rate is normal at rest and on room air.. Cardiovascular Pedal pulses palpable. Lymphatic nonpalpable no popliteal or inguinal area. Psychiatric No evidence of depression, anxiety, or agitation. Calm, cooperative, and communicative. Appropriate interactions and affect.. Notes wound exam; the patient had a much better looking surface today. No debridement was required. No evidence of surrounding infection Electronic Signature(s) Signed: 11/16/2017 5:40:08 PM By: Baltazar Najjar MD Entered By: Baltazar Najjar on 11/16/2017 14:22:34 Zoe Robinson ([id number]) -------------------------------------------------------------------------------- Physician Orders Details Patient Name: Zoe Robinson Date of Service: 11/16/2017 1:00 PM Medical Record Number: [id number] Patient Account Number: [id number] Date of Birth/Sex: 08/21/1931 (85 y.o. F) Treating RN: Huel Coventry Primary Care Provider: Gabriel Cirri Other Clinician: Referring Provider: Gabriel Cirri Treating Provider/Extender: Altamese [place] in Treatment: 10 Verbal / Phone Orders: No Diagnosis Coding Wound Cleansing Wound #1 Left,Medial Lower Leg o Cleanse wound with mild soap  and water Anesthetic (add to Medication List) Wound #1 Left,Medial Lower Leg o Topical Lidocaine 4% cream applied to wound bed prior to debridement (In Clinic Only). Skin Barriers/Peri-Wound Care Wound #1 Left,Medial Lower Leg o Barrier cream o Moisturizing lotion Primary Wound Dressing Wound #1 Left,Medial Lower Leg o Silver Collagen - with Hydrogel Secondary Dressing Wound #1 Left,Medial Lower Leg o Boardered Foam Dressing Follow-up Appointments Wound #1 Left,Medial Lower Leg o Return Appointment in 1 week. Edema Control Wound #1 Left,Medial Lower Leg o Patient to wear own compression stockings o Elevate legs to the level of the heart and pump ankles as often as possible Home Health Wound #1 Left,Medial Lower Leg o Continue Home Health Visits o Home Health Nurse may visit PRN to address patientos wound care needs. o FACE TO FACE ENCOUNTER: MEDICARE and MEDICAID PATIENTS: I certify that this patient is under my care and that I had a face-to-face encounter that meets the physician face-to-face encounter requirements with this patient on this date. The encounter with the patient was in whole or in part for the following MEDICAL CONDITION: (primary reason for Home Healthcare) MEDICAL NECESSITY: I certify, that based on my findings, NURSING services are a medically necessary home health service. HOME BOUND STATUS: I certify that my clinical findings support that this patient is homebound (i.e., Due to illness or injury, pt requires aid of supportive devices such as crutches, cane, wheelchairs, walkers, the use of special transportation or the assistance of another person to leave their place of residence. There is a normal inability to leave the home VERTIS, BAUDER ([id number]) and doing so requires considerable and taxing effort. Other absences are for medical reasons / religious services and are infrequent or of short duration when for other reasons). o If current dressing causes regression in wound condition, may D/C ordered dressing product/s and apply Normal Saline Moist Dressing daily until next Wound Healing Center / Other MD appointment. Notify Wound Healing Center of regression in wound condition at [phone]. o Please direct any NON-WOUND related issues/requests for orders to patient's Primary Care Physician Electronic Signature(s) Signed: 11/16/2017 5:40:08 PM By: Baltazar Najjar MD Signed: 11/16/2017 8:51:29 PM By: Elliot Gurney, BSN, RN, CWS, Kim RN, BSN Entered By: Elliot Gurney, BSN, RN, CWS, Kim on 11/16/2017 13:42:28 Zoe Robinson ([id number]) -------------------------------------------------------------------------------- Problem List Details Patient Name: Zoe Robinson Date of Service: 11/16/2017 1:00 PM Medical Record Number: [id number] Patient Account Number: [id number] Date of Birth/Sex: September 07, 1931 (85 y.o. 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 09/07/2017 Yes I87.312 Chronic venous hypertension (idiopathic) with ulcer of left 09/07/2017 Yes lower extremity M05.80 Other rheumatoid arthritis with rheumatoid factor of 09/07/2017 Yes unspecified site Inactive Problems Resolved Problems Electronic Signature(s) Signed: 11/16/2017 5:40:08 PM By: Baltazar Najjar MD Entered By: Baltazar Najjar on 11/16/2017 14:19:09 Zoe Robinson  ([id number]) -------------------------------------------------------------------------------- Progress Note Details Patient Name: Zoe Robinson Date of Service: 11/16/2017 1:00 PM Medical Record Number: [id number] Patient Account Number: [id number] Date of Birth/Sex: 06/27/1932 (85 y.o. F) Treating RN: Huel Coventry Primary Care Provider: Gabriel Cirri Other Clinician: Referring Provider: Gabriel Cirri Treating Provider/Extender: Altamese [place] in Treatment: 10 Subjective History of Present Illness (HPI) 09/07/17; this is an 84 year old woman who arrives accompanied by her daughter. She has a history of severe rheumatoid arthritis followed  by Dr. Corliss Skains in [place]. By review of care everywhere it appears that she had cellulitis of her left lower leg in September 2018. This may have been caused by a dog scratch. She was followed and her primary care office at [facility] affiliated group. She received antibiotics, Silvadene cream as well as Una boots. This was followed through late October and the wound was healed out on 10/31. The patient and her daughters state that the current wound started with a scissor injury to the medial left calf before the wound was actually healed out in other words this is a different wound area. The original wound was more medial on the left anterior tibial area. Since then they've been using Silvadene cream and apparently she changed doctors who recommended Polysporin and they've been referred here for the injury on her left medial calf. She does not have a history of rheumatoid-related skin issues/vasculitis The patient is a type II diabetic with most recent hemoglobin A1c of 6. She has advanced seropositive rheumatoid arthritis on prednisone 10 mg alternating with 5 and methotrexate. I see she is also on xarelto while Im not really sure why. She has significant osteoporosis, history of hypercalcemia. ABI in this clinic is 1.08. She was a former  smoker 09/14/17; patient's wound slightly smaller today however still covered in a very thick adherent necrotic surface. We have been using Santyl under compression. We finally got home health to start yesterday "advanced" 09/21/17; not too much change in the area here. We have been using Santyl under compression and I'll continue this after debridement today. Consider Iodoflex if we don't get a better looking wound surface 09/22/17-she presents today as an urgent follow-up secondary to uncontrolled bleeding overnight. She has saturated through the 3-layer compression. Upon dressing removal there is significant clot formation, she continues to bleed from the most proximal aspect of the ulcer. 4 silver nitrate sticks were used to try to obtain hemostasis, along with 10 minutes of pressure dressing. The wound continues to bleed although not as briskly; this appears to be more of a venous bleed and arterial. She is hemodynamically stable upon presentation to the clinic SBP 130's, HR 80's with no complaints of dizziness, lightheadedness. We are unable to obtain any hemostatic agents from the emergency department. Since she has bled through the pressure dressing and continues to bleed despite all efforts available in the clinic she has been sent to the emergency department for evaluation and treatment 09/28/17; the patient has not had an easy time since the last time I saw her. She continues to have nonviable surface over the wound perhaps slightly better using Santyl. She has on xarelto which no doubt contributes to the bleeding after debridement. I'm going to change her to Iodoflex today. She is having a lot of discomfort with this we gave her tramadol which is reasonable 10/05/17;patient continues to have nonviable surface over a very difficult wound.we also note that her propensity to bleed. We have been using Iodoflex. previously minimal response previously to [employer]. She tells me that her rheumatologist  is made adjustments to both her prednisone and methotrexate in attempt to help heal this difficult area. One would wonder if this is all venous insufficiency and whether the rheumatoid arthritis itself could be contributing to an inflammatory ulcer/rheumatoid vasculitis 10/12/17 She is here in follow up evaluation for a left lower extremity ulcer. There is no improvement. We will transition to santyl. 10/19/17; she has transitioned back to santyl and has been changing this every  day. There has been some improvement although still requiring mechanical debridement 10/25/17; still using Santyl on the area on the left lower extremity. There is improvement for the first time. Recently no debridement was felt to be necessary.she did not respond well to Iodoflex. Request for fThera skin was denied by her insurance 11/02/17; still using Santyl. She did not respond well to Iodoflex. We are getting a much better surface that this deep wound. Still requiring debridement.there is no advanced treatment option through her primary insurance Zoe Robinson, Zoe Robinson ([id number]) 11/09/17; we are still using Santyl. Better looking surface still requiring mechanical debridement. There is on the left medial lower extremity.this initially started at the time of cellulitis in the left leg which may have been caused by a dog scratch. Had a lot of trouble getting in adequate surface on this wound. We did use Iodoflex for a period of time. we have not been able to get a proper surface to consider an alternative dressing 11/16/17; we finally have this wound down to a healthy granulated surface. We can graduate from [facility] to silver collagen. I have again reviewed the history of this apparently the patient had a scratch injury in the lateral aspect of her leg. In the course of wrapping this this was apparently a scissor injury at her primary's office. I'm he got this wrong and the  original history Objective Constitutional Sitting or standing Blood Pressure is within target range for patient.. Pulse regular and within target range for patient.Marland Kitchen Respirations regular, non-labored and within target range.. Temperature is normal and within the target range for the patient.Marland Kitchen appears in no distress. Vitals Time Taken: 1:00 PM, Height: 59 in, Weight: 117 lbs, BMI: 23.6, Temperature: 97.7 F, Pulse: 86 bpm, Respiratory Rate: 18 breaths/min, Blood Pressure: 114/44 mmHg. Eyes Conjunctivae clear. No discharge. Respiratory Respiratory effort is easy and symmetric bilaterally. Rate is normal at rest and on room air.. Cardiovascular Pedal pulses palpable. Lymphatic nonpalpable no popliteal or inguinal area. Psychiatric No evidence of depression, anxiety, or agitation. Calm, cooperative, and communicative. Appropriate interactions and affect.. General Notes: wound exam; the patient had a much better looking surface today. No debridement was required. No evidence of surrounding infection Integumentary (Hair, Skin) Wound #1 status is Open. Original cause of wound was Trauma. The wound is located on the Left,Medial Lower Leg. The wound measures 1.5cm length x 1.2cm width x 0.3cm depth; 1.414cm^2 area and 0.424cm^3 volume. There is Fat Layer (Subcutaneous Tissue) Exposed exposed. There is no tunneling or undermining noted. There is a large amount of serous drainage noted. The wound margin is flat and intact. There is large (67-100%) red, pink granulation within the wound bed. There is a small (1-33%) amount of necrotic tissue within the wound bed including Adherent Slough. The periwound skin appearance exhibited: Scarring, Maceration. The periwound skin appearance did not exhibit: Callus, Crepitus, Excoriation, Induration, Rash, Dry/Scaly, Atrophie Blanche, Cyanosis, Ecchymosis, Hemosiderin Staining, Mottled, Pallor, Rubor, Erythema. Periwound temperature was noted as No  Abnormality. The periwound has tenderness on palpation. Zoe Robinson, Zoe Robinson ([id number]) Assessment Active Problems ICD-10 [phone] - 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 muscle I87.312 - Chronic venous hypertension (idiopathic) with ulcer of left lower extremity M05.80 - Other rheumatoid arthritis with rheumatoid factor of unspecified site Plan Wound Cleansing: Wound #1 Left,Medial Lower Leg: Cleanse wound with mild soap and water Anesthetic (add to Medication List): Wound #1 Left,Medial Lower Leg: Topical Lidocaine 4% cream applied to wound bed prior to debridement (In Clinic Only). Skin  Barriers/Peri-Wound Care: Wound #1 Left,Medial Lower Leg: Barrier cream Moisturizing lotion Primary Wound Dressing: Wound #1 Left,Medial Lower Leg: Silver Collagen - with Hydrogel Secondary Dressing: Wound #1 Left,Medial Lower Leg: Boardered Foam Dressing Follow-up Appointments: Wound #1 Left,Medial Lower Leg: Return Appointment in 1 week. Edema Control: Wound #1 Left,Medial Lower Leg: Patient to wear own compression stockings Elevate legs to the level of the heart and pump ankles as often as possible Home Health: Wound #1 Left,Medial Lower Leg: Continue Home Health Visits Home Health Nurse may visit PRN to address patient s wound care needs. FACE TO FACE ENCOUNTER: MEDICARE and MEDICAID PATIENTS: I certify that this patient is under my care and that I had a face-to-face encounter that meets the physician face-to-face encounter requirements with this patient on this date. The encounter with the patient was in whole or in part for the following MEDICAL CONDITION: (primary reason for Home Healthcare) MEDICAL NECESSITY: I certify, that based on my findings, NURSING services are a medically necessary home health service. HOME BOUND STATUS: I certify that my clinical findings support that this patient is homebound (i.e., Due to illness or injury, pt requires aid of supportive  devices such as crutches, cane, wheelchairs, walkers, the use of special transportation or the assistance of another person to leave their place of residence. There is a normal inability to leave the home and doing so requires considerable and taxing effort. Other absences are for medical reasons / religious services and are infrequent or of short duration when for other reasons). If current dressing causes regression in wound condition, may D/C ordered dressing product/s and apply Normal Saline Moist Dressing daily until next Wound Healing Center / Other MD appointment. Notify Wound Healing Center of regression in wound condition at [phone]. Please direct any NON-WOUND related issues/requests for orders to patient's Primary Care Physician ELLIOT, MURREY ([id number]) #1 left medial lower leg to change the dressing to Silver collagen from debriding agents/Santyl.border foam #2 apparently reviewing the history this original injury was caused by scissor injury at time for care for a wound on the lateral calf that healed Electronic Signature(s) Signed: 11/16/2017 5:40:08 PM By: Baltazar Najjar MD Entered By: Baltazar Najjar on 11/16/2017 14:23:32 Zoe Robinson ([id number]) -------------------------------------------------------------------------------- SuperBill Details Patient Name: Zoe Robinson Date of Service: 11/16/2017 Medical Record Number: [id number] Patient Account Number: [id number] Date of Birth/Sex: [birth year] (85 y.o. F) Treating RN: Huel Coventry Primary Care Provider: Gabriel Cirri Other Clinician: Referring Provider: Gabriel Cirri Treating Provider/Extender: Altamese [place] in Treatment: 10 Diagnosis Coding ICD-10 Codes Code Description [phone] 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 muscle I87.312 Chronic venous hypertension (idiopathic) with ulcer of left lower extremity M05.80 Other rheumatoid arthritis with rheumatoid factor of  unspecified site Facility Procedures CPT4 Code: [id number] Description: [phone] - WOUND CARE VISIT-LEV 2 EST PT Modifier: Quantity: 1 Physician Procedures CPT4 Code: [id number] Description: 99213 - WC PHYS LEVEL 3 - EST PT ICD-10 Diagnosis Description L97.223 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 musc I87.312 Chronic venous hypertension (idiopathic) with ulcer of left l Modifier: le ower extremity Quantity: 1 Electronic Signature(s) Signed: 11/16/2017 5:40:08 PM By: Baltazar Najjar MD Entered By: Baltazar Najjar on 11/16/2017 14:23:53

## 2017-11-23 ENCOUNTER — Encounter: Payer: Medicare Other | Attending: Internal Medicine | Admitting: Internal Medicine

## 2017-11-23 DIAGNOSIS — I87312 Chronic venous hypertension (idiopathic) with ulcer of left lower extremity: Secondary | ICD-10-CM | POA: Insufficient documentation

## 2017-11-23 DIAGNOSIS — Z87891 Personal history of nicotine dependence: Secondary | ICD-10-CM | POA: Insufficient documentation

## 2017-11-23 DIAGNOSIS — L03116 Cellulitis of left lower limb: Secondary | ICD-10-CM | POA: Insufficient documentation

## 2017-11-23 DIAGNOSIS — E11622 Type 2 diabetes mellitus with other skin ulcer: Secondary | ICD-10-CM | POA: Insufficient documentation

## 2017-11-23 DIAGNOSIS — M069 Rheumatoid arthritis, unspecified: Secondary | ICD-10-CM | POA: Insufficient documentation

## 2017-11-23 DIAGNOSIS — L97223 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 muscle: Secondary | ICD-10-CM | POA: Insufficient documentation

## 2017-11-23 DIAGNOSIS — E1151 Type 2 diabetes mellitus with diabetic peripheral angiopathy without gangrene: Secondary | ICD-10-CM | POA: Diagnosis not present

## 2017-11-23 NOTE — Progress Notes (Signed)
JACIE, VANWERT ([id number]) Visit Report for 11/16/2017 Arrival Information Details Patient Name: Zoe Robinson, Zoe Robinson Date of Service: 11/16/2017 1:00 PM Medical Record Number: [id number] Patient Account Number: [id number] Date of Birth/Sex: April 30, 1932 (84 y.o. F) Treating RN: Curtis Sites Primary Care Tika Hannis: Gabriel Cirri Other Clinician: Referring Javonte Elenes: Gabriel Cirri Treating Anabia Weatherwax/Extender: Altamese [place] in Treatment: 10 Visit Information History Since Last Visit Added or deleted any medications: No Patient Arrived: Ambulatory Any new allergies or adverse reactions: No Arrival Time: 12:58 Had a fall or experienced change in No Accompanied By: dtr activities of daily living that may affect Transfer Assistance: None risk of falls: Patient Identification Verified: Yes Signs or symptoms of abuse/neglect since last visito No Secondary Verification Process Yes Hospitalized since last visit: No Completed: Implantable device outside of the clinic excluding No Patient Requires Transmission-Based No cellular tissue based products placed in the center Precautions: since last visit: Patient Has Alerts: Yes Has Dressing in Place as Prescribed: Yes Patient Alerts: Patient on Blood Has Compression in Place as Prescribed: Yes Thinner Pain Present Now: No xarelto Electronic Signature(s) Signed: 11/16/2017 5:00:43 PM By: Curtis Sites Entered By: Curtis Sites on 11/16/2017 12:59:50 Zoe Robinson ([id number]) -------------------------------------------------------------------------------- Clinic Level of Care Assessment Details Patient Name: Zoe Robinson Date of Service: 11/16/2017 1:00 PM Medical Record Number: [id number] Patient Account Number: [id number] Date of Birth/Sex: May 21, 1932 (84 y.o. F) Treating RN: Huel Coventry Primary Care Michail Boyte: Gabriel Cirri Other Clinician: Referring Saide Lanuza: Gabriel Cirri Treating Shajuana Mclucas/Extender: Altamese [place] in Treatment: 10 Clinic Level of Care Assessment Items TOOL 4 Quantity Score []  - Use when only an EandM is performed on FOLLOW-UP visit 0 ASSESSMENTS - Nursing Assessment / Reassessment []  - Reassessment of Co-morbidities (includes updates in patient status) 0 X- 1 5 Reassessment of Adherence to Treatment Plan ASSESSMENTS - Wound and Skin Assessment / Reassessment X - Simple Wound Assessment / Reassessment - one wound 1 5 []  - 0 Complex Wound Assessment / Reassessment - multiple wounds []  - 0 Dermatologic / Skin Assessment (not related to wound area) ASSESSMENTS - Focused Assessment []  - Circumferential Edema Measurements - multi extremities 0 []  - 0 Nutritional Assessment / Counseling / Intervention []  - 0 Lower Extremity Assessment (monofilament, tuning fork, pulses) []  - 0 Peripheral Arterial Disease Assessment (using hand held doppler) ASSESSMENTS - Ostomy and/or Continence Assessment and Care []  - Incontinence Assessment and Management 0 []  - 0 Ostomy Care Assessment and Management (repouching, etc.) PROCESS - Coordination of Care X - Simple Patient / Family Education for ongoing care 1 15 []  - 0 Complex (extensive) Patient / Family Education for ongoing care []  - 0 Staff obtains Chiropractor, Records, Test Results / Process Orders []  - 0 Staff telephones HHA, Nursing Homes / Clarify orders / etc []  - 0 Routine Transfer to another Facility (non-emergent condition) []  - 0 Routine Hospital Admission (non-emergent condition) []  - 0 New Admissions / Manufacturing engineer / Ordering NPWT, Apligraf, etc. []  - 0 Emergency Hospital Admission (emergent condition) X- 1 10 Simple Discharge Coordination KU., REPINSKI ([id number]) []  - 0 Complex (extensive) Discharge Coordination PROCESS - Special Needs []  - Pediatric / Minor Patient Management 0 []  - 0 Isolation Patient Management []  - 0 Hearing / Language / Visual special needs []  - 0 Assessment of  Community assistance (transportation, D/C planning, etc.) []  - 0 Additional assistance / Altered mentation []  - 0 Support Surface(s) Assessment (bed, cushion, seat, etc.) INTERVENTIONS - Wound Cleansing / Measurement X - Simple Wound Cleansing - one  wound 1 5 []  - 0 Complex Wound Cleansing - multiple wounds X- 1 5 Wound Imaging (photographs - any number of wounds) []  - 0 Wound Tracing (instead of photographs) X- 1 5 Simple Wound Measurement - one wound []  - 0 Complex Wound Measurement - multiple wounds INTERVENTIONS - Wound Dressings []  - Small Wound Dressing one or multiple wounds 0 X- 1 15 Medium Wound Dressing one or multiple wounds []  - 0 Large Wound Dressing one or multiple wounds []  - 0 Application of Medications - topical []  - 0 Application of Medications - injection INTERVENTIONS - Miscellaneous []  - External ear exam 0 []  - 0 Specimen Collection (cultures, biopsies, blood, body fluids, etc.) []  - 0 Specimen(s) / Culture(s) sent or taken to Lab for analysis []  - 0 Patient Transfer (multiple staff / / Similar devices) []  - 0 Simple Staple / Suture removal (25 or less) []  - 0 Complex Staple / Suture removal (26 or more) []  - 0 Hypo / Hyperglycemic Management (close monitor of Blood Glucose) []  - 0 Ankle / Brachial Index (ABI) - do not check if billed separately X- 1 5 Vital Signs Bille, Aevah ( ) Has the patient been seen at the hospital within the last three years: Yes Total Score: 70 Level Of Care: New/Established - Level 2 Electronic Signature(s) Signed: 11/16/2017 8:51:29 PM By: , BSN, RN, CWS, Kim RN, BSN Entered By: , BSN, RN, CWS, Kim on 11/16/2017 13:43:21 ( ) -------------------------------------------------------------------------------- Encounter Discharge Information Details Patient Name: Date of Service: 11/16/2017 1:00 PM Medical Record Number: Patient  Account Number: Nurse, adult Date of Birth/Sex: [birth year] (84 y.o. F) Treating RN: Primary Care Bernardina Cacho: Other Clinician: Referring Pearline Yerby: [phone] Treating Deola Rewis/Extender: 11/18/2017 in Treatment: 10 Encounter Discharge Information Items Discharge Pain Level: 0 Discharge Condition: Stable Ambulatory Status: Ambulatory Discharge Destination: Home Transportation: Private Auto Accompanied By: dtr Schedule Follow-up Appointment: Yes Medication Reconciliation completed and No provided to Patient/Care Eimi Viney: Provided on Clinical Summary of Care: 11/16/2017 Form Type Recipient Paper Patient JK Electronic Signature(s) Signed: 11/21/2017 10:08:32 AM By: 11/18/2017 Entered By: Zoe Robinson on 11/16/2017 13:54:12 Zoe Robinson (11/18/2017) -------------------------------------------------------------------------------- Lower Extremity Assessment Details Patient Name: [id number] Date of Service: 11/16/2017 1:00 PM Medical Record Number: 05/16/[id number] Patient Account Number: [id number] Date of Birth/Sex: [birth year] (85 y.o. F) Treating RN: Gabriel Cirri Primary Care Veena Sturgess: Altamese [place] Other Clinician: Referring Keyan Folson: 11/18/2017 Treating Ren Aspinall/Extender: 11/23/2017 in Treatment: 10 Edema Assessment Assessed: [Left: No] [Right: No] [Left: Edema] [Right: :] Calf Left: Right: Point of Measurement: 29 cm From Medial Instep 31 cm cm Ankle Left: Right: Point of Measurement: 10 cm From Medial Instep 18.2 cm cm Vascular Assessment Pulses: Dorsalis Pedis Palpable: [Left:Yes] Posterior Tibial Extremity colors, hair growth, and conditions: Extremity Color: [Left:Mottled] Hair Growth on Extremity: [Left:No] Temperature of Extremity: [Left:Warm] Capillary Refill: [Left:< 3 seconds] Toe Nail Assessment Left: Right: Thick: Yes Discolored: Yes Deformed: Yes Improper Length and Hygiene:  Yes Electronic Signature(s) Signed: 11/16/2017 5:00:43 PM By: Gwenlyn Perking Entered By: 11/18/2017 on 11/16/2017 13:07:20 [id number] (Zoe Robinson) -------------------------------------------------------------------------------- Multi Wound Chart Details Patient Name: 11/18/2017 Date of Service: 11/16/2017 1:00 PM Medical Record Number: [id number] Patient Account Number: 05/16/1932 Date of Birth/Sex: May 24, 1932 (85 y.o. F) Treating RN: Gabriel Cirri Primary Care Abir Craine: Gabriel Cirri Other Clinician: Referring Anagabriela Jokerst: Altamese [place] Treating Jennye Runquist/Extender: 11/18/2017 Weeks in Treatment: 10 Vital Signs Height(in): 59 Pulse(bpm): 86 Weight(lbs): 117 Blood  Pressure(mmHg): 114/44 Body Mass Index(BMI): 24 Temperature(F): 97.7 Respiratory Rate 18 (breaths/min): Photos: [N/A:N/A] Wound Location: Left Lower Leg - Medial N/A N/A Wounding Event: Trauma N/A N/A Primary Etiology: Venous Leg Ulcer N/A N/A Comorbid History: Cataracts, Anemia, N/A N/A Arrhythmia, Hypertension, Rheumatoid Arthritis, Osteoarthritis Date Acquired: 05/02/2017 N/A N/A Weeks of Treatment: 10 N/A N/A Wound Status: Open N/A N/A Measurements L x W x D 1.5x1.2x0.3 N/A N/A (cm) Area (cm) : 1.414 N/A N/A Volume (cm) : 0.424 N/A N/A % Reduction in Area: 81.30% N/A N/A % Reduction in Volume: 43.90% N/A N/A Classification: Full Thickness Without N/A N/A Exposed Support Structures Exudate Amount: Large N/A N/A Exudate Type: Serous N/A N/A Exudate Color: amber N/A N/A Wound Margin: Flat and Intact N/A N/A Granulation Amount: Large (67-100%) N/A N/A Granulation Quality: Red, Pink N/A N/A Necrotic Amount: Small (1-33%) N/A N/A Exposed Structures: Fat Layer (Subcutaneous N/A N/A Tissue) Exposed: Yes Fascia: No Tendon: No Muscle: No Funes, Shalice ([id number]) Joint: No Bone: No Epithelialization: Small (1-33%) N/A N/A Periwound Skin Texture: Scarring: Yes N/A  N/A Excoriation: No Induration: No Callus: No Crepitus: No Rash: No Periwound Skin Moisture: Maceration: Yes N/A N/A Dry/Scaly: No Periwound Skin Color: Atrophie Blanche: No N/A N/A Cyanosis: No Ecchymosis: No Erythema: No Hemosiderin Staining: No Mottled: No Pallor: No Rubor: No Temperature: No Abnormality N/A N/A Tenderness on Palpation: Yes N/A N/A Wound Preparation: Ulcer Cleansing: N/A N/A Rinsed/Irrigated with Saline Topical Anesthetic Applied: Other: lidocaine 4% Treatment Notes Wound #1 (Left, Medial Lower Leg) 1. Cleansed with: Clean wound with Normal Saline 2. Anesthetic Topical Lidocaine 4% cream to wound bed prior to debridement 4. Dressing Applied: Hydrogel Prisma Ag 5. Secondary Dressing Applied Bordered Foam Dressing Electronic Signature(s) Signed: 11/16/2017 5:40:08 PM By: Baltazar Najjar MD Entered By: Baltazar Najjar on 11/16/2017 14:19:22 Zoe Robinson ([id number]) -------------------------------------------------------------------------------- Multi-Disciplinary Care Plan Details Patient Name: Zoe Robinson Date of Service: 11/16/2017 1:00 PM Medical Record Number: [id number] Patient Account Number: [id number] Date of Birth/Sex: [birth year] (85 y.o. F) Treating RN: Huel Coventry Primary Care Emaline Karnes: Gabriel Cirri Other Clinician: Referring Dmari Schubring: Gabriel Cirri Treating Demya Scruggs/Extender: Altamese [place] in Treatment: 10 Active Inactive ` Abuse / Safety / Falls / Self Care Management Nursing Diagnoses: Potential for falls Goals: Patient will not experience any injury related to falls Date Initiated: 09/07/2017 Target Resolution Date: 12/03/2017 Goal Status: Active Interventions: Assess fall risk on admission and as needed Notes: ` Orientation to the Wound Care Program Nursing Diagnoses: Knowledge deficit related to the wound healing center program Goals: Patient/caregiver will verbalize understanding of the Wound  Healing Center Program Date Initiated: 09/07/2017 Target Resolution Date: 12/03/2017 Goal Status: Active Interventions: Provide education on orientation to the wound center Notes: ` Wound/Skin Impairment Nursing Diagnoses: Impaired tissue integrity Goals: Ulcer/skin breakdown will heal within 14 weeks Date Initiated: 09/07/2017 Target Resolution Date: 12/03/2017 Goal Status: Active Interventions: MIYANNA, WIERSMA ([id number]) Assess patient/caregiver ability to obtain necessary supplies Assess patient/caregiver ability to perform ulcer/skin care regimen upon admission and as needed Assess ulceration(s) every visit Notes: Electronic Signature(s) Signed: 11/16/2017 8:51:29 PM By: Elliot Gurney, BSN, RN, CWS, Kim RN, BSN Entered By: Elliot Gurney, BSN, RN, CWS, Kim on 11/16/2017 13:40:55 Zoe Robinson ([id number]) -------------------------------------------------------------------------------- Pain Assessment Details Patient Name: Zoe Robinson Date of Service: 11/16/2017 1:00 PM Medical Record Number: [id number] Patient Account Number: [id number] Date of Birth/Sex: [birth year] (85 y.o. F) Treating RN: Curtis Sites Primary Care Annebelle Bostic: Gabriel Cirri Other Clinician: Referring Layne Lebon: Gabriel Cirri Treating Onedia Vargus/Extender: Altamese [place] in Treatment: 10 Active Problems  Location of Pain Severity and Description of Pain Patient Has Paino Yes Site Locations Pain Location: Generalized Pain With Dressing Change: No Pain Management and Medication Current Pain Management: Electronic Signature(s) Signed: 11/16/2017 5:00:43 PM By: Curtis Sites Entered By: Curtis Sites on 11/16/2017 13:00:04 Zoe Robinson ([id number]) -------------------------------------------------------------------------------- Patient/Caregiver Education Details Patient Name: Zoe Robinson Date of Service: 11/16/2017 1:00 PM Medical Record Number: [id number] Patient Account Number:  [id number] Date of Birth/Gender: 09-Jun-1932 (85 y.o. F) Treating RN: Curtis Sites Primary Care Physician: Gabriel Cirri Other Clinician: Referring Physician: Gabriel Cirri Treating Physician/Extender: Altamese [place] in Treatment: 10 Education Assessment Education Provided To: Patient Education Topics Provided Wound/Skin Impairment: Handouts: Other: wound care as ordered Methods: Demonstration, Explain/Verbal Responses: State content correctly Electronic Signature(s) Signed: 11/16/2017 5:00:43 PM By: Curtis Sites Entered By: Curtis Sites on 11/16/2017 13:53:10 Zoe Robinson ([id number]) -------------------------------------------------------------------------------- Wound Assessment Details Patient Name: Zoe Robinson Date of Service: 11/16/2017 1:00 PM Medical Record Number: [id number] Patient Account Number: [id number] Date of Birth/Sex: Jan 27, 1932 (85 y.o. F) Treating RN: Curtis Sites Primary Care Kendell Sagraves: Gabriel Cirri Other Clinician: Referring Elane Peabody: Gabriel Cirri Treating Shagun Wordell/Extender: Altamese [place] in Treatment: 10 Wound Status Wound Number: 1 Primary Venous Leg Ulcer Etiology: Wound Location: Left Lower Leg - Medial Wound Open Wounding Event: Trauma Status: Date Acquired: 05/02/2017 Comorbid Cataracts, Anemia, Arrhythmia, Hypertension, Weeks Of Treatment: 10 History: Rheumatoid Arthritis, Osteoarthritis Clustered Wound: No Photos Photo Uploaded By: Curtis Sites on 11/16/2017 14:00:27 Wound Measurements Length: (cm) 1.5 Width: (cm) 1.2 Depth: (cm) 0.3 Area: (cm) 1.414 Volume: (cm) 0.424 % Reduction in Area: 81.3% % Reduction in Volume: 43.9% Epithelialization: Small (1-33%) Tunneling: No Undermining: No Wound Description Full Thickness Without Exposed Support Classification: Structures Wound Margin: Flat and Intact Exudate Large Amount: Exudate Type: Serous Exudate Color: amber Foul Odor After  Cleansing: No Slough/Fibrino Yes Wound Bed Granulation Amount: Large (67-100%) Exposed Structure Granulation Quality: Red, Pink Fascia Exposed: No Necrotic Amount: Small (1-33%) Fat Layer (Subcutaneous Tissue) Exposed: Yes Necrotic Quality: Adherent Slough Tendon Exposed: No Muscle Exposed: No Joint Exposed: No Bone Exposed: No Periwound Skin Texture Texture Color Kelch, Illene ([id number]) No Abnormalities Noted: No No Abnormalities Noted: No Callus: No Atrophie Blanche: No Crepitus: No Cyanosis: No Excoriation: No Ecchymosis: No Induration: No Erythema: No Rash: No Hemosiderin Staining: No Scarring: Yes Mottled: No Pallor: No Moisture Rubor: No No Abnormalities Noted: No Dry / Scaly: No Temperature / Pain Maceration: Yes Temperature: No Abnormality Tenderness on Palpation: Yes Wound Preparation Ulcer Cleansing: Rinsed/Irrigated with Saline Topical Anesthetic Applied: Other: lidocaine 4%, Treatment Notes Wound #1 (Left, Medial Lower Leg) 1. Cleansed with: Clean wound with Normal Saline 2. Anesthetic Topical Lidocaine 4% cream to wound bed prior to debridement 4. Dressing Applied: Hydrogel Prisma Ag 5. Secondary Dressing Applied Bordered Foam Dressing Electronic Signature(s) Signed: 11/16/2017 5:00:43 PM By: Curtis Sites Entered By: Curtis Sites on 11/16/2017 13:06:28 Zoe Robinson ([id number]) -------------------------------------------------------------------------------- Vitals Details Patient Name: Zoe Robinson Date of Service: 11/16/2017 1:00 PM Medical Record Number: [id number] Patient Account Number: [id number] Date of Birth/Sex: Sep 26, 1931 (85 y.o. F) Treating RN: Curtis Sites Primary Care Ludell Zacarias: Gabriel Cirri Other Clinician: Referring Ronesha Heenan: Gabriel Cirri Treating Araeya Lamb/Extender: Altamese [place] in Treatment: 10 Vital Signs Time Taken: 13:00 Temperature (F): 97.7 Height (in): 59 Pulse (bpm):  86 Weight (lbs): 117 Respiratory Rate (breaths/min): 18 Body Mass Index (BMI): 23.6 Blood Pressure (mmHg): 114/44 Reference Range: 80 - 120 mg / dl Electronic Signature(s) Signed: 11/16/2017 5:00:43 PM By: Curtis Sites Entered By: Curtis Sites on  11/16/2017 13:00:52 

## 2017-11-26 NOTE — Progress Notes (Signed)
Zoe, Robinson ([id number]) Visit Report for 11/23/2017 Arrival Information Details Patient Name: Zoe Robinson, Zoe Robinson Date of Service: 11/23/2017 1:45 PM Medical Record Number: [id number] Patient Account Number: [id number] Date of Birth/Sex: Jun 30, 1932 (85 y.o. F) Treating RN: Phillis Haggis Primary Care Darcella Shiffman: Gabriel Cirri Other Clinician: Referring Erin Obando: Gabriel Cirri Treating Fredrik Mogel/Extender: Altamese [place] in Treatment: 11 Visit Information History Since Last Visit All ordered tests and consults were completed: No Patient Arrived: Ambulatory Added or deleted any medications: No Arrival Time: 14:03 Any new allergies or adverse reactions: No Accompanied By: daughter Had a fall or experienced change in No Transfer Assistance: None activities of daily living that may affect Patient Identification Verified: Yes risk of falls: Secondary Verification Process Yes Signs or symptoms of abuse/neglect since last visito No Completed: Hospitalized since last visit: No Patient Requires Transmission-Based No Implantable device outside of the clinic excluding No Precautions: cellular tissue based products placed in the center Patient Has Alerts: Yes since last visit: Patient Alerts: Patient on Blood Has Dressing in Place as Prescribed: Yes Thinner Pain Present Now: No xarelto Electronic Signature(s) Signed: 11/23/2017 4:27:21 PM By: Alejandro Mulling Entered By: Alejandro Mulling on 11/23/2017 14:04:09 Zoe Robinson ([id number]) -------------------------------------------------------------------------------- Encounter Discharge Information Details Patient Name: Zoe Robinson Date of Service: 11/23/2017 1:45 PM Medical Record Number: [id number] Patient Account Number: [id number] Date of Birth/Sex: 11-04-1931 (85 y.o. F) Treating RN: Curtis Sites Primary Care Exilda Wilhite: Gabriel Cirri Other Clinician: Referring Lilit Cinelli: Gabriel Cirri Treating  Kersten Salmons/Extender: Altamese [place] in Treatment: 11 Encounter Discharge Information Items Discharge Pain Level: 0 Discharge Condition: Stable Ambulatory Status: Ambulatory Discharge Destination: Home Transportation: Private Auto Schedule Follow-up Appointment: Yes Medication Reconciliation completed and No provided to Patient/Care Toula Miyasaki: Provided on Clinical Summary of Care: 11/23/2017 Form Type Recipient Paper Patient JK Electronic Signature(s) Signed: 11/23/2017 4:21:17 PM By: Renne Crigler Entered By: Renne Crigler on 11/23/2017 14:51:48 Zoe Robinson ([id number]) -------------------------------------------------------------------------------- Lower Extremity Assessment Details Patient Name: Zoe Robinson Date of Service: 11/23/2017 1:45 PM Medical Record Number: [id number] Patient Account Number: [id number] Date of Birth/Sex: [birth year] (85 y.o. F) Treating RN: Phillis Haggis Primary Care Kenady Doxtater: Gabriel Cirri Other Clinician: Referring Lamyah Creed: Gabriel Cirri Treating Verlena Marlette/Extender: Altamese [place] in Treatment: 11 Vascular Assessment Pulses: Dorsalis Pedis Palpable: [Left:Yes] Posterior Tibial Extremity colors, hair growth, and conditions: Extremity Color: [Left:Mottled] Temperature of Extremity: [Left:Warm] Capillary Refill: [Left:< 3 seconds] Toe Nail Assessment Left: Right: Thick: Yes Discolored: Yes Deformed: Yes Improper Length and Hygiene: Yes Electronic Signature(s) Signed: 11/23/2017 4:27:21 PM By: Alejandro Mulling Entered By: Alejandro Mulling on 11/23/2017 14:10:43 Zoe Robinson ([id number]) -------------------------------------------------------------------------------- Multi Wound Chart Details Patient Name: Zoe Robinson Date of Service: 11/23/2017 1:45 PM Medical Record Number: [id number] Patient Account Number: [id number] Date of Birth/Sex: 12/21/1931 (85 y.o. F) Treating RN: Curtis Sites Primary  Care Melayah Skorupski: Gabriel Cirri Other Clinician: Referring Dontrail Blackwell: Gabriel Cirri Treating Shakur Lembo/Extender: Altamese [place] in Treatment: 11 Vital Signs Height(in): 59 Pulse(bpm): 81 Weight(lbs): 117 Blood Pressure(mmHg): 158/69 Body Mass Index(BMI): 24 Temperature(F): 98.1 Respiratory Rate 18 (breaths/min): Photos: [1:No Photos] [N/A:N/A] Wound Location: [1:Left Lower Leg - Medial] [N/A:N/A] Wounding Event: [1:Trauma] [N/A:N/A] Primary Etiology: [1:Venous Leg Ulcer] [N/A:N/A] Comorbid History: [1:Cataracts, Anemia, Arrhythmia, Hypertension, Rheumatoid Arthritis, Osteoarthritis] [N/A:N/A] Date Acquired: [1:05/02/2017] [N/A:N/A] Weeks of Treatment: [1:11] [N/A:N/A] Wound Status: [1:Open] [N/A:N/A] Measurements L x W x D [1:2.2x1.2x0.3] [N/A:N/A] (cm) Area (cm) : [1:2.073] [N/A:N/A] Volume (cm) : [1:0.622] [N/A:N/A] % Reduction in Area: [1:72.60%] [N/A:N/A] % Reduction in Volume: [1:17.70%] [N/A:N/A] Classification: [1:Full Thickness Without Exposed Support Structures] [N/A:N/A]  Exudate Amount: [1:Large] [N/A:N/A] Exudate Type: [1:Serous] [N/A:N/A] Exudate Color: [1:amber] [N/A:N/A] Wound Margin: [1:Flat and Intact] [N/A:N/A] Granulation Amount: [1:Small (1-33%)] [N/A:N/A] Granulation Quality: [1:Red, Pink] [N/A:N/A] Necrotic Amount: [1:Large (67-100%)] [N/A:N/A] Exposed Structures: [1:Fat Layer (Subcutaneous Tissue) Exposed: Yes Fascia: No Tendon: No Muscle: No Joint: No Bone: No] [N/A:N/A] Epithelialization: [1:Small (1-33%)] [N/A:N/A] Debridement: [1:Debridement - Excisional] [N/A:N/A] Pre-procedure [1:14:36] [N/A:N/A] Verification/Time Out Taken: DANIJELA, VESSEY ([id number]) Pain Control: Lidocaine 4% Topical Solution N/A N/A Tissue Debrided: Subcutaneous, Slough N/A N/A Level: Skin/Subcutaneous Tissue N/A N/A Debridement Area (sq cm): 2.64 N/A N/A Instrument: Curette N/A N/A Bleeding: Minimum N/A N/A Hemostasis Achieved: Pressure N/A  N/A Procedural Pain: 0 N/A N/A Post Procedural Pain: 0 N/A N/A Debridement Treatment Procedure was tolerated well N/A N/A Response: Post Debridement 2.2x1.2x0.4 N/A N/A Measurements L x W x D (cm) Post Debridement Volume: 0.829 N/A N/A (cm) Periwound Skin Texture: Scarring: Yes N/A N/A Excoriation: No Induration: No Callus: No Crepitus: No Rash: No Periwound Skin Moisture: Maceration: Yes N/A N/A Dry/Scaly: No Periwound Skin Color: Atrophie Blanche: No N/A N/A Cyanosis: No Ecchymosis: No Erythema: No Hemosiderin Staining: No Mottled: No Pallor: No Rubor: No Temperature: No Abnormality N/A N/A Tenderness on Palpation: Yes N/A N/A Wound Preparation: Ulcer Cleansing: N/A N/A Rinsed/Irrigated with Saline Topical Anesthetic Applied: Other: lidocaine 4% Procedures Performed: Debridement N/A N/A Treatment Notes Wound #1 (Left, Medial Lower Leg) 1. Cleansed with: Clean wound with Normal Saline 2. Anesthetic Topical Lidocaine 4% cream to wound bed prior to debridement 4. Dressing Applied: Hydrogel Prisma Ag 5. Secondary Dressing Applied Bordered Foam Dressing Electronic Signature(s) CHIANN, GOFFREDO ([id number]) Signed: 11/23/2017 4:28:35 PM By: Baltazar Najjar MD Entered By: Baltazar Najjar on 11/23/2017 15:40:28 Zoe Robinson ([id number]) -------------------------------------------------------------------------------- Multi-Disciplinary Care Plan Details Patient Name: Zoe Robinson Date of Service: 11/23/2017 1:45 PM Medical Record Number: [id number] Patient Account Number: [id number] Date of Birth/Sex: 30-Apr-1932 (85 y.o. F) Treating RN: Curtis Sites Primary Care Thompson Mckim: Gabriel Cirri Other Clinician: Referring Georgi Navarrete: Gabriel Cirri Treating Niobe Dick/Extender: Altamese [place] in Treatment: 11 Active Inactive ` Abuse / Safety / Falls / Self Care Management Nursing Diagnoses: Potential for falls Goals: Patient will not experience any  injury related to falls Date Initiated: 09/07/2017 Target Resolution Date: 12/03/2017 Goal Status: Active Interventions: Assess fall risk on admission and as needed Notes: ` Orientation to the Wound Care Program Nursing Diagnoses: Knowledge deficit related to the wound healing center program Goals: Patient/caregiver will verbalize understanding of the Wound Healing Center Program Date Initiated: 09/07/2017 Target Resolution Date: 12/03/2017 Goal Status: Active Interventions: Provide education on orientation to the wound center Notes: ` Wound/Skin Impairment Nursing Diagnoses: Impaired tissue integrity Goals: Ulcer/skin breakdown will heal within 14 weeks Date Initiated: 09/07/2017 Target Resolution Date: 12/03/2017 Goal Status: Active Interventions: TRENTON, VERNE ([id number]) Assess patient/caregiver ability to obtain necessary supplies Assess patient/caregiver ability to perform ulcer/skin care regimen upon admission and as needed Assess ulceration(s) every visit Notes: Electronic Signature(s) Signed: 11/23/2017 4:48:19 PM By: Curtis Sites Entered By: Curtis Sites on 11/23/2017 14:37:48 Zoe Robinson ([id number]) -------------------------------------------------------------------------------- Pain Assessment Details Patient Name: Zoe Robinson Date of Service: 11/23/2017 1:45 PM Medical Record Number: [id number] Patient Account Number: [id number] Date of Birth/Sex: 03-26-1932 (85 y.o. F) Treating RN: Phillis Haggis Primary Care Kieffer Blatz: Gabriel Cirri Other Clinician: Referring Jered Heiny: Gabriel Cirri Treating Joanny Dupree/Extender: Altamese [place] in Treatment: 11 Active Problems Location of Pain Severity and Description of Pain Patient Has Paino No Site Locations Pain Management and Medication Current Pain Management: Electronic Signature(s) Signed: 11/23/2017 4:27:21 PM  By: Alejandro Mulling Entered By: Alejandro Mulling on 11/23/2017  14:04:17 Zoe Robinson ([id number]) -------------------------------------------------------------------------------- Patient/Caregiver Education Details Patient Name: Zoe Robinson Date of Service: 11/23/2017 1:45 PM Medical Record Number: [id number] Patient Account Number: [id number] Date of Birth/Gender: 1932-02-22 (85 y.o. F) Treating RN: Renne Crigler Primary Care Physician: Gabriel Cirri Other Clinician: Referring Physician: Gabriel Cirri Treating Physician/Extender: Altamese [place] in Treatment: 11 Education Assessment Education Provided To: Patient Education Topics Provided Wound Debridement: Handouts: Wound Debridement Methods: Explain/Verbal Responses: State content correctly Wound/Skin Impairment: Handouts: Caring for Your Ulcer Methods: Explain/Verbal Responses: State content correctly Electronic Signature(s) Signed: 11/23/2017 4:21:17 PM By: Renne Crigler Entered By: Renne Crigler on 11/23/2017 14:52:08 Zoe Robinson ([id number]) -------------------------------------------------------------------------------- Wound Assessment Details Patient Name: Zoe Robinson Date of Service: 11/23/2017 1:45 PM Medical Record Number: [id number] Patient Account Number: [id number] Date of Birth/Sex: 02/19/1932 (85 y.o. F) Treating RN: Phillis Haggis Primary Care Janat Tabbert: Gabriel Cirri Other Clinician: Referring Trishia Cuthrell: Gabriel Cirri Treating Viviana Trimble/Extender: Altamese [place] in Treatment: 11 Wound Status Wound Number: 1 Primary Venous Leg Ulcer Etiology: Wound Location: Left Lower Leg - Medial Wound Open Wounding Event: Trauma Status: Date Acquired: 05/02/2017 Comorbid Cataracts, Anemia, Arrhythmia, Hypertension, Weeks Of Treatment: 11 History: Rheumatoid Arthritis, Osteoarthritis Clustered Wound: No Photos Photo Uploaded By: Alejandro Mulling on 11/23/2017 16:20:09 Wound Measurements Length: (cm) 2.2 Width: (cm) 1.2 Depth:  (cm) 0.3 Area: (cm) 2.073 Volume: (cm) 0.622 % Reduction in Area: 72.6% % Reduction in Volume: 17.7% Epithelialization: Small (1-33%) Tunneling: No Undermining: No Wound Description Full Thickness Without Exposed Support Classification: Structures Wound Margin: Flat and Intact Exudate Large Amount: Exudate Type: Serous Exudate Color: amber Foul Odor After Cleansing: No Slough/Fibrino Yes Wound Bed Granulation Amount: Small (1-33%) Exposed Structure Granulation Quality: Red, Pink Fascia Exposed: No Necrotic Amount: Large (67-100%) Fat Layer (Subcutaneous Tissue) Exposed: Yes Necrotic Quality: Adherent Slough Tendon Exposed: No Muscle Exposed: No Joint Exposed: No Bone Exposed: No Periwound Skin Texture Texture Color Allcorn, Seren ([id number]) No Abnormalities Noted: No No Abnormalities Noted: No Callus: No Atrophie Blanche: No Crepitus: No Cyanosis: No Excoriation: No Ecchymosis: No Induration: No Erythema: No Rash: No Hemosiderin Staining: No Scarring: Yes Mottled: No Pallor: No Moisture Rubor: No No Abnormalities Noted: No Dry / Scaly: No Temperature / Pain Maceration: Yes Temperature: No Abnormality Tenderness on Palpation: Yes Wound Preparation Ulcer Cleansing: Rinsed/Irrigated with Saline Topical Anesthetic Applied: Other: lidocaine 4%, Treatment Notes Wound #1 (Left, Medial Lower Leg) 1. Cleansed with: Clean wound with Normal Saline 2. Anesthetic Topical Lidocaine 4% cream to wound bed prior to debridement 4. Dressing Applied: Hydrogel Prisma Ag 5. Secondary Dressing Applied Bordered Foam Dressing Electronic Signature(s) Signed: 11/23/2017 4:27:21 PM By: Alejandro Mulling Entered By: Alejandro Mulling on 11/23/2017 14:09:38 Zoe Robinson ([id number]) -------------------------------------------------------------------------------- Vitals Details Patient Name: Zoe Robinson Date of Service: 11/23/2017 1:45 PM Medical Record  Number: [id number] Patient Account Number: [id number] Date of Birth/Sex: [birth year] (85 y.o. F) Treating RN: Phillis Haggis Primary Care Dezmin Kittelson: Gabriel Cirri Other Clinician: Referring Keneisha Heckart: Gabriel Cirri Treating Shakila Mak/Extender: Altamese [place] in Treatment: 11 Vital Signs Time Taken: 14:04 Temperature (F): 98.1 Height (in): 59 Pulse (bpm): 81 Weight (lbs): 117 Respiratory Rate (breaths/min): 18 Body Mass Index (BMI): 23.6 Blood Pressure (mmHg): 158/69 Reference Range: 80 - 120 mg / dl Electronic Signature(s) Signed: 11/23/2017 4:27:21 PM By: Alejandro Mulling Entered By: Alejandro Mulling on 11/23/2017 14:06:03

## 2017-11-26 NOTE — Progress Notes (Signed)
AZAYLEA, MAVES ([id number]) Visit Report for 11/23/2017 Debridement Details Patient Name: Zoe Robinson, Zoe Robinson Date of Service: 11/23/2017 1:45 PM Medical Record Number: [id number] Patient Account Number: [id number] Date of Birth/Sex: 06-12-1932 (85 y.o. F) Treating RN: Curtis Sites Primary Care Provider: Gabriel Cirri Other Clinician: Referring Provider: Gabriel Cirri Treating Provider/Extender: Altamese [place] in Treatment: 11 Debridement Performed for Wound #1 Left,Medial Lower Leg Assessment: Performed By: Physician Maxwell Caul, MD Debridement Type: Debridement Severity of Tissue Pre Fat layer exposed Debridement: Pre-procedure Verification/Time Yes - 14:36 Out Taken: Start Time: 14:36 Pain Control: Lidocaine 4% Topical Solution Total Area Debrided (L x W): 2.2 (cm) x 1.2 (cm) = 2.64 (cm) Tissue and other material Viable, Non-Viable, Slough, Subcutaneous, Fibrin/Exudate, Slough debrided: Level: Skin/Subcutaneous Tissue Debridement Description: Excisional Instrument: Curette Bleeding: Minimum Hemostasis Achieved: Pressure End Time: 14:38 Procedural Pain: 0 Post Procedural Pain: 0 Response to Treatment: Procedure was tolerated well Post Debridement Measurements of Total Wound Length: (cm) 2.2 Width: (cm) 1.2 Depth: (cm) 0.4 Volume: (cm) 0.829 Character of Wound/Ulcer Post Debridement: Improved Severity of Tissue Post Debridement: Fat layer exposed Post Procedure Diagnosis Same as Pre-procedure Electronic Signature(s) Signed: 11/23/2017 4:28:35 PM By: Baltazar Najjar MD Signed: 11/23/2017 4:48:19 PM By: Curtis Sites Entered By: Baltazar Najjar on 11/23/2017 15:43:08 Zoe Robinson ([id number]) -------------------------------------------------------------------------------- HPI Details Patient Name: Zoe Robinson Date of Service: 11/23/2017 1:45 PM Medical Record Number: [id number] Patient Account Number: [id number] Date of Birth/Sex:  [birth year] (85 y.o. F) Treating RN: Curtis Sites Primary Care Provider: Gabriel Cirri Other Clinician: Referring Provider: Gabriel Cirri Treating Provider/Extender: Altamese [place] in Treatment: 11 History of Present Illness HPI Description: 09/07/17; this is an 84 year old woman who arrives accompanied by her daughter. She has a history of severe rheumatoid arthritis followed by Dr. Corliss Skains in [place]. By review of care everywhere it appears that she had cellulitis of her left lower leg in September 2018. This may have been caused by a dog scratch. She was followed and her primary care office at [facility] affiliated group. She received antibiotics, Silvadene cream as well as Una boots. This was followed through late October and the wound was healed out on 10/31. The patient and her daughters state that the current wound started with a scissor injury to the medial left calf before the wound was actually healed out in other words this is a different wound area. The original wound was more medial on the left anterior tibial area. Since then they've been using Silvadene cream and apparently she changed doctors who recommended Polysporin and they've been referred here for the injury on her left medial calf. She does not have a history of rheumatoid-related skin issues/vasculitis The patient is a type II diabetic with most recent hemoglobin A1c of 6. She has advanced seropositive rheumatoid arthritis on prednisone 10 mg alternating with 5 and methotrexate. I see she is also on xarelto while Im not really sure why. She has significant osteoporosis, history of hypercalcemia. ABI in this clinic is 1.08. She was a former smoker 09/14/17; patient's wound slightly smaller today however still covered in a very thick adherent necrotic surface. We have been using Santyl under compression. We finally got home health to start yesterday "advanced" 09/21/17; not too much change in the area here. We  have been using Santyl under compression and I'll continue this after debridement today. Consider Iodoflex if we don't get a better looking wound surface 09/22/17-she presents today as an urgent follow-up secondary to uncontrolled bleeding overnight. She has saturated through the  3-layer compression. Upon dressing removal there is significant clot formation, she continues to bleed from the most proximal aspect of the ulcer. 4 silver nitrate sticks were used to try to obtain hemostasis, along with 10 minutes of pressure dressing. The wound continues to bleed although not as briskly; this appears to be more of a venous bleed and arterial. She is hemodynamically stable upon presentation to the clinic SBP 130's, HR 80's with no complaints of dizziness, lightheadedness. We are unable to obtain any hemostatic agents from the emergency department. Since she has bled through the pressure dressing and continues to bleed despite all efforts available in the clinic she has been sent to the emergency department for evaluation and treatment 09/28/17; the patient has not had an easy time since the last time I saw her. She continues to have nonviable surface over the wound perhaps slightly better using Santyl. She has on xarelto which no doubt contributes to the bleeding after debridement. I'm going to change her to Iodoflex today. She is having a lot of discomfort with this we gave her tramadol which is reasonable 10/05/17;patient continues to have nonviable surface over a very difficult wound.we also note that her propensity to bleed. We have been using Iodoflex. previously minimal response previously to [employer]. She tells me that her rheumatologist is made adjustments to both her prednisone and methotrexate in attempt to help heal this difficult area. One would wonder if this is all venous insufficiency and whether the rheumatoid arthritis itself could be contributing to an inflammatory  ulcer/rheumatoid vasculitis 10/12/17 She is here in follow up evaluation for a left lower extremity ulcer. There is no improvement. We will transition to santyl. 10/19/17; she has transitioned back to santyl and has been changing this every day. There has been some improvement although still requiring mechanical debridement 10/25/17; still using Santyl on the area on the left lower extremity. There is improvement for the first time. Recently no debridement was felt to be necessary.she did not respond well to Iodoflex. Request for fThera skin was denied by her insurance 11/02/17; still using Santyl. She did not respond well to Iodoflex. We are getting a much better surface that this deep wound. Still requiring debridement.there is no advanced treatment option through her primary insurance [[employer]] 11/09/17; we are still using Santyl. Better looking surface still requiring mechanical debridement. There is on the left medial Wombles, Catherin ([id number]) lower extremity.this initially started at the time of cellulitis in the left leg which may have been caused by a dog scratch. Had a lot of trouble getting in adequate surface on this wound. We did use Iodoflex for a period of time. we have not been able to get a proper surface to consider an alternative dressing 11/16/17; we finally have this wound down to a healthy granulated surface. We can graduate from [facility] to silver collagen. I have again reviewed the history of this apparently the patient had a scratch injury in the lateral aspect of her leg. In the course of wrapping this this was apparently a scissor injury at her primary's office. I'm he got this wrong and the original history 11/23/17; switch to Santyl collagen last week. Arrives today with some slough over the surface over this represents an easy debridement. No change in dimensions. This was a scissor injury initially. Electronic Signature(s) Signed: 11/23/2017 4:28:35 PM By: Baltazar Najjar  MD Entered By: Baltazar Najjar on 11/23/2017 15:44:13 Zoe Robinson ([id number]) -------------------------------------------------------------------------------- Physical Exam Details Patient Name: Zoe Robinson Date of Service: 11/23/2017  1:45 PM Medical Record Number: [id number] Patient Account Number: [id number] Date of Birth/Sex: [birth year] (84 y.o. F) Treating RN: Curtis Sites Primary Care Provider: Gabriel Cirri Other Clinician: Referring Provider: Gabriel Cirri Treating Provider/Extender: Altamese [place] in Treatment: 11 Constitutional Patient is hypertensive.. Pulse regular and within target range for patient.Marland Kitchen Respirations regular, non-labored and within target range.. Temperature is normal and within the target range for the patient.Marland Kitchen appears in no distress. Notes wound exam; patient's wound surface required debridement today although the surface looks better post-debridement. Not much change in dimensions. No evidence of surrounding infection. Hemostasis with silver nitrate Electronic Signature(s) Signed: 11/23/2017 4:28:35 PM By: Baltazar Najjar MD Entered By: Baltazar Najjar on 11/23/2017 15:46:13 Zoe Robinson ([id number]) -------------------------------------------------------------------------------- Physician Orders Details Patient Name: Zoe Robinson Date of Service: 11/23/2017 1:45 PM Medical Record Number: [id number] Patient Account Number: [id number] Date of Birth/Sex: 17-Aug-1931 (85 y.o. F) Treating RN: Curtis Sites Primary Care Provider: Gabriel Cirri Other Clinician: Referring Provider: Gabriel Cirri Treating Provider/Extender: Altamese [place] in Treatment: 11 Verbal / Phone Orders: No Diagnosis Coding Wound Cleansing Wound #1 Left,Medial Lower Leg o Cleanse wound with mild soap and water Anesthetic (add to Medication List) Wound #1 Left,Medial Lower Leg o Topical Lidocaine 4% cream applied to wound bed prior to  debridement (In Clinic Only). Skin Barriers/Peri-Wound Care Wound #1 Left,Medial Lower Leg o Barrier cream o Moisturizing lotion Primary Wound Dressing Wound #1 Left,Medial Lower Leg o Silver Collagen - with Hydrogel Secondary Dressing Wound #1 Left,Medial Lower Leg o Boardered Foam Dressing Dressing Change Frequency Wound #1 Left,Medial Lower Leg o Change dressing every other day. Follow-up Appointments Wound #1 Left,Medial Lower Leg o Return Appointment in 1 week. Edema Control Wound #1 Left,Medial Lower Leg o Patient to wear own compression stockings o Elevate legs to the level of the heart and pump ankles as often as possible Home Health Wound #1 Left,Medial Lower Leg o Continue Home Health Visits o Home Health Nurse may visit PRN to address patientos wound care needs. o FACE TO FACE ENCOUNTER: MEDICARE and MEDICAID PATIENTS: I certify that this patient is under my care and that I had a face-to-face encounter that meets the physician face-to-face encounter requirements with this patient on this date. The encounter with the patient was in whole or in part for the following MEDICAL DERIYAH, KUNATH ([id number]) CONDITION: (primary reason for Home Healthcare) MEDICAL NECESSITY: I certify, that based on my findings, NURSING services are a medically necessary home health service. HOME BOUND STATUS: I certify that my clinical findings support that this patient is homebound (i.e., Due to illness or injury, pt requires aid of supportive devices such as crutches, cane, wheelchairs, walkers, the use of special transportation or the assistance of another person to leave their place of residence. There is a normal inability to leave the home and doing so requires considerable and taxing effort. Other absences are for medical reasons / religious services and are infrequent or of short duration when for other reasons). o If current dressing causes regression in wound  condition, may D/C ordered dressing product/s and apply Normal Saline Moist Dressing daily until next Wound Healing Center / Other MD appointment. Notify Wound Healing Center of regression in wound condition at [phone]. o Please direct any NON-WOUND related issues/requests for orders to patient's Primary Care Physician Electronic Signature(s) Signed: 11/23/2017 4:28:35 PM By: Baltazar Najjar MD Signed: 11/23/2017 4:48:19 PM By: Curtis Sites Entered By: Curtis Sites on 11/23/2017 14:41:13 Zoe Robinson ([id number]) -------------------------------------------------------------------------------- Problem List Details  Patient Name: LAMIRA, PATTEE Date of Service: 11/23/2017 1:45 PM Medical Record Number: [id number] Patient Account Number: [id number] Date of Birth/Sex: Jan 15, 1932 (85 y.o. 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 09/07/2017 Yes I87.312 Chronic venous hypertension (idiopathic) with ulcer of left 09/07/2017 Yes lower extremity M05.80 Other rheumatoid arthritis with rheumatoid factor of 09/07/2017 Yes unspecified site Inactive Problems Resolved Problems Electronic Signature(s) Signed: 11/23/2017 4:28:35 PM By: Baltazar Najjar MD Entered By: Baltazar Najjar on 11/23/2017 15:40:11 Zoe Robinson ([id number]) -------------------------------------------------------------------------------- Progress Note Details Patient Name: Zoe Robinson Date of Service: 11/23/2017 1:45 PM Medical Record Number: [id number] Patient Account Number: [id number] Date of Birth/Sex: Nov 07, 1931 (85 y.o. F) Treating RN: Curtis Sites Primary Care Provider: Gabriel Cirri  Other Clinician: Referring Provider: Gabriel Cirri Treating Provider/Extender: Altamese [place] in Treatment: 11 Subjective History of Present Illness (HPI) 09/07/17; this is an 84 year old woman who arrives accompanied by her daughter. She has a history of severe rheumatoid arthritis followed by Dr. Corliss Skains in [place]. By review of care everywhere it appears that she had cellulitis of her left lower leg in September 2018. This may have been caused by a dog scratch. She was followed and her primary care office at [facility] affiliated group. She received antibiotics, Silvadene cream as well as Una boots. This was followed through late October and the wound was healed out on 10/31. The patient and her daughters state that the current wound started with a scissor injury to the medial left calf before the wound was actually healed out in other words this is a different wound area. The original wound was more medial on the left anterior tibial area. Since then they've been using Silvadene cream and apparently she changed doctors who recommended Polysporin and they've been referred here for the injury on her left medial calf. She does not have a history of rheumatoid-related skin issues/vasculitis The patient is a type II diabetic with most recent hemoglobin A1c of 6. She has advanced seropositive rheumatoid arthritis on prednisone 10 mg alternating with 5 and methotrexate. I see she is also on xarelto while Im not really sure why. She has significant osteoporosis, history of hypercalcemia. ABI in this clinic is 1.08. She was a former smoker 09/14/17; patient's wound slightly smaller today however still covered in a very thick adherent necrotic surface. We have been using Santyl under compression. We finally got home health to start yesterday "advanced" 09/21/17; not too much change in the area here. We have been using Santyl under compression and I'll continue this after debridement today.  Consider Iodoflex if we don't get a better looking wound surface 09/22/17-she presents today as an urgent follow-up secondary to uncontrolled bleeding overnight. She has saturated through the 3-layer compression. Upon dressing removal there is significant clot formation, she continues to bleed from the most proximal aspect of the ulcer. 4 silver nitrate sticks were used to try to obtain hemostasis, along with 10 minutes of pressure dressing. The wound continues to bleed although not as briskly; this appears to be more of a venous bleed and arterial. She is hemodynamically stable upon presentation to the clinic SBP 130's, HR 80's with no complaints of dizziness, lightheadedness. We are unable to obtain any hemostatic agents from the emergency department. Since she has bled through the  pressure dressing and continues to bleed despite all efforts available in the clinic she has been sent to the emergency department for evaluation and treatment 09/28/17; the patient has not had an easy time since the last time I saw her. She continues to have nonviable surface over the wound perhaps slightly better using Santyl. She has on xarelto which no doubt contributes to the bleeding after debridement. I'm going to change her to Iodoflex today. She is having a lot of discomfort with this we gave her tramadol which is reasonable 10/05/17;patient continues to have nonviable surface over a very difficult wound.we also note that her propensity to bleed. We have been using Iodoflex. previously minimal response previously to [employer]. She tells me that her rheumatologist is made adjustments to both her prednisone and methotrexate in attempt to help heal this difficult area. One would wonder if this is all venous insufficiency and whether the rheumatoid arthritis itself could be contributing to an inflammatory ulcer/rheumatoid vasculitis 10/12/17 She is here in follow up evaluation for a left lower extremity ulcer. There is no  improvement. We will transition to santyl. 10/19/17; she has transitioned back to santyl and has been changing this every day. There has been some improvement although still requiring mechanical debridement 10/25/17; still using Santyl on the area on the left lower extremity. There is improvement for the first time. Recently no debridement was felt to be necessary.she did not respond well to Iodoflex. Request for fThera skin was denied by her insurance 11/02/17; still using Santyl. She did not respond well to Iodoflex. We are getting a much better surface that this deep wound. Still requiring debridement.there is no advanced treatment option through her primary insurance ANNIELEE, JEMMOTT ([id number]) 11/09/17; we are still using Santyl. Better looking surface still requiring mechanical debridement. There is on the left medial lower extremity.this initially started at the time of cellulitis in the left leg which may have been caused by a dog scratch. Had a lot of trouble getting in adequate surface on this wound. We did use Iodoflex for a period of time. we have not been able to get a proper surface to consider an alternative dressing 11/16/17; we finally have this wound down to a healthy granulated surface. We can graduate from [facility] to silver collagen. I have again reviewed the history of this apparently the patient had a scratch injury in the lateral aspect of her leg. In the course of wrapping this this was apparently a scissor injury at her primary's office. I'm he got this wrong and the original history 11/23/17; switch to Santyl collagen last week. Arrives today with some slough over the surface over this represents an easy debridement. No change in dimensions. This was a scissor injury initially. Objective Constitutional Patient is hypertensive.. Pulse regular and within target range for patient.Marland Kitchen Respirations regular, non-labored and within target range.. Temperature is normal and  within the target range for the patient.Marland Kitchen appears in no distress. Vitals Time Taken: 2:04 PM, Height: 59 in, Weight: 117 lbs, BMI: 23.6, Temperature: 98.1 F, Pulse: 81 bpm, Respiratory Rate: 18 breaths/min, Blood Pressure: 158/69 mmHg. General Notes: wound exam; patient's wound surface required debridement today although the surface looks better post- debridement. Not much change in dimensions. No evidence of surrounding infection. Hemostasis with silver nitrate Integumentary (Hair, Skin) Wound #1 status is Open. Original cause of wound was Trauma. The wound is located on the Left,Medial Lower Leg. The wound measures 2.2cm length x 1.2cm width x 0.3cm depth; 2.073cm^2 area  and 0.622cm^3 volume. There is Fat Layer (Subcutaneous Tissue) Exposed exposed. There is no tunneling or undermining noted. There is a large amount of serous drainage noted. The wound margin is flat and intact. There is small (1-33%) red, pink granulation within the wound bed. There is a large (67-100%) amount of necrotic tissue within the wound bed including Adherent Slough. The periwound skin appearance exhibited: Scarring, Maceration. The periwound skin appearance did not exhibit: Callus, Crepitus, Excoriation, Induration, Rash, Dry/Scaly, Atrophie Blanche, Cyanosis, Ecchymosis, Hemosiderin Staining, Mottled, Pallor, Rubor, Erythema. Periwound temperature was noted as No Abnormality. The periwound has tenderness on palpation. Assessment Active Problems ICD-10 L97.223 - 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 muscle I87.312 - Chronic venous hypertension (idiopathic) with ulcer of left lower extremity M05.80 - Other rheumatoid arthritis with rheumatoid factor of unspecified site SHADAY, RAYBORN ([id number]) Procedures Wound #1 Pre-procedure diagnosis of Wound #1 is a Venous Leg Ulcer located on the Left,Medial Lower Leg .Severity of Tissue Pre Debridement is: Fat layer exposed. There was a Excisional  Skin/Subcutaneous Tissue Debridement with a total area of 2.64 sq cm performed by Maxwell Caul, MD. With the following instrument(s): Curette. to remove Viable and Non-Viable tissue/material Material removed includes Subcutaneous Tissue, and Slough, Fibrin/Exudate, and [place] after achieving pain control using Lidocaine 4% Topical Solution. No specimens were taken. A time out was conducted at 14:36, prior to the start of the procedure. A Minimum amount of bleeding was controlled with Pressure. The procedure was tolerated well with a pain level of 0 throughout and a pain level of 0 following the procedure. Post Debridement Measurements: 2.2cm length x 1.2cm width x 0.4cm depth; 0.829cm^3 volume. Character of Wound/Ulcer Post Debridement is improved. Severity of Tissue Post Debridement is: Fat layer exposed. Post procedure Diagnosis Wound #1: Same as Pre-Procedure Plan Wound Cleansing: Wound #1 Left,Medial Lower Leg: Cleanse wound with mild soap and water Anesthetic (add to Medication List): Wound #1 Left,Medial Lower Leg: Topical Lidocaine 4% cream applied to wound bed prior to debridement (In Clinic Only). Skin Barriers/Peri-Wound Care: Wound #1 Left,Medial Lower Leg: Barrier cream Moisturizing lotion Primary Wound Dressing: Wound #1 Left,Medial Lower Leg: Silver Collagen - with Hydrogel Secondary Dressing: Wound #1 Left,Medial Lower Leg: Boardered Foam Dressing Dressing Change Frequency: Wound #1 Left,Medial Lower Leg: Change dressing every other day. Follow-up Appointments: Wound #1 Left,Medial Lower Leg: Return Appointment in 1 week. Edema Control: Wound #1 Left,Medial Lower Leg: Patient to wear own compression stockings Elevate legs to the level of the heart and pump ankles as often as possible Home Health: Wound #1 Left,Medial Lower Leg: Continue Home Health Visits Home Health Nurse may visit PRN to address patient s wound care needs. FACE TO FACE ENCOUNTER: MEDICARE  and MEDICAID PATIENTS: I certify that this patient is under my care and that I had a face-to-face encounter that meets the physician face-to-face encounter requirements with this patient on this date. The encounter with the patient was in whole or in part for the following MEDICAL CONDITION: (primary reason for Home Healthcare) MEDICAL NECESSITY: I certify, that based on my findings, NURSING services are a medically necessary home CHANON, LONEY ([id number]) health service. HOME BOUND STATUS: I certify that my clinical findings support that this patient is homebound (i.e., Due to illness or injury, pt requires aid of supportive devices such as crutches, cane, wheelchairs, walkers, the use of special transportation or the assistance of another person to leave their place of residence. There is a normal inability to leave the  home and doing so requires considerable and taxing effort. Other absences are for medical reasons / religious services and are infrequent or of short duration when for other reasons). If current dressing causes regression in wound condition, may D/C ordered dressing product/s and apply Normal Saline Moist Dressing daily until next Wound Healing Center / Other MD appointment. Notify Wound Healing Center of regression in wound condition at [phone]. Please direct any NON-WOUND related issues/requests for orders to patient's Primary Care Physician #1sober collagen with hydrogel to continue #2 border foam dressing #3 was a bit disappointed about the debridement today however post debridement the wound looked very healthy. Looking for dimension changes next week Electronic Signature(s) Signed: 11/23/2017 4:28:35 PM By: Baltazar Najjar MD Entered By: Baltazar Najjar on 11/23/2017 15:47:05 Zoe Robinson ([id number]) -------------------------------------------------------------------------------- SuperBill Details Patient Name: Zoe Robinson Date of Service:  11/23/2017 Medical Record Number: [id number] Patient Account Number: [id number] Date of Birth/Sex: April 30, 1932 (85 y.o. 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

## 2017-11-30 ENCOUNTER — Encounter: Payer: Medicare Other | Admitting: Internal Medicine

## 2017-11-30 DIAGNOSIS — E11622 Type 2 diabetes mellitus with other skin ulcer: Secondary | ICD-10-CM | POA: Diagnosis not present

## 2017-12-03 NOTE — Progress Notes (Signed)
ERINN, SCHORER ([id number]) Visit Report for 11/30/2017 HPI Details Patient Name: Zoe Robinson, Zoe Robinson Date of Service: 11/30/2017 2:30 PM Medical Record Number: [id number] Patient Account Number: [id number] Date of Birth/Sex: [birth year] (85 y.o. F) Treating RN: Huel Coventry Primary Care Provider: Gabriel Cirri Other Clinician: Referring Provider: Gabriel Cirri Treating Provider/Extender: Altamese [place] in Treatment: 12 History of Present Illness HPI Description: 09/07/17; this is an 83 year old woman who arrives accompanied by her daughter. She has a history of severe rheumatoid arthritis followed by Dr. Corliss Skains in [place]. By review of care everywhere it appears that she had cellulitis of her left lower leg in September 2018. This may have been caused by a dog scratch. She was followed and her primary care office at [facility] affiliated group. She received antibiotics, Silvadene cream as well as Una boots. This was followed through late October and the wound was healed out on 10/31. The patient and her daughters state that the current wound started with a scissor injury to the medial left calf before the wound was actually healed out in other words this is a different wound area. The original wound was more medial on the left anterior tibial area. Since then they've been using Silvadene cream and apparently she changed doctors who recommended Polysporin and they've been referred here for the injury on her left medial calf. She does not have a history of rheumatoid-related skin issues/vasculitis The patient is a type II diabetic with most recent hemoglobin A1c of 6. She has advanced seropositive rheumatoid arthritis on prednisone 10 mg alternating with 5 and methotrexate. I see she is also on xarelto while Im not really sure why. She has significant osteoporosis, history of hypercalcemia. ABI in this clinic is 1.08. She was a former smoker 09/14/17; patient's wound slightly smaller today  however still covered in a very thick adherent necrotic surface. We have been using Santyl under compression. We finally got home health to start yesterday "advanced" 09/21/17; not too much change in the area here. We have been using Santyl under compression and I'll continue this after debridement today. Consider Iodoflex if we don't get a better looking wound surface 09/22/17-she presents today as an urgent follow-up secondary to uncontrolled bleeding overnight. She has saturated through the 3-layer compression. Upon dressing removal there is significant clot formation, she continues to bleed from the most proximal aspect of the ulcer. 4 silver nitrate sticks were used to try to obtain hemostasis, along with 10 minutes of pressure dressing. The wound continues to bleed although not as briskly; this appears to be more of a venous bleed and arterial. She is hemodynamically stable upon presentation to the clinic SBP 130's, HR 80's with no complaints of dizziness, lightheadedness. We are unable to obtain any hemostatic agents from the emergency department. Since she has bled through the pressure dressing and continues to bleed despite all efforts available in the clinic she has been sent to the emergency department for evaluation and treatment 09/28/17; the patient has not had an easy time since the last time I saw her. She continues to have nonviable surface over the wound perhaps slightly better using Santyl. She has on xarelto which no doubt contributes to the bleeding after debridement. I'm going to change her to Iodoflex today. She is having a lot of discomfort with this we gave her tramadol which is reasonable 10/05/17;patient continues to have nonviable surface over a very difficult wound.we also note that her propensity to bleed. We have been using Iodoflex. previously minimal response previously  to [facility]. She tells me that her rheumatologist is made adjustments to both her prednisone and  methotrexate in attempt to help heal this difficult area. One would wonder if this is all venous insufficiency and whether the rheumatoid arthritis itself could be contributing to an inflammatory ulcer/rheumatoid vasculitis 10/12/17 She is here in follow up evaluation for a left lower extremity ulcer. There is no improvement. We will transition to santyl. 10/19/17; she has transitioned back to santyl and has been changing this every day. There has been some improvement although still requiring mechanical debridement 10/25/17; still using Santyl on the area on the left lower extremity. There is improvement for the first time. Recently no debridement was felt to be necessary.she did not respond well to Iodoflex. Request for fThera skin was denied by her insurance Zoe Robinson, Zoe Robinson ([id number]) 11/02/17; still using Santyl. She did not respond well to Iodoflex. We are getting a much better surface that this deep wound. Still requiring debridement.there is no advanced treatment option through her primary insurance [[employer]] 11/09/17; we are still using Santyl. Better looking surface still requiring mechanical debridement. There is on the left medial lower extremity.this initially started at the time of cellulitis in the left leg which may have been caused by a dog scratch. Had a lot of trouble getting in adequate surface on this wound. We did use Iodoflex for a period of time. we have not been able to get a proper surface to consider an alternative dressing 11/16/17; we finally have this wound down to a healthy granulated surface. We can graduate from [facility] to silver collagen. I have again reviewed the history of this apparently the patient had a scratch injury in the lateral aspect of her leg. In the course of wrapping this this was apparently a scissor injury at her primary's office. I'm he got this wrong and the original history 11/23/17; switch to Santyl collagen last week. Arrives today with some slough  over the surface over this represents an easy debridement. No change in dimensions. This was a scissor injury initially. 11/29/17; using collagen since last week. Wound surface is vibrant today. No change in dimensions however Electronic Signature(s) Signed: 12/01/2017 1:16:01 PM By: Baltazar Najjar MD Entered By: Baltazar Najjar on 11/30/2017 16:29:52 Zoe Robinson ([id number]) -------------------------------------------------------------------------------- Physical Exam Details Patient Name: Zoe Robinson Date of Service: 11/30/2017 2:30 PM Medical Record Number: [id number] Patient Account Number: [id number] Date of Birth/Sex: 1932/02/10 (85 y.o. F) Treating RN: Huel Coventry Primary Care Provider: Gabriel Cirri Other Clinician: Referring Provider: Gabriel Cirri Treating Provider/Extender: Altamese [place] in Treatment: 12 Constitutional Patient is hypertensive.. Pulse regular and within target range for patient.Marland Kitchen Respirations regular, non-labored and within target range.. Temperature is normal and within the target range for the patient.Marland Kitchen appears in no distress. Eyes Conjunctivae clear. No discharge. Respiratory Respiratory effort is easy and symmetric bilaterally. Rate is normal at rest and on room air.. Cardiovascular pedal pulses palpable. edema is well-controlled. Integumentary (Hair, Skin) chronic venous insufficiency but no other skin changes. Psychiatric No evidence of depression, anxiety, or agitation. Calm, cooperative, and communicative. Appropriate interactions and affect.. Notes 11/30/17; patient's wound did not require debridement. Surface of the wound looks fibrin. Unfortunately no changes in dimension. There is a slightly hyper-granulated area but had been leaving this in place. Electronic Signature(s) Signed: 12/01/2017 1:16:01 PM By: Baltazar Najjar MD Entered By: Baltazar Najjar on 11/30/2017 16:31:37 Zoe Robinson  ([id number]) -------------------------------------------------------------------------------- Physician Orders Details Patient Name: Zoe Robinson Date of Service: 11/30/2017 2:30 PM Medical Record  Number: [id number] Patient Account Number: [id number] Date of Birth/Sex: March 18, 1932 (85 y.o. F) Treating RN: Huel Coventry Primary Care Provider: Gabriel Cirri Other Clinician: Referring Provider: Gabriel Cirri Treating Provider/Extender: Altamese [place] in Treatment: 12 Verbal / Phone Orders: No Diagnosis Coding Wound Cleansing Wound #1 Left,Medial Lower Leg o Cleanse wound with mild soap and water Anesthetic (add to Medication List) Wound #1 Left,Medial Lower Leg o Topical Lidocaine 4% cream applied to wound bed prior to debridement (In Clinic Only). Primary Wound Dressing Wound #1 Left,Medial Lower Leg o Silver Collagen - with Hydrogel Secondary Dressing Wound #1 Left,Medial Lower Leg o Boardered Foam Dressing Dressing Change Frequency Wound #1 Left,Medial Lower Leg o Change dressing every other day. Follow-up Appointments Wound #1 Left,Medial Lower Leg o Return Appointment in 1 week. Edema Control Wound #1 Left,Medial Lower Leg o Patient to wear own compression stockings o Elevate legs to the level of the heart and pump ankles as often as possible Home Health Wound #1 Left,Medial Lower Leg o Continue Home Health Visits o Home Health Nurse may visit PRN to address patientos wound care needs. o FACE TO FACE ENCOUNTER: MEDICARE and MEDICAID PATIENTS: I certify that this patient is under my care and that I had a face-to-face encounter that meets the physician face-to-face encounter requirements with this patient on this date. The encounter with the patient was in whole or in part for the following MEDICAL CONDITION: (primary reason for Home Healthcare) MEDICAL NECESSITY: I certify, that based on my findings, NURSING services are a medically  necessary home health service. HOME BOUND STATUS: I certify that my clinical findings support that this patient is homebound (i.e., Due to illness or injury, pt requires aid of supportive devices such as crutches, cane, wheelchairs, walkers, the use of special transportation or the assistance of another person to leave their place of residence. There is a normal inability to leave the home Zoe Robinson, Zoe Robinson ([id number]) and doing so requires considerable and taxing effort. Other absences are for medical reasons / religious services and are infrequent or of short duration when for other reasons). o If current dressing causes regression in wound condition, may D/C ordered dressing product/s and apply Normal Saline Moist Dressing daily until next Wound Healing Center / Other MD appointment. Notify Wound Healing Center of regression in wound condition at [phone]. o Please direct any NON-WOUND related issues/requests for orders to patient's Primary Care Physician Electronic Signature(s) Signed: 11/30/2017 5:47:31 PM By: Elliot Gurney, BSN, RN, CWS, Kim RN, BSN Signed: 12/01/2017 1:16:01 PM By: Baltazar Najjar MD Entered By: Elliot Gurney, BSN, RN, CWS, Kim on 11/30/2017 15:13:07 Zoe Robinson ([id number]) -------------------------------------------------------------------------------- Problem List Details Patient Name: Zoe Robinson Date of Service: 11/30/2017 2:30 PM Medical Record Number: [id number] Patient Account Number: [id number] Date of Birth/Sex: [birth year] (85 y.o. 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 09/07/2017 Yes I87.312 Chronic venous hypertension (idiopathic) with ulcer  of left 09/07/2017 Yes lower extremity M05.80 Other rheumatoid arthritis with rheumatoid factor of 09/07/2017 Yes unspecified site Inactive Problems Resolved Problems Electronic Signature(s) Signed: 12/01/2017 1:16:01 PM By: Baltazar Najjar MD Entered By: Baltazar Najjar on 11/30/2017 16:25:53 Zoe Robinson ([id number]) -------------------------------------------------------------------------------- Progress Note Details Patient Name: Zoe Robinson Date of Service: 11/30/2017 2:30 PM Medical Record Number: [id number] Patient Account Number: [id number] Date  of Birth/Sex: June 17, 1932 (85 y.o. F) Treating RN: Huel Coventry Primary Care Provider: Gabriel Cirri Other Clinician: Referring Provider: Gabriel Cirri Treating Provider/Extender: Altamese [place] in Treatment: 12 Subjective History of Present Illness (HPI) 09/07/17; this is an 84 year old woman who arrives accompanied by her daughter. She has a history of severe rheumatoid arthritis followed by Dr. Corliss Skains in [place]. By review of care everywhere it appears that she had cellulitis of her left lower leg in September 2018. This may have been caused by a dog scratch. She was followed and her primary care office at [facility] affiliated group. She received antibiotics, Silvadene cream as well as Una boots. This was followed through late October and the wound was healed out on 10/31. The patient and her daughters state that the current wound started with a scissor injury to the medial left calf before the wound was actually healed out in other words this is a different wound area. The original wound was more medial on the left anterior tibial area. Since then they've been using Silvadene cream and apparently she changed doctors who recommended Polysporin and they've been referred here for the injury on her left medial calf. She does not have a history of rheumatoid-related skin issues/vasculitis The patient is a type II diabetic  with most recent hemoglobin A1c of 6. She has advanced seropositive rheumatoid arthritis on prednisone 10 mg alternating with 5 and methotrexate. I see she is also on xarelto while Im not really sure why. She has significant osteoporosis, history of hypercalcemia. ABI in this clinic is 1.08. She was a former smoker 09/14/17; patient's wound slightly smaller today however still covered in a very thick adherent necrotic surface. We have been using Santyl under compression. We finally got home health to start yesterday "advanced" 09/21/17; not too much change in the area here. We have been using Santyl under compression and I'll continue this after debridement today. Consider Iodoflex if we don't get a better looking wound surface 09/22/17-she presents today as an urgent follow-up secondary to uncontrolled bleeding overnight. She has saturated through the 3-layer compression. Upon dressing removal there is significant clot formation, she continues to bleed from the most proximal aspect of the ulcer. 4 silver nitrate sticks were used to try to obtain hemostasis, along with 10 minutes of pressure dressing. The wound continues to bleed although not as briskly; this appears to be more of a venous bleed and arterial. She is hemodynamically stable upon presentation to the clinic SBP 130's, HR 80's with no complaints of dizziness, lightheadedness. We are unable to obtain any hemostatic agents from the emergency department. Since she has bled through the pressure dressing and continues to bleed despite all efforts available in the clinic she has been sent to the emergency department for evaluation and treatment 09/28/17; the patient has not had an easy time since the last time I saw her. She continues to have nonviable surface over the wound perhaps slightly better using Santyl. She has on xarelto which no doubt contributes to the bleeding after debridement. I'm going to change her to Iodoflex today. She is having  a lot of discomfort with this we gave her tramadol which is reasonable 10/05/17;patient continues to have nonviable surface over a very difficult wound.we also note that her propensity to bleed. We have been using Iodoflex. previously minimal response previously to [employer]. She tells me that her rheumatologist is made adjustments to both her prednisone and methotrexate in attempt to help heal this difficult area. One would  wonder if this is all venous insufficiency and whether the rheumatoid arthritis itself could be contributing to an inflammatory ulcer/rheumatoid vasculitis 10/12/17 She is here in follow up evaluation for a left lower extremity ulcer. There is no improvement. We will transition to santyl. 10/19/17; she has transitioned back to santyl and has been changing this every day. There has been some improvement although still requiring mechanical debridement 10/25/17; still using Santyl on the area on the left lower extremity. There is improvement for the first time. Recently no debridement was felt to be necessary.she did not respond well to Iodoflex. Request for fThera skin was denied by her insurance 11/02/17; still using Santyl. She did not respond well to Iodoflex. We are getting a much better surface that this deep wound. Still requiring debridement.there is no advanced treatment option through her primary insurance Zoe Robinson, Zoe Robinson ([id number]) 11/09/17; we are still using Santyl. Better looking surface still requiring mechanical debridement. There is on the left medial lower extremity.this initially started at the time of cellulitis in the left leg which may have been caused by a dog scratch. Had a lot of trouble getting in adequate surface on this wound. We did use Iodoflex for a period of time. we have not been able to get a proper surface to consider an alternative dressing 11/16/17; we finally have this wound down to a healthy granulated surface. We can graduate from [facility]  to silver collagen. I have again reviewed the history of this apparently the patient had a scratch injury in the lateral aspect of her leg. In the course of wrapping this this was apparently a scissor injury at her primary's office. I'm he got this wrong and the original history 11/23/17; switch to Santyl collagen last week. Arrives today with some slough over the surface over this represents an easy debridement. No change in dimensions. This was a scissor injury initially. 11/29/17; using collagen since last week. Wound surface is vibrant today. No change in dimensions however Objective Constitutional Patient is hypertensive.. Pulse regular and within target range for patient.Marland Kitchen Respirations regular, non-labored and within target range.. Temperature is normal and within the target range for the patient.Marland Kitchen appears in no distress. Vitals Time Taken: 2:49 PM, Height: 59 in, Weight: 117 lbs, BMI: 23.6, Temperature: 98.5 F, Pulse: 78 bpm, Respiratory Rate: 16 breaths/min, Blood Pressure: 154/66 mmHg. Eyes Conjunctivae clear. No discharge. Respiratory Respiratory effort is easy and symmetric bilaterally. Rate is normal at rest and on room air.. Cardiovascular pedal pulses palpable. edema is well-controlled. Psychiatric No evidence of depression, anxiety, or agitation. Calm, cooperative, and communicative. Appropriate interactions and affect.. General Notes: 11/30/17; patient's wound did not require debridement. Surface of the wound looks fibrin. Unfortunately no changes in dimension. There is a slightly hyper-granulated area but had been leaving this in place. Integumentary (Hair, Skin) chronic venous insufficiency but no other skin changes. Wound #1 status is Open. Original cause of wound was Trauma. The wound is located on the Left,Medial Lower Leg. The wound measures 2.2cm length x 1.2cm width x 0.2cm depth; 2.073cm^2 area and 0.415cm^3 volume. There is Fat Layer (Subcutaneous Tissue) Exposed  exposed. There is no tunneling or undermining noted. There is a medium amount of serous drainage noted. The wound margin is flat and intact. There is medium (34-66%) red, pink, hyper - granulation within the wound bed. There is a medium (34-66%) amount of necrotic tissue within the wound bed including Adherent Slough. The periwound skin appearance did not exhibit: Callus, Crepitus, Excoriation, Induration, Rash,  Scarring, Dry/Scaly, Maceration, Atrophie Blanche, Cyanosis, Ecchymosis, Hemosiderin Staining, Mottled, Pallor, Rubor, Erythema. Periwound temperature was noted as No Abnormality. Zoe Robinson, Zoe Robinson ([id number]) Assessment Active Problems ICD-10 [phone] - 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 muscle I87.312 - Chronic venous hypertension (idiopathic) with ulcer of left lower extremity M05.80 - Other rheumatoid arthritis with rheumatoid factor of unspecified site Plan Wound Cleansing: Wound #1 Left,Medial Lower Leg: Cleanse wound with mild soap and water Anesthetic (add to Medication List): Wound #1 Left,Medial Lower Leg: Topical Lidocaine 4% cream applied to wound bed prior to debridement (In Clinic Only). Primary Wound Dressing: Wound #1 Left,Medial Lower Leg: Silver Collagen - with Hydrogel Secondary Dressing: Wound #1 Left,Medial Lower Leg: Boardered Foam Dressing Dressing Change Frequency: Wound #1 Left,Medial Lower Leg: Change dressing every other day. Follow-up Appointments: Wound #1 Left,Medial Lower Leg: Return Appointment in 1 week. Edema Control: Wound #1 Left,Medial Lower Leg: Patient to wear own compression stockings Elevate legs to the level of the heart and pump ankles as often as possible Home Health: Wound #1 Left,Medial Lower Leg: Continue Home Health Visits Home Health Nurse may visit PRN to address patient s wound care needs. FACE TO FACE ENCOUNTER: MEDICARE and MEDICAID PATIENTS: I certify that this patient is under my care and that  I had a face-to-face encounter that meets the physician face-to-face encounter requirements with this patient on this date. The encounter with the patient was in whole or in part for the following MEDICAL CONDITION: (primary reason for Home Healthcare) MEDICAL NECESSITY: I certify, that based on my findings, NURSING services are a medically necessary home health service. HOME BOUND STATUS: I certify that my clinical findings support that this patient is homebound (i.e., Due to illness or injury, pt requires aid of supportive devices such as crutches, cane, wheelchairs, walkers, the use of special transportation or the assistance of another person to leave their place of residence. There is a normal inability to leave the home and doing so requires considerable and taxing effort. Other absences are for medical reasons / religious services and are infrequent or of short duration when for other reasons). If current dressing causes regression in wound condition, may D/C ordered dressing product/s and apply Normal Saline Moist Dressing daily until next Wound Healing Center / Other MD appointment. Notify Wound Healing Center of regression in wound condition at [phone]. Please direct any NON-WOUND related issues/requests for orders to patient's Primary Care Physician Zoe Robinson, Zoe Robinson ([id number]) #1Silver collagen with hydrogel border foam. If this is not measurably better next week consider Hydrofera Blue. Electronic Signature(s) Signed: 12/01/2017 1:16:01 PM By: Baltazar Najjar MD Entered By: Baltazar Najjar on 11/30/2017 16:32:25 Zoe Robinson ([id number]) -------------------------------------------------------------------------------- SuperBill Details Patient Name: Zoe Robinson Date of Service: 11/30/2017 Medical Record Number: [id number] Patient Account Number: [id number] Date of Birth/Sex: 05-09-1932 (85 y.o. F) Treating RN: Huel Coventry Primary Care Provider: Gabriel Cirri Other  Clinician: Referring Provider: Gabriel Cirri Treating Provider/Extender: Altamese  in Treatment: 12 Diagnosis Coding ICD-10 Codes Code Description [phone] 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 muscle I87.312 Chronic venous hypertension (idiopathic) with ulcer of left lower extremity M05.80 Other rheumatoid arthritis with rheumatoid factor of unspecified site Facility Procedures CPT4 Code: [id number] Description: [phone] - WOUND CARE VISIT-LEV 2 EST PT Modifier: Quantity: 1 Physician Procedures CPT4 Code: [id number] Description: 99213 - WC PHYS LEVEL 3 - EST PT ICD-10 Diagnosis Description L97.223 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 musc I87.312 Chronic venous hypertension (idiopathic) with ulcer  of left l Modifier: le ower extremity Quantity: 1 Electronic Signature(s) Signed: 12/01/2017 1:16:01 PM By: Baltazar Najjar MD Entered By: Baltazar Najjar on 11/30/2017 16:32:58

## 2017-12-03 NOTE — Progress Notes (Signed)
Zoe Robinson ([id number]) Visit Report for 11/30/2017 Arrival Information Details Patient Name: Zoe Robinson Date of Service: 11/30/2017 2:30 PM Medical Record Number: [id number] Patient Account Number: [id number] Date of Birth/Sex: [birth year] (85 y.o. F) Treating RN: Renne Crigler Primary Care Zoe Robinson: Zoe Robinson Other Clinician: Referring Zoe Robinson: Zoe Robinson Treating Joshalyn Ancheta/Extender: Altamese [place] in Treatment: 12 Visit Information History Since Last Visit All ordered tests and consults were completed: No Patient Arrived: Ambulatory Added or deleted any medications: No Arrival Time: 14:45 Any new allergies or adverse reactions: No Accompanied By: daughter Had a fall or experienced change in No Transfer Assistance: None activities of daily living that may affect Patient Identification Verified: Yes risk of falls: Secondary Verification Process Yes Signs or symptoms of abuse/neglect since last visito No Completed: Hospitalized since last visit: No Patient Requires Transmission-Based No Implantable device outside of the clinic excluding No Precautions: cellular tissue based products placed in the center Patient Has Alerts: Yes since last visit: Patient Alerts: Patient on Blood Pain Present Now: Yes Thinner xarelto Electronic Signature(s) Signed: 11/30/2017 4:46:18 PM By: Renne Crigler Entered By: Renne Crigler on 11/30/2017 14:47:29 Zoe Robinson ([id number]) -------------------------------------------------------------------------------- Clinic Level of Care Assessment Details Patient Name: Zoe Robinson Date of Service: 11/30/2017 2:30 PM Medical Record Number: [id number] Patient Account Number: [id number] Date of Birth/Sex: 02-May-1932 (85 y.o. F) Treating RN: Zoe Robinson Primary Care Aizah Gehlhausen: Zoe Robinson Other Clinician: Referring Ayson Cherubini: Zoe Robinson Treating Kiran Lapine/Extender: Altamese [place] in Treatment:  12 Clinic Level of Care Assessment Items TOOL 4 Quantity Score []  - Use when only an EandM is performed on FOLLOW-UP visit 0 ASSESSMENTS - Nursing Assessment / Reassessment []  - Reassessment of Co-morbidities (includes updates in patient status) 0 X- 1 5 Reassessment of Adherence to Treatment Plan ASSESSMENTS - Wound and Skin Assessment / Reassessment X - Simple Wound Assessment / Reassessment - one wound 1 5 []  - 0 Complex Wound Assessment / Reassessment - multiple wounds []  - 0 Dermatologic / Skin Assessment (not related to wound area) ASSESSMENTS - Focused Assessment []  - Circumferential Edema Measurements - multi extremities 0 []  - 0 Nutritional Assessment / Counseling / Intervention []  - 0 Lower Extremity Assessment (monofilament, tuning fork, pulses) []  - 0 Peripheral Arterial Disease Assessment (using hand held doppler) ASSESSMENTS - Ostomy and/or Continence Assessment and Care []  - Incontinence Assessment and Management 0 []  - 0 Ostomy Care Assessment and Management (repouching, etc.) PROCESS - Coordination of Care X - Simple Patient / Family Education for ongoing care 1 15 []  - 0 Complex (extensive) Patient / Family Education for ongoing care []  - 0 Staff obtains Chiropractor, Records, Test Results / Process Orders []  - 0 Staff telephones HHA, Nursing Homes / Clarify orders / etc []  - 0 Routine Transfer to another Facility (non-emergent condition) []  - 0 Routine Hospital Admission (non-emergent condition) []  - 0 New Admissions / Manufacturing engineer / Ordering NPWT, Apligraf, etc. []  - 0 Emergency Hospital Admission (emergent condition) X- 1 10 Simple Discharge Coordination Zoe, Robinson ([id number]) []  - 0 Complex (extensive) Discharge Coordination PROCESS - Special Needs []  - Pediatric / Minor Patient Management 0 []  - 0 Isolation Patient Management []  - 0 Hearing / Language / Visual special needs []  - 0 Assessment of Community assistance  (transportation, D/C planning, etc.) []  - 0 Additional assistance / Altered mentation []  - 0 Support Surface(s) Assessment (bed, cushion, seat, etc.) INTERVENTIONS - Wound Cleansing / Measurement X - Simple Wound Cleansing - one wound 1 5 []  - 0  Complex Wound Cleansing - multiple wounds X- 1 5 Wound Imaging (photographs - any number of wounds) []  - 0 Wound Tracing (instead of photographs) X- 1 5 Simple Wound Measurement - one wound []  - 0 Complex Wound Measurement - multiple wounds INTERVENTIONS - Wound Dressings []  - Small Wound Dressing one or multiple wounds 0 X- 1 15 Medium Wound Dressing one or multiple wounds []  - 0 Large Wound Dressing one or multiple wounds []  - 0 Application of Medications - topical []  - 0 Application of Medications - injection INTERVENTIONS - Miscellaneous []  - External ear exam 0 []  - 0 Specimen Collection (cultures, biopsies, blood, body fluids, etc.) []  - 0 Specimen(s) / Culture(s) sent or taken to Lab for analysis []  - 0 Patient Transfer (multiple staff / / Similar devices) []  - 0 Simple Staple / Suture removal (25 or less) []  - 0 Complex Staple / Suture removal (26 or more) []  - 0 Hypo / Hyperglycemic Management (close monitor of Blood Glucose) []  - 0 Ankle / Brachial Index (ABI) - do not check if billed separately X- 1 5 Vital Signs Zoe Robinson ( ) Has the patient been seen at the hospital within the last three years: Yes Total Score: 70 Level Of Care: New/Established - Level 2 Electronic Signature(s) Signed: 11/30/2017 5:47:31 PM By: , BSN, RN, CWS, Kim RN, BSN Entered By: , BSN, RN, CWS, Kim on 11/30/2017 15:13:32 ( ) -------------------------------------------------------------------------------- Encounter Discharge Information Details Patient Name: Date of Service: 11/30/2017 2:30 PM Medical Record Number: Nurse, adult Patient Account Number:  Date of Birth/Sex: 1931-11-26 (85 y.o. F) Treating RN: Primary Care Tiye Huwe: [id number] Other Clinician: Referring Bexleigh Theriault: 01/30/2018 Treating Sheddrick Lattanzio/Extender: Elliot Gurney in Treatment: 12 Encounter Discharge Information Items Discharge Condition: Stable Ambulatory Status: Ambulatory Discharge Destination: Home Transportation: Private Auto Accompanied By: dtr Schedule Follow-up Appointment: Yes Clinical Summary of Care: Electronic Signature(s) Signed: 12/01/2017 4:34:09 PM By: 01/30/2018 Entered By: Zoe Robinson on 11/30/2017 15:23:31 Zoe Robinson (01/30/2018) -------------------------------------------------------------------------------- Lower Extremity Assessment Details Patient Name: [id number] Date of Service: 11/30/2017 2:30 PM Medical Record Number: 05/16/1932 Patient Account Number: [id number] Date of Birth/Sex: [birth year] (85 y.o. F) Treating RN: Zoe Robinson Primary Care Dary Dilauro: Altamese [place] Other Clinician: Referring Yaroslav Gombos: 01/31/2018 Treating Arbell Wycoff/Extender: Curtis [place] in Treatment: 12 Vascular Assessment Pulses: Dorsalis Pedis Palpable: [Left:Yes] Posterior Tibial Extremity colors, hair growth, and conditions: Extremity Color: [Left:Normal] Hair Growth on Extremity: [Left:No] Temperature of Extremity: [Left:Warm] Capillary Refill: [Left:< 3 seconds] Dependent Rubor: [Left:No] Blanched when Elevated: [Left:No] Lipodermatosclerosis: [Left:No] Toe Nail Assessment Left: Right: Thick: Yes Discolored: Yes Deformed: Yes Improper Length and Hygiene: Yes Electronic Signature(s) Signed: 11/30/2017 4:46:18 PM By: 01/30/2018 Entered By: Zoe Robinson on 11/30/2017 14:52:40 Zoe Robinson (01/30/2018) -------------------------------------------------------------------------------- Multi Wound Chart Details Patient Name: [id number] Date of Service:  11/30/2017 2:30 PM Medical Record Number: 05/16/1932 Patient Account Number: [id number] Date of Birth/Sex: 1932/01/20 (85 y.o. F) Treating RN: Zoe Robinson Primary Care [place] Pechacek: Altamese [place] Other Clinician: Referring Donnika Kucher: 01/30/2018 Treating Doloras [place]/Extender: Renne Crigler in Treatment: 12 Vital Signs Height(in): 59 Pulse(bpm): 78 Weight(lbs): 117 Blood Pressure(mmHg): 154/66 Body Mass Index(BMI): 24 Temperature(F): 98.5 Respiratory Rate 16 (breaths/min): Photos: [1:No Photos] [N/A:N/A] Wound Location: [1:Left Lower Leg - Medial] [N/A:N/A] Wounding Event: [1:Trauma] [N/A:N/A] Primary Etiology: [1:Venous Leg Ulcer] [N/A:N/A] Comorbid History: [1:Cataracts, Anemia, Arrhythmia, Hypertension, Rheumatoid Arthritis, Osteoarthritis] [N/A:N/A] Date Acquired: [1:05/02/2017] [N/A:N/A] Weeks of Treatment: [1:12] [N/A:N/A] Wound Status: [1:Open] [N/A:N/A] Measurements  L x W x D [1:2.2x1.2x0.2] [N/A:N/A] (cm) Area (cm) : [1:2.073] [N/A:N/A] Volume (cm) : [1:0.415] [N/A:N/A] % Reduction in Area: [1:72.60%] [N/A:N/A] % Reduction in Volume: [1:45.10%] [N/A:N/A] Classification: [1:Full Thickness Without Exposed Support Structures] [N/A:N/A] Exudate Amount: [1:Medium] [N/A:N/A] Exudate Type: [1:Serous] [N/A:N/A] Exudate Color: [1:amber] [N/A:N/A] Wound Margin: [1:Flat and Intact] [N/A:N/A] Granulation Amount: [1:Medium (34-66%)] [N/A:N/A] Granulation Quality: [1:Red, Pink, Hyper-granulation] [N/A:N/A] Necrotic Amount: [1:Medium (34-66%)] [N/A:N/A] Exposed Structures: [1:Fat Layer (Subcutaneous Tissue) Exposed: Yes Fascia: No Tendon: No Muscle: No Joint: No Bone: No] [N/A:N/A] Epithelialization: [1:Small (1-33%)] [N/A:N/A] Periwound Skin Texture: [1:Excoriation: No Induration: No Callus: No] [N/A:N/A] Crepitus: No Rash: No Scarring: No Periwound Skin Moisture: Maceration: No N/A N/A Dry/Scaly: No Periwound Skin Color: Atrophie Blanche: No N/A N/A Cyanosis:  No Ecchymosis: No Erythema: No Hemosiderin Staining: No Mottled: No Pallor: No Rubor: No Temperature: No Abnormality N/A N/A Tenderness on Palpation: No N/A N/A Wound Preparation: Ulcer Cleansing: N/A N/A Rinsed/Irrigated with Saline Topical Anesthetic Applied: Other: lidocaine 4% Treatment Notes Wound #1 (Left, Medial Lower Leg) 1. Cleansed with: Clean wound with Normal Saline 2. Anesthetic Topical Lidocaine 4% cream to wound bed prior to debridement 4. Dressing Applied: Hydrogel Prisma Ag 5. Secondary Dressing Applied Bordered Foam Dressing Electronic Signature(s) Signed: 12/01/2017 1:16:01 PM By: Baltazar Najjar MD Entered By: Baltazar Najjar on 11/30/2017 16:27:05 Zoe Robinson ([id number]) -------------------------------------------------------------------------------- Multi-Disciplinary Care Plan Details Patient Name: Zoe Robinson Date of Service: 11/30/2017 2:30 PM Medical Record Number: [id number] Patient Account Number: [id number] Date of Birth/Sex: 09-15-1931 (85 y.o. F) Treating RN: Zoe Robinson Primary Care Nole Robey: Zoe Robinson Other Clinician: Referring Shekera Beavers: Zoe Robinson Treating Raun Routh/Extender: Altamese [place] in Treatment: 12 Active Inactive ` Abuse / Safety / Falls / Self Care Management Nursing Diagnoses: Potential for falls Goals: Patient will not experience any injury related to falls Date Initiated: 09/07/2017 Target Resolution Date: 12/03/2017 Goal Status: Active Interventions: Assess fall risk on admission and as needed Notes: ` Orientation to the Wound Care Program Nursing Diagnoses: Knowledge deficit related to the wound healing center program Goals: Patient/caregiver will verbalize understanding of the Wound Healing Center Program Date Initiated: 09/07/2017 Target Resolution Date: 12/03/2017 Goal Status: Active Interventions: Provide education on orientation to the wound center Notes: ` Wound/Skin  Impairment Nursing Diagnoses: Impaired tissue integrity Goals: Ulcer/skin breakdown will heal within 14 weeks Date Initiated: 09/07/2017 Target Resolution Date: 12/03/2017 Goal Status: Active Interventions: GOHAR, MCKEAG ([id number]) Assess patient/caregiver ability to obtain necessary supplies Assess patient/caregiver ability to perform ulcer/skin care regimen upon admission and as needed Assess ulceration(s) every visit Notes: Electronic Signature(s) Signed: 11/30/2017 5:47:31 PM By: Elliot Gurney, BSN, RN, CWS, Kim RN, BSN Entered By: Elliot Gurney, BSN, RN, CWS, Kim on 11/30/2017 15:12:20 Zoe Robinson ([id number]) -------------------------------------------------------------------------------- Pain Assessment Details Patient Name: Zoe Robinson Date of Service: 11/30/2017 2:30 PM Medical Record Number: [id number] Patient Account Number: [id number] Date of Birth/Sex: 1932-05-20 (85 y.o. F) Treating RN: Renne Crigler Primary Care [place]: Zoe Robinson Other Clinician: Referring Lorane Cousar: Zoe Robinson Treating Corlis Angelica/Extender: Altamese [place] in Treatment: 12 Active Problems Location of Pain Severity and Description of Pain Patient Has Paino Yes Site Locations Pain Location: Pain in Ulcers Duration of the Pain. Constant / Intermittento Intermittent Rate the pain. Current Pain Level: 1 Worst Pain Level: 10 Least Pain Level: 0 Tolerable Pain Level: 6 Pain Management and Medication Current Pain Management: Notes stinging pain Electronic Signature(s) Signed: 11/30/2017 4:46:18 PM By: Renne Crigler Entered By: Renne Crigler on 11/30/2017 14:49:36 Zoe Robinson ([id number]) -------------------------------------------------------------------------------- Patient/Caregiver Education Details Patient Name:  Zoe Robinson Date of Service: 11/30/2017 2:30 PM Medical Record Number: [id number] Patient Account Number: [id number] Date of Birth/Gender: 1931/08/25  (85 y.o. F) Treating RN: Curtis Sites Primary Care Physician: Zoe Robinson Other Clinician: Referring Physician: Gabriel Robinson Treating Physician/Extender: Altamese [place] in Treatment: 12 Education Assessment Education Provided To: Patient Education Topics Provided Wound/Skin Impairment: Handouts: Other: wound care as ordered Methods: Demonstration, Explain/Verbal Responses: State content correctly Electronic Signature(s) Signed: 12/01/2017 4:34:09 PM By: Curtis Sites Entered By: Curtis Sites on 11/30/2017 15:24:10 Zoe Robinson ([id number]) -------------------------------------------------------------------------------- Wound Assessment Details Patient Name: Zoe Robinson Date of Service: 11/30/2017 2:30 PM Medical Record Number: [id number] Patient Account Number: [id number] Date of Birth/Sex: Aug 07, 1931 (85 y.o. F) Treating RN: Renne Crigler Primary Care Ketra Duchesne: Zoe Robinson Other Clinician: Referring Avir Deruiter: Zoe Robinson Treating Nyair Depaulo/Extender: Altamese [place] in Treatment: 12 Wound Status Wound Number: 1 Primary Venous Leg Ulcer Etiology: Wound Location: Left Lower Leg - Medial Wound Open Wounding Event: Trauma Status: Date Acquired: 05/02/2017 Comorbid Cataracts, Anemia, Arrhythmia, Hypertension, Weeks Of Treatment: 12 History: Rheumatoid Arthritis, Osteoarthritis Clustered Wound: No Photos Photo Uploaded By: Elliot Gurney, BSN, RN, CWS, Kim on 12/01/2017 15:45:39 Wound Measurements Length: (cm) 2.2 Width: (cm) 1.2 Depth: (cm) 0.2 Area: (cm) 2.073 Volume: (cm) 0.415 % Reduction in Area: 72.6% % Reduction in Volume: 45.1% Epithelialization: Small (1-33%) Tunneling: No Undermining: No Wound Description Full Thickness Without Exposed Support Classification: Structures Wound Margin: Flat and Intact Exudate Medium Amount: Exudate Type: Serous Exudate Color: amber Foul Odor After Cleansing: No Slough/Fibrino  Yes Wound Bed Granulation Amount: Medium (34-66%) Exposed Structure Granulation Quality: Red, Pink, Hyper-granulation Fascia Exposed: No Necrotic Amount: Medium (34-66%) Fat Layer (Subcutaneous Tissue) Exposed: Yes Necrotic Quality: Adherent Slough Tendon Exposed: No Muscle Exposed: No Joint Exposed: No Bone Exposed: No Zoe Robinson, Zoe Robinson ([id number]) Periwound Skin Texture Texture Color No Abnormalities Noted: No No Abnormalities Noted: No Callus: No Atrophie Blanche: No Crepitus: No Cyanosis: No Excoriation: No Ecchymosis: No Induration: No Erythema: No Rash: No Hemosiderin Staining: No Scarring: No Mottled: No Pallor: No Moisture Rubor: No No Abnormalities Noted: No Dry / Scaly: No Temperature / Pain Maceration: No Temperature: No Abnormality Wound Preparation Ulcer Cleansing: Rinsed/Irrigated with Saline Topical Anesthetic Applied: Other: lidocaine 4%, Treatment Notes Wound #1 (Left, Medial Lower Leg) 1. Cleansed with: Clean wound with Normal Saline 2. Anesthetic Topical Lidocaine 4% cream to wound bed prior to debridement 4. Dressing Applied: Hydrogel Prisma Ag 5. Secondary Dressing Applied Bordered Foam Dressing Electronic Signature(s) Signed: 11/30/2017 4:46:18 PM By: Renne Crigler Entered By: Renne Crigler on 11/30/2017 14:51:44 Zoe Robinson ([id number]) -------------------------------------------------------------------------------- Vitals Details Patient Name: Zoe Robinson Date of Service: 11/30/2017 2:30 PM Medical Record Number: [id number] Patient Account Number: [id number] Date of Birth/Sex: 08/28/1931 (85 y.o. F) Treating RN: Renne Crigler Primary Care Aoife Bold: Zoe Robinson Other Clinician: Referring Lenord Fralix: Zoe Robinson Treating Alexandar Weisenberger/Extender: Altamese [place] in Treatment: 12 Vital Signs Time Taken: 14:49 Temperature (F): 98.5 Height (in): 59 Pulse (bpm): 78 Weight (lbs): 117 Respiratory Rate  (breaths/min): 16 Body Mass Index (BMI): 23.6 Blood Pressure (mmHg): 154/66 Reference Range: 80 - 120 mg / dl Electronic Signature(s) Signed: 11/30/2017 4:46:18 PM By: Renne Crigler Entered By: Renne Crigler on 11/30/2017 14:49:59

## 2017-12-07 ENCOUNTER — Encounter: Payer: Medicare Other | Admitting: Internal Medicine

## 2017-12-07 DIAGNOSIS — E11622 Type 2 diabetes mellitus with other skin ulcer: Secondary | ICD-10-CM | POA: Diagnosis not present

## 2017-12-09 NOTE — Progress Notes (Signed)
Zoe Robinson, Zoe Robinson ([id number]) Visit Report for 12/07/2017 HPI Details Patient Name: Zoe Robinson, Zoe Robinson Date of Service: 12/07/2017 1:15 PM Medical Record Number: [id number] Patient Account Number: [id number] Date of Birth/Sex: [birth year] (84 y.o. F) Treating RN: Huel Coventry Primary Care Provider: Gabriel Cirri Other Clinician: Referring Provider: Gabriel Cirri Treating Provider/Extender: Altamese [place] in Treatment: 13 History of Present Illness HPI Description: 09/07/17; this is an 84 year old woman who arrives accompanied by her daughter. She has a history of severe rheumatoid arthritis followed by Dr. Corliss Skains in [place]. By review of care everywhere it appears that she had cellulitis of her left lower leg in September 2018. This may have been caused by a dog scratch. She was followed and her primary care office at [facility] affiliated group. She received antibiotics, Silvadene cream as well as Una boots. This was followed through late October and the wound was healed out on 10/31. The patient and her daughters state that the current wound started with a scissor injury to the medial left calf before the wound was actually healed out in other words this is a different wound area. The original wound was more medial on the left anterior tibial area. Since then they've been using Silvadene cream and apparently she changed doctors who recommended Polysporin and they've been referred here for the injury on her left medial calf. She does not have a history of rheumatoid-related skin issues/vasculitis The patient is a type II diabetic with most recent hemoglobin A1c of 6. She has advanced seropositive rheumatoid arthritis on prednisone 10 mg alternating with 5 and methotrexate. I see she is also on xarelto while Im not really sure why. She has significant osteoporosis, history of hypercalcemia. ABI in this clinic is 1.08. She was a former smoker 09/14/17; patient's wound slightly smaller  today however still covered in a very thick adherent necrotic surface. We have been using Santyl under compression. We finally got home health to start yesterday "advanced" 09/21/17; not too much change in the area here. We have been using Santyl under compression and I'll continue this after debridement today. Consider Iodoflex if we don't get a better looking wound surface 09/22/17-she presents today as an urgent follow-up secondary to uncontrolled bleeding overnight. She has saturated through the 3-layer compression. Upon dressing removal there is significant clot formation, she continues to bleed from the most proximal aspect of the ulcer. 4 silver nitrate sticks were used to try to obtain hemostasis, along with 10 minutes of pressure dressing. The wound continues to bleed although not as briskly; this appears to be more of a venous bleed and arterial. She is hemodynamically stable upon presentation to the clinic SBP 130's, HR 80's with no complaints of dizziness, lightheadedness. We are unable to obtain any hemostatic agents from the emergency department. Since she has bled through the pressure dressing and continues to bleed despite all efforts available in the clinic she has been sent to the emergency department for evaluation and treatment 09/28/17; the patient has not had an easy time since the last time I saw her. She continues to have nonviable surface over the wound perhaps slightly better using Santyl. She has on xarelto which no doubt contributes to the bleeding after debridement. I'm going to change her to Iodoflex today. She is having a lot of discomfort with this we gave her tramadol which is reasonable 10/05/17;patient continues to have nonviable surface over a very difficult wound.we also note that her propensity to bleed. We have been using Iodoflex. previously minimal response previously  to [facility] She tells me that her rheumatologist is made adjustments to both her prednisone and  methotrexate in attempt to help heal this difficult area. One would wonder if this is all venous insufficiency and whether the rheumatoid arthritis itself could be contributing to an inflammatory ulcer/rheumatoid vasculitis 10/12/17 She is here in follow up evaluation for a left lower extremity ulcer. There is no improvement. We will transition to santyl. 10/19/17; she has transitioned back to santyl and has been changing this every day. There has been some improvement although still requiring mechanical debridement 10/25/17; still using Santyl on the area on the left lower extremity. There is improvement for the first time. Recently no debridement was felt to be necessary.she did not respond well to Iodoflex. Request for fThera skin was denied by her insurance Zoe Robinson, Zoe Robinson ([id number]) 11/02/17; still using Santyl. She did not respond well to Iodoflex. We are getting a much better surface that this deep wound. Still requiring debridement.there is no advanced treatment option through her primary insurance [[employer]] 11/09/17; we are still using Santyl. Better looking surface still requiring mechanical debridement. There is on the left medial lower extremity.this initially started at the time of cellulitis in the left leg which may have been caused by a dog scratch. Had a lot of trouble getting in adequate surface on this wound. We did use Iodoflex for a period of time. we have not been able to get a proper surface to consider an alternative dressing 11/16/17; we finally have this wound down to a healthy granulated surface. We can graduate from [facility] to silver collagen. I have again reviewed the history of this apparently the patient had a scratch injury in the lateral aspect of her leg. In the course of wrapping this this was apparently a scissor injury at her primary's office. I'm he got this wrong and the original history 11/23/17; switch to Santyl collagen last week. Arrives today with some slough  over the surface over this represents an easy debridement. No change in dimensions. This was a scissor injury initially. 11/29/17; using collagen since last week. Wound surface is vibrant today. No change in dimensions however 12/07/17; wound is measuring smaller. Some surface left easily removed with gauze. Some hyper granulation generally the surface looks better.we have been using silver collagen I changed this to [employer]) Signed: 12/07/2017 4:50:03 PM By: Baltazar Najjar MD Entered By: Baltazar Najjar on 12/07/2017 13:35:16 Zoe Robinson ([id number]) -------------------------------------------------------------------------------- Physical Exam Details Patient Name: Zoe Robinson Date of Service: 12/07/2017 1:15 PM Medical Record Number: [id number] Patient Account Number: [id number] Date of Birth/Sex: 18-Jul-1932 (84 y.o. F) Treating RN: Huel Coventry Primary Care Provider: Gabriel Cirri Other Clinician: Referring Provider: Gabriel Cirri Treating Provider/Extender: Altamese [place] in Treatment: 13 Constitutional Sitting or standing Blood Pressure is within target range for patient.. Pulse regular and within target range for patient.Marland Kitchen Respirations regular, non-labored and within target range.. Temperature is normal and within the target range for the patient.Marland Kitchen appears in no distress. Eyes Conjunctivae clear. No discharge. Respiratory Respiratory effort is easy and symmetric bilaterally. Rate is normal at rest and on room air.. Cardiovascular Pedal pulses palpable and strong bilaterally.. Lymphatic none palpable in the popliteal or inguinal area. Integumentary (Hair, Skin) some chronic venous changes but no other major skin issues seen. Psychiatric No evidence of depression, anxiety, or agitation. Calm, cooperative, and communicative. Appropriate interactions and affect.. Notes wound exam; the patient's wound did not require debridement.  Surface of the wound is well granulated.  Dimensions are smaller. There is slight hyper granulation at the superior aspect whereas the inferior part has more depth. There is no evidence of surrounding infection and there is some advancing epithelialization Electronic Signature(s) Signed: 12/07/2017 4:50:03 PM By: Baltazar Najjar MD Entered By: Baltazar Najjar on 12/07/2017 13:40:09 Zoe Robinson ([id number]) -------------------------------------------------------------------------------- Physician Orders Details Patient Name: Zoe Robinson Date of Service: 12/07/2017 1:15 PM Medical Record Number: [id number] Patient Account Number: [id number] Date of Birth/Sex: 11-Apr-1932 (84 y.o. F) Treating RN: Huel Coventry Primary Care Provider: Gabriel Cirri Other Clinician: Referring Provider: Gabriel Cirri Treating Provider/Extender: Altamese [place] in Treatment: 48 Verbal / Phone Orders: No Diagnosis Coding Wound Cleansing Wound #1 Left,Medial Lower Leg o Cleanse wound with mild soap and water Anesthetic (add to Medication List) Wound #1 Left,Medial Lower Leg o Topical Lidocaine 4% cream applied to wound bed prior to debridement (In Clinic Only). Primary Wound Dressing Wound #1 Left,Medial Lower Leg o Hydrafera Blue Ready Transfer - cut to size of wound Secondary Dressing Wound #1 Left,Medial Lower Leg o Boardered Foam Dressing Dressing Change Frequency Wound #1 Left,Medial Lower Leg o Change dressing every other day. Follow-up Appointments Wound #1 Left,Medial Lower Leg o Return Appointment in 1 week. Edema Control Wound #1 Left,Medial Lower Leg o Patient to wear own compression stockings o Elevate legs to the level of the heart and pump ankles as often as possible Home Health Wound #1 Left,Medial Lower Leg o Continue Home Health Visits o Home Health Nurse may visit PRN to address patientos wound care needs. o FACE TO FACE ENCOUNTER: MEDICARE  and MEDICAID PATIENTS: I certify that this patient is under my care and that I had a face-to-face encounter that meets the physician face-to-face encounter requirements with this patient on this date. The encounter with the patient was in whole or in part for the following MEDICAL CONDITION: (primary reason for Home Healthcare) MEDICAL NECESSITY: I certify, that based on my findings, NURSING services are a medically necessary home health service. HOME BOUND STATUS: I certify that my clinical findings support that this patient is homebound (i.e., Due to illness or injury, pt requires aid of supportive devices such as crutches, cane, wheelchairs, walkers, the use of special transportation or the assistance of another person to leave their place of residence. There is a normal inability to leave the home JATZIRY, WECHTER ([id number]) and doing so requires considerable and taxing effort. Other absences are for medical reasons / religious services and are infrequent or of short duration when for other reasons). o If current dressing causes regression in wound condition, may D/C ordered dressing product/s and apply Normal Saline Moist Dressing daily until next Wound Healing Center / Other MD appointment. Notify Wound Healing Center of regression in wound condition at [phone]. o Please direct any NON-WOUND related issues/requests for orders to patient's Primary Care Physician Electronic Signature(s) Signed: 12/07/2017 4:50:03 PM By: Baltazar Najjar MD Signed: 12/07/2017 4:52:44 PM By: Elliot Gurney, BSN, RN, CWS, Kim RN, BSN Entered By: Elliot Gurney, BSN, RN, CWS, Kim on 12/07/2017 13:32:03 Zoe Robinson ([id number]) -------------------------------------------------------------------------------- Problem List Details Patient Name: Zoe Robinson Date of Service: 12/07/2017 1:15 PM Medical Record Number: [id number] Patient Account Number: [id number] Date of Birth/Sex: 1931/10/29 (84 y.o. 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 09/07/2017  Yes I87.312 Chronic venous hypertension (idiopathic) with ulcer of left 09/07/2017 Yes lower extremity M05.80 Other rheumatoid arthritis with rheumatoid factor of 09/07/2017 Yes unspecified site Inactive Problems Resolved Problems Electronic Signature(s) Signed: 12/07/2017 4:50:03 PM By: Baltazar Najjar MD Entered By: Baltazar Najjar on 12/07/2017 13:33:28 Zoe Robinson ([id number]) -------------------------------------------------------------------------------- Progress Note Details Patient Name: Zoe Robinson Date of Service: 12/07/2017 1:15 PM Medical Record Number: [id number] Patient Account Number: [id number] Date of Birth/Sex: 1932/02/26 (84 y.o. F) Treating RN: Huel Coventry Primary Care Provider: Gabriel Cirri Other Clinician: Referring Provider: Gabriel Cirri Treating Provider/Extender: Altamese [place] in Treatment: 13 Subjective History of Present Illness (HPI) 09/07/17; this is an 84 year old woman who arrives accompanied by her daughter. She has a history of severe rheumatoid arthritis followed by Dr. Corliss Skains in [place]. By review of care everywhere it appears that she had cellulitis of her left lower leg in September 2018. This may have been caused by a dog scratch. She was followed and her primary care office at [facility] affiliated group. She received antibiotics, Silvadene cream as well as Una boots. This was followed through late October and the wound was healed out on 10/31. The patient and her daughters state that the current wound started with a scissor injury to the medial left calf before the wound  was actually healed out in other words this is a different wound area. The original wound was more medial on the left anterior tibial area. Since then they've been using Silvadene cream and apparently she changed doctors who recommended Polysporin and they've been referred here for the injury on her left medial calf. She does not have a history of rheumatoid-related skin issues/vasculitis The patient is a type II diabetic with most recent hemoglobin A1c of 6. She has advanced seropositive rheumatoid arthritis on prednisone 10 mg alternating with 5 and methotrexate. I see she is also on xarelto while Im not really sure why. She has significant osteoporosis, history of hypercalcemia. ABI in this clinic is 1.08. She was a former smoker 09/14/17; patient's wound slightly smaller today however still covered in a very thick adherent necrotic surface. We have been using Santyl under compression. We finally got home health to start yesterday "advanced" 09/21/17; not too much change in the area here. We have been using Santyl under compression and I'll continue this after debridement today. Consider Iodoflex if we don't get a better looking wound surface 09/22/17-she presents today as an urgent follow-up secondary to uncontrolled bleeding overnight. She has saturated through the 3-layer compression. Upon dressing removal there is significant clot formation, she continues to bleed from the most proximal aspect of the ulcer. 4 silver nitrate sticks were used to try to obtain hemostasis, along with 10 minutes of pressure dressing. The wound continues to bleed although not as briskly; this appears to be more of a venous bleed and arterial. She is hemodynamically stable upon presentation to the clinic SBP 130's, HR 80's with no complaints of dizziness, lightheadedness. We are unable to obtain any hemostatic agents from the emergency department. Since she has bled through the pressure dressing and continues to bleed  despite all efforts available in the clinic she has been sent to the emergency department for evaluation and treatment 09/28/17; the patient has not had an easy time since the last time I saw her. She continues to have nonviable surface over the wound perhaps slightly better using Santyl. She has on xarelto which no doubt contributes to the bleeding after debridement. I'm going to change her  to Iodoflex today. She is having a lot of discomfort with this we gave her tramadol which is reasonable 10/05/17;patient continues to have nonviable surface over a very difficult wound.we also note that her propensity to bleed. We have been using Iodoflex. previously minimal response previously to [employer]. She tells me that her rheumatologist is made adjustments to both her prednisone and methotrexate in attempt to help heal this difficult area. One would wonder if this is all venous insufficiency and whether the rheumatoid arthritis itself could be contributing to an inflammatory ulcer/rheumatoid vasculitis 10/12/17 She is here in follow up evaluation for a left lower extremity ulcer. There is no improvement. We will transition to santyl. 10/19/17; she has transitioned back to santyl and has been changing this every day. There has been some improvement although still requiring mechanical debridement 10/25/17; still using Santyl on the area on the left lower extremity. There is improvement for the first time. Recently no debridement was felt to be necessary.she did not respond well to Iodoflex. Request for fThera skin was denied by her insurance 11/02/17; still using Santyl. She did not respond well to Iodoflex. We are getting a much better surface that this deep wound. Still requiring debridement.there is no advanced treatment option through her primary insurance Zoe Robinson, Zoe Robinson ([id number]) 11/09/17; we are still using Santyl. Better looking surface still requiring mechanical debridement. There is on the  left medial lower extremity.this initially started at the time of cellulitis in the left leg which may have been caused by a dog scratch. Had a lot of trouble getting in adequate surface on this wound. We did use Iodoflex for a period of time. we have not been able to get a proper surface to consider an alternative dressing 11/16/17; we finally have this wound down to a healthy granulated surface. We can graduate from [facility] to silver collagen. I have again reviewed the history of this apparently the patient had a scratch injury in the lateral aspect of her leg. In the course of wrapping this this was apparently a scissor injury at her primary's office. I'm he got this wrong and the original history 11/23/17; switch to Santyl collagen last week. Arrives today with some slough over the surface over this represents an easy debridement. No change in dimensions. This was a scissor injury initially. 11/29/17; using collagen since last week. Wound surface is vibrant today. No change in dimensions however 12/07/17; wound is measuring smaller. Some surface left easily removed with gauze. Some hyper granulation generally the surface looks better.we have been using silver collagen I changed this to Hydrofera Blue Objective Constitutional Sitting or standing Blood Pressure is within target range for patient.. Pulse regular and within target range for patient.Marland Kitchen Respirations regular, non-labored and within target range.. Temperature is normal and within the target range for the patient.Marland Kitchen appears in no distress. Vitals Time Taken: 1:15 PM, Height: 59 in, Weight: 117 lbs, BMI: 23.6, Temperature: 98.8 F, Pulse: 81 bpm, Respiratory Rate: 16 breaths/min, Blood Pressure: 139/78 mmHg. Eyes Conjunctivae clear. No discharge. Respiratory Respiratory effort is easy and symmetric bilaterally. Rate is normal at rest and on room air.. Cardiovascular Pedal pulses palpable and strong bilaterally.. Lymphatic none palpable  in the popliteal or inguinal area. Psychiatric No evidence of depression, anxiety, or agitation. Calm, cooperative, and communicative. Appropriate interactions and affect.. General Notes: wound exam; the patient's wound did not require debridement. Surface of the wound is well granulated. Dimensions are smaller. There is slight hyper granulation at the superior aspect whereas  the inferior part has more depth. There is no evidence of surrounding infection and there is some advancing epithelialization Integumentary (Hair, Skin) some chronic venous changes but no other major skin issues seen. Wound #1 status is Open. Original cause of wound was Trauma. The wound is located on the Left,Medial Lower Leg. The wound measures 1.8cm length x 1cm width x 0.2cm depth; 1.414cm^2 area and 0.283cm^3 volume. There is Fat Layer Zoe Robinson, Zoe Robinson ([id number]) (Subcutaneous Tissue) Exposed exposed. There is no tunneling or undermining noted. There is a medium amount of serous drainage noted. The wound margin is flat and intact. There is medium (34-66%) red, pink, hyper - granulation within the wound bed. There is a medium (34-66%) amount of necrotic tissue within the wound bed including Adherent Slough. The periwound skin appearance did not exhibit: Callus, Crepitus, Excoriation, Induration, Rash, Scarring, Dry/Scaly, Maceration, Atrophie Blanche, Cyanosis, Ecchymosis, Hemosiderin Staining, Mottled, Pallor, Rubor, Erythema. Periwound temperature was noted as No Abnormality. Assessment Active Problems ICD-10 L97.223 - 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 muscle I87.312 - Chronic venous hypertension (idiopathic) with ulcer of left lower extremity M05.80 - Other rheumatoid arthritis with rheumatoid factor of unspecified site Plan Wound Cleansing: Wound #1 Left,Medial Lower Leg: Cleanse wound with mild soap and water Anesthetic (add to Medication List): Wound #1 Left,Medial Lower  Leg: Topical Lidocaine 4% cream applied to wound bed prior to debridement (In Clinic Only). Primary Wound Dressing: Wound #1 Left,Medial Lower Leg: Hydrafera Blue Ready Transfer - cut to size of wound Secondary Dressing: Wound #1 Left,Medial Lower Leg: Boardered Foam Dressing Dressing Change Frequency: Wound #1 Left,Medial Lower Leg: Change dressing every other day. Follow-up Appointments: Wound #1 Left,Medial Lower Leg: Return Appointment in 1 week. Edema Control: Wound #1 Left,Medial Lower Leg: Patient to wear own compression stockings Elevate legs to the level of the heart and pump ankles as often as possible Home Health: Wound #1 Left,Medial Lower Leg: Continue Home Health Visits Home Health Nurse may visit PRN to address patient s wound care needs. FACE TO FACE ENCOUNTER: MEDICARE and MEDICAID PATIENTS: I certify that this patient is under my care and that I had a face-to-face encounter that meets the physician face-to-face encounter requirements with this patient on this date. The encounter with the patient was in whole or in part for the following MEDICAL CONDITION: (primary reason for Home Healthcare) MEDICAL NECESSITY: I certify, that based on my findings, NURSING services are a medically necessary home health service. HOME BOUND STATUS: I certify that my clinical findings support that this patient is homebound (i.e., Due to illness or injury, pt requires aid of supportive devices such as crutches, cane, wheelchairs, walkers, the use of special Zoe Robinson, Zoe Robinson ([id number]) transportation or the assistance of another person to leave their place of residence. There is a normal inability to leave the home and doing so requires considerable and taxing effort. Other absences are for medical reasons / religious services and are infrequent or of short duration when for other reasons). If current dressing causes regression in wound condition, may D/C ordered dressing product/s and  apply Normal Saline Moist Dressing daily until next Wound Healing Center / Other MD appointment. Notify Wound Healing Center of regression in wound condition at [phone]. Please direct any NON-WOUND related issues/requests for orders to patient's Primary Care Physician #1 very difficult wound on the left medial lower leg. I change the primary dressing the Hydrofera Blue hoping to deal with hyperventilation and also promote some granulation. #2 no evidence of  surrounding infection Electronic Signature(s) Signed: 12/07/2017 4:50:03 PM By: Baltazar Najjar MD Entered By: Baltazar Najjar on 12/07/2017 13:40:58 Zoe Robinson ([id number]) -------------------------------------------------------------------------------- SuperBill Details Patient Name: Zoe Robinson Date of Service: 12/07/2017 Medical Record Number: [id number] Patient Account Number: [id number] Date of Birth/Sex: 1931/07/29 (84 y.o. F) Treating RN: Huel Coventry Primary Care Provider: Gabriel Cirri Other Clinician: Referring Provider: Gabriel Cirri Treating Provider/Extender: Altamese [place] in Treatment: 13 Diagnosis Coding ICD-10 Codes Code Description [id number] 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 muscle I87.312 Chronic venous hypertension (idiopathic) with ulcer of left lower extremity M05.80 Other rheumatoid arthritis with rheumatoid factor of unspecified site Facility Procedures CPT4 Code: 59563875 Description: 99213 - WOUND CARE VISIT-LEV 3 EST PT Modifier: Quantity: 1 Physician Procedures CPT4 Code: 6433295 Description: 99213 - WC PHYS LEVEL 3 - EST PT ICD-10 Diagnosis Description L97.223 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

## 2017-12-09 NOTE — Progress Notes (Signed)
HAFSAH, HENDLER ([id number]) Visit Report for 12/07/2017 Arrival Information Details Patient Name: Zoe Robinson, Zoe Robinson Date of Service: 12/07/2017 1:15 PM Medical Record Number: [id number] Patient Account Number: [id number] Date of Birth/Sex: [birth year] (84 y.o. F) Treating RN: Renne Crigler Primary Care Miguelina Fore: Gabriel Cirri Other Clinician: Referring Kayshawn Ozburn: Gabriel Cirri Treating Roxene Alviar/Extender: Altamese [place] in Treatment: 13 Visit Information History Since Last Visit All ordered tests and consults were completed: No Patient Arrived: Ambulatory Added or deleted any medications: No Arrival Time: 13:14 Any new allergies or adverse reactions: No Accompanied By: daughter Had a fall or experienced change in No Transfer Assistance: None activities of daily living that may affect Patient Identification Verified: Yes risk of falls: Secondary Verification Process Yes Signs or symptoms of abuse/neglect since last visito No Completed: Hospitalized since last visit: No Patient Requires Transmission-Based No Implantable device outside of the clinic excluding No Precautions: cellular tissue based products placed in the center Patient Has Alerts: Yes since last visit: Patient Alerts: Patient on Blood Pain Present Now: No Thinner xarelto Electronic Signature(s) Signed: 12/07/2017 3:23:03 PM By: Renne Crigler Entered By: Renne Crigler on 12/07/2017 13:14:42 Zoe Robinson ([id number]) -------------------------------------------------------------------------------- Clinic Level of Care Assessment Details Patient Name: Zoe Robinson Date of Service: 12/07/2017 1:15 PM Medical Record Number: [id number] Patient Account Number: [id number] Date of Birth/Sex: May 12, 1932 (84 y.o. F) Treating RN: Huel Coventry Primary Care Braydyn Schultes: Gabriel Cirri Other Clinician: Referring Alexia Dinger: Gabriel Cirri Treating Vaunda Gutterman/Extender: Altamese [place] in  Treatment: 13 Clinic Level of Care Assessment Items TOOL 4 Quantity Score []  - Use when only an EandM is performed on FOLLOW-UP visit 0 ASSESSMENTS - Nursing Assessment / Reassessment X - Reassessment of Co-morbidities (includes updates in patient status) 1 10 X- 1 5 Reassessment of Adherence to Treatment Plan ASSESSMENTS - Wound and Skin Assessment / Reassessment X - Simple Wound Assessment / Reassessment - one wound 1 5 []  - 0 Complex Wound Assessment / Reassessment - multiple wounds []  - 0 Dermatologic / Skin Assessment (not related to wound area) ASSESSMENTS - Focused Assessment []  - Circumferential Edema Measurements - multi extremities 0 []  - 0 Nutritional Assessment / Counseling / Intervention []  - 0 Lower Extremity Assessment (monofilament, tuning fork, pulses) []  - 0 Peripheral Arterial Disease Assessment (using hand held doppler) ASSESSMENTS - Ostomy and/or Continence Assessment and Care []  - Incontinence Assessment and Management 0 []  - 0 Ostomy Care Assessment and Management (repouching, etc.) PROCESS - Coordination of Care X - Simple Patient / Family Education for ongoing care 1 15 []  - 0 Complex (extensive) Patient / Family Education for ongoing care X- 1 10 Staff obtains , Records, Test Results / Process Orders []  - 0 Staff telephones HHA, Nursing Homes / Clarify orders / etc []  - 0 Routine Transfer to another Facility (non-emergent condition) []  - 0 Routine Hospital Admission (non-emergent condition) []  - 0 New Admissions / / Ordering NPWT, Apligraf, etc. []  - 0 Emergency Hospital Admission (emergent condition) X- 1 10 Simple Discharge Coordination CABRINI, RUGGIERI ( ) []  - 0 Complex (extensive) Discharge Coordination PROCESS - Special Needs []  - Pediatric / Minor Patient Management 0 []  - 0 Isolation Patient Management []  - 0 Hearing / Language / Visual special needs []  - 0 Assessment of Community  assistance (transportation, D/C planning, etc.) []  - 0 Additional assistance / Altered mentation []  - 0 Support Surface(s) Assessment (bed, cushion, seat, etc.) INTERVENTIONS - Wound Cleansing / Measurement X - Simple Wound Cleansing - one wound 1 5 []  -  0 Complex Wound Cleansing - multiple wounds X- 1 5 Wound Imaging (photographs - any number of wounds) []  - 0 Wound Tracing (instead of photographs) X- 1 5 Simple Wound Measurement - one wound []  - 0 Complex Wound Measurement - multiple wounds INTERVENTIONS - Wound Dressings []  - Small Wound Dressing one or multiple wounds 0 X- 1 15 Medium Wound Dressing one or multiple wounds []  - 0 Large Wound Dressing one or multiple wounds []  - 0 Application of Medications - topical []  - 0 Application of Medications - injection INTERVENTIONS - Miscellaneous []  - External ear exam 0 []  - 0 Specimen Collection (cultures, biopsies, blood, body fluids, etc.) []  - 0 Specimen(s) / Culture(s) sent or taken to Lab for analysis []  - 0 Patient Transfer (multiple staff / Nurse, adult / Similar devices) []  - 0 Simple Staple / Suture removal (25 or less) []  - 0 Complex Staple / Suture removal (26 or more) []  - 0 Hypo / Hyperglycemic Management (close monitor of Blood Glucose) []  - 0 Ankle / Brachial Index (ABI) - do not check if billed separately X- 1 5 Vital Signs Helsley, Deadra ([id number]) Has the patient been seen at the hospital within the last three years: Yes Total Score: 90 Level Of Care: New/Established - Level 3 Electronic Signature(s) Signed: 12/07/2017 4:52:44 PM By: Elliot Gurney, BSN, RN, CWS, Kim RN, BSN Entered By: Elliot Gurney, BSN, RN, CWS, Kim on 12/07/2017 13:32:39 Zoe Robinson ([id number]) -------------------------------------------------------------------------------- Encounter Discharge Information Details Patient Name: Zoe Robinson Date of Service: 12/07/2017 1:15 PM Medical Record Number: [id number] Patient Account  Number: [id number] Date of Birth/Sex: [birth year] (84 y.o. F) Treating RN: Curtis Sites Primary Care Tylisha Danis: Gabriel Cirri Other Clinician: Referring Lafe Clerk: Gabriel Cirri Treating Bryton Waight/Extender: Altamese [place] in Treatment: 13 Encounter Discharge Information Items Discharge Condition: Stable Ambulatory Status: Ambulatory Discharge Destination: Home Transportation: Private Auto Accompanied By: dtr Schedule Follow-up Appointment: Yes Clinical Summary of Care: Electronic Signature(s) Signed: 12/07/2017 4:19:43 PM By: Curtis Sites Entered By: Curtis Sites on 12/07/2017 16:19:43 Zoe Robinson ([id number]) -------------------------------------------------------------------------------- Lower Extremity Assessment Details Patient Name: Zoe Robinson Date of Service: 12/07/2017 1:15 PM Medical Record Number: [id number] Patient Account Number: [id number] Date of Birth/Sex: Sep 11, 1931 (85 y.o. F) Treating RN: Renne Crigler Primary Care Roberto Hlavaty: Gabriel Cirri Other Clinician: Referring Qadir Folks: Gabriel Cirri Treating Martino Tompson/Extender: Maxwell Caul [place] in Treatment: 13 Edema Assessment Assessed: [Left: No] [Right: No] Edema: [Left: N] [Right: o] Vascular Assessment Claudication: Claudication Assessment [Right:None] Pulses: Dorsalis Pedis Palpable: [Right:Yes] Posterior Tibial Extremity colors, hair growth, and conditions: Extremity Color: [Right:Normal] Hair Growth on Extremity: [Right:No] Temperature of Extremity: [Right:Warm] Capillary Refill: [Right:< 3 seconds] Toe Nail Assessment Left: Right: Thick: Yes Discolored: Yes Deformed: Yes Improper Length and Hygiene: Yes Electronic Signature(s) Signed: 12/07/2017 3:23:03 PM By: Renne Crigler Entered By: Renne Crigler on 12/07/2017 13:19:45 Zoe Robinson ([id number]) -------------------------------------------------------------------------------- Multi Wound Chart  Details Patient Name: Zoe Robinson Date of Service: 12/07/2017 1:15 PM Medical Record Number: [id number] Patient Account Number: [id number] Date of Birth/Sex: [birth year] (85 y.o. F) Treating RN: Huel Coventry Primary Care Tawfiq Favila: Gabriel Cirri Other Clinician: Referring Claudine Stallings: Gabriel Cirri Treating Kyarah Enamorado/Extender: Altamese [place] in Treatment: 13 Vital Signs Height(in): 59 Pulse(bpm): 81 Weight(lbs): 117 Blood Pressure(mmHg): 139/78 Body Mass Index(BMI): 24 Temperature(F): 98.8 Respiratory Rate 16 (breaths/min): Photos: [1:No Photos] [N/A:N/A] Wound Location: [1:Left Lower Leg - Medial] [N/A:N/A] Wounding Event: [1:Trauma] [N/A:N/A] Primary Etiology: [1:Venous Leg Ulcer] [N/A:N/A] Comorbid History: [1:Cataracts, Anemia, Arrhythmia, Hypertension, Rheumatoid Arthritis, Osteoarthritis] [N/A:N/A] Date Acquired: [1:05/02/2017] [N/A:N/A] Weeks of  Treatment: [1:13] [N/A:N/A] Wound Status: [1:Open] [N/A:N/A] Measurements L x W x D [1:1.8x1x0.2] [N/A:N/A] (cm) Area (cm) : [1:1.414] [N/A:N/A] Volume (cm) : [1:0.283] [N/A:N/A] % Reduction in Area: [1:81.30%] [N/A:N/A] % Reduction in Volume: [1:62.60%] [N/A:N/A] Classification: [1:Full Thickness Without Exposed Support Structures] [N/A:N/A] Exudate Amount: [1:Medium] [N/A:N/A] Exudate Type: [1:Serous] [N/A:N/A] Exudate Color: [1:amber] [N/A:N/A] Wound Margin: [1:Flat and Intact] [N/A:N/A] Granulation Amount: [1:Medium (34-66%)] [N/A:N/A] Granulation Quality: [1:Red, Pink, Hyper-granulation] [N/A:N/A] Necrotic Amount: [1:Medium (34-66%)] [N/A:N/A] Exposed Structures: [1:Fat Layer (Subcutaneous Tissue) Exposed: Yes Fascia: No Tendon: No Muscle: No Joint: No Bone: No] [N/A:N/A] Epithelialization: [1:Small (1-33%)] [N/A:N/A] Periwound Skin Texture: [1:Excoriation: No Induration: No Callus: No] [N/A:N/A] Crepitus: No Rash: No Scarring: No Periwound Skin Moisture: Maceration: No N/A N/A Dry/Scaly:  No Periwound Skin Color: Atrophie Blanche: No N/A N/A Cyanosis: No Ecchymosis: No Erythema: No Hemosiderin Staining: No Mottled: No Pallor: No Rubor: No Temperature: No Abnormality N/A N/A Tenderness on Palpation: No N/A N/A Wound Preparation: Ulcer Cleansing: N/A N/A Rinsed/Irrigated with Saline Topical Anesthetic Applied: Other: lidocaine 4% Treatment Notes Electronic Signature(s) Signed: 12/07/2017 4:50:03 PM By: Baltazar Najjar MD Entered By: Baltazar Najjar on 12/07/2017 13:33:37 Zoe Robinson ([id number]) -------------------------------------------------------------------------------- Multi-Disciplinary Care Plan Details Patient Name: Zoe Robinson Date of Service: 12/07/2017 1:15 PM Medical Record Number: [id number] Patient Account Number: [id number] Date of Birth/Sex: Apr 07, 1932 (85 y.o. F) Treating RN: Huel Coventry Primary Care Camyla Camposano: Gabriel Cirri Other Clinician: Referring Eleora Sutherland: Gabriel Cirri Treating Agusta Hackenberg/Extender: Altamese [place] in Treatment: 13 Active Inactive ` Abuse / Safety / Falls / Self Care Management Nursing Diagnoses: Potential for falls Goals: Patient will not experience any injury related to falls Date Initiated: 09/07/2017 Target Resolution Date: 12/03/2017 Goal Status: Active Interventions: Assess fall risk on admission and as needed Notes: ` Orientation to the Wound Care Program Nursing Diagnoses: Knowledge deficit related to the wound healing center program Goals: Patient/caregiver will verbalize understanding of the Wound Healing Center Program Date Initiated: 09/07/2017 Target Resolution Date: 12/03/2017 Goal Status: Active Interventions: Provide education on orientation to the wound center Notes: ` Wound/Skin Impairment Nursing Diagnoses: Impaired tissue integrity Goals: Ulcer/skin breakdown will heal within 14 weeks Date Initiated: 09/07/2017 Target Resolution Date: 12/03/2017 Goal Status:  Active Interventions: KYMBERLY, BLOMBERG ([id number]) Assess patient/caregiver ability to obtain necessary supplies Assess patient/caregiver ability to perform ulcer/skin care regimen upon admission and as needed Assess ulceration(s) every visit Notes: Electronic Signature(s) Signed: 12/07/2017 4:52:44 PM By: Elliot Gurney, BSN, RN, CWS, Kim RN, BSN Entered By: Elliot Gurney, BSN, RN, CWS, Kim on 12/07/2017 13:30:06 Zoe Robinson ([id number]) -------------------------------------------------------------------------------- Pain Assessment Details Patient Name: Zoe Robinson Date of Service: 12/07/2017 1:15 PM Medical Record Number: [id number] Patient Account Number: [id number] Date of Birth/Sex: 1932/07/04 (85 y.o. F) Treating RN: Renne Crigler Primary Care Heela Heishman: Gabriel Cirri Other Clinician: Referring Corah Willeford: Gabriel Cirri Treating Taniyah Ballow/Extender: Altamese [place] in Treatment: 13 Active Problems Location of Pain Severity and Description of Pain Patient Has Paino No Site Locations Pain Management and Medication Current Pain Management: Electronic Signature(s) Signed: 12/07/2017 3:23:03 PM By: Renne Crigler Entered By: Renne Crigler on 12/07/2017 13:14:49 Zoe Robinson ([id number]) -------------------------------------------------------------------------------- Patient/Caregiver Education Details Patient Name: Zoe Robinson Date of Service: 12/07/2017 1:15 PM Medical Record Number: [id number] Patient Account Number: [id number] Date of Birth/Gender: [birth year] (85 y.o. F) Treating RN: Curtis Sites Primary Care Physician: Gabriel Cirri Other Clinician: Referring Physician: Gabriel Cirri Treating Physician/Extender: Altamese [place] in Treatment: 13 Education Assessment Education Provided To: Patient and Caregiver Education Topics Provided Wound/Skin Impairment: Handouts: Other: wound care as ordered  Methods: Demonstration,  Explain/Verbal Responses: State content correctly Electronic Signature(s) Signed: 12/07/2017 4:31:04 PM By: Curtis Sites Entered By: Curtis Sites on 12/07/2017 16:20:12 Zoe Robinson ([id number]) -------------------------------------------------------------------------------- Wound Assessment Details Patient Name: Zoe Robinson Date of Service: 12/07/2017 1:15 PM Medical Record Number: [id number] Patient Account Number: [id number] Date of Birth/Sex: 02-Jan-1932 (85 y.o. F) Treating RN: Renne Crigler Primary Care Yarithza Mink: Gabriel Cirri Other Clinician: Referring Meryem Haertel: Gabriel Cirri Treating Luba Matzen/Extender: Altamese [place] in Treatment: 13 Wound Status Wound Number: 1 Primary Venous Leg Ulcer Etiology: Wound Location: Left Lower Leg - Medial Wound Open Wounding Event: Trauma Status: Date Acquired: 05/02/2017 Comorbid Cataracts, Anemia, Arrhythmia, Hypertension, Weeks Of Treatment: 13 History: Rheumatoid Arthritis, Osteoarthritis Clustered Wound: No Wound Measurements Length: (cm) 1.8 Width: (cm) 1 Depth: (cm) 0.2 Area: (cm) 1.414 Volume: (cm) 0.283 % Reduction in Area: 81.3% % Reduction in Volume: 62.6% Epithelialization: Small (1-33%) Tunneling: No Undermining: No Wound Description Full Thickness Without Exposed Support Classification: Structures Wound Margin: Flat and Intact Exudate Medium Amount: Exudate Type: Serous Exudate Color: amber Foul Odor After Cleansing: No Slough/Fibrino Yes Wound Bed Granulation Amount: Medium (34-66%) Exposed Structure Granulation Quality: Red, Pink, Hyper-granulation Fascia Exposed: No Necrotic Amount: Medium (34-66%) Fat Layer (Subcutaneous Tissue) Exposed: Yes Necrotic Quality: Adherent Slough Tendon Exposed: No Muscle Exposed: No Joint Exposed: No Bone Exposed: No Periwound Skin Texture Texture Color No Abnormalities Noted: No No Abnormalities Noted: No Callus: No Atrophie Blanche:  No Crepitus: No Cyanosis: No Excoriation: No Ecchymosis: No Induration: No Erythema: No Rash: No Hemosiderin Staining: No Scarring: No Mottled: No Pallor: No Moisture Rubor: No No Abnormalities Noted: No Dry / Scaly: No Temperature / Pain Venard, Callan ([id number]) Maceration: No Temperature: No Abnormality Wound Preparation Ulcer Cleansing: Rinsed/Irrigated with Saline Topical Anesthetic Applied: Other: lidocaine 4%, Treatment Notes Wound #1 (Left, Medial Lower Leg) 1. Cleansed with: Clean wound with Normal Saline 2. Anesthetic Topical Lidocaine 4% cream to wound bed prior to debridement 4. Dressing Applied: Hydrafera Blue 5. Secondary Dressing Applied Bordered Foam Dressing Electronic Signature(s) Signed: 12/07/2017 3:23:03 PM By: Renne Crigler Entered By: Renne Crigler on 12/07/2017 13:19:01 Zoe Robinson ([id number]) -------------------------------------------------------------------------------- Vitals Details Patient Name: Zoe Robinson Date of Service: 12/07/2017 1:15 PM Medical Record Number: [id number] Patient Account Number: [id number] Date of Birth/Sex: 07-22-1932 (85 y.o. F) Treating RN: Renne Crigler Primary Care Shevawn Langenberg: Gabriel Cirri Other Clinician: Referring Everlene Cunning: Gabriel Cirri Treating Catheryn Slifer/Extender: Altamese [place] in Treatment: 13 Vital Signs Time Taken: 13:15 Temperature (F): 98.8 Height (in): 59 Pulse (bpm): 81 Weight (lbs): 117 Respiratory Rate (breaths/min): 16 Body Mass Index (BMI): 23.6 Blood Pressure (mmHg): 139/78 Reference Range: 80 - 120 mg / dl Electronic Signature(s) Signed: 12/07/2017 3:23:03 PM By: Renne Crigler Entered By: Renne Crigler on 12/07/2017 13:18:19

## 2017-12-14 ENCOUNTER — Encounter: Payer: Medicare Other | Admitting: Internal Medicine

## 2017-12-14 DIAGNOSIS — E11622 Type 2 diabetes mellitus with other skin ulcer: Secondary | ICD-10-CM | POA: Diagnosis not present

## 2017-12-16 NOTE — Progress Notes (Signed)
Zoe Robinson, Zoe Robinson ([id number]) Visit Report for 12/14/2017 HPI Details Patient Name: Zoe, Robinson Date of Service: 12/14/2017 11:15 AM Medical Record Number: [id number] Patient Account Number: [id number] Date of Birth/Sex: [birth year] (85 y.o. F) Treating RN: Huel Coventry Primary Care Provider: Gabriel Cirri Other Clinician: Referring Provider: Gabriel Cirri Treating Provider/Extender: Altamese [place] in Treatment: 14 History of Present Illness HPI Description: 09/07/17; this is an 84 year old woman who arrives accompanied by her daughter. She has a history of severe rheumatoid arthritis followed by Dr. Corliss Skains in [place]. By review of care everywhere it appears that she had cellulitis of her left lower leg in September 2018. This may have been caused by a dog scratch. She was followed and her primary care office at [facility] affiliated group. She received antibiotics, Silvadene cream as well as Una boots. This was followed through late October and the wound was healed out on 10/31. The patient and her daughters state that the current wound started with a scissor injury to the medial left calf before the wound was actually healed out in other words this is a different wound area. The original wound was more medial on the left anterior tibial area. Since then they've been using Silvadene cream and apparently she changed doctors who recommended Polysporin and they've been referred here for the injury on her left medial calf. She does not have a history of rheumatoid-related skin issues/vasculitis The patient is a type II diabetic with most recent hemoglobin A1c of 6. She has advanced seropositive rheumatoid arthritis on prednisone 10 mg alternating with 5 and methotrexate. I see she is also on xarelto while Im not really sure why. She has significant osteoporosis, history of hypercalcemia. ABI in this clinic is 1.08. She was a former smoker 09/14/17; patient's wound slightly smaller  today however still covered in a very thick adherent necrotic surface. We have been using Santyl under compression. We finally got home health to start yesterday "advanced" 09/21/17; not too much change in the area here. We have been using Santyl under compression and I'll continue this after debridement today. Consider Iodoflex if we don't get a better looking wound surface 09/22/17-she presents today as an urgent follow-up secondary to uncontrolled bleeding overnight. She has saturated through the 3-layer compression. Upon dressing removal there is significant clot formation, she continues to bleed from the most proximal aspect of the ulcer. 4 silver nitrate sticks were used to try to obtain hemostasis, along with 10 minutes of pressure dressing. The wound continues to bleed although not as briskly; this appears to be more of a venous bleed and arterial. She is hemodynamically stable upon presentation to the clinic SBP 130's, HR 80's with no complaints of dizziness, lightheadedness. We are unable to obtain any hemostatic agents from the emergency department. Since she has bled through the pressure dressing and continues to bleed despite all efforts available in the clinic she has been sent to the emergency department for evaluation and treatment 09/28/17; the patient has not had an easy time since the last time I saw her. She continues to have nonviable surface over the wound perhaps slightly better using Santyl. She has on xarelto which no doubt contributes to the bleeding after debridement. I'm going to change her to Iodoflex today. She is having a lot of discomfort with this we gave her tramadol which is reasonable 10/05/17;patient continues to have nonviable surface over a very difficult wound.we also note that her propensity to bleed. We have been using Iodoflex. previously minimal response previously  to [facility]. She tells me that her rheumatologist is made adjustments to both her prednisone and  methotrexate in attempt to help heal this difficult area. One would wonder if this is all venous insufficiency and whether the rheumatoid arthritis itself could be contributing to an inflammatory ulcer/rheumatoid vasculitis 10/12/17 She is here in follow up evaluation for a left lower extremity ulcer. There is no improvement. We will transition to santyl. 10/19/17; she has transitioned back to santyl and has been changing this every day. There has been some improvement although still requiring mechanical debridement 10/25/17; still using Santyl on the area on the left lower extremity. There is improvement for the first time. Recently no debridement was felt to be necessary.she did not respond well to Iodoflex. Request for fThera skin was denied by her insurance Zoe Robinson, Zoe Robinson ([id number]) 11/02/17; still using Santyl. She did not respond well to Iodoflex. We are getting a much better surface that this deep wound. Still requiring debridement.there is no advanced treatment option through her primary insurance [[employer]] 11/09/17; we are still using Santyl. Better looking surface still requiring mechanical debridement. There is on the left medial lower extremity.this initially started at the time of cellulitis in the left leg which may have been caused by a dog scratch. Had a lot of trouble getting in adequate surface on this wound. We did use Iodoflex for a period of time. we have not been able to get a proper surface to consider an alternative dressing 11/16/17; we finally have this wound down to a healthy granulated surface. We can graduate from [facility] to silver collagen. I have again reviewed the history of this apparently the patient had a scratch injury in the lateral aspect of her leg. In the course of wrapping this this was apparently a scissor injury at her primary's office. I'm he got this wrong and the original history 11/23/17; switch to Santyl collagen last week. Arrives today with some slough  over the surface over this represents an easy debridement. No change in dimensions. This was a scissor injury initially. 11/29/17; using collagen since last week. Wound surface is vibrant today. No change in dimensions however 12/07/17; wound is measuring smaller. Some surface left easily removed with gauze. Some hyper granulation generally the surface looks better.we have been using silver collagen I changed this to [facility] 12/14/17; wound is measuring smaller. Surface looks healthier. We've been using Hydrofera Blue under compression. Electronic Signature(s) Signed: 12/14/2017 5:10:30 PM By: Baltazar Najjar MD Entered By: Baltazar Najjar on 12/14/2017 12:25:27 Zoe Robinson ([id number]) -------------------------------------------------------------------------------- Physical Exam Details Patient Name: Zoe Robinson Date of Service: 12/14/2017 11:15 AM Medical Record Number: [id number] Patient Account Number: [id number] Date of Birth/Sex: 08/08/1931 (85 y.o. F) Treating RN: Huel Coventry Primary Care Provider: Gabriel Cirri Other Clinician: Referring Provider: Gabriel Cirri Treating Provider/Extender: Altamese [place] in Treatment: 14 Constitutional Sitting or standing Blood Pressure is within target range for patient.. Pulse regular and within target range for patient.Marland Kitchen Respirations regular, non-labored and within target range.. Temperature is normal and within the target range for the patient.Marland Kitchen appears in no distress. Notes exam; the patient's wound did not require debridement. Dimensions of this are smaller. No hyper-granulation is seen. She has had some epithelialization of this area. She may require some debridement of the deeper area inferiorly and some silver nitrate to the superior part [hyper-granulated] however for now I didn't think any of this is necessary. We'll simply see how this looks next time Electronic Signature(s) Signed: 12/14/2017 5:10:30 PM By:  Baltazar Najjar MD Entered By: Baltazar Najjar on 12/14/2017 12:31:38 Zoe Robinson ([id number]) -------------------------------------------------------------------------------- Physician Orders Details Patient Name: Zoe Robinson Date of Service: 12/14/2017 11:15 AM Medical Record Number: [id number] Patient Account Number: [id number] Date of Birth/Sex: 11-27-1931 (85 y.o. F) Treating RN: Huel Coventry Primary Care Provider: Gabriel Cirri Other Clinician: Referring Provider: Gabriel Cirri Treating Provider/Extender: Altamese [place] in Treatment: 14 Verbal / Phone Orders: No Diagnosis Coding Wound Cleansing Wound #1 Left,Medial Lower Leg o Cleanse wound with mild soap and water Anesthetic (add to Medication List) Wound #1 Left,Medial Lower Leg o Topical Lidocaine 4% cream applied to wound bed prior to debridement (In Clinic Only). Primary Wound Dressing o Hydrafera Blue Ready Transfer Wound #1 Left,Medial Lower Leg o Mepitel One Contact layer - under hydrafera blue Secondary Dressing Wound #1 Left,Medial Lower Leg o Boardered Foam Dressing Dressing Change Frequency Wound #1 Left,Medial Lower Leg o Change dressing every other day. Follow-up Appointments Wound #1 Left,Medial Lower Leg o Return Appointment in 1 week. Edema Control Wound #1 Left,Medial Lower Leg o Patient to wear own compression stockings o Elevate legs to the level of the heart and pump ankles as often as possible Home Health Wound #1 Left,Medial Lower Leg o Continue Home Health Visits o Home Health Nurse may visit PRN to address patientos wound care needs. o FACE TO FACE ENCOUNTER: MEDICARE and MEDICAID PATIENTS: I certify that this patient is under my care and that I had a face-to-face encounter that meets the physician face-to-face encounter requirements with this patient on this date. The encounter with the patient was in whole or in part for the following  MEDICAL CONDITION: (primary reason for Home Healthcare) MEDICAL NECESSITY: I certify, that based on my findings, NURSING services are a medically necessary home health service. HOME BOUND STATUS: I certify that my clinical findings support that this patient is homebound (i.e., Due to illness or injury, pt requires aid of supportive devices such as crutches, cane, wheelchairs, walkers, the use of special transportation or the assistance of another person to leave their place of residence. There is a normal inability to leave the home Zoe Robinson, Zoe Robinson ([id number]) and doing so requires considerable and taxing effort. Other absences are for medical reasons / religious services and are infrequent or of short duration when for other reasons). o If current dressing causes regression in wound condition, may D/C ordered dressing product/s and apply Normal Saline Moist Dressing daily until next Wound Healing Center / Other MD appointment. Notify Wound Healing Center of regression in wound condition at [phone]. o Please direct any NON-WOUND related issues/requests for orders to patient's Primary Care Physician Electronic Signature(s) Signed: 12/14/2017 5:10:30 PM By: Baltazar Najjar MD Signed: 12/14/2017 5:19:09 PM By: Elliot Gurney, BSN, RN, CWS, Kim RN, BSN Entered By: Elliot Gurney, BSN, RN, CWS, Kim on 12/14/2017 11:50:16 Zoe Robinson ([id number]) -------------------------------------------------------------------------------- Problem List Details Patient Name: Zoe Robinson Date of Service: 12/14/2017 11:15 AM Medical Record Number: [id number] Patient Account Number: [id number] Date of Birth/Sex: 07/11/1932 (85 y.o. 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 09/07/2017 Yes I87.312 Chronic venous hypertension (idiopathic) with ulcer of left 09/07/2017 Yes lower extremity M05.80 Other rheumatoid arthritis with rheumatoid factor of 09/07/2017 Yes unspecified site Inactive Problems Resolved Problems Electronic Signature(s) Signed: 12/14/2017 5:10:30 PM  By: Baltazar Najjar MD Entered By: Baltazar Najjar on 12/14/2017 12:24:24 Zoe Robinson ([id number]) -------------------------------------------------------------------------------- Progress Note Details Patient Name: Zoe Robinson Date of Service: 12/14/2017 11:15 AM Medical Record Number: [id number] Patient Account Number: [id number] Date of Birth/Sex: 04-14-1932 (85 y.o. F) Treating RN: Huel Coventry Primary Care Provider: Gabriel Cirri Other Clinician: Referring Provider: Gabriel Cirri Treating Provider/Extender: Altamese [place] in Treatment: 14 Subjective History of Present Illness (HPI) 09/07/17; this is an 84 year old woman who arrives accompanied by her daughter. She has a history of severe rheumatoid arthritis followed by Dr. Corliss Skains in [place]. By review of care everywhere it appears that she had cellulitis of her left lower leg in September 2018. This may have been caused by a dog scratch. She was followed and her primary care office at [facility] affiliated group. She received antibiotics, Silvadene cream as well as Una boots. This was followed through late October and the wound was healed out on 10/31. The patient and her daughters state that the current wound started with a scissor injury to the medial left calf before the wound was actually healed out in other words this is a different wound area. The original wound was more medial on the left anterior tibial area. Since then they've been using Silvadene cream and apparently she changed doctors who recommended Polysporin and they've been  referred here for the injury on her left medial calf. She does not have a history of rheumatoid-related skin issues/vasculitis The patient is a type II diabetic with most recent hemoglobin A1c of 6. She has advanced seropositive rheumatoid arthritis on prednisone 10 mg alternating with 5 and methotrexate. I see she is also on xarelto while Im not really sure why. She has significant osteoporosis, history of hypercalcemia. ABI in this clinic is 1.08. She was a former smoker 09/14/17; patient's wound slightly smaller today however still covered in a very thick adherent necrotic surface. We have been using Santyl under compression. We finally got home health to start yesterday "advanced" 09/21/17; not too much change in the area here. We have been using Santyl under compression and I'll continue this after debridement today. Consider Iodoflex if we don't get a better looking wound surface 09/22/17-she presents today as an urgent follow-up secondary to uncontrolled bleeding overnight. She has saturated through the 3-layer compression. Upon dressing removal there is significant clot formation, she continues to bleed from the most proximal aspect of the ulcer. 4 silver nitrate sticks were used to try to obtain hemostasis, along with 10 minutes of pressure dressing. The wound continues to bleed although not as briskly; this appears to be more of a venous bleed and arterial. She is hemodynamically stable upon presentation to the clinic SBP 130's, HR 80's with no complaints of dizziness, lightheadedness. We are unable to obtain any hemostatic agents from the emergency department. Since she has bled through the pressure dressing and continues to bleed despite all efforts available in the clinic she has been sent to the emergency department for evaluation and treatment 09/28/17; the patient has not had an easy time since the last time I saw her. She continues to have nonviable surface over the wound perhaps  slightly better using Santyl. She has on xarelto which no doubt contributes to the bleeding after debridement. I'm going to change her to Iodoflex today. She is having a lot of discomfort with this we gave her tramadol which is reasonable 10/05/17;patient continues to have nonviable surface over a very difficult wound.we also note that her propensity to  bleed. We have been using Iodoflex. previously minimal response previously to [employer]. She tells me that her rheumatologist is made adjustments to both her prednisone and methotrexate in attempt to help heal this difficult area. One would wonder if this is all venous insufficiency and whether the rheumatoid arthritis itself could be contributing to an inflammatory ulcer/rheumatoid vasculitis 10/12/17 She is here in follow up evaluation for a left lower extremity ulcer. There is no improvement. We will transition to santyl. 10/19/17; she has transitioned back to santyl and has been changing this every day. There has been some improvement although still requiring mechanical debridement 10/25/17; still using Santyl on the area on the left lower extremity. There is improvement for the first time. Recently no debridement was felt to be necessary.she did not respond well to Iodoflex. Request for fThera skin was denied by her insurance 11/02/17; still using Santyl. She did not respond well to Iodoflex. We are getting a much better surface that this deep wound. Still requiring debridement.there is no advanced treatment option through her primary insurance Zoe Robinson, Zoe Robinson ([id number]) 11/09/17; we are still using Santyl. Better looking surface still requiring mechanical debridement. There is on the left medial lower extremity.this initially started at the time of cellulitis in the left leg which may have been caused by a dog scratch. Had a lot of trouble getting in adequate surface on this wound. We did use Iodoflex for a period of time. we have not been  able to get a proper surface to consider an alternative dressing 11/16/17; we finally have this wound down to a healthy granulated surface. We can graduate from [facility] to silver collagen. I have again reviewed the history of this apparently the patient had a scratch injury in the lateral aspect of her leg. In the course of wrapping this this was apparently a scissor injury at her primary's office. I'm he got this wrong and the original history 11/23/17; switch to Santyl collagen last week. Arrives today with some slough over the surface over this represents an easy debridement. No change in dimensions. This was a scissor injury initially. 11/29/17; using collagen since last week. Wound surface is vibrant today. No change in dimensions however 12/07/17; wound is measuring smaller. Some surface left easily removed with gauze. Some hyper granulation generally the surface looks better.we have been using silver collagen I changed this to [facility] 12/14/17; wound is measuring smaller. Surface looks healthier. We've been using Hydrofera Blue under compression. Objective Constitutional Sitting or standing Blood Pressure is within target range for patient.. Pulse regular and within target range for patient.Marland Kitchen Respirations regular, non-labored and within target range.. Temperature is normal and within the target range for the patient.Marland Kitchen appears in no distress. Vitals Time Taken: 11:43 AM, Height: 59 in, Weight: 117 lbs, BMI: 23.6, Temperature: 98.4 F, Pulse: 75 bpm, Respiratory Rate: 16 breaths/min, Blood Pressure: 120/55 mmHg. General Notes: exam; the patient's wound did not require debridement. Dimensions of this are smaller. No hyper-granulation is seen. She has had some epithelialization of this area. She may require some debridement of the deeper area inferiorly and some silver nitrate to the superior part [hyper-granulated] however for now I didn't think any of this is necessary. We'll simply see  how this looks next time Integumentary (Hair, Skin) Wound #1 status is Open. Original cause of wound was Trauma. The wound is located on the Left,Medial Lower Leg. The wound measures 1.5cm length x 0.6cm width x 0.3cm depth; 0.707cm^2 area and 0.212cm^3 volume. There is  Fat Layer (Subcutaneous Tissue) Exposed exposed. There is no tunneling or undermining noted. There is a medium amount of serous drainage noted. The wound margin is flat and intact. There is large (67-100%) red, pink, hyper - granulation within the wound bed. There is a small (1-33%) amount of necrotic tissue within the wound bed including Adherent Slough. The periwound skin appearance did not exhibit: Callus, Crepitus, Excoriation, Induration, Rash, Scarring, Dry/Scaly, Maceration, Atrophie Blanche, Cyanosis, Ecchymosis, Hemosiderin Staining, Mottled, Pallor, Rubor, Erythema. Periwound temperature was noted as No Abnormality. Assessment Active Problems Zoe Robinson, Zoe Robinson ([id number]) ICD-10 [phone] - 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 muscle I87.312 - Chronic venous hypertension (idiopathic) with ulcer of left lower extremity M05.80 - Other rheumatoid arthritis with rheumatoid factor of unspecified site Plan Wound Cleansing: Wound #1 Left,Medial Lower Leg: Cleanse wound with mild soap and water Anesthetic (add to Medication List): Wound #1 Left,Medial Lower Leg: Topical Lidocaine 4% cream applied to wound bed prior to debridement (In Clinic Only). Primary Wound Dressing: Hydrafera Blue Ready Transfer Wound #1 Left,Medial Lower Leg: Mepitel One Contact layer - under hydrafera blue Secondary Dressing: Wound #1 Left,Medial Lower Leg: Boardered Foam Dressing Dressing Change Frequency: Wound #1 Left,Medial Lower Leg: Change dressing every other day. Follow-up Appointments: Wound #1 Left,Medial Lower Leg: Return Appointment in 1 week. Edema Control: Wound #1 Left,Medial Lower Leg: Patient to wear  own compression stockings Elevate legs to the level of the heart and pump ankles as often as possible Home Health: Wound #1 Left,Medial Lower Leg: Continue Home Health Visits Home Health Nurse may visit PRN to address patient s wound care needs. FACE TO FACE ENCOUNTER: MEDICARE and MEDICAID PATIENTS: I certify that this patient is under my care and that I had a face-to-face encounter that meets the physician face-to-face encounter requirements with this patient on this date. The encounter with the patient was in whole or in part for the following MEDICAL CONDITION: (primary reason for Home Healthcare) MEDICAL NECESSITY: I certify, that based on my findings, NURSING services are a medically necessary home health service. HOME BOUND STATUS: I certify that my clinical findings support that this patient is homebound (i.e., Due to illness or injury, pt requires aid of supportive devices such as crutches, cane, wheelchairs, walkers, the use of special transportation or the assistance of another person to leave their place of residence. There is a normal inability to leave the home and doing so requires considerable and taxing effort. Other absences are for medical reasons / religious services and are infrequent or of short duration when for other reasons). If current dressing causes regression in wound condition, may D/C ordered dressing product/s and apply Normal Saline Moist Dressing daily until next Wound Healing Center / Other MD appointment. Notify Wound Healing Center of regression in wound condition at [phone]. Please direct any NON-WOUND related issues/requests for orders to patient's Primary Care Physician #1 we continue to use [facility], Becka ([id number]) #2 apparently Hydrofera Blue was getting stuck on the wound we therefore used Mepitel as a contact layer covered with border foamline #3 the dressing is being changed by home health Electronic Signature(s) Signed:  12/14/2017 5:10:30 PM By: Baltazar Najjar MD Entered By: Baltazar Najjar on 12/14/2017 12:32:25 Zoe Robinson ([id number]) -------------------------------------------------------------------------------- SuperBill Details Patient Name: Zoe Robinson Date of Service: 12/14/2017 Medical Record Number: [id number] Patient Account Number: [id number] Date of Birth/Sex: May 03, 1932 (85 y.o. F) Treating RN: Huel Coventry Primary Care Provider: Gabriel Cirri Other Clinician: Referring Provider: Gabriel Cirri Treating Provider/Extender:  ROBSON, MICHAEL G We[place] in Treatment: 14 Diagnosis Coding ICD-10 Codes Code Description [phone] 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 muscle I87.312 Chronic venous hypertension (idiopathic) with ulcer of left lower extremity M05.80 Other rheumatoid arthritis with rheumatoid factor of unspecified site Facility Procedures CPT4 Code: [id number] Description: [phone] - WOUND CARE VISIT-LEV 2 EST PT Modifier: Quantity: 1 Physician Procedures CPT4 Code: 7846962 Description: 95284 - WC PHYS LEVEL 2 - EST PT ICD-10 Diagnosis Description 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

## 2017-12-16 NOTE — Progress Notes (Signed)
Zoe Robinson, Zoe Robinson ([id number]) Visit Report for 12/14/2017 Arrival Information Details Patient Name: Zoe Robinson, Zoe Robinson Date of Service: 12/14/2017 11:15 AM Medical Record Number: [id number] Patient Account Number: [id number] Date of Birth/Sex: September 09, 1931 (85 y.o. F) Treating RN: Renne Crigler Primary Care Mitsuye Schrodt: Gabriel Cirri Other Clinician: Referring Topaz Raglin: Gabriel Cirri Treating Melia Hopes/Extender: Altamese [place] in Treatment: 14 Visit Information History Since Last Visit All ordered tests and consults were completed: No Patient Arrived: Ambulatory Added or deleted any medications: No Arrival Time: 11:42 Any new allergies or adverse reactions: No Accompanied By: dtr Had a fall or experienced change in No Transfer Assistance: None activities of daily living that may affect Patient Identification Verified: Yes risk of falls: Secondary Verification Process Yes Signs or symptoms of abuse/neglect since last visito No Completed: Hospitalized since last visit: No Patient Requires Transmission-Based No Implantable device outside of the clinic excluding No Precautions: cellular tissue based products placed in the center Patient Has Alerts: Yes since last visit: Patient Alerts: Patient on Blood Pain Present Now: No Thinner xarelto Electronic Signature(s) Signed: 12/14/2017 4:05:18 PM By: Renne Crigler Entered By: Renne Crigler on 12/14/2017 11:43:14 Zoe Robinson ([id number]) -------------------------------------------------------------------------------- Clinic Level of Care Assessment Details Patient Name: Zoe Robinson Date of Service: 12/14/2017 11:15 AM Medical Record Number: [id number] Patient Account Number: [id number] Date of Birth/Sex: [birth year] (85 y.o. F) Treating RN: Huel Coventry Primary Care Bradyn Soward: Gabriel Cirri Other Clinician: Referring Rodd Heft: Gabriel Cirri Treating Mariaha Ellington/Extender: Altamese [place] in Treatment:  14 Clinic Level of Care Assessment Items TOOL 4 Quantity Score []  - Use when only an EandM is performed on FOLLOW-UP visit 0 ASSESSMENTS - Nursing Assessment / Reassessment []  - Reassessment of Co-morbidities (includes updates in patient status) 0 X- 1 5 Reassessment of Adherence to Treatment Plan ASSESSMENTS - Wound and Skin Assessment / Reassessment X - Simple Wound Assessment / Reassessment - one wound 1 5 []  - 0 Complex Wound Assessment / Reassessment - multiple wounds []  - 0 Dermatologic / Skin Assessment (not related to wound area) ASSESSMENTS - Focused Assessment []  - Circumferential Edema Measurements - multi extremities 0 []  - 0 Nutritional Assessment / Counseling / Intervention []  - 0 Lower Extremity Assessment (monofilament, tuning fork, pulses) []  - 0 Peripheral Arterial Disease Assessment (using hand held doppler) ASSESSMENTS - Ostomy and/or Continence Assessment and Care []  - Incontinence Assessment and Management 0 []  - 0 Ostomy Care Assessment and Management (repouching, etc.) PROCESS - Coordination of Care X - Simple Patient / Family Education for ongoing care 1 15 []  - 0 Complex (extensive) Patient / Family Education for ongoing care []  - 0 Staff obtains , Records, Test Results / Process Orders []  - 0 Staff telephones HHA, Nursing Homes / Clarify orders / etc []  - 0 Routine Transfer to another Facility (non-emergent condition) []  - 0 Routine Hospital Admission (non-emergent condition) []  - 0 New Admissions / / Ordering NPWT, Apligraf, etc. []  - 0 Emergency Hospital Admission (emergent condition) X- 1 10 Simple Discharge Coordination Zoe Robinson, Zoe Robinson ( ) []  - 0 Complex (extensive) Discharge Coordination PROCESS - Special Needs []  - Pediatric / Minor Patient Management 0 []  - 0 Isolation Patient Management []  - 0 Hearing / Language / Visual special needs []  - 0 Assessment of Community assistance  (transportation, D/C planning, etc.) []  - 0 Additional assistance / Altered mentation []  - 0 Support Surface(s) Assessment (bed, cushion, seat, etc.) INTERVENTIONS - Wound Cleansing / Measurement X - Simple Wound Cleansing - one wound 1 5 []  - 0  Complex Wound Cleansing - multiple wounds X- 1 5 Wound Imaging (photographs - any number of wounds) []  - 0 Wound Tracing (instead of photographs) X- 1 5 Simple Wound Measurement - one wound []  - 0 Complex Wound Measurement - multiple wounds INTERVENTIONS - Wound Dressings []  - Small Wound Dressing one or multiple wounds 0 X- 1 15 Medium Wound Dressing one or multiple wounds []  - 0 Large Wound Dressing one or multiple wounds []  - 0 Application of Medications - topical []  - 0 Application of Medications - injection INTERVENTIONS - Miscellaneous []  - External ear exam 0 []  - 0 Specimen Collection (cultures, biopsies, blood, body fluids, etc.) []  - 0 Specimen(s) / Culture(s) sent or taken to Lab for analysis []  - 0 Patient Transfer (multiple staff / Nurse, adult / Similar devices) []  - 0 Simple Staple / Suture removal (25 or less) []  - 0 Complex Staple / Suture removal (26 or more) []  - 0 Hypo / Hyperglycemic Management (close monitor of Blood Glucose) []  - 0 Ankle / Brachial Index (ABI) - do not check if billed separately X- 1 5 Vital Signs Zoe Robinson, Zoe Robinson ([id number]) Has the patient been seen at the hospital within the last three years: Yes Total Score: 70 Level Of Care: New/Established - Level 2 Electronic Signature(s) Signed: 12/14/2017 5:19:09 PM By: Elliot Gurney, BSN, RN, CWS, Kim RN, BSN Entered By: Elliot Gurney, BSN, RN, CWS, Kim on 12/14/2017 13:34:25 Zoe Robinson ([id number]) -------------------------------------------------------------------------------- Encounter Discharge Information Details Patient Name: Zoe Robinson Date of Service: 12/14/2017 11:15 AM Medical Record Number: [id number] Patient Account Number:  [id number] Date of Birth/Sex: 03-18-1932 (85 y.o. F) Treating RN: Huel Coventry Primary Care Esaiah Wanless: Gabriel Cirri Other Clinician: Referring Kenniya Westrich: Gabriel Cirri Treating Adream Parzych/Extender: Altamese [place] in Treatment: 14 Encounter Discharge Information Items Discharge Condition: Stable Ambulatory Status: Ambulatory Discharge Destination: Home Transportation: Private Auto Accompanied By: daughter Schedule Follow-up Appointment: Yes Clinical Summary of Care: Electronic Signature(s) Signed: 12/14/2017 12:50:54 PM By: Elliot Gurney, BSN, RN, CWS, Kim RN, BSN Entered By: Elliot Gurney, BSN, RN, CWS, Kim on 12/14/2017 12:50:54 Zoe Robinson ([id number]) -------------------------------------------------------------------------------- Lower Extremity Assessment Details Patient Name: Zoe Robinson Date of Service: 12/14/2017 11:15 AM Medical Record Number: [id number] Patient Account Number: [id number] Date of Birth/Sex: 1932/03/08 (85 y.o. F) Treating RN: Renne Crigler Primary Care Tyeshia Cornforth: Gabriel Cirri Other Clinician: Referring Preslynn Bier: Gabriel Cirri Treating Kiana Hollar/Extender: Altamese [place] in Treatment: 14 Edema Assessment Assessed: [Left: No] [Right: No] Edema: [Left: N] [Right: o] Vascular Assessment Claudication: Claudication Assessment [Left:None] Pulses: Dorsalis Pedis Palpable: [Left:Yes] Posterior Tibial Extremity colors, hair growth, and conditions: Extremity Color: [Left:Normal] Hair Growth on Extremity: [Left:No] Temperature of Extremity: [Left:Warm] Capillary Refill: [Left:< 3 seconds] Toe Nail Assessment Left: Right: Thick: Yes Discolored: Yes Deformed: Yes Improper Length and Hygiene: Yes Electronic Signature(s) Signed: 12/14/2017 4:05:18 PM By: Renne Crigler Entered By: Renne Crigler on 12/14/2017 11:46:31 Zoe Robinson ([id number]) -------------------------------------------------------------------------------- Multi  Wound Chart Details Patient Name: Zoe Robinson Date of Service: 12/14/2017 11:15 AM Medical Record Number: [id number] Patient Account Number: [id number] Date of Birth/Sex: 1931/08/04 (85 y.o. F) Treating RN: Huel Coventry Primary Care Candelario Steppe: Gabriel Cirri Other Clinician: Referring Priscille Shadduck: Gabriel Cirri Treating Nazareth Kirk/Extender: Altamese  in Treatment: 14 Vital Signs Height(in): 59 Pulse(bpm): 75 Weight(lbs): 117 Blood Pressure(mmHg): 120/55 Body Mass Index(BMI): 24 Temperature(F): 98.4 Respiratory Rate 16 (breaths/min): Photos: [1:No Photos] [N/A:N/A] Wound Location: [1:Left Lower Leg - Medial] [N/A:N/A] Wounding Event: [1:Trauma] [N/A:N/A] Primary Etiology: [1:Venous Leg Ulcer] [N/A:N/A] Comorbid History: [1:Cataracts, Anemia, Arrhythmia, Hypertension, Rheumatoid Arthritis, Osteoarthritis] [  N/A:N/A] Date Acquired: [1:05/02/2017] [N/A:N/A] Weeks of Treatment: [1:14] [N/A:N/A] Wound Status: [1:Open] [N/A:N/A] Measurements L x W x D [1:1.5x0.6x0.3] [N/A:N/A] (cm) Area (cm) : [1:0.707] [N/A:N/A] Volume (cm) : [1:0.212] [N/A:N/A] % Reduction in Area: [1:90.60%] [N/A:N/A] % Reduction in Volume: [1:72.00%] [N/A:N/A] Classification: [1:Full Thickness Without Exposed Support Structures] [N/A:N/A] Exudate Amount: [1:Medium] [N/A:N/A] Exudate Type: [1:Serous] [N/A:N/A] Exudate Color: [1:amber] [N/A:N/A] Wound Margin: [1:Flat and Intact] [N/A:N/A] Granulation Amount: [1:Large (67-100%)] [N/A:N/A] Granulation Quality: [1:Red, Pink, Hyper-granulation] [N/A:N/A] Necrotic Amount: [1:Small (1-33%)] [N/A:N/A] Exposed Structures: [1:Fat Layer (Subcutaneous Tissue) Exposed: Yes Fascia: No Tendon: No Muscle: No Joint: No Bone: No] [N/A:N/A] Epithelialization: [1:Small (1-33%)] [N/A:N/A] Periwound Skin Texture: [1:Excoriation: No Induration: No Callus: No] [N/A:N/A] Crepitus: No Rash: No Scarring: No Periwound Skin Moisture: Maceration: No N/A  N/A Dry/Scaly: No Periwound Skin Color: Atrophie Blanche: No N/A N/A Cyanosis: No Ecchymosis: No Erythema: No Hemosiderin Staining: No Mottled: No Pallor: No Rubor: No Temperature: No Abnormality N/A N/A Tenderness on Palpation: No N/A N/A Wound Preparation: Ulcer Cleansing: N/A N/A Rinsed/Irrigated with Saline Topical Anesthetic Applied: Other: lidocaine 4% Treatment Notes Electronic Signature(s) Signed: 12/14/2017 5:10:30 PM By: Baltazar Najjar MD Entered By: Baltazar Najjar on 12/14/2017 12:24:33 Zoe Robinson ([id number]) -------------------------------------------------------------------------------- Multi-Disciplinary Care Plan Details Patient Name: Zoe Robinson Date of Service: 12/14/2017 11:15 AM Medical Record Number: [id number] Patient Account Number: [id number] Date of Birth/Sex: [birth year] (85 y.o. F) Treating RN: Huel Coventry Primary Care Athea Haley: Gabriel Cirri Other Clinician: Referring Darral Rishel: Gabriel Cirri Treating Jermine Bibbee/Extender: Altamese [place] in Treatment: 14 Active Inactive ` Abuse / Safety / Falls / Self Care Management Nursing Diagnoses: Potential for falls Goals: Patient will not experience any injury related to falls Date Initiated: 09/07/2017 Target Resolution Date: 12/03/2017 Goal Status: Active Interventions: Assess fall risk on admission and as needed Notes: ` Orientation to the Wound Care Program Nursing Diagnoses: Knowledge deficit related to the wound healing center program Goals: Patient/caregiver will verbalize understanding of the Wound Healing Center Program Date Initiated: 09/07/2017 Target Resolution Date: 12/03/2017 Goal Status: Active Interventions: Provide education on orientation to the wound center Notes: ` Wound/Skin Impairment Nursing Diagnoses: Impaired tissue integrity Goals: Ulcer/skin breakdown will heal within 14 weeks Date Initiated: 09/07/2017 Target Resolution Date: 12/03/2017 Goal  Status: Active Interventions: Zoe Robinson, Zoe Robinson ([id number]) Assess patient/caregiver ability to obtain necessary supplies Assess patient/caregiver ability to perform ulcer/skin care regimen upon admission and as needed Assess ulceration(s) every visit Notes: Electronic Signature(s) Signed: 12/14/2017 5:19:09 PM By: Elliot Gurney, BSN, RN, CWS, Kim RN, BSN Entered By: Elliot Gurney, BSN, RN, CWS, Kim on 12/14/2017 11:48:45 Zoe Robinson ([id number]) -------------------------------------------------------------------------------- Pain Assessment Details Patient Name: Zoe Robinson Date of Service: 12/14/2017 11:15 AM Medical Record Number: [id number] Patient Account Number: [id number] Date of Birth/Sex: 08-27-1931 (85 y.o. F) Treating RN: Renne Crigler Primary Care Harika Laidlaw: Gabriel Cirri Other Clinician: Referring Rosamond Andress: Gabriel Cirri Treating Serrita Lueth/Extender: Altamese [place] in Treatment: 14 Active Problems Location of Pain Severity and Description of Pain Patient Has Paino No Site Locations Pain Management and Medication Current Pain Management: Electronic Signature(s) Signed: 12/14/2017 4:05:18 PM By: Renne Crigler Entered By: Renne Crigler on 12/14/2017 11:43:20 Zoe Robinson ([id number]) -------------------------------------------------------------------------------- Patient/Caregiver Education Details Patient Name: Zoe Robinson Date of Service: 12/14/2017 11:15 AM Medical Record Number: [id number] Patient Account Number: [id number] Date of Birth/Gender: 01/13/1932 (85 y.o. F) Treating RN: Huel Coventry Primary Care Physician: Gabriel Cirri Other Clinician: Referring Physician: Gabriel Cirri Treating Physician/Extender: Altamese [place] in Treatment: 14 Education Assessment Education Provided To: Patient Education Topics Provided Wound/Skin Impairment: Handouts:  Caring for Your Ulcer Methods: Demonstration, Explain/Verbal Responses:  State content correctly Electronic Signature(s) Signed: 12/14/2017 5:19:09 PM By: Elliot Gurney, BSN, RN, CWS, Kim RN, BSN Entered By: Elliot Gurney, BSN, RN, CWS, Kim on 12/14/2017 12:51:10 Zoe Robinson ([id number]) -------------------------------------------------------------------------------- Wound Assessment Details Patient Name: Zoe Robinson Date of Service: 12/14/2017 11:15 AM Medical Record Number: [id number] Patient Account Number: [id number] Date of Birth/Sex: 1932/02/25 (85 y.o. F) Treating RN: Renne Crigler Primary Care Kinnie Kaupp: Gabriel Cirri Other Clinician: Referring Jakiah Goree: Gabriel Cirri Treating Kyre Jeffries/Extender: Altamese [place] in Treatment: 14 Wound Status Wound Number: 1 Primary Venous Leg Ulcer Etiology: Wound Location: Left Lower Leg - Medial Wound Open Wounding Event: Trauma Status: Date Acquired: 05/02/2017 Comorbid Cataracts, Anemia, Arrhythmia, Hypertension, Weeks Of Treatment: 14 History: Rheumatoid Arthritis, Osteoarthritis Clustered Wound: No Wound Measurements Length: (cm) 1.5 Width: (cm) 0.6 Depth: (cm) 0.3 Area: (cm) 0.707 Volume: (cm) 0.212 % Reduction in Area: 90.6% % Reduction in Volume: 72% Epithelialization: Small (1-33%) Tunneling: No Undermining: No Wound Description Full Thickness Without Exposed Support Classification: Structures Wound Margin: Flat and Intact Exudate Medium Amount: Exudate Type: Serous Exudate Color: amber Foul Odor After Cleansing: No Slough/Fibrino Yes Wound Bed Granulation Amount: Large (67-100%) Exposed Structure Granulation Quality: Red, Pink, Hyper-granulation Fascia Exposed: No Necrotic Amount: Small (1-33%) Fat Layer (Subcutaneous Tissue) Exposed: Yes Necrotic Quality: Adherent Slough Tendon Exposed: No Muscle Exposed: No Joint Exposed: No Bone Exposed: No Periwound Skin Texture Texture Color No Abnormalities Noted: No No Abnormalities Noted: No Callus: No Atrophie Blanche:  No Crepitus: No Cyanosis: No Excoriation: No Ecchymosis: No Induration: No Erythema: No Rash: No Hemosiderin Staining: No Scarring: No Mottled: No Pallor: No Moisture Rubor: No No Abnormalities Noted: No Dry / Scaly: No Temperature / Pain Zoe Robinson, Zoe Robinson ([id number]) Maceration: No Temperature: No Abnormality Wound Preparation Ulcer Cleansing: Rinsed/Irrigated with Saline Topical Anesthetic Applied: Other: lidocaine 4%, Treatment Notes Wound #1 (Left, Medial Lower Leg) 1. Cleansed with: Clean wound with Normal Saline 4. Dressing Applied: Other dressing (specify in notes) 5. Secondary Dressing Applied Bordered Foam Dressing Notes mepitel under Hydrofera blue Electronic Signature(s) Signed: 12/14/2017 4:05:18 PM By: Renne Crigler Entered By: Renne Crigler on 12/14/2017 11:45:57 Zoe Robinson ([id number]) -------------------------------------------------------------------------------- Vitals Details Patient Name: Zoe Robinson Date of Service: 12/14/2017 11:15 AM Medical Record Number: [id number] Patient Account Number: [id number] Date of Birth/Sex: 1931-10-23 (85 y.o. F) Treating RN: Renne Crigler Primary Care Kharisma Glasner: Gabriel Cirri Other Clinician: Referring Monzerrat Wellen: Gabriel Cirri Treating Druanne Bosques/Extender: Altamese [place] in Treatment: 14 Vital Signs Time Taken: 11:43 Temperature (F): 98.4 Height (in): 59 Pulse (bpm): 75 Weight (lbs): 117 Respiratory Rate (breaths/min): 16 Body Mass Index (BMI): 23.6 Blood Pressure (mmHg): 120/55 Reference Range: 80 - 120 mg / dl Electronic Signature(s) Signed: 12/14/2017 4:05:18 PM By: Renne Crigler Entered By: Renne Crigler on 12/14/2017 11:43:39

## 2017-12-22 NOTE — Progress Notes (Deleted)
Office Visit Note  Patient: Zoe Robinson             Date of Birth: [birth year]           MRN: [id number]             PCP: Gabriel Cirri, NP Referring: Gabriel Cirri, NP Visit Date: 01/04/2018 Occupation: @GUAROCC @    Subjective:  No chief complaint on file.   History of Present Illness: Zoe Robinson is a 84 y.o. female ***   Activities of Daily Living:  Patient reports morning stiffness for *** {minute/hour:19697}.   Patient {ACTIONS;DENIES/REPORTS:21021675::"Denies"} nocturnal pain.  Difficulty dressing/grooming: {ACTIONS;DENIES/REPORTS:21021675::"Denies"} Difficulty climbing stairs: {ACTIONS;DENIES/REPORTS:21021675::"Denies"} Difficulty getting out of chair: {ACTIONS;DENIES/REPORTS:21021675::"Denies"} Difficulty using hands for taps, buttons, cutlery, and/or writing: {ACTIONS;DENIES/REPORTS:21021675::"Denies"}   No Rheumatology ROS completed.   PMFS History:  Patient Active Problem List   Diagnosis Date Noted  . High risk medication use 03/25/2017  . Rheumatoid arthritis involving multiple sites with positive rheumatoid factor (HCC) 03/15/2017  . Rheumatoid arthritis involving both knees with positive rheumatoid factor (HCC) 03/15/2017  . Rheumatoid arthritis involving both hands with positive rheumatoid factor (HCC) 03/15/2017  . Rheumatoid arthritis involving both feet with positive rheumatoid factor (HCC) 03/15/2017  . History of vertebral fracture 03/15/2017  . On prednisone therapy 02/10/2017  . Age-related osteoporosis without current pathological fracture 02/10/2017  . History of pulmonary hypertension 02/10/2017  . History of atrial fibrillation 02/10/2017  . History of anemia 02/10/2017  . History of recurrent UTIs 02/10/2017  . History of hypercalcemia 02/10/2017  . On anticoagulant therapy 02/10/2017  . Acute encephalopathy 08/19/2014  . Neutropenia (HCC) 04/20/2014  . Fever, unspecified 04/20/2014  . Unspecified constipation 04/20/2014  .  Hypercalcemia 04/15/2014  . Acute renal failure (HCC) 04/15/2014  . Generalized weakness 04/15/2014  . Dehydration 04/15/2014  . UTI (urinary tract infection) 04/15/2014  . Iron deficiency anemia 04/15/2014  . CKD (chronic kidney disease)   . Hypertension   . Atrial fibrillation [facility])     Past Medical History:  Diagnosis Date  . Arthritis   . Atrial fibrillation (HCC)   . CKD (chronic kidney disease)   . Hypertension   . RA (rheumatoid arthritis) (HCC)     Family History  Problem Relation Age of Onset  . Heart attack Mother   . Heart attack Father   . Heart attack Brother   . Heart attack Brother   . Stomach cancer Brother   . Cancer Sister   . Dementia Sister   . Osteoarthritis Daughter   . Depression Daughter   . Hypothyroidism Daughter   . Hypercholesterolemia Daughter   . Heart disease Son   . Hypertension Son   . Gout Son    Past Surgical History:  Procedure Laterality Date  . ABDOMINAL HYSTERECTOMY     Social History   Social History Narrative  . Not on file     Objective: Vital Signs: There were no vitals taken for this visit.   Physical Exam   Musculoskeletal Exam: ***  CDAI Exam: No CDAI exam completed.    Investigation: No additional findings. CBC Latest Ref Rng & Units 08/01/2017 07/11/2017 06/23/2017  WBC 3.8 - 10.8 Thousand/uL 8.2 8.9 9.9  Hemoglobin 11.7 - 15.5 g/dL 10.9(L) 10.0(L) 10.1(L)  Hematocrit 35.0 - 45.0 % 31.9(L) 29.3(L) 29.9(L)  Platelets 140 - 400 Thousand/uL 370 363 421(H)   CMP Latest Ref Rng & Units 08/01/2017 07/11/2017 06/23/2017  Glucose 65 - 99 mg/dL 81 06/25/2017) 030(S)  BUN 7 -  25 mg/dL 19 54(M) 24  Creatinine 0.60 - 0.88 mg/dL 0.86 7.61 9.50(D)  Sodium 135 - 146 mmol/L 132(L) 133(L) 134(L)  Potassium 3.5 - 5.3 mmol/L 5.0 4.7 5.1  Chloride 98 - 110 mmol/L 96(L) 95(L) 97(L)  CO2 20 - 32 mmol/L 29 29 30   Calcium 8.6 - 10.4 mg/dL 9.7 9.3 9.7  Total Protein 6.1 - 8.1 g/dL 7.3 6.8 7.1  Total Bilirubin 0.2 - 1.2 mg/dL 0.4  0.3 0.3  Alkaline Phos 39 - 117 U/L - - -  AST 10 - 35 U/L 18 18 18   ALT 6 - 29 U/L 11 11 12     Imaging: No results found.  Speciality Comments: No specialty comments available.    Procedures:  No procedures performed Allergies: Patient has no known allergies.   Assessment / Plan:     Visit Diagnoses: No diagnosis found.    Orders: No orders of the defined types were placed in this encounter.  No orders of the defined types were placed in this encounter.   Face-to-face time spent with patient was *** minutes. 50% of time was spent in counseling and coordination of care.  Follow-Up Instructions: No follow-ups on file.   , CMA  Note - This record has been created using .  Chart creation errors have been sought, but may not always  have been located. Such creation errors do not reflect on  the standard of medical care.

## 2017-12-28 ENCOUNTER — Encounter: Payer: Medicare Other | Attending: Internal Medicine | Admitting: Internal Medicine

## 2017-12-28 DIAGNOSIS — I499 Cardiac arrhythmia, unspecified: Secondary | ICD-10-CM | POA: Diagnosis not present

## 2017-12-28 DIAGNOSIS — M069 Rheumatoid arthritis, unspecified: Secondary | ICD-10-CM | POA: Diagnosis not present

## 2017-12-28 DIAGNOSIS — Z87891 Personal history of nicotine dependence: Secondary | ICD-10-CM | POA: Diagnosis not present

## 2017-12-28 DIAGNOSIS — L03116 Cellulitis of left lower limb: Secondary | ICD-10-CM | POA: Diagnosis present

## 2017-12-28 DIAGNOSIS — E11622 Type 2 diabetes mellitus with other skin ulcer: Secondary | ICD-10-CM | POA: Insufficient documentation

## 2017-12-28 DIAGNOSIS — Z7901 Long term (current) use of anticoagulants: Secondary | ICD-10-CM | POA: Diagnosis not present

## 2017-12-28 DIAGNOSIS — M810 Age-related osteoporosis without current pathological fracture: Secondary | ICD-10-CM | POA: Insufficient documentation

## 2017-12-28 DIAGNOSIS — L97223 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 muscle: Secondary | ICD-10-CM | POA: Diagnosis not present

## 2017-12-28 DIAGNOSIS — I10 Essential (primary) hypertension: Secondary | ICD-10-CM | POA: Diagnosis not present

## 2017-12-30 NOTE — Progress Notes (Signed)
[name], [name] ([id number]) Visit Report for 12/28/2017 Arrival Information Details Patient Name: Zoe, Robinson Date of Service: 12/28/2017 11:15 AM Medical Record Number: [id number] Patient Account Number: [id number] Date of Birth/Sex: 1931/10/28 (84 y.o. F) Treating RN: Renne Crigler Primary Care Lorana Maffeo: Gabriel Cirri Other Clinician: Referring Westyn Driggers: Gabriel Cirri Treating Ruhan Borak/Extender: Altamese [place] in Treatment: 16 Visit Information History Since Last Visit All ordered tests and consults were completed: No Patient Arrived: Ambulatory Added or deleted any medications: No Arrival Time: 11:18 Any new allergies or adverse reactions: No Accompanied By: daughter Had a fall or experienced change in No Transfer Assistance: None activities of daily living that may affect Patient Identification Verified: Yes risk of falls: Secondary Verification Process Yes Signs or symptoms of abuse/neglect since last visito No Completed: Hospitalized since last visit: No Patient Requires Transmission-Based No Implantable device outside of the clinic excluding No Precautions: cellular tissue based products placed in the center Patient Has Alerts: Yes since last visit: Patient Alerts: Patient on Blood Pain Present Now: No Thinner xarelto Electronic Signature(s) Signed: 12/28/2017 5:08:02 PM By: Renne Crigler Entered By: Renne Crigler on 12/28/2017 11:20:48 Zoe Robinson ([id number]) -------------------------------------------------------------------------------- Encounter Discharge Information Details Patient Name: Zoe Robinson Date of Service: 12/28/2017 11:15 AM Medical Record Number: [id number] Patient Account Number: [id number] Date of Birth/Sex: [birth year] (84 y.o. F) Treating RN: Curtis Sites Primary Care Woodie Degraffenreid: Gabriel Cirri Other Clinician: Referring Ephraim Reichel: Gabriel Cirri Treating Adeline Petitfrere/Extender: Altamese [place] in  Treatment: 16 Encounter Discharge Information Items Discharge Condition: Stable Ambulatory Status: Ambulatory Discharge Destination: Home Transportation: Private Auto Accompanied By: dtr Schedule Follow-up Appointment: Yes Clinical Summary of Care: Electronic Signature(s) Signed: 12/28/2017 1:00:30 PM By: Curtis Sites Entered By: Curtis Sites on 12/28/2017 13:00:30 Zoe Robinson ([id number]) -------------------------------------------------------------------------------- Lower Extremity Assessment Details Patient Name: Zoe Robinson Date of Service: 12/28/2017 11:15 AM Medical Record Number: [id number] Patient Account Number: [id number] Date of Birth/Sex: 17-Jan-1932 (84 y.o. F) Treating RN: Renne Crigler Primary Care Sadey Yandell: Gabriel Cirri Other Clinician: Referring Kristofor Michalowski: Gabriel Cirri Treating Karryn Kosinski/Extender: Altamese [place] in Treatment: 16 Edema Assessment Assessed: [Left: No] [Right: No] Edema: [Left: N] [Right: o] Vascular Assessment Claudication: Claudication Assessment [Left:None] Pulses: Dorsalis Pedis Palpable: [Left:Yes] Posterior Tibial Extremity colors, hair growth, and conditions: Extremity Color: [Left:Normal] Hair Growth on Extremity: [Left:No] Temperature of Extremity: [Left:Warm] Capillary Refill: [Left:< 3 seconds] Toe Nail Assessment Left: Right: Thick: Yes Discolored: Yes Deformed: Yes Improper Length and Hygiene: Yes Electronic Signature(s) Signed: 12/28/2017 5:08:02 PM By: Renne Crigler Entered By: Renne Crigler on 12/28/2017 11:26:49 Zoe Robinson ([id number]) -------------------------------------------------------------------------------- Multi Wound Chart Details Patient Name: Zoe Robinson Date of Service: 12/28/2017 11:15 AM Medical Record Number: [id number] Patient Account Number: [id number] Date of Birth/Sex: 01/07/1932 (84 y.o. F) Treating RN: Huel Coventry Primary Care Sanela Evola: Gabriel Cirri  Other Clinician: Referring Swade Shonka: Gabriel Cirri Treating Koltin Wehmeyer/Extender: Altamese [place] in Treatment: 16 Vital Signs Height(in): 59 Pulse(bpm): 68 Weight(lbs): 117 Blood Pressure(mmHg): 134/64 Body Mass Index(BMI): 24 Temperature(F): 98.3 Respiratory Rate 16 (breaths/min): Photos: [1:No Photos] [N/A:N/A] Wound Location: [1:Left Lower Leg - Medial] [N/A:N/A] Wounding Event: [1:Trauma] [N/A:N/A] Primary Etiology: [1:Venous Leg Ulcer] [N/A:N/A] Comorbid History: [1:Cataracts, Anemia, Arrhythmia, Hypertension, Rheumatoid Arthritis, Osteoarthritis] [N/A:N/A] Date Acquired: [1:05/02/2017] [N/A:N/A] Weeks of Treatment: [1:16] [N/A:N/A] Wound Status: [1:Open] [N/A:N/A] Measurements L x W x D [1:1.5x0.35x0.32] [N/A:N/A] (cm) Area (cm) : [1:0.412] [N/A:N/A] Volume (cm) : [1:0.132] [N/A:N/A] % Reduction in Area: [1:94.50%] [N/A:N/A] % Reduction in Volume: [1:82.50%] [N/A:N/A] Classification: [1:Full Thickness Without Exposed Support Structures] [N/A:N/A] Exudate Amount: [1:Medium] [N/A:N/A] Exudate  Type: [1:Serous] [N/A:N/A] Exudate Color: [1:amber] [N/A:N/A] Wound Margin: [1:Flat and Intact] [N/A:N/A] Granulation Amount: [1:Large (67-100%)] [N/A:N/A] Granulation Quality: [1:Red, Pink, Hyper-granulation] [N/A:N/A] Necrotic Amount: [1:Small (1-33%)] [N/A:N/A] Exposed Structures: [1:Fat Layer (Subcutaneous Tissue) Exposed: Yes Fascia: No Tendon: No Muscle: No Joint: No Bone: No] [N/A:N/A] Epithelialization: [1:Small (1-33%)] [N/A:N/A] Debridement: [1:Debridement - Selective/Open Wound 11:39] [N/A:N/A N/A] Pre-procedure Verification/Time Out Taken: Tissue Debrided: Necrotic/Eschar N/A N/A Level: Non-Viable Tissue N/A N/A Debridement Area (sq cm): 0.45 N/A N/A Instrument: Curette N/A N/A Bleeding: None N/A N/A Procedural Pain: 0 N/A N/A Post Procedural Pain: 0 N/A N/A Debridement Treatment Procedure was tolerated well N/A N/A Response: Post Debridement  1.5x0.3x0.1 N/A N/A Measurements L x W x D (cm) Post Debridement Volume: 0.035 N/A N/A (cm) Periwound Skin Texture: Excoriation: No N/A N/A Induration: No Callus: No Crepitus: No Rash: No Scarring: No Periwound Skin Moisture: Maceration: No N/A N/A Dry/Scaly: No Periwound Skin Color: Atrophie Blanche: No N/A N/A Cyanosis: No Ecchymosis: No Erythema: No Hemosiderin Staining: No Mottled: No Pallor: No Rubor: No Temperature: No Abnormality N/A N/A Tenderness on Palpation: No N/A N/A Wound Preparation: Ulcer Cleansing: N/A N/A Rinsed/Irrigated with Saline Topical Anesthetic Applied: Other: lidocaine 4% Procedures Performed: CHEM CAUT GRANULATION N/A N/A TISS Debridement Treatment Notes Electronic Signature(s) Signed: 12/28/2017 6:13:14 PM By: Baltazar Najjar MD Entered By: Baltazar Najjar on 12/28/2017 12:12:41 Zoe Robinson ([id number]) -------------------------------------------------------------------------------- Multi-Disciplinary Care Plan Details Patient Name: Zoe Robinson Date of Service: 12/28/2017 11:15 AM Medical Record Number: [id number] Patient Account Number: [id number] Date of Birth/Sex: 01-21-1932 (84 y.o. F) Treating RN: Huel Coventry Primary Care Cailie Bosshart: Gabriel Cirri Other Clinician: Referring Gemayel Mascio: Gabriel Cirri Treating Jahmere Bramel/Extender: Altamese [place] in Treatment: 16 Active Inactive ` Abuse / Safety / Falls / Self Care Management Nursing Diagnoses: Potential for falls Goals: Patient will not experience any injury related to falls Date Initiated: 09/07/2017 Target Resolution Date: 12/03/2017 Goal Status: Active Interventions: Assess fall risk on admission and as needed Notes: ` Orientation to the Wound Care Program Nursing Diagnoses: Knowledge deficit related to the wound healing center program Goals: Patient/caregiver will verbalize understanding of the Wound Healing Center Program Date Initiated:  09/07/2017 Target Resolution Date: 12/03/2017 Goal Status: Active Interventions: Provide education on orientation to the wound center Notes: ` Wound/Skin Impairment Nursing Diagnoses: Impaired tissue integrity Goals: Ulcer/skin breakdown will heal within 14 weeks Date Initiated: 09/07/2017 Target Resolution Date: 12/03/2017 Goal Status: Active Interventions: Zoe, Robinson ([id number]) Assess patient/caregiver ability to obtain necessary supplies Assess patient/caregiver ability to perform ulcer/skin care regimen upon admission and as needed Assess ulceration(s) every visit Notes: Electronic Signature(s) Signed: 12/29/2017 2:15:42 PM By: Elliot Gurney, BSN, RN, CWS, Kim RN, BSN Entered By: Elliot Gurney, BSN, RN, CWS, Kim on 12/28/2017 11:37:21 Zoe Robinson ([id number]) -------------------------------------------------------------------------------- Pain Assessment Details Patient Name: Zoe Robinson Date of Service: 12/28/2017 11:15 AM Medical Record Number: [id number] Patient Account Number: [id number] Date of Birth/Sex: November 04, 1931 (84 y.o. F) Treating RN: Renne Crigler Primary Care Yahye Siebert: Gabriel Cirri Other Clinician: Referring Nelani Schmelzle: Gabriel Cirri Treating Dontray Haberland/Extender: Altamese [place] in Treatment: 16 Active Problems Location of Pain Severity and Description of Pain Patient Has Paino No Site Locations Pain Management and Medication Current Pain Management: Electronic Signature(s) Signed: 12/28/2017 5:08:02 PM By: Renne Crigler Entered By: Renne Crigler on 12/28/2017 11:20:56 Zoe Robinson ([id number]) -------------------------------------------------------------------------------- Patient/Caregiver Education Details Patient Name: Zoe Robinson Date of Service: 12/28/2017 11:15 AM Medical Record Number: [id number] Patient Account Number: [id number] Date of Birth/Gender: 13-May-1932 (84 y.o. F) Treating RN: Curtis Sites Primary Care  Physician:  Lequita Halt, CHRISTI Other Clinician: Referring Physician: Gabriel Cirri Treating Physician/Extender: Altamese [place] in Treatment: 16 Education Assessment Education Provided To: Patient and Caregiver Education Topics Provided Wound/Skin Impairment: Handouts: Other: wound care as ordered Methods: Demonstration, Explain/Verbal Responses: State content correctly Electronic Signature(s) Signed: 12/28/2017 5:11:38 PM By: Curtis Sites Entered By: Curtis Sites on 12/28/2017 13:00:47 Zoe Robinson ([id number]) -------------------------------------------------------------------------------- Wound Assessment Details Patient Name: Zoe Robinson Date of Service: 12/28/2017 11:15 AM Medical Record Number: [id number] Patient Account Number: [id number] Date of Birth/Sex: 02/13/1932 (84 y.o. F) Treating RN: Renne Crigler Primary Care Sabien Umland: Gabriel Cirri Other Clinician: Referring Vana Arif: Gabriel Cirri Treating Ridgely Anastacio/Extender: Altamese [place] in Treatment: 16 Wound Status Wound Number: 1 Primary Venous Leg Ulcer Etiology: Wound Location: Left Lower Leg - Medial Wound Open Wounding Event: Trauma Status: Date Acquired: 05/02/2017 Comorbid Cataracts, Anemia, Arrhythmia, Hypertension, Weeks Of Treatment: 16 History: Rheumatoid Arthritis, Osteoarthritis Clustered Wound: No Photos Photo Uploaded By: Renne Crigler on 12/28/2017 17:12:32 Wound Measurements Length: (cm) 1.5 Width: (cm) 0.35 Depth: (cm) 0.32 Area: (cm) 0.412 Volume: (cm) 0.132 % Reduction in Area: 94.5% % Reduction in Volume: 82.5% Epithelialization: Small (1-33%) Tunneling: No Undermining: No Wound Description Full Thickness Without Exposed Support Classification: Structures Wound Margin: Flat and Intact Exudate Medium Amount: Exudate Type: Serous Exudate Color: amber Foul Odor After Cleansing: No Slough/Fibrino Yes Wound Bed Granulation Amount: Large  (67-100%) Exposed Structure Granulation Quality: Red, Pink, Hyper-granulation Fascia Exposed: No Necrotic Amount: Small (1-33%) Fat Layer (Subcutaneous Tissue) Exposed: Yes Necrotic Quality: Adherent Slough Tendon Exposed: No Muscle Exposed: No Joint Exposed: No Bone Exposed: No Shimer, Xylina ([id number]) Periwound Skin Texture Texture Color No Abnormalities Noted: No No Abnormalities Noted: No Callus: No Atrophie Blanche: No Crepitus: No Cyanosis: No Excoriation: No Ecchymosis: No Induration: No Erythema: No Rash: No Hemosiderin Staining: No Scarring: No Mottled: No Pallor: No Moisture Rubor: No No Abnormalities Noted: No Dry / Scaly: No Temperature / Pain Maceration: No Temperature: No Abnormality Wound Preparation Ulcer Cleansing: Rinsed/Irrigated with Saline Topical Anesthetic Applied: Other: lidocaine 4%, Treatment Notes Wound #1 (Left, Medial Lower Leg) 1. Cleansed with: Clean wound with Normal Saline 2. Anesthetic Topical Lidocaine 4% cream to wound bed prior to debridement 4. Dressing Applied: Hydrafera Blue Mepitel 5. Secondary Dressing Applied Bordered Foam Dressing Notes mepitel under Hydrofera blue Electronic Signature(s) Signed: 12/28/2017 5:08:02 PM By: Renne Crigler Entered By: Renne Crigler on 12/28/2017 11:26:03 Zoe Robinson ([id number]) -------------------------------------------------------------------------------- Vitals Details Patient Name: Zoe Robinson Date of Service: 12/28/2017 11:15 AM Medical Record Number: [id number] Patient Account Number: [id number] Date of Birth/Sex: [birth year] (84 y.o. F) Treating RN: Renne Crigler Primary Care Daryle Amis: Gabriel Cirri Other Clinician: Referring Cole Eastridge: Gabriel Cirri Treating Taishawn Smaldone/Extender: Altamese [place] in Treatment: 16 Vital Signs Time Taken: 11:25 Temperature (F): 98.3 Height (in): 59 Pulse (bpm): 68 Weight (lbs): 117 Respiratory Rate  (breaths/min): 16 Body Mass Index (BMI): 23.6 Blood Pressure (mmHg): 134/64 Reference Range: 80 - 120 mg / dl Electronic Signature(s) Signed: 12/28/2017 5:08:02 PM By: Renne Crigler Entered By: Renne Crigler on 12/28/2017 11:25:31

## 2017-12-30 NOTE — Progress Notes (Signed)
NAVA, SONG ([id number]) Visit Report for 12/28/2017 Debridement Details Patient Name: Zoe Robinson, Zoe Robinson Date of Service: 12/28/2017 11:15 AM Medical Record Number: [id number] Patient Account Number: [id number] Date of Birth/Sex: 1931/10/30 (85 y.o. F) Treating RN: Huel Coventry Primary Care Provider: Gabriel Cirri Other Clinician: Referring Provider: Gabriel Cirri Treating Provider/Extender: Altamese [place] in Treatment: 16 Debridement Performed for Wound #1 Left,Medial Lower Leg Assessment: Performed By: Physician Maxwell Caul, MD Debridement Type: Debridement Severity of Tissue Pre Fat layer exposed Debridement: Pre-procedure Verification/Time Yes - 11:39 Out Taken: Start Time: 11:39 Total Area Debrided (L x W): 1.5 (cm) x 0.3 (cm) = 0.45 (cm) Tissue and other material Non-Viable, Eschar debrided: Level: Non-Viable Tissue Debridement Description: Selective/Open Wound Instrument: Curette Bleeding: None End Time: 11:39 Procedural Pain: 0 Post Procedural Pain: 0 Response to Treatment: Procedure was tolerated well Level of Consciousness: Awake and Alert Post Procedure Vitals: Temperature: 98.3 Pulse: 68 Respiratory Rate: 16 Blood Pressure: Systolic Blood Pressure: 134 Diastolic Blood Pressure: 64 Post Debridement Measurements of Total Wound Length: (cm) 1.5 Width: (cm) 0.3 Depth: (cm) 0.1 Volume: (cm) 0.035 Character of Wound/Ulcer Post Debridement: Stable Severity of Tissue Post Debridement: Fat layer exposed Post Procedure Diagnosis Same as Pre-procedure Electronic Signature(s) Signed: 12/28/2017 6:13:14 PM By: Baltazar Najjar MD Zoe Robinson ([id number]) Signed: 12/29/2017 2:15:42 PM By: Elliot Gurney, BSN, RN, CWS, Kim RN, BSN Entered By: Baltazar Najjar on 12/28/2017 12:12:56 Zoe Robinson ([id number]) -------------------------------------------------------------------------------- HPI Details Patient Name: Zoe Robinson Date of  Service: 12/28/2017 11:15 AM Medical Record Number: [id number] Patient Account Number: [id number] Date of Birth/Sex: 07-09-1932 (85 y.o. F) Treating RN: Huel Coventry Primary Care Provider: Gabriel Cirri Other Clinician: Referring Provider: Gabriel Cirri Treating Provider/Extender: Altamese [place] in Treatment: 16 History of Present Illness HPI Description: 09/07/17; this is an 84 year old woman who arrives accompanied by her daughter. She has a history of severe rheumatoid arthritis followed by Dr. Corliss Skains in [place]. By review of care everywhere it appears that she had cellulitis of her left lower leg in September 2018. This may have been caused by a dog scratch. She was followed and her primary care office at [facility] affiliated group. She received antibiotics, Silvadene cream as well as Una boots. This was followed through late October and the wound was healed out on 10/31. The patient and her daughters state that the current wound started with a scissor injury to the medial left calf before the wound was actually healed out in other words this is a different wound area. The original wound was more medial on the left anterior tibial area. Since then they've been using Silvadene cream and apparently she changed doctors who recommended Polysporin and they've been referred here for the injury on her left medial calf. She does not have a history of rheumatoid-related skin issues/vasculitis The patient is a type II diabetic with most recent hemoglobin A1c of 6. She has advanced seropositive rheumatoid arthritis on prednisone 10 mg alternating with 5 and methotrexate. I see she is also on xarelto while Im not really sure why. She has significant osteoporosis, history of hypercalcemia. ABI in this clinic is 1.08. She was a former smoker 09/14/17; patient's wound slightly smaller today however still covered in a very thick adherent necrotic surface. We have been using Santyl under compression.  We finally got home health to start yesterday "advanced" 09/21/17; not too much change in the area here. We have been using Santyl under compression and I'll continue this after debridement today. Consider Iodoflex if we don't get  a better looking wound surface 09/22/17-she presents today as an urgent follow-up secondary to uncontrolled bleeding overnight. She has saturated through the 3-layer compression. Upon dressing removal there is significant clot formation, she continues to bleed from the most proximal aspect of the ulcer. 4 silver nitrate sticks were used to try to obtain hemostasis, along with 10 minutes of pressure dressing. The wound continues to bleed although not as briskly; this appears to be more of a venous bleed and arterial. She is hemodynamically stable upon presentation to the clinic SBP 130's, HR 80's with no complaints of dizziness, lightheadedness. We are unable to obtain any hemostatic agents from the emergency department. Since she has bled through the pressure dressing and continues to bleed despite all efforts available in the clinic she has been sent to the emergency department for evaluation and treatment 09/28/17; the patient has not had an easy time since the last time I saw her. She continues to have nonviable surface over the wound perhaps slightly better using Santyl. She has on xarelto which no doubt contributes to the bleeding after debridement. I'm going to change her to Iodoflex today. She is having a lot of discomfort with this we gave her tramadol which is reasonable 10/05/17;patient continues to have nonviable surface over a very difficult wound.we also note that her propensity to bleed. We have been using Iodoflex. previously minimal response previously to [employer]. She tells me that her rheumatologist is made adjustments to both her prednisone and methotrexate in attempt to help heal this difficult area. One would wonder if this is all venous insufficiency and  whether the rheumatoid arthritis itself could be contributing to an inflammatory ulcer/rheumatoid vasculitis 10/12/17 She is here in follow up evaluation for a left lower extremity ulcer. There is no improvement. We will transition to santyl. 10/19/17; she has transitioned back to santyl and has been changing this every day. There has been some improvement although still requiring mechanical debridement 10/25/17; still using Santyl on the area on the left lower extremity. There is improvement for the first time. Recently no debridement was felt to be necessary.she did not respond well to Iodoflex. Request for fThera skin was denied by her insurance 11/02/17; still using Santyl. She did not respond well to Iodoflex. We are getting a much better surface that this deep wound. Still requiring debridement.there is no advanced treatment option through her primary insurance [[employer]] 11/09/17; we are still using Santyl. Better looking surface still requiring mechanical debridement. There is on the left medial [place], [place] ([id number]) lower extremity.this initially started at the time of cellulitis in the left leg which may have been caused by a dog scratch. Had a lot of trouble getting in adequate surface on this wound. We did use Iodoflex for a period of time. we have not been able to get a proper surface to consider an alternative dressing 11/16/17; we finally have this wound down to a healthy granulated surface. We can graduate from [facility] to silver collagen. I have again reviewed the history of this apparently the patient had a scratch injury in the lateral aspect of her leg. In the course of wrapping this this was apparently a scissor injury at her primary's office. I'm he got this wrong and the original history 11/23/17; switch to Santyl collagen last week. Arrives today with some slough over the surface over this represents an easy debridement. No change in dimensions. This was a scissor injury  initially. 11/29/17; using collagen since last week. Wound surface is vibrant  today. No change in dimensions however 12/07/17; wound is measuring smaller. Some surface left easily removed with gauze. Some hyper granulation generally the surface looks better.we have been using silver collagen I changed this to [facility] 12/14/17; wound is measuring smaller. Surface looks healthier. We've been using Hydrofera Blue under compression. 655/19; wound measures smaller however hyper granulation in the top 50% of the wound surface and some surrounding callus and skin that needed removing with a #3 curet. Otherwise this looks fairly good Psychologist, [facility]) Signed: 12/28/2017 6:13:14 PM By: Baltazar Najjar MD Entered By: Baltazar Najjar on 12/28/2017 12:17:49 Zoe Robinson ([id number]) -------------------------------------------------------------------------------- Gaynelle Adu TISS Details Patient Name: Zoe Robinson Date of Service: 12/28/2017 11:15 AM Medical Record Number: [id number] Patient Account Number: [id number] Date of Birth/Sex: 08-12-1931 (85 y.o. F) Treating RN: Huel Coventry Primary Care Provider: Gabriel Cirri Other Clinician: Referring Provider: Gabriel Cirri Treating Provider/Extender: Altamese [place] in Treatment: 16 Procedure Performed for: Wound #1 Left,Medial Lower Leg Performed By: Physician Maxwell Caul, MD Post Procedure Diagnosis Same as Pre-procedure Electronic Signature(s) Signed: 12/28/2017 6:13:14 PM By: Baltazar Najjar MD Entered By: Baltazar Najjar on 12/28/2017 12:16:09 Zoe Robinson ([id number]) -------------------------------------------------------------------------------- Physical Exam Details Patient Name: Zoe Robinson Date of Service: 12/28/2017 11:15 AM Medical Record Number: [id number] Patient Account Number: [id number] Date of Birth/Sex: 1931/12/24 (85 y.o. F) Treating RN: Huel Coventry Primary Care Provider: Gabriel Cirri Other Clinician: Referring Provider: Gabriel Cirri Treating Provider/Extender: Altamese [place] in Treatment: 16 Constitutional Sitting or standing Blood Pressure is within target range for patient.. Pulse regular and within target range for patient.Marland Kitchen Respirations regular, non-labored and within target range.. Temperature is normal and within the target range for the patient.Marland Kitchen appears in no distress. Notes wound exam; the patient's wound required movable some hyper-granulation in the superior 50% with silver nitrate. There was also some callus and thick skin that required debridement from around the wound circumference with a #3 curet. There was no bleeding Electronic Signature(s) Signed: 12/28/2017 6:13:14 PM By: Baltazar Najjar MD Entered By: Baltazar Najjar on 12/28/2017 12:20:06 Zoe Robinson ([id number]) -------------------------------------------------------------------------------- Physician Orders Details Patient Name: Zoe Robinson Date of Service: 12/28/2017 11:15 AM Medical Record Number: [id number] Patient Account Number: [id number] Date of Birth/Sex: 17-Jul-1932 (85 y.o. F) Treating RN: Huel Coventry Primary Care Provider: Gabriel Cirri Other Clinician: Referring Provider: Gabriel Cirri Treating Provider/Extender: Altamese [place] in Treatment: 16 Verbal / Phone Orders: No Diagnosis Coding Wound Cleansing Wound #1 Left,Medial Lower Leg o Cleanse wound with mild soap and water Anesthetic (add to Medication List) Wound #1 Left,Medial Lower Leg o Topical Lidocaine 4% cream applied to wound bed prior to debridement (In Clinic Only). Primary Wound Dressing o Hydrafera Blue Ready Transfer Wound #1 Left,Medial Lower Leg o Mepitel One Contact layer - under hydrafera blue Secondary Dressing Wound #1 Left,Medial Lower Leg o Boardered Foam Dressing Dressing Change Frequency Wound #1 Left,Medial Lower Leg o Change dressing every other  day. Follow-up Appointments Wound #1 Left,Medial Lower Leg o Return Appointment in 2 weeks. Edema Control Wound #1 Left,Medial Lower Leg o Patient to wear own compression stockings o Elevate legs to the level of the heart and pump ankles as often as possible Home Health Wound #1 Left,Medial Lower Leg o Continue Home Health Visits o Home Health Nurse may visit PRN to address patientos wound care needs. o FACE TO FACE ENCOUNTER: MEDICARE and MEDICAID PATIENTS: I certify that this patient is under my care and that I had a face-to-face encounter that  meets the physician face-to-face encounter requirements with this patient on this date. The encounter with the patient was in whole or in part for the following MEDICAL CONDITION: (primary reason for Home Healthcare) MEDICAL NECESSITY: I certify, that based on my findings, NURSING services are a medically necessary home health service. HOME BOUND STATUS: I certify that my clinical findings support that this patient is homebound (i.e., Due to illness or injury, pt requires aid of supportive devices such as crutches, cane, wheelchairs, walkers, the use of special transportation or the assistance of another person to leave their place of residence. There is a normal inability to leave the home AKASHA, MELENA ([id number]) and doing so requires considerable and taxing effort. Other absences are for medical reasons / religious services and are infrequent or of short duration when for other reasons). o If current dressing causes regression in wound condition, may D/C ordered dressing product/s and apply Normal Saline Moist Dressing daily until next Wound Healing Center / Other MD appointment. Notify Wound Healing Center of regression in wound condition at [phone]. o Please direct any NON-WOUND related issues/requests for orders to patient's Primary Care Physician Electronic Signature(s) Signed: 12/28/2017 6:13:14 PM By: Baltazar Najjar MD Signed: 12/29/2017 2:15:42 PM By: Elliot Gurney, BSN, RN, CWS, Kim RN, BSN Entered By: Elliot Gurney, BSN, RN, CWS, Kim on 12/28/2017 11:42:26 Zoe Robinson ([id number]) -------------------------------------------------------------------------------- Problem List Details Patient Name: Zoe Robinson Date of Service: 12/28/2017 11:15 AM Medical Record Number: [id number] Patient Account Number: [id number] Date of Birth/Sex: 1932/01/10 (85 y.o. 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 09/07/2017 No Yes I87.312 Chronic venous hypertension (idiopathic) with ulcer of left 09/07/2017 No Yes lower extremity M05.80 Other rheumatoid arthritis with rheumatoid factor of 09/07/2017 No Yes unspecified site Inactive Problems Resolved Problems Electronic Signature(s) Signed: 12/28/2017 6:13:14 PM By: Baltazar Najjar MD Entered By: Baltazar Najjar on 12/28/2017 12:09:12 Zoe Robinson ([id number]) -------------------------------------------------------------------------------- Progress Note Details Patient Name: Zoe Robinson Date of Service: 12/28/2017 11:15 AM Medical Record Number: [id number] Patient Account Number: [id number] Date of Birth/Sex: May 07, 1932 (85 y.o. F) Treating RN: Huel Coventry Primary Care Provider: Gabriel Cirri Other Clinician: Referring Provider: Gabriel Cirri Treating Provider/Extender: Altamese [place] in Treatment: 16 Subjective History of Present Illness (HPI) 09/07/17; this is an 84 year old woman who arrives accompanied by her daughter. She has a history of severe rheumatoid arthritis followed by Dr. Corliss Skains in [place]. By review of care  everywhere it appears that she had cellulitis of her left lower leg in September 2018. This may have been caused by a dog scratch. She was followed and her primary care office at [facility] affiliated group. She received antibiotics, Silvadene cream as well as Una boots. This was followed through late October and the wound was healed out on 10/31. The patient and her daughters state that the current wound started with a scissor injury to the medial left calf before the wound was actually healed out in other words this is a different wound area. The original wound was more medial on the left anterior tibial area. Since then they've been using Silvadene cream and apparently she changed doctors who recommended Polysporin and they've been referred here for the injury on her left medial calf. She does not have a history of rheumatoid-related skin issues/vasculitis The patient  is a type II diabetic with most recent hemoglobin A1c of 6. She has advanced seropositive rheumatoid arthritis on prednisone 10 mg alternating with 5 and methotrexate. I see she is also on xarelto while Im not really sure why. She has significant osteoporosis, history of hypercalcemia. ABI in this clinic is 1.08. She was a former smoker 09/14/17; patient's wound slightly smaller today however still covered in a very thick adherent necrotic surface. We have been using Santyl under compression. We finally got home health to start yesterday "advanced" 09/21/17; not too much change in the area here. We have been using Santyl under compression and I'll continue this after debridement today. Consider Iodoflex if we don't get a better looking wound surface 09/22/17-she presents today as an urgent follow-up secondary to uncontrolled bleeding overnight. She has saturated through the 3-layer compression. Upon dressing removal there is significant clot formation, she continues to bleed from the most proximal aspect of the ulcer. 4 silver nitrate sticks  were used to try to obtain hemostasis, along with 10 minutes of pressure dressing. The wound continues to bleed although not as briskly; this appears to be more of a venous bleed and arterial. She is hemodynamically stable upon presentation to the clinic SBP 130's, HR 80's with no complaints of dizziness, lightheadedness. We are unable to obtain any hemostatic agents from the emergency department. Since she has bled through the pressure dressing and continues to bleed despite all efforts available in the clinic she has been sent to the emergency department for evaluation and treatment 09/28/17; the patient has not had an easy time since the last time I saw her. She continues to have nonviable surface over the wound perhaps slightly better using Santyl. She has on xarelto which no doubt contributes to the bleeding after debridement. I'm going to change her to Iodoflex today. She is having a lot of discomfort with this we gave her tramadol which is reasonable 10/05/17;patient continues to have nonviable surface over a very difficult wound.we also note that her propensity to bleed. We have been using Iodoflex. previously minimal response previously to [employer]. She tells me that her rheumatologist is made adjustments to both her prednisone and methotrexate in attempt to help heal this difficult area. One would wonder if this is all venous insufficiency and whether the rheumatoid arthritis itself could be contributing to an inflammatory ulcer/rheumatoid vasculitis 10/12/17 She is here in follow up evaluation for a left lower extremity ulcer. There is no improvement. We will transition to santyl. 10/19/17; she has transitioned back to santyl and has been changing this every day. There has been some improvement although still requiring mechanical debridement 10/25/17; still using Santyl on the area on the left lower extremity. There is improvement for the first time. Recently no debridement was felt to be  necessary.she did not respond well to Iodoflex. Request for fThera skin was denied by her insurance 11/02/17; still using Santyl. She did not respond well to Iodoflex. We are getting a much better surface that this deep wound. Still requiring debridement.there is no advanced treatment option through her primary insurance [employer] ([id number]) 11/09/17; we are still using Santyl. Better looking surface still requiring mechanical debridement. There is on the left medial lower extremity.this initially started at the time of cellulitis in the left leg which may have been caused by a dog scratch. Had a lot of trouble getting in adequate surface on this wound. We did use Iodoflex for a period of time. we have not been  able to get a proper surface to consider an alternative dressing 11/16/17; we finally have this wound down to a healthy granulated surface. We can graduate from [facility] to silver collagen. I have again reviewed the history of this apparently the patient had a scratch injury in the lateral aspect of her leg. In the course of wrapping this this was apparently a scissor injury at her primary's office. I'm he got this wrong and the original history 11/23/17; switch to Santyl collagen last week. Arrives today with some slough over the surface over this represents an easy debridement. No change in dimensions. This was a scissor injury initially. 11/29/17; using collagen since last week. Wound surface is vibrant today. No change in dimensions however 12/07/17; wound is measuring smaller. Some surface left easily removed with gauze. Some hyper granulation generally the surface looks better.we have been using silver collagen I changed this to [facility] 12/14/17; wound is measuring smaller. Surface looks healthier. We've been using Hydrofera Blue under compression. 655/19; wound measures smaller however hyper granulation in the top 50% of the wound surface and some surrounding callus  and skin that needed removing with a #3 curet. Otherwise this looks fairly good Objective Constitutional Sitting or standing Blood Pressure is within target range for patient.. Pulse regular and within target range for patient.Marland Kitchen Respirations regular, non-labored and within target range.. Temperature is normal and within the target range for the patient.Marland Kitchen appears in no distress. Vitals Time Taken: 11:25 AM, Height: 59 in, Weight: 117 lbs, BMI: 23.6, Temperature: 98.3 F, Pulse: 68 bpm, Respiratory Rate: 16 breaths/min, Blood Pressure: 134/64 mmHg. General Notes: wound exam; the patient's wound required movable some hyper-granulation in the superior 50% with silver nitrate. There was also some callus and thick skin that required debridement from around the wound circumference with a #3 curet. There was no bleeding Integumentary (Hair, Skin) Wound #1 status is Open. Original cause of wound was Trauma. The wound is located on the Left,Medial Lower Leg. The wound measures 1.5cm length x 0.35cm width x 0.32cm depth; 0.412cm^2 area and 0.132cm^3 volume. There is Fat Layer (Subcutaneous Tissue) Exposed exposed. There is no tunneling or undermining noted. There is a medium amount of serous drainage noted. The wound margin is flat and intact. There is large (67-100%) red, pink, hyper - granulation within the wound bed. There is a small (1-33%) amount of necrotic tissue within the wound bed including Adherent Slough. The periwound skin appearance did not exhibit: Callus, Crepitus, Excoriation, Induration, Rash, Scarring, Dry/Scaly, Maceration, Atrophie Blanche, Cyanosis, Ecchymosis, Hemosiderin Staining, Mottled, Pallor, Rubor, Erythema. Periwound temperature was noted as No Abnormality. Assessment JENNIKA, ROSENSTIEL ([id number]) Active Problems ICD-10 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 muscle Chronic venous hypertension (idiopathic) with ulcer of left lower extremity Other  rheumatoid arthritis with rheumatoid factor of unspecified site Procedures Wound #1 Pre-procedure diagnosis of Wound #1 is a Venous Leg Ulcer located on the Left,Medial Lower Leg .Severity of Tissue Pre Debridement is: Fat layer exposed. There was a Selective/Open Wound Non-Viable Tissue Debridement with a total area of 0.45 sq cm performed by Maxwell Caul, MD. With the following instrument(s): Curette to remove Non-Viable tissue/material. Material removed includes Eschar. No specimens were taken. A time out was conducted at 11:39, prior to the start of the procedure. There was no bleeding. The procedure was tolerated well with a pain level of 0 throughout and a pain level of 0 following the procedure. Patient s Level of Consciousness post procedure was recorded  as Awake and Alert. Post Debridement Measurements: 1.5cm length x 0.3cm width x 0.1cm depth; 0.035cm^3 volume. Character of Wound/Ulcer Post Debridement is stable. Severity of Tissue Post Debridement is: Fat layer exposed. Post procedure Diagnosis Wound #1: Same as Pre-Procedure Pre-procedure diagnosis of Wound #1 is a Venous Leg Ulcer located on the Left,Medial Lower Leg . An CHEM CAUT GRANULATION TISS procedure was performed by Maxwell Caul, MD. Post procedure Diagnosis Wound #1: Same as Pre-Procedure Plan Wound Cleansing: Wound #1 Left,Medial Lower Leg: Cleanse wound with mild soap and water Anesthetic (add to Medication List): Wound #1 Left,Medial Lower Leg: Topical Lidocaine 4% cream applied to wound bed prior to debridement (In Clinic Only). Primary Wound Dressing: Hydrafera Blue Ready Transfer Wound #1 Left,Medial Lower Leg: Mepitel One Contact layer - under hydrafera blue Secondary Dressing: Wound #1 Left,Medial Lower Leg: Boardered Foam Dressing Dressing Change Frequency: Wound #1 Left,Medial Lower Leg: Change dressing every other day. Follow-up Appointments: Wound #1 Left,Medial Lower Leg: Return  Appointment in 2 weeks. Edema Control: Wound #1 Left,Medial Lower Leg: RALONDA, TARTT ([id number]) Patient to wear own compression stockings Elevate legs to the level of the heart and pump ankles as often as possible Home Health: Wound #1 Left,Medial Lower Leg: Continue Home Health Visits Home Health Nurse may visit PRN to address patient s wound care needs. FACE TO FACE ENCOUNTER: MEDICARE and MEDICAID PATIENTS: I certify that this patient is under my care and that I had a face-to-face encounter that meets the physician face-to-face encounter requirements with this patient on this date. The encounter with the patient was in whole or in part for the following MEDICAL CONDITION: (primary reason for Home Healthcare) MEDICAL NECESSITY: I certify, that based on my findings, NURSING services are a medically necessary home health service. HOME BOUND STATUS: I certify that my clinical findings support that this patient is homebound (i.e., Due to illness or injury, pt requires aid of supportive devices such as crutches, cane, wheelchairs, walkers, the use of special transportation or the assistance of another person to leave their place of residence. There is a normal inability to leave the home and doing so requires considerable and taxing effort. Other absences are for medical reasons / religious services and are infrequent or of short duration when for other reasons). If current dressing causes regression in wound condition, may D/C ordered dressing product/s and apply Normal Saline Moist Dressing daily until next Wound Healing Center / Other MD appointment. Notify Wound Healing Center of regression in wound condition at [phone]. Please direct any NON-WOUND related issues/requests for orders to patient's Primary Care Physician #1 continue with Hydrofera Bluewith the Mepitel contact layer, border foam dressing #2 follow-up 2 weeks Electronic Signature(s) Signed: 12/28/2017 6:13:14 PM By:  Baltazar Najjar MD Entered By: Baltazar Najjar on 12/28/2017 12:22:23 Zoe Robinson ([id number]) -------------------------------------------------------------------------------- SuperBill Details Patient Name: Zoe Robinson Date of Service: 12/28/2017 Medical Record Number: [id number] Patient Account Number: [id number] Date of Birth/Sex: 10/29/1931 (85 y.o. 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

## 2018-01-04 ENCOUNTER — Ambulatory Visit: Payer: Medicare Other | Admitting: Rheumatology

## 2018-01-05 ENCOUNTER — Other Ambulatory Visit: Payer: Self-pay | Admitting: Rheumatology

## 2018-01-05 NOTE — Telephone Encounter (Addendum)
Last Visit: 10/04/17 Next Visit: 02/01/18 Labs: 08/01/17 Stable  Patient's daughter advised she is due to update labs. Patient's daughter will have her update labs.   Okay to refill 30 day supply per Dr. Corliss Skains

## 2018-01-11 ENCOUNTER — Other Ambulatory Visit: Payer: Self-pay

## 2018-01-11 ENCOUNTER — Encounter: Payer: Medicare Other | Admitting: Internal Medicine

## 2018-01-11 DIAGNOSIS — Z79899 Other long term (current) drug therapy: Secondary | ICD-10-CM

## 2018-01-11 DIAGNOSIS — E11622 Type 2 diabetes mellitus with other skin ulcer: Secondary | ICD-10-CM | POA: Diagnosis not present

## 2018-01-12 LAB — COMPLETE METABOLIC PANEL WITH GFR
AG Ratio: 1.2 (calc) (ref 1.0–2.5)
ALT: 11 U/L (ref 6–29)
AST: 18 U/L (ref 10–35)
Albumin: 3.7 g/dL (ref 3.6–5.1)
Alkaline phosphatase (APISO): 42 U/L (ref 33–130)
BUN/Creatinine Ratio: 20 (calc) (ref 6–22)
BUN: 19 mg/dL (ref 7–25)
CALCIUM: 9.5 mg/dL (ref 8.6–10.4)
CO2: 31 mmol/L (ref 20–32)
Chloride: 98 mmol/L (ref 98–110)
Creat: 0.95 mg/dL — ABNORMAL HIGH (ref 0.60–0.88)
GFR, EST NON AFRICAN AMERICAN: 55 mL/min/{1.73_m2} — AB (ref >=60)
GFR, Est African American: 64 mL/min/{1.73_m2} (ref >=60)
GLOBULIN: 3.1 g/dL (ref 1.9–3.7)
GLUCOSE: 168 mg/dL — AB (ref 65–99)
Potassium: 4.9 mmol/L (ref 3.5–5.3)
Sodium: 134 mmol/L — ABNORMAL LOW (ref 135–146)
Total Bilirubin: 0.3 mg/dL (ref 0.2–1.2)
Total Protein: 6.8 g/dL (ref 6.1–8.1)

## 2018-01-12 LAB — CBC WITH DIFFERENTIAL/PLATELET
BASOS ABS: 16 {cells}/uL (ref 0–200)
Basophils Relative: 0.2 %
EOS PCT: 0.1 %
Eosinophils Absolute: 8 cells/uL — ABNORMAL LOW (ref 15–500)
HEMATOCRIT: 30.7 % — AB (ref 35.0–45.0)
HEMOGLOBIN: 10.3 g/dL — AB (ref 11.7–15.5)
LYMPHS ABS: 976 {cells}/uL (ref 850–3900)
MCH: 27.6 pg (ref 27.0–33.0)
MCHC: 33.6 g/dL (ref 32.0–36.0)
MCV: 82.3 fL (ref 80.0–100.0)
MPV: 9.1 fL (ref 7.5–12.5)
Monocytes Relative: 7.3 %
NEUTROS ABS: 6601 {cells}/uL (ref 1500–7800)
Neutrophils Relative %: 80.5 %
Platelets: 362 10*3/uL (ref 140–400)
RBC: 3.73 10*6/uL — AB (ref 3.80–5.10)
RDW: 12.7 % (ref 11.0–15.0)
Total Lymphocyte: 11.9 %
WBC mixed population: 599 cells/uL (ref 200–950)
WBC: 8.2 10*3/uL (ref 3.8–10.8)

## 2018-01-12 NOTE — Progress Notes (Signed)
Is mildly elevated.  Patient is on methotrexate.  We will continue to monitor.

## 2018-01-13 NOTE — Progress Notes (Signed)
Zoe Robinson ([id number]) Visit Report for 01/11/2018 Arrival Information Details Patient Name: Zoe Robinson Date of Service: 01/11/2018 1:30 PM Medical Record Number: [id number] Patient Account Number: [id number] Date of Birth/Sex: 11-Feb-1932 (85 y.o. F) Treating RN: Zoe Robinson Primary Care Zara Wendt: Zoe Robinson Other Clinician: Referring Zoe Robinson: Zoe Robinson Treating Zoe Robinson/Extender: Zoe Robinson in Treatment: 18 Visit Information History Since Last Visit Added or deleted any medications: No Patient Arrived: Ambulatory Any new allergies or adverse reactions: No Arrival Time: 13:27 Had a fall or experienced change in No Accompanied By: daughter activities of daily living that may affect Transfer Assistance: None risk of falls: Patient Identification Verified: Yes Signs or symptoms of abuse/neglect since last visito No Secondary Verification Process Yes Hospitalized since last visit: No Completed: Implantable device outside of the clinic excluding No Patient Requires Transmission-Based No cellular tissue based products placed in the center Precautions: since last visit: Patient Has Alerts: Yes Has Dressing in Place as Prescribed: Yes Patient Alerts: Patient on Blood Pain Present Now: No Thinner xarelto Electronic Signature(s) Signed: 01/11/2018 3:47:23 PM By: Zoe Robinson Entered By: Zoe Robinson on 01/11/2018 13:28:01 Zoe Robinson ([id number]) -------------------------------------------------------------------------------- Clinic Level of Care Assessment Details Patient Name: Zoe Robinson Date of Service: 01/11/2018 1:30 PM Medical Record Number: [id number] Patient Account Number: [id number] Date of Birth/Sex: 1932-06-08 (85 y.o. F) Treating RN: Zoe Robinson Primary Care Izen Petz: Zoe Robinson Other Clinician: Referring Leanard Dimaio: Zoe Robinson Treating Zoe Robinson/Extender: Zoe Robinson in Treatment: 18 Clinic  Level of Care Assessment Items TOOL 4 Quantity Score []  - Use when only an EandM is performed on FOLLOW-UP visit 0 ASSESSMENTS - Nursing Assessment / Reassessment []  - Reassessment of Co-morbidities (includes updates in patient status) 0 X- 1 5 Reassessment of Adherence to Treatment Plan ASSESSMENTS - Wound and Skin Assessment / Reassessment X - Simple Wound Assessment / Reassessment - one wound 1 5 []  - 0 Complex Wound Assessment / Reassessment - multiple wounds []  - 0 Dermatologic / Skin Assessment (not related to wound area) ASSESSMENTS - Focused Assessment []  - Circumferential Edema Measurements - multi extremities 0 []  - 0 Nutritional Assessment / Counseling / Intervention []  - 0 Lower Extremity Assessment (monofilament, tuning fork, pulses) []  - 0 Peripheral Arterial Disease Assessment (using hand held doppler) ASSESSMENTS - Ostomy and/or Continence Assessment and Care []  - Incontinence Assessment and Management 0 []  - 0 Ostomy Care Assessment and Management (repouching, etc.) PROCESS - Coordination of Care X - Simple Patient / Family Education for ongoing care 1 15 []  - 0 Complex (extensive) Patient / Family Education for ongoing care []  - 0 Staff obtains , Records, Test Results / Process Orders []  - 0 Staff telephones HHA, Nursing Homes / Clarify orders / etc []  - 0 Routine Transfer to another Facility (non-emergent condition) []  - 0 Routine Hospital Admission (non-emergent condition) []  - 0 New Admissions / / Ordering NPWT, Apligraf, etc. []  - 0 Emergency Hospital Admission (emergent condition) X- 1 10 Simple Discharge Coordination Zoe Robinson ( ) []  - 0 Complex (extensive) Discharge Coordination PROCESS - Special Needs []  - Pediatric / Minor Patient Management 0 []  - 0 Isolation Patient Management []  - 0 Hearing / Language / Visual special needs []  - 0 Assessment of Community assistance (transportation, D/C  planning, etc.) []  - 0 Additional assistance / Altered mentation []  - 0 Support Surface(s) Assessment (bed, cushion, seat, etc.) INTERVENTIONS - Wound Cleansing / Measurement X - Simple Wound Cleansing - one wound 1 5 []  - 0 Complex  Wound Cleansing - multiple wounds X- 1 5 Wound Imaging (photographs - any number of wounds) []  - 0 Wound Tracing (instead of photographs) X- 1 5 Simple Wound Measurement - one wound []  - 0 Complex Wound Measurement - multiple wounds INTERVENTIONS - Wound Dressings X - Small Wound Dressing one or multiple wounds 1 10 []  - 0 Medium Wound Dressing one or multiple wounds []  - 0 Large Wound Dressing one or multiple wounds []  - 0 Application of Medications - topical []  - 0 Application of Medications - injection INTERVENTIONS - Miscellaneous []  - External ear exam 0 []  - 0 Specimen Collection (cultures, biopsies, blood, body fluids, etc.) []  - 0 Specimen(s) / Culture(s) sent or taken to Lab for analysis []  - 0 Patient Transfer (multiple staff / / Similar devices) []  - 0 Simple Staple / Suture removal (25 or less) []  - 0 Complex Staple / Suture removal (26 or more) []  - 0 Hypo / Hyperglycemic Management (close monitor of Blood Glucose) []  - 0 Ankle / Brachial Index (ABI) - do not check if billed separately X- 1 5 Vital Signs Zoe Robinson ( ) Has the patient been seen at the hospital within the last three years: Yes Total Score: 65 Level Of Care: New/Established - Level 2 Electronic Signature(s) Signed: 01/11/2018 4:19:21 PM By: , BSN, RN, CWS, Zoe Robinson Entered By: , BSN, RN, CWS, Zoe on 01/11/2018 13:50:22 ( ) -------------------------------------------------------------------------------- Encounter Discharge Information Details Patient Name: Date of Service: 01/11/2018 1:30 PM Medical Record Number: Nurse, adult Patient Account Number: Date of  Birth/Sex: 08/05/1931 (85 y.o. F) Treating RN: Primary Care Zoe Robinson: [id number] Other Clinician: Referring Hendrixx Severin: 01/13/2018 Treating Addisson Frate/Extender: Zoe Robinson in Treatment: 18 Encounter Discharge Information Items Discharge Condition: Stable Ambulatory Status: Ambulatory Discharge Destination: Home Transportation: Private Auto Accompanied By: daughter Schedule Follow-up Appointment: Yes Clinical Summary of Care: Electronic Signature(s) Signed: 01/11/2018 2:17:11 PM By: Zoe Robinson, BSN, RN, CWS, Zoe Robinson Entered By: [id number], BSN, RN, CWS, Zoe on 01/11/2018 14:17:11 01/13/2018 ([id number]) -------------------------------------------------------------------------------- Lower Extremity Assessment Details Patient Name: [id number] Date of Service: 01/11/2018 1:30 PM Medical Record Number: [id number] Patient Account Number: Zoe Robinson Date of Birth/Sex: 1932-07-01 (85 y.o. F) Treating RN: Zoe Mansfield Primary Care Iktan Aikman: 19 Other Clinician: Referring Bayden Gil: 01/13/2018 Treating Kelvin Burpee/Extender: Zoe Robinson in Treatment: 18 Vascular Assessment Pulses: Dorsalis Pedis Palpable: [Left:Yes] Posterior Tibial Extremity colors, hair growth, and conditions: Extremity Color: [Left:Normal] Hair Growth on Extremity: [Left:Yes] Temperature of Extremity: [Left:Warm] Capillary Refill: [Left:< 3 seconds] Electronic Signature(s) Signed: 01/11/2018 3:47:23 PM By: Zoe Robinson Entered By: [id number] on 01/11/2018 13:33:29 01/13/2018 ([id number]) -------------------------------------------------------------------------------- Multi Wound Chart Details Patient Name: [id number] Date of Service: 01/11/2018 1:30 PM Medical Record Number: [id number] Patient Account Number: Zoe Robinson Date of Birth/Sex: 10-19-1931 (85 y.o. F) Treating RN: Zoe  Primary Care Kleigh Hoelzer: 19 Other  Clinician: Referring Irianna Gilday: 01/13/2018 Treating Krissa Utke/Extender: Zoe Robinson in Treatment: 18 Vital Signs Height(in): 59 Pulse(bpm): 75 Weight(lbs): 117 Blood Pressure(mmHg): 143/53 Body Mass Index(BMI): 24 Temperature(F): 97.7 Respiratory Rate 16 (breaths/min): Photos: [N/A:N/A] Wound Location: Left, Medial Lower Leg N/A N/A Wounding Event: Trauma N/A N/A Primary Etiology: Venous Leg Ulcer N/A N/A Comorbid History: Cataracts, Anemia, N/A N/A Arrhythmia, Hypertension, Rheumatoid Arthritis, Osteoarthritis Date Acquired: 05/02/2017 N/A N/A Weeks of Treatment: 18 N/A N/A Wound Status: Healed - Epithelialized N/A N/A Measurements L x W x D 0x0x0 N/A N/A (cm) Area (cm) :  0 N/A N/A Volume (cm) : 0 N/A N/A % Reduction in Area: 100.00% N/A N/A % Reduction in Volume: 100.00% N/A N/A Classification: Full Thickness Without N/A N/A Exposed Support Structures Exudate Amount: None Present N/A N/A Wound Margin: Flat and Intact N/A N/A Granulation Amount: Large (67-100%) N/A N/A Granulation Quality: Pink N/A N/A Necrotic Amount: None Present (0%) N/A N/A Exposed Structures: Fat Layer (Subcutaneous N/A N/A Tissue) Exposed: Yes Fascia: No Tendon: No Muscle: No Zoe Robinson, Zoe Robinson ([id number]) Joint: No Bone: No Epithelialization: Large (67-100%) N/A N/A Periwound Skin Texture: Excoriation: No N/A N/A Induration: No Callus: No Crepitus: No Rash: No Scarring: No Periwound Skin Moisture: Maceration: No N/A N/A Dry/Scaly: No Periwound Skin Color: Atrophie Blanche: No N/A N/A Cyanosis: No Ecchymosis: No Erythema: No Hemosiderin Staining: No Mottled: No Pallor: No Rubor: No Temperature: No Abnormality N/A N/A Tenderness on Palpation: No N/A N/A Wound Preparation: Ulcer Cleansing: N/A N/A Rinsed/Irrigated with Saline Topical Anesthetic Applied: None Treatment Notes Electronic Signature(s) Signed: 01/11/2018 4:07:49 PM By: Baltazar Najjar  MD Entered By: Baltazar Najjar on 01/11/2018 14:43:44 Zoe Robinson ([id number]) -------------------------------------------------------------------------------- Multi-Disciplinary Care Plan Details Patient Name: Zoe Robinson Date of Service: 01/11/2018 1:30 PM Medical Record Number: [id number] Patient Account Number: [id number] Date of Birth/Sex: Aug 05, 1931 (85 y.o. F) Treating RN: Zoe Robinson Primary Care Makalya Nave: Zoe Robinson Other Clinician: Referring Marixa Mellott: Zoe Robinson Treating Boyce Keltner/Extender: Zoe Hillman in Treatment: 18 Active Inactive ` Abuse / Safety / Falls / Self Care Management Nursing Diagnoses: Potential for falls Goals: Patient will not experience any injury related to falls Date Initiated: 09/07/2017 Target Resolution Date: 12/03/2017 Goal Status: Active Interventions: Assess fall risk on admission and as needed Notes: ` Orientation to the Wound Care Program Nursing Diagnoses: Knowledge deficit related to the wound healing center program Goals: Patient/caregiver will verbalize understanding of the Wound Healing Center Program Date Initiated: 09/07/2017 Target Resolution Date: 12/03/2017 Goal Status: Active Interventions: Provide education on orientation to the wound center Notes: ` Wound/Skin Impairment Nursing Diagnoses: Impaired tissue integrity Goals: Ulcer/skin breakdown will heal within 14 weeks Date Initiated: 09/07/2017 Target Resolution Date: 12/03/2017 Goal Status: Active Interventions: Zoe Robinson, Zoe Robinson ([id number]) Assess patient/caregiver ability to obtain necessary supplies Assess patient/caregiver ability to perform ulcer/skin care regimen upon admission and as needed Assess ulceration(s) every visit Notes: Electronic Signature(s) Signed: 01/11/2018 4:19:21 PM By: Zoe Robinson, BSN, RN, CWS, Zoe Robinson Entered By: Zoe Robinson, BSN, RN, CWS, Zoe on 01/11/2018 13:48:13 Zoe Robinson  ([id number]) -------------------------------------------------------------------------------- Pain Assessment Details Patient Name: Zoe Robinson Date of Service: 01/11/2018 1:30 PM Medical Record Number: [id number] Patient Account Number: [id number] Date of Birth/Sex: 03-Oct-1931 (85 y.o. F) Treating RN: Zoe Robinson Primary Care Kenney Going: Zoe Robinson Other Clinician: Referring Duvall Comes: Zoe Robinson Treating Lailah Marcelli/Extender: Zoe  in Treatment: 18 Active Problems Location of Pain Severity and Description of Pain Patient Has Paino No Site Locations Pain Management and Medication Current Pain Management: Electronic Signature(s) Signed: 01/11/2018 3:47:23 PM By: Zoe Robinson Entered By: Zoe Robinson on 01/11/2018 13:28:07 Zoe Robinson ([id number]) -------------------------------------------------------------------------------- Patient/Caregiver Education Details Patient Name: Zoe Robinson Date of Service: 01/11/2018 1:30 PM Medical Record Number: [id number] Patient Account Number: [id number] Date of Birth/Gender: 1932-03-31 (85 y.o. F) Treating RN: Zoe Robinson Primary Care Physician: Zoe Robinson Other Clinician: Referring Physician: Gabriel Robinson Treating Physician/Extender: Zoe  in Treatment: 22 Education Assessment Education Provided To: Patient Education Topics Provided Wound/Skin Impairment: Handouts: Caring for Your Ulcer, Other: wear compression. moisturize legs Methods: Explain/Verbal Responses: State content correctly Electronic Signature(s) Signed: 01/11/2018 4:19:21  PM By: Zoe Robinson, BSN, RN, CWS, Zoe Robinson Entered By: Zoe Robinson, BSN, RN, CWS, Zoe on 01/11/2018 14:17:52 Zoe Robinson ([id number]) -------------------------------------------------------------------------------- Wound Assessment Details Patient Name: Zoe Robinson Date of Service: 01/11/2018 1:30 PM Medical Record Number:  [id number] Patient Account Number: [id number] Date of Birth/Sex: [birth year] (85 y.o. F) Treating RN: Zoe Robinson Primary Care Bryleigh Ottaway: Zoe Robinson Other Clinician: Referring Shuaib Corsino: Zoe Robinson Treating Justen Fonda/Extender: Zoe [place] in Treatment: 18 Wound Status Wound Number: 1 Primary Venous Leg Ulcer Etiology: Wound Location: Left, Medial Lower Leg Wound Healed - Epithelialized Wounding Event: Trauma Status: Date Acquired: 05/02/2017 Comorbid Cataracts, Anemia, Arrhythmia, Hypertension, Weeks Of Treatment: 18 History: Rheumatoid Arthritis, Osteoarthritis Clustered Wound: No Photos Photo Uploaded By: Zoe Robinson on 01/11/2018 14:39:26 Wound Measurements Length: (cm) 0 Width: (cm) 0 Depth: (cm) 0 Area: (cm) 0 Volume: (cm) 0 % Reduction in Area: 100% % Reduction in Volume: 100% Epithelialization: Large (67-100%) Tunneling: No Undermining: No Wound Description Full Thickness Without Exposed Support Foul O Classification: Structures Slough Wound Margin: Flat and Intact Exudate None Present Amount: dor After Cleansing: No /Fibrino Yes Wound Bed Granulation Amount: Large (67-100%) Exposed Structure Granulation Quality: Pink Fascia Exposed: No Necrotic Amount: None Present (0%) Fat Layer (Subcutaneous Tissue) Exposed: Yes Tendon Exposed: No Muscle Exposed: No Joint Exposed: No Bone Exposed: No Periwound Skin Texture Texture Color Layton, Aivah ([id number]) No Abnormalities Noted: No No Abnormalities Noted: No Callus: No Atrophie Blanche: No Crepitus: No Cyanosis: No Excoriation: No Ecchymosis: No Induration: No Erythema: No Rash: No Hemosiderin Staining: No Scarring: No Mottled: No Pallor: No Moisture Rubor: No No Abnormalities Noted: No Dry / Scaly: No Temperature / Pain Maceration: No Temperature: No Abnormality Wound Preparation Ulcer Cleansing: Rinsed/Irrigated with Saline Topical Anesthetic Applied:  None Electronic Signature(s) Signed: 01/11/2018 4:19:21 PM By: Zoe Robinson, BSN, RN, CWS, Zoe Robinson Entered By: Zoe Robinson, BSN, RN, CWS, Zoe on 01/11/2018 13:47:33 Zoe Robinson ([id number]) -------------------------------------------------------------------------------- Vitals Details Patient Name: Zoe Robinson Date of Service: 01/11/2018 1:30 PM Medical Record Number: [id number] Patient Account Number: [id number] Date of Birth/Sex: 05-Jan-1932 (85 y.o. F) Treating RN: Zoe Robinson Primary Care Jolan Mealor: Zoe Robinson Other Clinician: Referring Kory Rains: Zoe Robinson Treating Richa Shor/Extender: Zoe Hollister in Treatment: 18 Vital Signs Time Taken: 13:30 Temperature (F): 97.7 Height (in): 59 Pulse (bpm): 75 Weight (lbs): 117 Respiratory Rate (breaths/min): 16 Body Mass Index (BMI): 23.6 Blood Pressure (mmHg): 143/53 Reference Range: 80 - 120 mg / dl Electronic Signature(s) Signed: 01/11/2018 3:47:23 PM By: Zoe Robinson Entered By: Zoe Robinson on 01/11/2018 13:31:11

## 2018-01-13 NOTE — Progress Notes (Signed)
Zoe Robinson, Zoe Robinson ([id number]) Visit Report for 01/11/2018 HPI Details Patient Name: Zoe Robinson, Zoe Robinson Date of Service: 01/11/2018 1:30 PM Medical Record Number: [id number] Patient Account Number: [id number] Date of Birth/Sex: 12-04-1931 (84 y.o. F) Treating RN: Primary Care Provider: Gabriel Cirri Other Clinician: Referring Provider: Gabriel Cirri Treating Provider/Extender: Altamese [place] in Treatment: 18 History of Present Illness HPI Description: 09/07/17; this is an 84 year old woman who arrives accompanied by her daughter. She has a history of severe rheumatoid arthritis followed by Dr. Corliss Skains in [place]. By review of care everywhere it appears that she had cellulitis of her left lower leg in September 2018. This may have been caused by a dog scratch. She was followed and her primary care office at [facility] affiliated group. She received antibiotics, Silvadene cream as well as Una boots. This was followed through late October and the wound was healed out on 10/31. The patient and her daughters state that the current wound started with a scissor injury to the medial left calf before the wound was actually healed out in other words this is a different wound area. The original wound was more medial on the left anterior tibial area. Since then they've been using Silvadene cream and apparently she changed doctors who recommended Polysporin and they've been referred here for the injury on her left medial calf. She does not have a history of rheumatoid-related skin issues/vasculitis The patient is a type II diabetic with most recent hemoglobin A1c of 6. She has advanced seropositive rheumatoid arthritis on prednisone 10 mg alternating with 5 and methotrexate. I see she is also on xarelto while Im not really sure why. She has significant osteoporosis, history of hypercalcemia. ABI in this clinic is 1.08. She was a former smoker 09/14/17; patient's wound slightly smaller today however  still covered in a very thick adherent necrotic surface. We have been using Santyl under compression. We finally got home health to start yesterday "advanced" 09/21/17; not too much change in the area here. We have been using Santyl under compression and I'll continue this after debridement today. Consider Iodoflex if we don't get a better looking wound surface 09/22/17-she presents today as an urgent follow-up secondary to uncontrolled bleeding overnight. She has saturated through the 3-layer compression. Upon dressing removal there is significant clot formation, she continues to bleed from the most proximal aspect of the ulcer. 4 silver nitrate sticks were used to try to obtain hemostasis, along with 10 minutes of pressure dressing. The wound continues to bleed although not as briskly; this appears to be more of a venous bleed and arterial. She is hemodynamically stable upon presentation to the clinic SBP 130's, HR 80's with no complaints of dizziness, lightheadedness. We are unable to obtain any hemostatic agents from the emergency department. Since she has bled through the pressure dressing and continues to bleed despite all efforts available in the clinic she has been sent to the emergency department for evaluation and treatment 09/28/17; the patient has not had an easy time since the last time I saw her. She continues to have nonviable surface over the wound perhaps slightly better using Santyl. She has on xarelto which no doubt contributes to the bleeding after debridement. I'm going to change her to Iodoflex today. She is having a lot of discomfort with this we gave her tramadol which is reasonable 10/05/17;patient continues to have nonviable surface over a very difficult wound.we also note that her propensity to bleed. We have been using Iodoflex. previously minimal response previously to [employer].  She tells me that her rheumatologist is made adjustments to both her prednisone and methotrexate in  attempt to help heal this difficult area. One would wonder if this is all venous insufficiency and whether the rheumatoid arthritis itself could be contributing to an inflammatory ulcer/rheumatoid vasculitis 10/12/17 She is here in follow up evaluation for a left lower extremity ulcer. There is no improvement. We will transition to santyl. 10/19/17; she has transitioned back to santyl and has been changing this every day. There has been some improvement although still requiring mechanical debridement 10/25/17; still using Santyl on the area on the left lower extremity. There is improvement for the first time. Recently no debridement was felt to be necessary.she did not respond well to Iodoflex. Request for fThera skin was denied by her insurance [employer] ([id number]) 11/02/17; still using Santyl. She did not respond well to Iodoflex. We are getting a much better surface that this deep wound. Still requiring debridement.there is no advanced treatment option through her primary insurance [[employer]] 11/09/17; we are still using Santyl. Better looking surface still requiring mechanical debridement. There is on the left medial lower extremity.this initially started at the time of cellulitis in the left leg which may have been caused by a dog scratch. Had a lot of trouble getting in adequate surface on this wound. We did use Iodoflex for a period of time. we have not been able to get a proper surface to consider an alternative dressing 11/16/17; we finally have this wound down to a healthy granulated surface. We can graduate from [facility] to silver collagen. I have again reviewed the history of this apparently the patient had a scratch injury in the lateral aspect of her leg. In the course of wrapping this this was apparently a scissor injury at her primary's office. I'm he got this wrong and the original history 11/23/17; switch to Santyl collagen last week. Arrives today with some slough over the surface  over this represents an easy debridement. No change in dimensions. This was a scissor injury initially. 11/29/17; using collagen since last week. Wound surface is vibrant today. No change in dimensions however 12/07/17; wound is measuring smaller. Some surface left easily removed with gauze. Some hyper granulation generally the surface looks better.we have been using silver collagen I changed this to [facility] 12/14/17; wound is measuring smaller. Surface looks healthier. We've been using Hydrofera Blue under compression. 655/19; wound measures smaller however hyper granulation in the top 50% of the wound surface and some surrounding callus and skin that needed removing with a #3 curet. Otherwise this looks fairly good 01/11/18; the wound is much smaller. Fully epithelialized except for a small area in the middle that almost looks like a blister. Nevertheless this is epithelialized. I don't think is any need to do a specific topical dressing I would simply keep this area protected under her pressure stocking Electronic Signature(s) Signed: 01/11/2018 4:07:49 PM By: Baltazar Najjar MD Entered By: Baltazar Najjar on 01/11/2018 14:45:03 Zoe Robinson ([id number]) -------------------------------------------------------------------------------- Physical Exam Details Patient Name: Zoe Robinson Date of Service: 01/11/2018 1:30 PM Medical Record Number: [id number] Patient Account Number: [id number] Date of Birth/Sex: [birth year] (84 y.o. F) Treating RN: Primary Care Provider: Gabriel Cirri Other Clinician: Referring Provider: Gabriel Cirri Treating Provider/Extender: Altamese [place] in Treatment: 18 Constitutional Sitting or standing Blood Pressure is within target range for patient.. Pulse regular and within target range for patient.Marland Kitchen Respirations regular, non-labored and within target range.. Temperature is normal and within the target range  for the patient.Marland Kitchen appears in no  distress. Notes wound exam; the patient's wound has no open area although there is one threatened area in the middle aspect. Nevertheless all of this is epithelialized. No evidence of surrounding infection Electronic Signature(s) Signed: 01/11/2018 4:07:49 PM By: Baltazar Najjar MD Entered By: Baltazar Najjar on 01/11/2018 14:46:04 Zoe Robinson ([id number]) -------------------------------------------------------------------------------- Physician Orders Details Patient Name: Zoe Robinson Date of Service: 01/11/2018 1:30 PM Medical Record Number: [id number] Patient Account Number: [id number] Date of Birth/Sex: Apr 06, 1932 (84 y.o. F) Treating RN: Huel Coventry Primary Care Provider: Gabriel Cirri Other Clinician: Referring Provider: Gabriel Cirri Treating Provider/Extender: Altamese [place] in Treatment: 29 Verbal / Phone Orders: No Diagnosis Coding Primary Wound Dressing o Other: - Mepilex lite for protection Dressing Change Frequency o Other: - as needed Follow-up Appointments o Return Appointment in 2 weeks. Edema Control o Patient to wear own compression stockings Home Health o D/C Home [facility] Electronic Signature(s) Signed: 01/11/2018 4:07:49 PM By: Baltazar Najjar MD Signed: 01/11/2018 4:19:21 PM By: Elliot Gurney, BSN, RN, CWS, Kim RN, BSN Entered By: Elliot Gurney, BSN, RN, CWS, Kim on 01/11/2018 14:19:39 Zoe Robinson ([id number]) -------------------------------------------------------------------------------- Problem List Details Patient Name: Zoe Robinson Date of Service: 01/11/2018 1:30 PM Medical Record Number: [id number] Patient Account Number: [id number] Date of Birth/Sex: 1932-03-04 (84 y.o. 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 09/07/2017 No Yes I87.312 Chronic venous hypertension (idiopathic) with ulcer of left 09/07/2017 No Yes lower extremity M05.80 Other rheumatoid arthritis with rheumatoid factor of 09/07/2017 No Yes unspecified site Inactive Problems Resolved Problems Electronic Signature(s) Signed: 01/11/2018 4:07:49 PM By: Baltazar Najjar MD Entered By: Baltazar Najjar on 01/11/2018 14:43:31 Zoe Robinson ([id number]) -------------------------------------------------------------------------------- Progress Note Details Patient Name: Zoe Robinson Date of Service: 01/11/2018 1:30 PM Medical Record Number: [id number] Patient Account Number: [id number] Date of Birth/Sex: [birth year] (84 y.o. F) Treating RN: Primary Care Provider: Gabriel Cirri Other Clinician: Referring Provider: Gabriel Cirri Treating Provider/Extender: Altamese  in Treatment: 18 Subjective History of Present Illness (HPI) 09/07/17; this is an 84 year old woman who arrives accompanied by her daughter. She has a history of severe rheumatoid arthritis followed by Dr. Corliss Skains in [place]. By review of care everywhere it appears that she had cellulitis of her left lower leg in September 2018. This may have been caused by a dog scratch. She was followed and her primary care office at [facility] affiliated group. She received antibiotics, Silvadene cream as well as Una boots. This was followed through late October and the wound was healed out on 10/31. The patient and her daughters state that the current wound started with a scissor injury to the medial left calf before the wound was actually healed out in other words this is a different wound area. The original wound was more medial on the left anterior tibial area. Since then they've been using Silvadene cream and apparently she changed doctors who recommended Polysporin and  they've been referred here for the injury on her left medial calf. She does not have a history of rheumatoid-related skin issues/vasculitis The patient is a type II diabetic with most recent hemoglobin A1c of 6. She has advanced seropositive rheumatoid arthritis on prednisone 10 mg alternating with 5 and methotrexate. I see she is also on xarelto while Im not really sure  why. She has significant osteoporosis, history of hypercalcemia. ABI in this clinic is 1.08. She was a former smoker 09/14/17; patient's wound slightly smaller today however still covered in a very thick adherent necrotic surface. We have been using Santyl under compression. We finally got home health to start yesterday "advanced" 09/21/17; not too much change in the area here. We have been using Santyl under compression and I'll continue this after debridement today. Consider Iodoflex if we don't get a better looking wound surface 09/22/17-she presents today as an urgent follow-up secondary to uncontrolled bleeding overnight. She has saturated through the 3-layer compression. Upon dressing removal there is significant clot formation, she continues to bleed from the most proximal aspect of the ulcer. 4 silver nitrate sticks were used to try to obtain hemostasis, along with 10 minutes of pressure dressing. The wound continues to bleed although not as briskly; this appears to be more of a venous bleed and arterial. She is hemodynamically stable upon presentation to the clinic SBP 130's, HR 80's with no complaints of dizziness, lightheadedness. We are unable to obtain any hemostatic agents from the emergency department. Since she has bled through the pressure dressing and continues to bleed despite all efforts available in the clinic she has been sent to the emergency department for evaluation and treatment 09/28/17; the patient has not had an easy time since the last time I saw her. She continues to have nonviable surface over the wound  perhaps slightly better using Santyl. She has on xarelto which no doubt contributes to the bleeding after debridement. I'm going to change her to Iodoflex today. She is having a lot of discomfort with this we gave her tramadol which is reasonable 10/05/17;patient continues to have nonviable surface over a very difficult wound.we also note that her propensity to bleed. We have been using Iodoflex. previously minimal response previously to [employer]. She tells me that her rheumatologist is made adjustments to both her prednisone and methotrexate in attempt to help heal this difficult area. One would wonder if this is all venous insufficiency and whether the rheumatoid arthritis itself could be contributing to an inflammatory ulcer/rheumatoid vasculitis 10/12/17 She is here in follow up evaluation for a left lower extremity ulcer. There is no improvement. We will transition to santyl. 10/19/17; she has transitioned back to santyl and has been changing this every day. There has been some improvement although still requiring mechanical debridement 10/25/17; still using Santyl on the area on the left lower extremity. There is improvement for the first time. Recently no debridement was felt to be necessary.she did not respond well to Iodoflex. Request for fThera skin was denied by her insurance 11/02/17; still using Santyl. She did not respond well to Iodoflex. We are getting a much better surface that this deep wound. Still requiring debridement.there is no advanced treatment option through her primary insurance BERNAR[employer], ER[employer] ([id number]) 11/09/17; we are still using Santyl. Better looking surface still requiring mechanical debridement. There is on the left medial lower extremity.this initially started at the time of cellulitis in the left leg which may have been caused by a dog scratch. Had a lot of trouble getting in adequate surface on this wound. We did use Iodoflex for a period of time. we have  not been able to get a proper surface to consider an alternative dressing 11/16/17; we finally have this wound down to a healthy granulated surface. We can graduate from [facility] to silver collagen. I have again reviewed the history of this  apparently the patient had a scratch injury in the lateral aspect of her leg. In the course of wrapping this this was apparently a scissor injury at her primary's office. I'm he got this wrong and the original history 11/23/17; switch to Santyl collagen last week. Arrives today with some slough over the surface over this represents an easy debridement. No change in dimensions. This was a scissor injury initially. 11/29/17; using collagen since last week. Wound surface is vibrant today. No change in dimensions however 12/07/17; wound is measuring smaller. Some surface left easily removed with gauze. Some hyper granulation generally the surface looks better.we have been using silver collagen I changed this to [facility] 12/14/17; wound is measuring smaller. Surface looks healthier. We've been using Hydrofera Blue under compression. 655/19; wound measures smaller however hyper granulation in the top 50% of the wound surface and some surrounding callus and skin that needed removing with a #3 curet. Otherwise this looks fairly good 01/11/18; the wound is much smaller. Fully epithelialized except for a small area in the middle that almost looks like a blister. Nevertheless this is epithelialized. I don't think is any need to do a specific topical dressing I would simply keep this area protected under her pressure stocking Objective Constitutional Sitting or standing Blood Pressure is within target range for patient.. Pulse regular and within target range for patient.Marland Kitchen Respirations regular, non-labored and within target range.. Temperature is normal and within the target range for the patient.Marland Kitchen appears in no distress. Vitals Time Taken: 1:30 PM, Height: 59 in, Weight:  117 lbs, BMI: 23.6, Temperature: 97.7 F, Pulse: 75 bpm, Respiratory Rate: 16 breaths/min, Blood Pressure: 143/53 mmHg. General Notes: wound exam; the patient's wound has no open area although there is one threatened area in the middle aspect. Nevertheless all of this is epithelialized. No evidence of surrounding infection Integumentary (Hair, Skin) Wound #1 status is Healed - Epithelialized. Original cause of wound was Trauma. The wound is located on the Left,Medial Lower Leg. The wound measures 0cm length x 0cm width x 0cm depth; 0cm^2 area and 0cm^3 volume. There is Fat Layer (Subcutaneous Tissue) Exposed exposed. There is no tunneling or undermining noted. There is a none present amount of drainage noted. The wound margin is flat and intact. There is large (67-100%) pink granulation within the wound bed. There is no necrotic tissue within the wound bed. The periwound skin appearance did not exhibit: Callus, Crepitus, Excoriation, Induration, Rash, Scarring, Dry/Scaly, Maceration, Atrophie Blanche, Cyanosis, Ecchymosis, Hemosiderin Staining, Mottled, Pallor, Rubor, Erythema. Periwound temperature was noted as No Abnormality. Assessment AANSHI, BATCHELDER ([id number]) Active Problems ICD-10 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 muscle Chronic venous hypertension (idiopathic) with ulcer of left lower extremity Other rheumatoid arthritis with rheumatoid factor of unspecified site Plan Primary Wound Dressing: Other: - Mepilex lite for protection Dressing Change Frequency: Other: - as needed Follow-up Appointments: Return Appointment in 2 weeks. Edema Control: Patient to wear own compression stockings Home Health: D/C Home Health Services #1 keep this covered with a protective covering it under her own stocking. No specific primary dressing #2 I would like to review this again in a few weeks to make sure this maintains a healed state Electronic Signature(s) Signed:  01/11/2018 4:07:49 PM By: Baltazar Najjar MD Entered By: Baltazar Najjar on 01/11/2018 14:46:55 Zoe Robinson ([id number]) -------------------------------------------------------------------------------- SuperBill Details Patient Name: Zoe Robinson Date of Service: 01/11/2018 Medical Record Number: [id number] Patient Account Number: [id number] Date of Birth/Sex: 04-Feb-1932 (84 y.o. F) Treating RN:  Primary Care Provider: Gabriel Cirri Other Clinician: Referring Provider: Gabriel Cirri Treating Provider/Extender: Altamese [place] in Treatment: 18 Diagnosis Coding ICD-10 Codes Code Description [phone] 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 muscle I87.312 Chronic venous hypertension (idiopathic) with ulcer of left lower extremity M05.80 Other rheumatoid arthritis with rheumatoid factor of unspecified site Facility Procedures CPT4 Code: [id number] Description: [phone] - WOUND CARE VISIT-LEV 2 EST PT Modifier: Quantity: 1 Physician Procedures CPT4 Code: [id number] Description: 30865 - WC PHYS LEVEL 2 - EST PT ICD-10 Diagnosis Description L97.223 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 musc I87.312 Chronic venous hypertension (idiopathic) with ulcer of left l Modifier: le ower extremity Quantity: 1 Electronic Signature(s) Signed: 01/11/2018 4:07:49 PM By: Baltazar Najjar MD Entered By: Baltazar Najjar on 01/11/2018 15:32:14

## 2018-01-19 NOTE — Progress Notes (Deleted)
Office Visit Note  Patient: Zoe Robinson             Date of Birth: 1932/02/08           MRN: [id number]             PCP: Gabriel Cirri, NP Referring: Gabriel Cirri, NP Visit Date: 02/01/2018 Occupation: @GUAROCC @    Subjective:  No chief complaint on file.   History of Present Illness: Presly Steinruck is a 84 y.o. female ***   Activities of Daily Living:  Patient reports morning stiffness for *** {minute/hour:19697}.   Patient {ACTIONS;DENIES/REPORTS:21021675::"Denies"} nocturnal pain.  Difficulty dressing/grooming: {ACTIONS;DENIES/REPORTS:21021675::"Denies"} Difficulty climbing stairs: {ACTIONS;DENIES/REPORTS:21021675::"Denies"} Difficulty getting out of chair: {ACTIONS;DENIES/REPORTS:21021675::"Denies"} Difficulty using hands for taps, buttons, cutlery, and/or writing: {ACTIONS;DENIES/REPORTS:21021675::"Denies"}   No Rheumatology ROS completed.   PMFS History:  Patient Active Problem List   Diagnosis Date Noted  . High risk medication use 03/25/2017  . Rheumatoid arthritis involving multiple sites with positive rheumatoid factor (HCC) 03/15/2017  . Rheumatoid arthritis involving both knees with positive rheumatoid factor (HCC) 03/15/2017  . Rheumatoid arthritis involving both hands with positive rheumatoid factor (HCC) 03/15/2017  . Rheumatoid arthritis involving both feet with positive rheumatoid factor (HCC) 03/15/2017  . History of vertebral fracture 03/15/2017  . On prednisone therapy 02/10/2017  . Age-related osteoporosis without current pathological fracture 02/10/2017  . History of pulmonary hypertension 02/10/2017  . History of atrial fibrillation 02/10/2017  . History of anemia 02/10/2017  . History of recurrent UTIs 02/10/2017  . History of hypercalcemia 02/10/2017  . On anticoagulant therapy 02/10/2017  . Acute encephalopathy 08/19/2014  . Neutropenia (HCC) 04/20/2014  . Fever, unspecified 04/20/2014  . Unspecified constipation 04/20/2014  .  Hypercalcemia 04/15/2014  . Acute renal failure (HCC) 04/15/2014  . Generalized weakness 04/15/2014  . Dehydration 04/15/2014  . UTI (urinary tract infection) 04/15/2014  . Iron deficiency anemia 04/15/2014  . CKD (chronic kidney disease)   . Hypertension   . Atrial fibrillation [facility])     Past Medical History:  Diagnosis Date  . Arthritis   . Atrial fibrillation (HCC)   . CKD (chronic kidney disease)   . Hypertension   . RA (rheumatoid arthritis) (HCC)     Family History  Problem Relation Age of Onset  . Heart attack Mother   . Heart attack Father   . Heart attack Brother   . Heart attack Brother   . Stomach cancer Brother   . Cancer Sister   . Dementia Sister   . Osteoarthritis Daughter   . Depression Daughter   . Hypothyroidism Daughter   . Hypercholesterolemia Daughter   . Heart disease Son   . Hypertension Son   . Gout Son    Past Surgical History:  Procedure Laterality Date  . ABDOMINAL HYSTERECTOMY     Social History   Social History Narrative  . Not on file     Objective: Vital Signs: There were no vitals taken for this visit.   Physical Exam   Musculoskeletal Exam: ***  CDAI Exam: No CDAI exam completed.    Investigation: No additional findings. CBC Latest Ref Rng & Units 01/11/2018 08/01/2017 07/11/2017  WBC 3.8 - 10.8 Thousand/uL 8.2 8.2 8.9  Hemoglobin 11.7 - 15.5 g/dL 10.3(L) 10.9(L) 10.0(L)  Hematocrit 35.0 - 45.0 % 30.7(L) 31.9(L) 29.3(L)  Platelets 140 - 400 Thousand/uL 362 370 363   CMP Latest Ref Rng & Units 01/11/2018 08/01/2017 07/11/2017  Glucose 65 - 99 mg/dL 07/13/2017) 81 924(Q)  BUN 7 -  25 mg/dL 19 19 81(O)  Creatinine 0.60 - 0.88 mg/dL 1.75(Z) 0.25 8.52  Sodium 135 - 146 mmol/L 134(L) 132(L) 133(L)  Potassium 3.5 - 5.3 mmol/L 4.9 5.0 4.7  Chloride 98 - 110 mmol/L 98 96(L) 95(L)  CO2 20 - 32 mmol/L 31 29 29   Calcium 8.6 - 10.4 mg/dL 9.5 9.7 9.3  Total Protein 6.1 - 8.1 g/dL 6.8 7.3 6.8  Total Bilirubin 0.2 - 1.2 mg/dL 0.3 0.4 0.3   Alkaline Phos 39 - 117 U/L - - -  AST 10 - 35 U/L 18 18 18   ALT 6 - 29 U/L 11 11 11     Imaging: No results found.  Speciality Comments: No specialty comments available.    Procedures:  No procedures performed Allergies: Patient has no known allergies.   Assessment / Plan:     Visit Diagnoses: No diagnosis found.    Orders: No orders of the defined types were placed in this encounter.  No orders of the defined types were placed in this encounter.   Face-to-face time spent with patient was *** minutes. Greater than 50% of time was spent in counseling and coordination of care.  Follow-Up Instructions: No follow-ups on file.   , CMA  Note - This record has been created using .  Chart creation errors have been sought, but may not always  have been located. Such creation errors do not reflect on  the standard of medical care.

## 2018-01-29 ENCOUNTER — Other Ambulatory Visit: Payer: Self-pay | Admitting: Rheumatology

## 2018-01-30 NOTE — Telephone Encounter (Signed)
Last visit: 10/04/2017 Next visit: 02/01/2018 Labs: 01/11/2018 Is mildly elevated. Patient is on methotrexate. We will continue to monitor.  Okay to refill per Dr. Corliss Skains.

## 2018-02-01 ENCOUNTER — Encounter: Payer: Medicare Other | Attending: Internal Medicine | Admitting: Internal Medicine

## 2018-02-01 ENCOUNTER — Ambulatory Visit: Payer: Medicare Other | Admitting: Rheumatology

## 2018-02-01 DIAGNOSIS — I87312 Chronic venous hypertension (idiopathic) with ulcer of left lower extremity: Secondary | ICD-10-CM | POA: Insufficient documentation

## 2018-02-01 DIAGNOSIS — M059 Rheumatoid arthritis with rheumatoid factor, unspecified: Secondary | ICD-10-CM | POA: Diagnosis not present

## 2018-02-01 DIAGNOSIS — M810 Age-related osteoporosis without current pathological fracture: Secondary | ICD-10-CM | POA: Insufficient documentation

## 2018-02-01 DIAGNOSIS — Z87891 Personal history of nicotine dependence: Secondary | ICD-10-CM | POA: Insufficient documentation

## 2018-02-01 DIAGNOSIS — E1151 Type 2 diabetes mellitus with diabetic peripheral angiopathy without gangrene: Secondary | ICD-10-CM | POA: Diagnosis not present

## 2018-02-01 DIAGNOSIS — E11622 Type 2 diabetes mellitus with other skin ulcer: Secondary | ICD-10-CM | POA: Insufficient documentation

## 2018-02-01 DIAGNOSIS — L97223 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 muscle: Secondary | ICD-10-CM | POA: Insufficient documentation

## 2018-02-01 DIAGNOSIS — Z7901 Long term (current) use of anticoagulants: Secondary | ICD-10-CM | POA: Diagnosis not present

## 2018-02-02 NOTE — Progress Notes (Signed)
[name], [name] ([id number]) Visit Report for 02/01/2018 Arrival Information Details Patient Name: Zoe Robinson, Zoe Robinson Date of Service: 02/01/2018 1:15 PM Medical Record Number: [id number] Patient Account Number: [id number] Date of Birth/Sex: 15-Oct-1931 (85 y.o. F) Treating RN: Phillis Haggis Primary Care Zethan Alfieri: Gabriel Cirri Other Clinician: Referring Nakyra Bourn: Gabriel Cirri Treating Bekah Igoe/Extender: Altamese [place] in Treatment: 21 Visit Information History Since Last Visit All ordered tests and consults were completed: No Patient Arrived: Ambulatory Added or deleted any medications: No Arrival Time: 13:20 Any new allergies or adverse reactions: No Accompanied By: dtr Had a fall or experienced change in No Transfer Assistance: None activities of daily living that may affect Patient Identification Verified: Yes risk of falls: Secondary Verification Process Yes Signs or symptoms of abuse/neglect since last visito No Completed: Hospitalized since last visit: No Patient Requires Transmission-Based No Implantable device outside of the clinic excluding No Precautions: cellular tissue based products placed in the center Patient Has Alerts: Yes since last visit: Patient Alerts: Patient on Blood Has Dressing in Place as Prescribed: Yes Thinner Pain Present Now: No xarelto Electronic Signature(s) Signed: 02/01/2018 5:25:23 PM By: Alejandro Mulling Entered By: Alejandro Mulling on 02/01/2018 13:20:35 Zoe Robinson ([id number]) -------------------------------------------------------------------------------- Clinic Level of Care Assessment Details Patient Name: Zoe Robinson Date of Service: 02/01/2018 1:15 PM Medical Record Number: [id number] Patient Account Number: [id number] Date of Birth/Sex: [birth year] (85 y.o. F) Treating RN: Renne Crigler Primary Care Jasira Robinson: Gabriel Cirri Other Clinician: Referring Margarie Mcguirt: Gabriel Cirri Treating  Adilynne Fitzwater/Extender: Altamese  in Treatment: 21 Clinic Level of Care Assessment Items TOOL 4 Quantity Score X - Use when only an EandM is performed on FOLLOW-UP visit 1 0 ASSESSMENTS - Nursing Assessment / Reassessment X - Reassessment of Co-morbidities (includes updates in patient status) 1 10 X- 1 5 Reassessment of Adherence to Treatment Plan ASSESSMENTS - Wound and Skin Assessment / Reassessment X - Simple Wound Assessment / Reassessment - one wound 1 5 []  - 0 Complex Wound Assessment / Reassessment - multiple wounds []  - 0 Dermatologic / Skin Assessment (not related to wound area) ASSESSMENTS - Focused Assessment []  - Circumferential Edema Measurements - multi extremities 0 []  - 0 Nutritional Assessment / Counseling / Intervention []  - 0 Lower Extremity Assessment (monofilament, tuning fork, pulses) []  - 0 Peripheral Arterial Disease Assessment (using hand held doppler) ASSESSMENTS - Ostomy and/or Continence Assessment and Care []  - Incontinence Assessment and Management 0 []  - 0 Ostomy Care Assessment and Management (repouching, etc.) PROCESS - Coordination of Care X - Simple Patient / Family Education for ongoing care 1 15 []  - 0 Complex (extensive) Patient / Family Education for ongoing care []  - 0 Staff obtains , Records, Test Results / Process Orders []  - 0 Staff telephones HHA, Nursing Homes / Clarify orders / etc []  - 0 Routine Transfer to another Facility (non-emergent condition) []  - 0 Routine Hospital Admission (non-emergent condition) []  - 0 New Admissions / / Ordering NPWT, Apligraf, etc. []  - 0 Emergency Hospital Admission (emergent condition) X- 1 10 Simple Discharge Coordination JEWELINE, REIF ( ) []  - 0 Complex (extensive) Discharge Coordination PROCESS - Special Needs []  - Pediatric / Minor Patient Management 0 []  - 0 Isolation Patient Management []  - 0 Hearing / Language / Visual special  needs []  - 0 Assessment of Community assistance (transportation, D/C planning, etc.) []  - 0 Additional assistance / Altered mentation []  - 0 Support Surface(s) Assessment (bed, cushion, seat, etc.) INTERVENTIONS - Wound Cleansing / Measurement []  - Simple Wound  Cleansing - one wound 0 []  - 0 Complex Wound Cleansing - multiple wounds X- 1 5 Wound Imaging (photographs - any number of wounds) []  - 0 Wound Tracing (instead of photographs) []  - 0 Simple Wound Measurement - one wound []  - 0 Complex Wound Measurement - multiple wounds INTERVENTIONS - Wound Dressings X - Small Wound Dressing one or multiple wounds 1 10 []  - 0 Medium Wound Dressing one or multiple wounds []  - 0 Large Wound Dressing one or multiple wounds []  - 0 Application of Medications - topical []  - 0 Application of Medications - injection INTERVENTIONS - Miscellaneous []  - External ear exam 0 []  - 0 Specimen Collection (cultures, biopsies, blood, body fluids, etc.) []  - 0 Specimen(s) / Culture(s) sent or taken to Lab for analysis []  - 0 Patient Transfer (multiple staff / / Similar devices) []  - 0 Simple Staple / Suture removal (25 or less) []  - 0 Complex Staple / Suture removal (26 or more) []  - 0 Hypo / Hyperglycemic Management (close monitor of Blood Glucose) []  - 0 Ankle / Brachial Index (ABI) - do not check if billed separately X- 1 5 Vital Signs Zoe Robinson, Zoe Robinson ( ) Has the patient been seen at the hospital within the last three years: Yes Total Score: 65 Level Of Care: New/Established - Level 2 Electronic Signature(s) Signed: 02/01/2018 5:14:04 PM By: Entered By: on 02/01/2018 13:38:36 ( ) -------------------------------------------------------------------------------- Encounter Discharge Information Details Patient Name: Date of Service: 02/01/2018 1:15 PM Medical Record Number:  Patient Account Number: Date of Birth/Sex: 1932-04-12 (85 y.o. F) Treating RN: Primary Care Tyreese Thain: Other Clinician: Referring Wilver Tignor: Treating Ysidro Ramsay/Extender: [id number] in Treatment: 21 Encounter Discharge Information Items Discharge Condition: Stable Ambulatory Status: Ambulatory Discharge Destination: Home Transportation: Private Auto Accompanied By: daughter Schedule Follow-up Appointment: Yes Clinical Summary of Care: Notes discharged with compression socks bilateral Electronic Signature(s) Signed: 02/01/2018 5:14:04 PM By: Renne Crigler Entered By: Renne Crigler on 02/01/2018 13:39:25 Zoe Robinson ([id number]) -------------------------------------------------------------------------------- Lower Extremity Assessment Details Patient Name: Zoe Robinson Date of Service: 02/01/2018 1:15 PM Medical Record Number: [id number] Patient Account Number: [id number] Date of Birth/Sex: 1932-04-17 (85 y.o. F) Treating RN: Renne Crigler Primary Care Kariss Longmire: Gabriel Cirri Other Clinician: Referring Yameli Delamater: Gabriel Cirri Treating Pike Scantlebury/Extender: Altamese [place] in Treatment: 21 Vascular Assessment Pulses: Dorsalis Pedis Palpable: [Left:Yes] Posterior Tibial Extremity colors, hair growth, and conditions: Extremity Color: [Left:Normal] Temperature of Extremity: [Left:Warm] Capillary Refill: [Left:< 3 seconds] Toe Nail Assessment Left: Right: Thick: Yes Discolored: Yes Deformed: Yes Improper Length and Hygiene: Yes Electronic Signature(s) Signed: 02/01/2018 5:25:23 PM By: Renne Crigler Entered By: Renne Crigler on 02/01/2018 13:27:12 Zoe Robinson ([id number]) -------------------------------------------------------------------------------- Multi Wound Chart Details Patient Name: Zoe Robinson Date of Service: 02/01/2018 1:15 PM Medical Record  Number: [id number] Patient Account Number: [id number] Date of Birth/Sex: April 04, 1932 (85 y.o. F) Treating RN: Phillis Haggis Primary Care Allyson Tineo: Gabriel Cirri Other Clinician: Referring Herma Uballe: Gabriel Cirri Treating Aniyia Rane/Extender: Altamese [place] in Treatment: 21 Vital Signs Height(in): 59 Pulse(bpm): 76 Weight(lbs): 117 Blood Pressure(mmHg): 143/60 Body Mass Index(BMI): 24 Temperature(F): 98.2 Respiratory Rate 16 (breaths/min): Wound Assessments Treatment Notes Electronic Signature(s) Signed: 02/01/2018 6:13:38 PM By: Alejandro Mulling MD Entered By: Alejandro Mulling on 02/01/2018 15:06:34 Zoe Robinson ([id number]) -------------------------------------------------------------------------------- Multi-Disciplinary Care Plan Details Patient Name: Zoe Robinson Date of Service: 02/01/2018 1:15 PM Medical Record Number: [id number] Patient Account Number: [id number] Date of Birth/Sex: [birth year] (85 y.o.  F) Treating RN: Renne Crigler Primary Care Shakir Petrosino: Gabriel Cirri Other Clinician: Referring Kymia Simi: Gabriel Cirri Treating Derrill Bagnell/Extender: Maxwell Caul [place] in Treatment: 21 Active Inactive Electronic Signature(s) Signed: 02/01/2018 5:14:04 PM By: Renne Crigler Entered By: Renne Crigler on 02/01/2018 13:49:30 Zoe Robinson ([id number]) -------------------------------------------------------------------------------- Pain Assessment Details Patient Name: Zoe Robinson Date of Service: 02/01/2018 1:15 PM Medical Record Number: [id number] Patient Account Number: [id number] Date of Birth/Sex: May 10, 1932 (85 y.o. F) Treating RN: Phillis Haggis Primary Care Jamirra Curnow: Gabriel Cirri Other Clinician: Referring Chrisa Hassan: Gabriel Cirri Treating Shaquinta Peruski/Extender: Altamese [place] in Treatment: 21 Active Problems Location of Pain Severity and Description of Pain Patient Has Paino No Site Locations Pain Management  and Medication Current Pain Management: Electronic Signature(s) Signed: 02/01/2018 5:25:23 PM By: Alejandro Mulling Entered By: Alejandro Mulling on 02/01/2018 13:20:40 Zoe Robinson ([id number]) -------------------------------------------------------------------------------- Patient/Caregiver Education Details Patient Name: Zoe Robinson Date of Service: 02/01/2018 1:15 PM Medical Record Number: [id number] Patient Account Number: [id number] Date of Birth/Gender: 10-18-1931 (85 y.o. F) Treating RN: Renne Crigler Primary Care Physician: Gabriel Cirri Other Clinician: Referring Physician: Gabriel Cirri Treating Physician/Extender: Altamese [place] in Treatment: 21 Education Assessment Education Provided To: Patient and Caregiver Education Topics Provided Wound/Skin Impairment: Handouts: Skin Care Do's and Dont's Methods: Explain/Verbal Responses: State content correctly Electronic Signature(s) Signed: 02/01/2018 5:14:04 PM By: Renne Crigler Entered By: Renne Crigler on 02/01/2018 13:39:36 Zoe Robinson ([id number]) -------------------------------------------------------------------------------- Vitals Details Patient Name: Zoe Robinson Date of Service: 02/01/2018 1:15 PM Medical Record Number: [id number] Patient Account Number: [id number] Date of Birth/Sex: 1932-03-11 (85 y.o. F) Treating RN: Phillis Haggis Primary Care Quanta Robertshaw: Gabriel Cirri Other Clinician: Referring Corene Resnick: Gabriel Cirri Treating Huntleigh Doolen/Extender: Altamese [place] in Treatment: 21 Vital Signs Time Taken: 13:21 Temperature (F): 98.2 Height (in): 59 Pulse (bpm): 76 Weight (lbs): 117 Respiratory Rate (breaths/min): 16 Body Mass Index (BMI): 23.6 Blood Pressure (mmHg): 143/60 Reference Range: 80 - 120 mg / dl Electronic Signature(s) Signed: 02/01/2018 5:25:23 PM By: Alejandro Mulling Entered By: Alejandro Mulling on 02/01/2018 13:22:16

## 2018-02-02 NOTE — Progress Notes (Signed)
KOREENA, JOOST ([id number]) Visit Report for 02/01/2018 HPI Details Patient Name: Zoe Robinson, Zoe Robinson Date of Service: 02/01/2018 1:15 PM Medical Record Number: [id number] Patient Account Number: [id number] Date of Birth/Sex: January 01, 1932 (84 y.o. F) Treating RN: Renne Crigler Primary Care Provider: Gabriel Cirri Other Clinician: Referring Provider: Gabriel Cirri Treating Provider/Extender: Altamese [place] in Treatment: 21 History of Present Illness HPI Description: 09/07/17; this is an 84 year old woman who arrives accompanied by her daughter. She has a history of severe rheumatoid arthritis followed by Dr. Corliss Skains in [place]. By review of care everywhere it appears that she had cellulitis of her left lower leg in September 2018. This may have been caused by a dog scratch. She was followed and her primary care office at [facility] affiliated group. She received antibiotics, Silvadene cream as well as Una boots. This was followed through late October and the wound was healed out on 10/31. The patient and her daughters state that the current wound started with a scissor injury to the medial left calf before the wound was actually healed out in other words this is a different wound area. The original wound was more medial on the left anterior tibial area. Since then they've been using Silvadene cream and apparently she changed doctors who recommended Polysporin and they've been referred here for the injury on her left medial calf. She does not have a history of rheumatoid-related skin issues/vasculitis The patient is a type II diabetic with most recent hemoglobin A1c of 6. She has advanced seropositive rheumatoid arthritis on prednisone 10 mg alternating with 5 and methotrexate. I see she is also on xarelto while Im not really sure why. She has significant osteoporosis, history of hypercalcemia. ABI in this clinic is 1.08. She was a former smoker 09/14/17; patient's wound slightly  smaller today however still covered in a very thick adherent necrotic surface. We have been using Santyl under compression. We finally got home health to start yesterday "advanced" 09/21/17; not too much change in the area here. We have been using Santyl under compression and I'll continue this after debridement today. Consider Iodoflex if we don't get a better looking wound surface 09/22/17-she presents today as an urgent follow-up secondary to uncontrolled bleeding overnight. She has saturated through the 3-layer compression. Upon dressing removal there is significant clot formation, she continues to bleed from the most proximal aspect of the ulcer. 4 silver nitrate sticks were used to try to obtain hemostasis, along with 10 minutes of pressure dressing. The wound continues to bleed although not as briskly; this appears to be more of a venous bleed and arterial. She is hemodynamically stable upon presentation to the clinic SBP 130's, HR 80's with no complaints of dizziness, lightheadedness. We are unable to obtain any hemostatic agents from the emergency department. Since she has bled through the pressure dressing and continues to bleed despite all efforts available in the clinic she has been sent to the emergency department for evaluation and treatment 09/28/17; the patient has not had an easy time since the last time I saw her. She continues to have nonviable surface over the wound perhaps slightly better using Santyl. She has on xarelto which no doubt contributes to the bleeding after debridement. I'm going to change her to Iodoflex today. She is having a lot of discomfort with this we gave her tramadol which is reasonable 10/05/17;patient continues to have nonviable surface over a very difficult wound.we also note that her propensity to bleed. We have been using Iodoflex. previously minimal response previously  to [facility]. She tells me that her rheumatologist is made adjustments to both her  prednisone and methotrexate in attempt to help heal this difficult area. One would wonder if this is all venous insufficiency and whether the rheumatoid arthritis itself could be contributing to an inflammatory ulcer/rheumatoid vasculitis 10/12/17 She is here in follow up evaluation for a left lower extremity ulcer. There is no improvement. We will transition to santyl. 10/19/17; she has transitioned back to santyl and has been changing this every day. There has been some improvement although still requiring mechanical debridement 10/25/17; still using Santyl on the area on the left lower extremity. There is improvement for the first time. Recently no debridement was felt to be necessary.she did not respond well to Iodoflex. Request for fThera skin was denied by her insurance NAVEAH, [employer]RA[employer] ([id number]) 11/02/17; still using Santyl. She did not respond well to Iodoflex. We are getting a much better surface that this deep wound. Still requiring debridement.there is no advanced treatment option through her primary insurance [[employer]] 11/09/17; we are still using Santyl. Better looking surface still requiring mechanical debridement. There is on the left medial lower extremity.this initially started at the time of cellulitis in the left leg which may have been caused by a dog scratch. Had a lot of trouble getting in adequate surface on this wound. We did use Iodoflex for a period of time. we have not been able to get a proper surface to consider an alternative dressing 11/16/17; we finally have this wound down to a healthy granulated surface. We can graduate from [facility] to silver collagen. I have again reviewed the history of this apparently the patient had a scratch injury in the lateral aspect of her leg. In the course of wrapping this this was apparently a scissor injury at her primary's office. I'm he got this wrong and the original history 11/23/17; switch to Santyl collagen last week. Arrives today  with some slough over the surface over this represents an easy debridement. No change in dimensions. This was a scissor injury initially. 11/29/17; using collagen since last week. Wound surface is vibrant today. No change in dimensions however 12/07/17; wound is measuring smaller. Some surface left easily removed with gauze. Some hyper granulation generally the surface looks better.we have been using silver collagen I changed this to [facility] 12/14/17; wound is measuring smaller. Surface looks healthier. We've been using Hydrofera Blue under compression. 655/19; wound measures smaller however hyper granulation in the top 50% of the wound surface and some surrounding callus and skin that needed removing with a #3 curet. Otherwise this looks fairly good 01/11/18; the wound is much smaller. Fully epithelialized except for a small area in the middle that almost looks like a blister. Nevertheless this is epithelialized. I don't think is any need to do a specific topical dressing I would simply keep this area protected under her pressure stocking 02/01/18; I brought this lady back one more time to review the area on her left lateral calf. This was initially predominantly an infectious etiology this took quite a while to get to a viable surface however once it did it largely healed with Hydrofera Blue. Electronic Signature(s) Signed: 02/01/2018 6:13:38 PM By: Baltazar Najjar MD Entered By: Baltazar Najjar on 02/01/2018 15:08:11 [name] ([id number]) -------------------------------------------------------------------------------- Physical Exam Details Patient Name: [name] Date of Service: 02/01/2018 1:15 PM Medical Record Number: [id number] Patient Account Number: [id number] Date of Birth/Sex: [birth year] (84 y.o. F) Treating RN: Renne Crigler Primary Care Provider: Gabriel Cirri  Other Clinician: Referring Provider: Gabriel Cirri Treating Provider/Extender: Maxwell Caul [place] in Treatment: 21 Notes when exam; the patient wound is fully epithelialized. No evidence of surrounding infection. There is no uncontrolled edema Electronic Signature(s) Signed: 02/01/2018 6:13:38 PM By: Baltazar Najjar MD Entered By: Baltazar Najjar on 02/01/2018 15:08:35 [name] ([id number]) -------------------------------------------------------------------------------- Physician Orders Details Patient Name: [name] Date of Service: 02/01/2018 1:15 PM Medical Record Number: [id number] Patient Account Number: [id number] Date of Birth/Sex: [birth year] (84 y.o. F) Treating RN: Renne Crigler Primary Care Provider: Gabriel Cirri Other Clinician: Referring Provider: Gabriel Cirri Treating Provider/Extender: Altamese [place] in Treatment: 67 Verbal / Phone Orders: No Diagnosis Coding Discharge From [facility] o Discharge from Wound Care Center - treatment completed Electronic Signature(s) Signed: 02/01/2018 5:14:04 PM By: Renne Crigler Signed: 02/01/2018 6:13:38 PM By: Baltazar Najjar MD Entered By: Renne Crigler on 02/01/2018 13:36:36 [name] ([id number]) -------------------------------------------------------------------------------- Problem List Details Patient Name: [name] Date of Service: 02/01/2018 1:15 PM Medical Record Number: [id number] Patient Account Number: [id number] Date of Birth/Sex: 1931-08-11 (84 y.o. 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 09/07/2017 No Yes I87.312 Chronic venous hypertension (idiopathic) with ulcer of left 09/07/2017 No Yes lower extremity M05.80 Other rheumatoid  arthritis with rheumatoid factor of 09/07/2017 No Yes unspecified site Inactive Problems Resolved Problems Electronic Signature(s) Signed: 02/01/2018 6:13:38 PM By: Baltazar Najjar MD Entered By: Baltazar Najjar on 02/01/2018 15:06:15 [name] ([id number]) -------------------------------------------------------------------------------- Progress Note Details Patient Name: [name] Date of Service: 02/01/2018 1:15 PM Medical Record Number: [id number] Patient Account Number: [id number] Date of Birth/Sex: 1932-06-16 (84 y.o. F) Treating RN: Renne Crigler Primary Care Provider: Gabriel Cirri Other Clinician: Referring Provider: Gabriel Cirri Treating Provider/Extender: Altamese  in Treatment: 21 Subjective History of Present Illness (HPI) 09/07/17; this is an 84 year old woman who arrives accompanied by her daughter. She has a history of severe rheumatoid arthritis followed by Dr. Corliss Skains in [place]. By review of care everywhere it appears that she had cellulitis of her left lower leg in September 2018. This may have been caused by a dog scratch. She was followed and her primary care office at [facility] affiliated group. She received antibiotics, Silvadene cream as well as Una boots. This was followed through late October and the wound was healed out on 10/31. The patient and her daughters state that the current wound started with a scissor injury to the medial left calf before the wound was actually healed out in other words this is a different wound area. The original wound was more medial on the left anterior tibial area. Since then they've been using Silvadene cream and apparently she changed doctors who recommended Polysporin and they've been referred here for the injury on her left medial calf. She does not have a history of rheumatoid-related skin issues/vasculitis The patient is a type II diabetic with most recent hemoglobin A1c of 6. She has advanced  seropositive rheumatoid arthritis on prednisone 10 mg alternating with 5 and methotrexate. I see she is also on xarelto while Im not really sure why. She has significant osteoporosis, history of hypercalcemia. ABI in this clinic is 1.08. She was a former smoker 09/14/17; patient's wound slightly smaller today however still covered in a very thick adherent necrotic surface. We have been  using Santyl under compression. We finally got home health to start yesterday "advanced" 09/21/17; not too much change in the area here. We have been using Santyl under compression and I'll continue this after debridement today. Consider Iodoflex if we don't get a better looking wound surface 09/22/17-she presents today as an urgent follow-up secondary to uncontrolled bleeding overnight. She has saturated through the 3-layer compression. Upon dressing removal there is significant clot formation, she continues to bleed from the most proximal aspect of the ulcer. 4 silver nitrate sticks were used to try to obtain hemostasis, along with 10 minutes of pressure dressing. The wound continues to bleed although not as briskly; this appears to be more of a venous bleed and arterial. She is hemodynamically stable upon presentation to the clinic SBP 130's, HR 80's with no complaints of dizziness, lightheadedness. We are unable to obtain any hemostatic agents from the emergency department. Since she has bled through the pressure dressing and continues to bleed despite all efforts available in the clinic she has been sent to the emergency department for evaluation and treatment 09/28/17; the patient has not had an easy time since the last time I saw her. She continues to have nonviable surface over the wound perhaps slightly better using Santyl. She has on xarelto which no doubt contributes to the bleeding after debridement. I'm going to change her to Iodoflex today. She is having a lot of discomfort with this we gave her tramadol  which is reasonable 10/05/17;patient continues to have nonviable surface over a very difficult wound.we also note that her propensity to bleed. We have been using Iodoflex. previously minimal response previously to [employer]. She tells me that her rheumatologist is made adjustments to both her prednisone and methotrexate in attempt to help heal this difficult area. One would wonder if this is all venous insufficiency and whether the rheumatoid arthritis itself could be contributing to an inflammatory ulcer/rheumatoid vasculitis 10/12/17 She is here in follow up evaluation for a left lower extremity ulcer. There is no improvement. We will transition to santyl. 10/19/17; she has transitioned back to santyl and has been changing this every day. There has been some improvement although still requiring mechanical debridement 10/25/17; still using Santyl on the area on the left lower extremity. There is improvement for the first time. Recently no debridement was felt to be necessary.she did not respond well to Iodoflex. Request for fThera skin was denied by her insurance 11/02/17; still using Santyl. She did not respond well to Iodoflex. We are getting a much better surface that this deep wound. Still requiring debridement.there is no advanced treatment option through her primary insurance BRENDALYN, VALLELY ([id number]) 11/09/17; we are still using Santyl. Better looking surface still requiring mechanical debridement. There is on the left medial lower extremity.this initially started at the time of cellulitis in the left leg which may have been caused by a dog scratch. Had a lot of trouble getting in adequate surface on this wound. We did use Iodoflex for a period of time. we have not been able to get a proper surface to consider an alternative dressing 11/16/17; we finally have this wound down to a healthy granulated surface. We can graduate from [facility] to silver collagen. I have again reviewed the  history of this apparently the patient had a scratch injury in the lateral aspect of her leg. In the course of wrapping this this was apparently a scissor injury at her primary's office. I'm he got this wrong and the original  history 11/23/17; switch to Santyl collagen last week. Arrives today with some slough over the surface over this represents an easy debridement. No change in dimensions. This was a scissor injury initially. 11/29/17; using collagen since last week. Wound surface is vibrant today. No change in dimensions however 12/07/17; wound is measuring smaller. Some surface left easily removed with gauze. Some hyper granulation generally the surface looks better.we have been using silver collagen I changed this to [facility] 12/14/17; wound is measuring smaller. Surface looks healthier. We've been using Hydrofera Blue under compression. 655/19; wound measures smaller however hyper granulation in the top 50% of the wound surface and some surrounding callus and skin that needed removing with a #3 curet. Otherwise this looks fairly good 01/11/18; the wound is much smaller. Fully epithelialized except for a small area in the middle that almost looks like a blister. Nevertheless this is epithelialized. I don't think is any need to do a specific topical dressing I would simply keep this area protected under her pressure stocking 02/01/18; I brought this lady back one more time to review the area on her left lateral calf. This was initially predominantly an infectious etiology this took quite a while to get to a viable surface however once it did it largely healed with Hydrofera Blue. Objective Constitutional Vitals Time Taken: 1:21 PM, Height: 59 in, Weight: 117 lbs, BMI: 23.6, Temperature: 98.2 F, Pulse: 76 bpm, Respiratory Rate: 16 breaths/min, Blood Pressure: 143/60 mmHg. Assessment Active Problems ICD-10 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 muscle Chronic venous  hypertension (idiopathic) with ulcer of left lower extremity Other rheumatoid arthritis with rheumatoid factor of unspecified site Plan ALLURA, DOEPKE ([id number]) Discharge From [facility]: Discharge from Wound Care Center - treatment completed #1 the patient to be discharged from the clinic #2 initially cellulitis, probably some degree of chronic venous insufficiency however she does not have uncontrolled edema. No secondary prevention for now Electronic Signature(s) Signed: 02/01/2018 6:13:38 PM By: Baltazar Najjar MD Entered By: Baltazar Najjar on 02/01/2018 15:09:12 [name] ([id number]) -------------------------------------------------------------------------------- SuperBill Details Patient Name: [name] Date of Service: 02/01/2018 Medical Record Number: [id number] Patient Account Number: [id number] Date of Birth/Sex: 05-11-1932 (84 y.o. F) Treating RN: Renne Crigler Primary Care Provider: Gabriel Cirri Other Clinician: Referring Provider: Gabriel Cirri Treating Provider/Extender: Altamese [place] in Treatment: 21 Diagnosis Coding ICD-10 Codes Code Description [phone] 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 muscle I87.312 Chronic venous hypertension (idiopathic) with ulcer of left lower extremity M05.80 Other rheumatoid arthritis with rheumatoid factor of unspecified site Facility Procedures CPT4 Code: [id number] Description: [phone] - WOUND CARE VISIT-LEV 2 EST PT Modifier: Quantity: 1 Physician Procedures CPT4 Code: [id number] Description: 99213 - WC PHYS LEVEL 3 - EST PT ICD-10 Diagnosis Description L97.223 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 musc I87.312 Chronic venous hypertension (idiopathic) with ulcer of left l Modifier: le ower extremity Quantity: 1 Electronic Signature(s) Signed: 02/01/2018 6:13:38 PM By: Baltazar Najjar MD Entered By: Baltazar Najjar on 02/01/2018 15:09:44

## 2018-05-10 NOTE — Progress Notes (Signed)
Office Visit Note  Patient: Zoe Robinson             Date of Birth: 08-14-1931           MRN: [id number]             PCP: Darlis Loan, NP Referring: Darlis Loan, NP Visit Date: 05/24/2018 Occupation: _0 @  Subjective:  Medication management.   History of Present Illness: Zoe Robinson is a 84 y.o. female with history of seropositive rheumatoid arthritis.  She was accompanied by her daughter today.  According to her she was experiencing increasing stiffness about a month ago.  She has been more active in the last 1 month and the stiffness is improved.  She continues to take methotrexate 5 tablets/week along with folic acid and prednisone 5 alternating with 10 mg every other day which she has been taking for several years prescribed by Dr. Charlestine Night.  She was off methotrexate due to nonhealing ulcer on her lower extremity which has healed now and she has resumed methotrexate.  She restarted methotrexate in August.  Activities of Daily Living:  Patient reports morning stiffness for 0 minute.   Patient Denies nocturnal pain.  Difficulty dressing/grooming: Denies Difficulty climbing stairs: Reports Difficulty getting out of chair: Reports Difficulty using hands for taps, buttons, cutlery, and/or writing: Denies  Review of Systems  Constitutional: Negative for fatigue, night sweats, weight gain and weight loss.  HENT: Negative for mouth sores, trouble swallowing, trouble swallowing, mouth dryness and nose dryness.   Eyes: Negative for pain, redness, visual disturbance and dryness.  Respiratory: Negative for cough, shortness of breath and difficulty breathing.   Cardiovascular: Negative for chest pain, palpitations, hypertension, irregular heartbeat and swelling in legs/feet.  Gastrointestinal: Negative for blood in stool, constipation and diarrhea.  Endocrine: Negative for increased urination.  Genitourinary: Negative for vaginal dryness.  Musculoskeletal: Negative for  arthralgias, joint pain, joint swelling, myalgias, muscle weakness, morning stiffness, muscle tenderness and myalgias.  Skin: Negative for color change, rash, hair loss, skin tightness, ulcers and sensitivity to sunlight.  Allergic/Immunologic: Negative for susceptible to infections.  Neurological: Negative for dizziness, memory loss, night sweats and weakness.  Hematological: Negative for swollen glands.  Psychiatric/Behavioral: Negative for depressed mood and sleep disturbance. The patient is not nervous/anxious.     PMFS History:  Patient Active Problem List   Diagnosis Date Noted  . High risk medication use 03/25/2017  . Rheumatoid arthritis involving multiple sites with positive rheumatoid factor ([place]) 03/15/2017  . Rheumatoid arthritis involving both knees with positive rheumatoid factor ([place]) 03/15/2017  . Rheumatoid arthritis involving both hands with positive rheumatoid factor ([place]) 03/15/2017  . Rheumatoid arthritis involving both feet with positive rheumatoid factor ([place]) 03/15/2017  . History of vertebral fracture 03/15/2017  . On prednisone therapy 02/10/2017  . Age-related osteoporosis without current pathological fracture 02/10/2017  . History of pulmonary hypertension 02/10/2017  . History of atrial fibrillation 02/10/2017  . History of anemia 02/10/2017  . History of recurrent UTIs 02/10/2017  . History of hypercalcemia 02/10/2017  . On anticoagulant therapy 02/10/2017  . Acute encephalopathy 08/19/2014  . Neutropenia ([place]) 04/20/2014  . Fever, unspecified 04/20/2014  . Unspecified constipation 04/20/2014  . Hypercalcemia 04/15/2014  . Acute renal failure ([place]) 04/15/2014  . Generalized weakness 04/15/2014  . Dehydration 04/15/2014  . UTI (urinary tract infection) 04/15/2014  . Iron deficiency anemia 04/15/2014  . CKD (chronic kidney disease)   . Hypertension   . Atrial fibrillation ([place])     Past  Medical History:  Diagnosis Date  . Arthritis   . Atrial  fibrillation ([place])   . CKD (chronic kidney disease)   . Hypertension   . RA (rheumatoid arthritis) (HCC)     Family History  Problem Relation Age of Onset  . Heart attack Mother   . Heart attack Father   . Heart attack Brother   . Heart attack Brother   . Stomach cancer Brother   . Cancer Sister   . Dementia Sister   . Osteoarthritis Daughter   . Depression Daughter   . Hypothyroidism Daughter   . Hypercholesterolemia Daughter   . Heart disease Son   . Hypertension Son   . Gout Son    Past Surgical History:  Procedure Laterality Date  . ABDOMINAL HYSTERECTOMY     Social History   Social History Narrative  . Not on file    Immunization History  Administered Date(s) Administered  . Influenza,inj,Quad PF,6+ Mos 04/19/2014  . Pneumococcal Conjugate-13 05/16/2015  . Pneumococcal Polysaccharide-23 10/25/2010, 04/17/2014  . Tdap 10/25/2010   Objective: Vital Signs: BP (!) 148/65 (BP Location: Left Arm, Patient Position: Sitting, Cuff Size: Normal)   Pulse 72   Resp 12   Ht _0  (1.499 m)   Wt 111 lb 9.6 oz (50.6 kg)   BMI 22.54 kg/m    Physical Exam  Constitutional: She is oriented to person, place, and time. She appears well-developed and well-nourished.  HENT:  Head: Normocephalic and atraumatic.  Eyes: Conjunctivae and EOM are normal.  Neck: Normal range of motion.  Cardiovascular: Normal rate, regular rhythm, normal heart sounds and intact distal pulses.  Pulmonary/Chest: Effort normal and breath sounds normal.  Abdominal: Soft. Bowel sounds are normal.  Lymphadenopathy:    She has no cervical adenopathy.  Neurological: She is alert and oriented to person, place, and time.  Skin: Skin is warm and dry. Capillary refill takes less than 2 seconds.  Psychiatric: She has a normal mood and affect. Her behavior is normal.  Nursing note and vitals reviewed.    Musculoskeletal Exam: C-spine some limitation with range of motion.  She has good range of motion of  lumbar spine.  Shoulder joints elbow joints in good range of motion.  She has synovial thickening and ulnar deviation of all of her MCP joints.  PIP thickening was noted  Good range of motion of her knee joints with thickening of bilateral knee joints.  Synovial thickening in her MTPs.  No synovitis was noted on examination today.  CDAI Exam: CDAI Score: 0.8  Patient Global Assessment: 4 (mm); Provider Global Assessment: 4 (mm) Swollen: 0 ; Tender: 0  Joint Exam   Not documented   There is currently no information documented on the homunculus. Go to the Rheumatology activity and complete the homunculus joint exam.  Investigation: No additional findings.  Imaging: No results found.  Recent Labs: Lab Results  Component Value Date   WBC 8.2 01/11/2018   HGB 10.3 (L) 01/11/2018   PLT 362 01/11/2018   NA 134 (L) 01/11/2018   K 4.9 01/11/2018   CL 98 01/11/2018   CO2 31 01/11/2018   GLUCOSE 168 (H) 01/11/2018   BUN 19 01/11/2018   CREATININE 0.95 (H) 01/11/2018   BILITOT 0.3 01/11/2018   ALKPHOS 65 08/19/2014   AST 18 01/11/2018   ALT 11 01/11/2018   PROT 6.8 01/11/2018   ALBUMIN 2.6 (L) 08/19/2014   CALCIUM 9.5 01/11/2018   GFRAA 64 01/11/2018    Speciality  Comments: No specialty comments available.  Procedures:  No procedures performed Allergies: Patient has no known allergies.   Assessment / Plan:     Visit Diagnoses: Rheumatoid arthritis involving multiple sites with positive rheumatoid factor (HCC) - Positive RF, positive anti-CCP, positive ANA, high ESR.  Patient has severe rheumatoid arthritis with multiple contractures.  Patient had to come off methotrexate due to nonhealing foot ulcer.  She resumed her methotrexate in August.  She is doing better now with combination of methotrexate and prednisone.  We had detailed discussion regarding tapering prednisone.  She is currently on 10 mg alternating with 5 mg.  I have advised him to reduce it to 5 mg every day.  High  risk medication use - MTX 5 tabs po q wk, folic acid 64m po qd. - Plan: CBC with Differential/Platelet, COMPLETE METABOLIC PANEL WITH GFR today and then every 3 months to monitor for drug toxicity.  On prednisone therapy - She takes 5 mg /10 mg of prednisone every other day. Which was prescribed by Dr. TCharlestine Nightfor several years.  Ulcer of left lower extremity, unspecified ulcer stage ([place] -she has been discharged from wound clinic.  Rheumatoid arthritis involving both hands with positive rheumatoid factor (HCC)-she has no synovitis on examination but she continues to have synovial thickening.  Rheumatoid arthritis involving both knees with positive rheumatoid factor (HCC)-she has synovial thickening with no active synovitis.  Rheumatoid arthritis involving both feet with positive rheumatoid factor (HCC)-currently not having discomfort.  Age-related osteoporosis without current pathological fracture - Patient has been started on Prolia by her PCP  History of vertebral fracture  Acute cystitis without hematuria - Recurrent. Patient was referred to urology. She states she has been treated for UTI by her PCP and she prefers not to see urologist at this time  On anticoagulant therapy  History of atrial fibrillation  Hypercalcemia  History of pulmonary hypertension   Recommend flu and Shingrix as indicated. Orders: Orders Placed This Encounter  Procedures  . CBC with Differential/Platelet  . COMPLETE METABOLIC PANEL WITH GFR   No orders of the defined types were placed in this encounter.     Follow-Up Instructions: Return in about 5 months (around 10/23/2018) for Rheumatoid arthritis, Osteoporosis.   SBo Merino MD  Note - This record has been created using DEditor, commissioning  Chart creation errors have been sought, but may not always  have been located. Such creation errors do not reflect on  the standard of medical care.

## 2018-05-15 ENCOUNTER — Other Ambulatory Visit: Payer: Self-pay | Admitting: Rheumatology

## 2018-05-16 NOTE — Telephone Encounter (Signed)
Last visit: 10/04/2017 Next visit: 05/24/2018 Labs: 01/11/2018 Is mildly elevated. Patient is on methotrexate. We will continue to monitor.  Attempted to call patient and left message on machine to advise patient she is due to update labs.   Okay to refill 30 day supply, per Dr. Corliss Skains.

## 2018-05-24 ENCOUNTER — Encounter: Payer: Self-pay | Admitting: Rheumatology

## 2018-05-24 ENCOUNTER — Ambulatory Visit: Payer: Medicare Other | Admitting: Rheumatology

## 2018-05-24 VITALS — BP 148/65 | HR 72 | Resp 12 | Ht 59.0 in | Wt 111.6 lb

## 2018-05-24 DIAGNOSIS — Z8679 Personal history of other diseases of the circulatory system: Secondary | ICD-10-CM

## 2018-05-24 DIAGNOSIS — Z7901 Long term (current) use of anticoagulants: Secondary | ICD-10-CM

## 2018-05-24 DIAGNOSIS — M05772 Rheumatoid arthritis with rheumatoid factor of left ankle and foot without organ or systems involvement: Secondary | ICD-10-CM

## 2018-05-24 DIAGNOSIS — N3000 Acute cystitis without hematuria: Secondary | ICD-10-CM

## 2018-05-24 DIAGNOSIS — M05742 Rheumatoid arthritis with rheumatoid factor of left hand without organ or systems involvement: Secondary | ICD-10-CM

## 2018-05-24 DIAGNOSIS — Z7952 Long term (current) use of systemic steroids: Secondary | ICD-10-CM | POA: Diagnosis not present

## 2018-05-24 DIAGNOSIS — M810 Age-related osteoporosis without current pathological fracture: Secondary | ICD-10-CM

## 2018-05-24 DIAGNOSIS — Z79899 Other long term (current) drug therapy: Secondary | ICD-10-CM

## 2018-05-24 DIAGNOSIS — M05761 Rheumatoid arthritis with rheumatoid factor of right knee without organ or systems involvement: Secondary | ICD-10-CM

## 2018-05-24 DIAGNOSIS — L97929 Non-pressure chronic ulcer of unspecified part of left lower leg with unspecified severity: Secondary | ICD-10-CM

## 2018-05-24 DIAGNOSIS — M0579 Rheumatoid arthritis with rheumatoid factor of multiple sites without organ or systems involvement: Secondary | ICD-10-CM

## 2018-05-24 DIAGNOSIS — M05771 Rheumatoid arthritis with rheumatoid factor of right ankle and foot without organ or systems involvement: Secondary | ICD-10-CM

## 2018-05-24 DIAGNOSIS — Z8781 Personal history of (healed) traumatic fracture: Secondary | ICD-10-CM

## 2018-05-24 DIAGNOSIS — M05762 Rheumatoid arthritis with rheumatoid factor of left knee without organ or systems involvement: Secondary | ICD-10-CM

## 2018-05-24 DIAGNOSIS — M05741 Rheumatoid arthritis with rheumatoid factor of right hand without organ or systems involvement: Secondary | ICD-10-CM

## 2018-05-24 LAB — COMPLETE METABOLIC PANEL WITH GFR
AG Ratio: 1.4 (calc) (ref 1.0–2.5)
ALT: 14 U/L (ref 6–29)
AST: 21 U/L (ref 10–35)
Albumin: 4 g/dL (ref 3.6–5.1)
Alkaline phosphatase (APISO): 49 U/L (ref 33–130)
BUN/Creatinine Ratio: 21 (calc) (ref 6–22)
BUN: 21 mg/dL (ref 7–25)
CALCIUM: 10 mg/dL (ref 8.6–10.4)
CO2: 31 mmol/L (ref 20–32)
Chloride: 100 mmol/L (ref 98–110)
Creat: 0.99 mg/dL — ABNORMAL HIGH (ref 0.60–0.88)
GFR, EST NON AFRICAN AMERICAN: 52 mL/min/{1.73_m2} — AB (ref >=60)
GFR, Est African American: 61 mL/min/{1.73_m2} (ref >=60)
Globulin: 2.8 g/dL (calc) (ref 1.9–3.7)
Glucose, Bld: 102 mg/dL — ABNORMAL HIGH (ref 65–99)
POTASSIUM: 4.6 mmol/L (ref 3.5–5.3)
Sodium: 139 mmol/L (ref 135–146)
Total Bilirubin: 0.4 mg/dL (ref 0.2–1.2)
Total Protein: 6.8 g/dL (ref 6.1–8.1)

## 2018-05-24 LAB — CBC WITH DIFFERENTIAL/PLATELET
Basophils Absolute: 38 cells/uL (ref 0–200)
Basophils Relative: 0.6 %
Eosinophils Absolute: 13 cells/uL — ABNORMAL LOW (ref 15–500)
Eosinophils Relative: 0.2 %
HCT: 35.1 % (ref 35.0–45.0)
Hemoglobin: 11.6 g/dL — ABNORMAL LOW (ref 11.7–15.5)
LYMPHS ABS: 731 {cells}/uL — AB (ref 850–3900)
MCH: 28.9 pg (ref 27.0–33.0)
MCHC: 33 g/dL (ref 32.0–36.0)
MCV: 87.3 fL (ref 80.0–100.0)
MPV: 9.9 fL (ref 7.5–12.5)
Monocytes Relative: 3.2 %
NEUTROS PCT: 84.4 %
Neutro Abs: 5317 cells/uL (ref 1500–7800)
PLATELETS: 315 10*3/uL (ref 140–400)
RBC: 4.02 10*6/uL (ref 3.80–5.10)
RDW: 15.1 % — AB (ref 11.0–15.0)
TOTAL LYMPHOCYTE: 11.6 %
WBC mixed population: 202 cells/uL (ref 200–950)
WBC: 6.3 10*3/uL (ref 3.8–10.8)

## 2018-05-24 NOTE — Patient Instructions (Addendum)
Standing Labs We placed an order today for your standing lab work.    Please come back and get your standing labs in January and every 3 months  We have open lab Monday through Friday from 8:30-11:30 AM and 1:30-4:00 PM  at the office of Dr. Pollyann Savoy.   You may experience shorter wait times on Monday and Friday afternoons. The office is located at [address], [address], [place], [place] [address] No appointment is necessary.   Labs are drawn by [facility].  You may receive a bill from [facility] for your lab work. If you have any questions regarding directions or hours of operation,  please call [phone].   Just as a reminder please drink plenty of water prior to coming for your lab work. Thanks!   Recommend following vaccines: Flu Shingrix

## 2018-05-25 NOTE — Progress Notes (Signed)
Labs are stable.

## 2018-09-20 ENCOUNTER — Telehealth: Payer: Self-pay | Admitting: Rheumatology

## 2018-09-20 MED ORDER — METHOTREXATE 2.5 MG PO TABS: 12.5000 mg | ORAL_TABLET | ORAL | 0 refills | Status: DC

## 2018-09-20 NOTE — Telephone Encounter (Signed)
Last Visit: 05/24/18 Next visit: 11/07/18 Labs: 08/23/18 CBC/cmp wnl  Okay to refill per Dr. Corliss Skains

## 2018-09-20 NOTE — Telephone Encounter (Signed)
Patient's daughter Sedalia Muta left a voicemail stating she received a call from [facility] that she needed to contact Dr. Fatima Sanger office regarding her mom's refill of Methotrexate.  [name] requested a return call.

## 2018-09-20 NOTE — Telephone Encounter (Signed)
Spoke with [name] and advised patient needs updated labs. She will have lab work from PCP sent to office and will refill prescription once received.

## 2018-09-20 NOTE — Addendum Note (Signed)
Addended by: Henriette Combs on: 09/20/2018 05:01 PM   Modules accepted: Orders

## 2018-10-16 ENCOUNTER — Other Ambulatory Visit: Payer: Self-pay | Admitting: Rheumatology

## 2018-10-16 NOTE — Telephone Encounter (Signed)
Last Visit: 05/24/2018 Next Visit: 11/07/2018 Labs: 08/23/2018  Okay to refill per Dr. Corliss Skains.

## 2018-11-07 ENCOUNTER — Ambulatory Visit: Payer: Medicare Other | Admitting: Rheumatology

## 2019-01-09 NOTE — Progress Notes (Deleted)
Office Visit Note  Patient: Zoe Robinson             Date of Birth: [birth year]           MRN: [id number]             PCP: Gabriel Cirri, NP Referring: Gabriel Cirri, NP Visit Date: 01/18/2019 Occupation: @GUAROCC @  Subjective:  No chief complaint on file.   History of Present Illness: Zoe Robinson is a 85 y.o. female ***   Activities of Daily Living:  Patient reports morning stiffness for *** {minute/hour:19697}.   Patient {ACTIONS;DENIES/REPORTS:21021675::"Denies"} nocturnal pain.  Difficulty dressing/grooming: {ACTIONS;DENIES/REPORTS:21021675::"Denies"} Difficulty climbing stairs: {ACTIONS;DENIES/REPORTS:21021675::"Denies"} Difficulty getting out of chair: {ACTIONS;DENIES/REPORTS:21021675::"Denies"} Difficulty using hands for taps, buttons, cutlery, and/or writing: {ACTIONS;DENIES/REPORTS:21021675::"Denies"}  No Rheumatology ROS completed.   PMFS History:  Patient Active Problem List   Diagnosis Date Noted  . High risk medication use 03/25/2017  . Rheumatoid arthritis involving multiple sites with positive rheumatoid factor (HCC) 03/15/2017  . Rheumatoid arthritis involving both knees with positive rheumatoid factor (HCC) 03/15/2017  . Rheumatoid arthritis involving both hands with positive rheumatoid factor (HCC) 03/15/2017  . Rheumatoid arthritis involving both feet with positive rheumatoid factor (HCC) 03/15/2017  . History of vertebral fracture 03/15/2017  . On prednisone therapy 02/10/2017  . Age-related osteoporosis without current pathological fracture 02/10/2017  . History of pulmonary hypertension 02/10/2017  . History of atrial fibrillation 02/10/2017  . History of anemia 02/10/2017  . History of recurrent UTIs 02/10/2017  . History of hypercalcemia 02/10/2017  . On anticoagulant therapy 02/10/2017  . Acute encephalopathy 08/19/2014  . Neutropenia (HCC) 04/20/2014  . Fever, unspecified 04/20/2014  . Unspecified constipation 04/20/2014  .  Hypercalcemia 04/15/2014  . Acute renal failure (HCC) 04/15/2014  . Generalized weakness 04/15/2014  . Dehydration 04/15/2014  . UTI (urinary tract infection) 04/15/2014  . Iron deficiency anemia 04/15/2014  . CKD (chronic kidney disease)   . Hypertension   . Atrial fibrillation [facility])     Past Medical History:  Diagnosis Date  . Arthritis   . Atrial fibrillation (HCC)   . CKD (chronic kidney disease)   . Hypertension   . RA (rheumatoid arthritis) (HCC)     Family History  Problem Relation Age of Onset  . Heart attack Mother   . Heart attack Father   . Heart attack Brother   . Heart attack Brother   . Stomach cancer Brother   . Cancer Sister   . Dementia Sister   . Osteoarthritis Daughter   . Depression Daughter   . Hypothyroidism Daughter   . Hypercholesterolemia Daughter   . Heart disease Son   . Hypertension Son   . Gout Son    Past Surgical History:  Procedure Laterality Date  . ABDOMINAL HYSTERECTOMY     Social History   Social History Narrative  . Not on file   Immunization History  Administered Date(s) Administered  . Influenza,inj,Quad PF,6+ Mos 04/19/2014  . Pneumococcal Conjugate-13 05/16/2015  . Pneumococcal Polysaccharide-23 10/25/2010, 04/17/2014  . Tdap 10/25/2010     Objective: Vital Signs: There were no vitals taken for this visit.   Physical Exam   Musculoskeletal Exam: ***  CDAI Exam: CDAI Score: - Patient Global: -; Provider Global: - Swollen: -; Tender: - Joint Exam   No joint exam has been documented for this visit   There is currently no information documented on the homunculus. Go to the Rheumatology activity and complete the homunculus joint exam.  Investigation: No additional findings.  Imaging: No results found.  Recent Labs: Lab Results  Component Value Date   WBC 6.3 05/24/2018   HGB 11.6 (L) 05/24/2018   PLT 315 05/24/2018   NA 139 05/24/2018   K 4.6 05/24/2018   CL 100 05/24/2018   CO2 31 05/24/2018    GLUCOSE 102 (H) 05/24/2018   BUN 21 05/24/2018   CREATININE 0.99 (H) 05/24/2018   BILITOT 0.4 05/24/2018   ALKPHOS 65 08/19/2014   AST 21 05/24/2018   ALT 14 05/24/2018   PROT 6.8 05/24/2018   ALBUMIN 2.6 (L) 08/19/2014   CALCIUM 10.0 05/24/2018   GFRAA 61 05/24/2018    Speciality Comments: No specialty comments available.  Procedures:  No procedures performed Allergies: Patient has no known allergies.   Assessment / Plan:     Visit Diagnoses: No diagnosis found.   Orders: No orders of the defined types were placed in this encounter.  No orders of the defined types were placed in this encounter.   Face-to-face time spent with patient was *** minutes. Greater than 50% of time was spent in counseling and coordination of care.  Follow-Up Instructions: No follow-ups on file.   Earnestine Mealing, CMA  Note - This record has been created using Editor, commissioning.  Chart creation errors have been sought, but may not always  have been located. Such creation errors do not reflect on  the standard of medical care.

## 2019-01-15 ENCOUNTER — Other Ambulatory Visit: Payer: Self-pay | Admitting: Rheumatology

## 2019-01-15 NOTE — Telephone Encounter (Addendum)
Last Visit: 05/24/2018 Next Visit: 03/15/19  Labs: 08/23/18 Albumin 3.5, CO2 33  Patient's daughter advised she is due for labs. She states she will take her to update this week.  Okay to refill 30 day supply MTX?

## 2019-01-15 NOTE — Telephone Encounter (Signed)
Ok to give 30 d refill.

## 2019-01-18 ENCOUNTER — Ambulatory Visit: Payer: Self-pay | Admitting: Rheumatology

## 2019-01-19 ENCOUNTER — Other Ambulatory Visit: Payer: Self-pay | Admitting: *Deleted

## 2019-01-19 DIAGNOSIS — Z79899 Other long term (current) drug therapy: Secondary | ICD-10-CM

## 2019-02-09 ENCOUNTER — Other Ambulatory Visit: Payer: Self-pay | Admitting: Rheumatology

## 2019-02-19 ENCOUNTER — Other Ambulatory Visit: Payer: Self-pay | Admitting: Rheumatology

## 2019-02-19 ENCOUNTER — Telehealth: Payer: Self-pay | Admitting: Rheumatology

## 2019-02-19 DIAGNOSIS — Z79899 Other long term (current) drug therapy: Secondary | ICD-10-CM

## 2019-02-19 NOTE — Telephone Encounter (Signed)
Patient going to [facility] in [place] tomorrow for labs. Please release orders. 

## 2019-02-20 ENCOUNTER — Telehealth: Payer: Self-pay | Admitting: Rheumatology

## 2019-02-20 NOTE — Telephone Encounter (Signed)
Lab orders released.  

## 2019-02-20 NOTE — Telephone Encounter (Signed)
Patient's daughter advised lab orders have been faxed.

## 2019-02-20 NOTE — Telephone Encounter (Signed)
Patient left a voicemail stating she called earlier today to make sure her labwork orders had been sent to [facility] in [place].  Patient states she was told "the lab orders had been released."  Patient states she called [facility] a few minutes ago and they still do not have the orders.  Patient is requesting the orders be faxed [phone].  Patient is requesting a return call to confirm the orders have been sent.

## 2019-02-22 ENCOUNTER — Other Ambulatory Visit: Payer: Self-pay | Admitting: *Deleted

## 2019-02-22 LAB — CBC WITH DIFFERENTIAL/PLATELET
Basophils Absolute: 0 10*3/uL (ref 0.0–0.2)
Basos: 1 %
EOS (ABSOLUTE): 0.1 10*3/uL (ref 0.0–0.4)
Eos: 1 %
Hematocrit: 31.1 % — ABNORMAL LOW (ref 34.0–46.6)
Hemoglobin: 10.6 g/dL — ABNORMAL LOW (ref 11.1–15.9)
Lymphocytes Absolute: 1.2 10*3/uL (ref 0.7–3.1)
Lymphs: 19 %
MCH: 30.2 pg (ref 26.6–33.0)
MCHC: 34.1 g/dL (ref 31.5–35.7)
MCV: 89 fL (ref 79–97)
Monocytes Absolute: 0.6 10*3/uL (ref 0.1–0.9)
Monocytes: 9 %
Neutrophils Absolute: 4.5 10*3/uL (ref 1.4–7.0)
Neutrophils: 70 %
Platelets: 255 10*3/uL (ref 150–450)
RBC: 3.51 x10E6/uL — ABNORMAL LOW (ref 3.77–5.28)
RDW: 15.9 % — ABNORMAL HIGH (ref 11.7–15.4)
WBC: 6.4 10*3/uL (ref 3.4–10.8)

## 2019-02-22 LAB — CMP14+EGFR
ALT: 13 IU/L (ref 0–32)
AST: 19 IU/L (ref 0–40)
Albumin/Globulin Ratio: 1.7 (ref 1.2–2.2)
Albumin: 4.1 g/dL (ref 3.6–4.6)
Alkaline Phosphatase: 48 IU/L (ref 39–117)
BUN/Creatinine Ratio: 30 — ABNORMAL HIGH (ref 12–28)
BUN: 31 mg/dL — ABNORMAL HIGH (ref 8–27)
Bilirubin Total: 0.2 mg/dL (ref 0.0–1.2)
CO2: 30 mmol/L — ABNORMAL HIGH (ref 20–29)
Calcium: 10.2 mg/dL (ref 8.7–10.3)
Chloride: 103 mmol/L (ref 96–106)
Creatinine, Ser: 1.03 mg/dL — ABNORMAL HIGH (ref 0.57–1.00)
GFR calc Af Amer: 58 mL/min/{1.73_m2} — ABNORMAL LOW (ref >59)
GFR calc non Af Amer: 50 mL/min/{1.73_m2} — ABNORMAL LOW (ref >59)
Globulin, Total: 2.4 g/dL (ref 1.5–4.5)
Glucose: 114 mg/dL — ABNORMAL HIGH (ref 65–99)
Potassium: 5.3 mmol/L — ABNORMAL HIGH (ref 3.5–5.2)
Sodium: 140 mmol/L (ref 134–144)
Total Protein: 6.5 g/dL (ref 6.0–8.5)

## 2019-02-22 MED ORDER — METHOTREXATE 2.5 MG PO TABS: ORAL_TABLET | ORAL | 0 refills | Status: DC

## 2019-02-22 NOTE — Telephone Encounter (Signed)
Last Visit: 05/25/19 Next Visit: 03/15/19  Labs: 02/21/19 Creat 1.03, GFR 50,   Okay to refill MTX?

## 2019-02-22 NOTE — Telephone Encounter (Signed)
Reduce dose of methotrexate to 3 tablets/week.  Please schedule an appointment.  She will not get any further refills after this month supply.

## 2019-02-22 NOTE — Telephone Encounter (Signed)
Spoke with patient's daughter and advised to have patient reduce MTX to 3 tabs per week. Advised unable to refill again until after patient is seen in the office per Dr. Estanislado Pandy.

## 2019-02-22 NOTE — Telephone Encounter (Signed)
Creatinine is mildly elevated.  Please forward labs to her PCP.  Patient also needs a follow-up appointment.  She was last seen in October 2019.

## 2019-03-01 NOTE — Progress Notes (Signed)
Office Visit Note  Patient: Zoe Robinson             Date of Birth: 1931-08-19           MRN: [id number]             PCP: Darlis Loan, NP Referring: Darlis Loan, NP Visit Date: 03/15/2019 Occupation: _0 @  Subjective:  Medication monitoring.   History of Present Illness: Zoe Robinson is a 85 y.o. female with history of severe rheumatoid arthritis.  She states she has been doing well without any increased joint pain or joint swelling.  She continues to take prednisone 5 mg alternating with 10 mg every other day which was prescribed by Dr. Charlestine Night several years ago.  She does not want to change the dose.  Her methotrexate dose was recently decreased from 5 to 3 tablets/week due to elevated creatinine.  Activities of Daily Living:  Patient reports morning stiffness for 0 minute.   Patient Denies nocturnal pain.  Difficulty dressing/grooming: Reports Difficulty climbing stairs: Reports Difficulty getting out of chair: Reports Difficulty using hands for taps, buttons, cutlery, and/or writing: Reports  Review of Systems  Constitutional: Negative for fatigue, night sweats, weight gain and weight loss.  HENT: Negative for mouth sores, trouble swallowing, trouble swallowing, mouth dryness and nose dryness.   Eyes: Negative for pain, redness, visual disturbance and dryness.  Respiratory: Negative for cough, shortness of breath and difficulty breathing.   Cardiovascular: Negative for chest pain, palpitations, hypertension, irregular heartbeat and swelling in legs/feet.  Gastrointestinal: Negative for blood in stool, constipation and diarrhea.  Endocrine: Negative for increased urination.  Genitourinary: Negative for vaginal dryness.  Musculoskeletal: Negative for arthralgias, joint pain, joint swelling, myalgias, muscle weakness, morning stiffness, muscle tenderness and myalgias.  Skin: Negative for color change, rash, hair loss, skin tightness, ulcers and sensitivity  to sunlight.  Allergic/Immunologic: Negative for susceptible to infections.  Neurological: Negative for dizziness, memory loss, night sweats and weakness.  Hematological: Negative for swollen glands.  Psychiatric/Behavioral: Negative for depressed mood and sleep disturbance. The patient is not nervous/anxious.     PMFS History:  Patient Active Problem List   Diagnosis Date Noted  . High risk medication use 03/25/2017  . Rheumatoid arthritis involving multiple sites with positive rheumatoid factor ([place]) 03/15/2017  . Rheumatoid arthritis involving both knees with positive rheumatoid factor ([place]) 03/15/2017  . Rheumatoid arthritis involving both hands with positive rheumatoid factor ([place]) 03/15/2017  . Rheumatoid arthritis involving both feet with positive rheumatoid factor ([place]) 03/15/2017  . History of vertebral fracture 03/15/2017  . On prednisone therapy 02/10/2017  . Age-related osteoporosis without current pathological fracture 02/10/2017  . History of pulmonary hypertension 02/10/2017  . History of atrial fibrillation 02/10/2017  . History of anemia 02/10/2017  . History of recurrent UTIs 02/10/2017  . History of hypercalcemia 02/10/2017  . On anticoagulant therapy 02/10/2017  . Acute encephalopathy 08/19/2014  . Neutropenia ([place]) 04/20/2014  . Fever, unspecified 04/20/2014  . Unspecified constipation 04/20/2014  . Hypercalcemia 04/15/2014  . Acute renal failure ([place]) 04/15/2014  . Generalized weakness 04/15/2014  . Dehydration 04/15/2014  . UTI (urinary tract infection) 04/15/2014  . Iron deficiency anemia 04/15/2014  . CKD (chronic kidney disease)   . Hypertension   . Atrial fibrillation [facility])     Past Medical History:  Diagnosis Date  . Arthritis   . Atrial fibrillation ([place])   . CKD (chronic kidney disease)   . Hypertension   . RA (rheumatoid arthritis) ([place])  Family History  Problem Relation Age of Onset  . Heart attack Mother   . Heart attack Father   .  Heart attack Brother   . Heart attack Brother   . Stomach cancer Brother   . Cancer Sister   . Dementia Sister   . Osteoarthritis Daughter   . Depression Daughter   . Hypothyroidism Daughter   . Hypercholesterolemia Daughter   . Heart disease Son   . Hypertension Son   . Gout Son    Past Surgical History:  Procedure Laterality Date  . ABDOMINAL HYSTERECTOMY     Social History   Social History Narrative  . Not on file   Immunization History  Administered Date(s) Administered  . Influenza,inj,Quad PF,6+ Mos 04/19/2014  . Pneumococcal Conjugate-13 05/16/2015  . Pneumococcal Polysaccharide-23 10/25/2010, 04/17/2014  . Tdap 10/25/2010     Objective: Vital Signs: BP 137/66 (BP Location: Left Arm, Patient Position: Sitting, Cuff Size: Normal)   Pulse 70   Resp 12   Ht _0  (1.499 m)   Wt 112 lb 3.2 oz (50.9 kg)   BMI 22.66 kg/m    Physical Exam Vitals signs and nursing note reviewed.  Constitutional:      Appearance: She is well-developed.  HENT:     Head: Normocephalic and atraumatic.  Eyes:     Conjunctiva/sclera: Conjunctivae normal.  Neck:     Musculoskeletal: Normal range of motion.  Cardiovascular:     Rate and Rhythm: Normal rate and regular rhythm.     Heart sounds: Normal heart sounds.  Pulmonary:     Effort: Pulmonary effort is normal.     Breath sounds: Normal breath sounds.  Abdominal:     General: Bowel sounds are normal.     Palpations: Abdomen is soft.  Lymphadenopathy:     Cervical: No cervical adenopathy.  Skin:    General: Skin is warm and dry.     Capillary Refill: Capillary refill takes less than 2 seconds.  Neurological:     Mental Status: She is alert and oriented to person, place, and time.  Psychiatric:        Behavior: Behavior normal.      Musculoskeletal Exam: C-spine was in good range of motion.  Shoulder joints, elbow joints were in good range of motion.  She has synovial thickening over bilateral wrist joints.  She has  subluxation and ulnar deviation of almost all MCP joints.  PIP and DIP joints are also subluxed with thickening.  She has good range of motion of her hip joints and knee joints.  She has rheumatoid arthritis changes in her MTPs and PIPs with subluxation of the joints.  CDAI Exam: CDAI Score: 0.2  Patient Global: 0 mm; Provider Global: 2 mm Swollen: 0 ; Tender: 0  Joint Exam   No joint exam has been documented for this visit   There is currently no information documented on the homunculus. Go to the Rheumatology activity and complete the homunculus joint exam.  Investigation: No additional findings.  Imaging: No results found.  Recent Labs: Lab Results  Component Value Date   WBC 6.4 02/21/2019   HGB 10.6 (L) 02/21/2019   PLT 255 02/21/2019   NA 140 02/21/2019   K 5.3 (H) 02/21/2019   CL 103 02/21/2019   CO2 30 (H) 02/21/2019   GLUCOSE 114 (H) 02/21/2019   BUN 31 (H) 02/21/2019   CREATININE 1.03 (H) 02/21/2019   BILITOT 0.2 02/21/2019   ALKPHOS 48 02/21/2019   AST  19 02/21/2019   ALT 13 02/21/2019   PROT 6.5 02/21/2019   ALBUMIN 4.1 02/21/2019   CALCIUM 10.2 02/21/2019   GFRAA 58 (L) 02/21/2019    Speciality Comments: No specialty comments available.  Procedures:  No procedures performed Allergies: Patient has no known allergies.   Assessment / Plan:     Visit Diagnoses: Rheumatoid arthritis involving multiple sites with positive rheumatoid factor (HCC) -  Positive RF, positive anti-CCP, positive ANA, high ESR.  Patient has severe rheumatoid arthritis with multiple contractures.  Patient has severe end-stage rheumatoid arthritis.  She has been treated with prednisone therapy by Dr. Charlestine Night for many years.  She does not want to change prednisone.  She continues to take prednisone 5 mg alternating with 10 mg.  She is also has been on methotrexate and the dose was recently reduced to 3 tablets/week.  She has not noticed any worsening of her symptoms.  High risk  medication use - MTX 3 tabs po q wk, folic acid 64m po qd. her labs from July 2020 showed mild elevation of creatinine.  At the time methotrexate dose was reduced.  We will check labs again in October.  She will get labs every 3 months to monitor for drug toxicity.  On prednisone therapy - She takes 5 mg /10 mg of prednisone every other day. Which was prescribed by Dr. TCharlestine Nightfor several years.  Ulcer of left lower extremity, unspecified ulcer stage ([place] - Discharged from wound care.  She had no recurrence of the ulcers.  Age-related osteoporosis without current pathological fracture - Managed by her PCP.  She is on Prolia 60 mg every 6 months.  History of vertebral fracture  On anticoagulant therapy  History of atrial fibrillation  History of pulmonary hypertension  Hypercalcemia  Orders: No orders of the defined types were placed in this encounter.  No orders of the defined types were placed in this encounter.   Follow-Up Instructions: Return in about 5 months (around 08/15/2019) for Rheumatoid arthritis.   SBo Merino MD  Note - This record has been created using DEditor, commissioning  Chart creation errors have been sought, but may not always  have been located. Such creation errors do not reflect on  the standard of medical care.

## 2019-03-08 ENCOUNTER — Other Ambulatory Visit: Payer: Self-pay | Admitting: Rheumatology

## 2019-03-15 ENCOUNTER — Ambulatory Visit (INDEPENDENT_AMBULATORY_CARE_PROVIDER_SITE_OTHER): Payer: Medicare Other | Admitting: Rheumatology

## 2019-03-15 ENCOUNTER — Other Ambulatory Visit: Payer: Self-pay

## 2019-03-15 ENCOUNTER — Encounter: Payer: Self-pay | Admitting: Rheumatology

## 2019-03-15 VITALS — BP 137/66 | HR 70 | Resp 12 | Ht 59.0 in | Wt 112.2 lb

## 2019-03-15 DIAGNOSIS — Z8781 Personal history of (healed) traumatic fracture: Secondary | ICD-10-CM

## 2019-03-15 DIAGNOSIS — L97929 Non-pressure chronic ulcer of unspecified part of left lower leg with unspecified severity: Secondary | ICD-10-CM

## 2019-03-15 DIAGNOSIS — Z8679 Personal history of other diseases of the circulatory system: Secondary | ICD-10-CM

## 2019-03-15 DIAGNOSIS — Z79899 Other long term (current) drug therapy: Secondary | ICD-10-CM

## 2019-03-15 DIAGNOSIS — M0579 Rheumatoid arthritis with rheumatoid factor of multiple sites without organ or systems involvement: Secondary | ICD-10-CM

## 2019-03-15 DIAGNOSIS — M810 Age-related osteoporosis without current pathological fracture: Secondary | ICD-10-CM

## 2019-03-15 DIAGNOSIS — Z7952 Long term (current) use of systemic steroids: Secondary | ICD-10-CM

## 2019-03-15 DIAGNOSIS — Z7901 Long term (current) use of anticoagulants: Secondary | ICD-10-CM

## 2019-03-15 NOTE — Patient Instructions (Signed)
Standing Labs We placed an order today for your standing lab work.    Please come back and get your standing labs in October and every 3 months   We have open lab daily Monday through Thursday from 8:30-12:30 PM and 1:30-4:30 PM and Friday from 8:30-12:30 PM and 1:30 -4:00 PM at the office of Dr. Arash Karstens.   You may experience shorter wait times on Monday and Friday afternoons. The office is located at [address], [address], [place], [place] [address] No appointment is necessary.   Labs are drawn by [facility].  You may receive a bill from [facility] for your lab work.  If you wish to have your labs drawn at another location, please call the office 24 hours in advance to send orders.  If you have any questions regarding directions or hours of operation,  please call [phone].   Just as a reminder please drink plenty of water prior to coming for your lab work. Thanks!   

## 2019-03-20 ENCOUNTER — Other Ambulatory Visit: Payer: Self-pay | Admitting: Rheumatology

## 2019-03-20 NOTE — Telephone Encounter (Signed)
Last Visit: 03/15/19 Next Visit: 08/15/19 Labs: 02/21/19 Creatinine is mildly elevated  Okay to refill per Dr. Estanislado Pandy

## 2019-06-07 ENCOUNTER — Telehealth: Payer: Self-pay | Admitting: Rheumatology

## 2019-06-07 MED ORDER — METHOTREXATE 2.5 MG PO TABS: ORAL_TABLET | ORAL | 0 refills | Status: DC

## 2019-06-07 NOTE — Telephone Encounter (Signed)
Patient's caregiver and neighbor Butch Penny called requesting prescription refill of Methotrexate to be sent to [facility] at [address] in [place].  Patient is out of tablets and due to take them next Wednesday, 06/13/19.

## 2019-06-07 NOTE — Telephone Encounter (Signed)
Last Visit: 03/15/19 Next Visit: 08/15/19 Labs: 02/21/19 Creatinine is mildly elevated  Left message to advise patient's daughter that the patient is due to update labs.   Okay to refill 30 day supply per Dr. Estanislado Pandy

## 2019-06-28 ENCOUNTER — Telehealth: Payer: Self-pay

## 2019-06-28 ENCOUNTER — Other Ambulatory Visit: Payer: Self-pay | Admitting: Rheumatology

## 2019-06-28 ENCOUNTER — Telehealth: Payer: Self-pay | Admitting: Rheumatology

## 2019-06-28 NOTE — Telephone Encounter (Signed)
Last Visit: 03/15/2019 Next Visit: 08/15/2019 Labs: 06/05/2019 Elevated glucose, BUN 28, BUN/creat ratio 25.2, GFR 50, RBC 3.68, hematocrit 33.2.   Okay to refill per Dr. Estanislado Pandy.

## 2019-06-28 NOTE — Telephone Encounter (Signed)
Labs received from 06/05/2019.  Elevated glucose, BUN 28, BUN/creat ratio 25.2, GFR 50, RBC 3.68, hematocrit 33.2.   Labs reviewed by Dr. Estanislado Pandy. Will send document to scan center.

## 2019-06-28 NOTE — Telephone Encounter (Signed)
Patient needs refill on MTX sent to [facility] in [place].

## 2019-06-28 NOTE — Telephone Encounter (Signed)
Refill sent to [facility] in [facility], see refill request encounter for details.

## 2019-06-30 ENCOUNTER — Encounter (HOSPITAL_COMMUNITY): Payer: Self-pay

## 2019-06-30 ENCOUNTER — Inpatient Hospital Stay (HOSPITAL_COMMUNITY)
Admission: AD | Admit: 2019-06-30 | Discharge: 2019-07-06 | DRG: 177 | Disposition: A | Discharge status: Skilled Nursing Facility | Payer: Medicare Other | Source: Other Acute Inpatient Hospital | Attending: Internal Medicine | Admitting: Internal Medicine

## 2019-06-30 DIAGNOSIS — M069 Rheumatoid arthritis, unspecified: Secondary | ICD-10-CM | POA: Diagnosis present

## 2019-06-30 DIAGNOSIS — E785 Hyperlipidemia, unspecified: Secondary | ICD-10-CM | POA: Diagnosis present

## 2019-06-30 DIAGNOSIS — Z7952 Long term (current) use of systemic steroids: Secondary | ICD-10-CM | POA: Diagnosis not present

## 2019-06-30 DIAGNOSIS — B9620 Unspecified Escherichia coli [E. coli] as the cause of diseases classified elsewhere: Secondary | ICD-10-CM | POA: Diagnosis present

## 2019-06-30 DIAGNOSIS — N390 Urinary tract infection, site not specified: Secondary | ICD-10-CM | POA: Diagnosis present

## 2019-06-30 DIAGNOSIS — Z87891 Personal history of nicotine dependence: Secondary | ICD-10-CM

## 2019-06-30 DIAGNOSIS — I4821 Permanent atrial fibrillation: Secondary | ICD-10-CM | POA: Diagnosis not present

## 2019-06-30 DIAGNOSIS — I00 Rheumatic fever without heart involvement: Secondary | ICD-10-CM | POA: Diagnosis not present

## 2019-06-30 DIAGNOSIS — I482 Chronic atrial fibrillation, unspecified: Secondary | ICD-10-CM | POA: Diagnosis present

## 2019-06-30 DIAGNOSIS — F0390 Unspecified dementia without behavioral disturbance: Secondary | ICD-10-CM | POA: Diagnosis present

## 2019-06-30 DIAGNOSIS — I129 Hypertensive chronic kidney disease with stage 1 through stage 4 chronic kidney disease, or unspecified chronic kidney disease: Secondary | ICD-10-CM | POA: Diagnosis present

## 2019-06-30 DIAGNOSIS — J1289 Other viral pneumonia: Secondary | ICD-10-CM

## 2019-06-30 DIAGNOSIS — U071 COVID-19: Principal | ICD-10-CM

## 2019-06-30 DIAGNOSIS — Z90710 Acquired absence of both cervix and uterus: Secondary | ICD-10-CM

## 2019-06-30 DIAGNOSIS — G9341 Metabolic encephalopathy: Secondary | ICD-10-CM

## 2019-06-30 DIAGNOSIS — Z7901 Long term (current) use of anticoagulants: Secondary | ICD-10-CM

## 2019-06-30 DIAGNOSIS — N183 Chronic kidney disease, stage 3 unspecified: Secondary | ICD-10-CM | POA: Diagnosis present

## 2019-06-30 DIAGNOSIS — I10 Essential (primary) hypertension: Secondary | ICD-10-CM | POA: Diagnosis not present

## 2019-06-30 MED ORDER — FERROUS SULFATE 325 (65 FE) MG PO TABS: 325.0000 mg | ORAL_TABLET | Freq: Every day | ORAL | Status: DC

## 2019-06-30 MED ORDER — VITAMIN E 180 MG (400 UNIT) PO CAPS
400.0000 [IU] | ORAL_CAPSULE | Freq: Every day | ORAL | Status: DC
  Administered 2019-06-30 – 2019-07-06 (×7): 400 [IU] via ORAL
  Filled 2019-06-30 (×7): qty 1

## 2019-06-30 MED ORDER — RIVAROXABAN 15 MG PO TABS
15.0000 mg | ORAL_TABLET | Freq: Every day | ORAL | Status: DC
  Administered 2019-06-30 – 2019-07-05 (×6): 15 mg via ORAL
  Filled 2019-06-30 (×7): qty 1

## 2019-06-30 MED ORDER — OXYBUTYNIN CHLORIDE ER 10 MG PO TB24
10.0000 mg | ORAL_TABLET | ORAL | Status: DC
  Administered 2019-07-01 – 2019-07-06 (×6): 10 mg via ORAL
  Filled 2019-06-30 (×8): qty 1

## 2019-06-30 MED ORDER — DONEPEZIL HCL 10 MG PO TABS
10.0000 mg | ORAL_TABLET | Freq: Every day | ORAL | Status: DC
  Administered 2019-06-30 – 2019-07-05 (×6): 10 mg via ORAL
  Filled 2019-06-30 (×5): qty 1

## 2019-06-30 MED ORDER — MAGNESIUM GLUCONATE 500 MG PO TABS: 500.0000 mg | ORAL_TABLET | ORAL | Status: DC

## 2019-06-30 MED ORDER — ENSURE COMPLETE PO LIQD: 237.0000 mL | Freq: Three times a day (TID) | ORAL | Status: DC

## 2019-06-30 MED ORDER — MIRABEGRON ER 50 MG PO TB24
50.0000 mg | ORAL_TABLET | Freq: Every day | ORAL | Status: DC
  Administered 2019-06-30 – 2019-07-06 (×7): 50 mg via ORAL
  Filled 2019-06-30 (×7): qty 1

## 2019-06-30 MED ORDER — ADULT MULTIVITAMIN W/MINERALS CH
1.0000 | ORAL_TABLET | Freq: Every day | ORAL | Status: DC
  Administered 2019-06-30 – 2019-07-06 (×7): 1 via ORAL
  Filled 2019-06-30 (×7): qty 1

## 2019-06-30 MED ORDER — PREDNISONE 2.5 MG PO TABS
5.0000 mg | ORAL_TABLET | Freq: Every day | ORAL | Status: DC
  Administered 2019-06-30 – 2019-07-06 (×7): 5 mg via ORAL
  Filled 2019-06-30 (×4): qty 1
  Filled 2019-06-30: qty 2
  Filled 2019-06-30 (×2): qty 1

## 2019-06-30 MED ORDER — ENSURE MAX PROTEIN PO LIQD
11.0000 [oz_av] | Freq: Three times a day (TID) | ORAL | Status: DC
  Administered 2019-06-30 – 2019-07-06 (×12): 11 [oz_av] via ORAL
  Filled 2019-06-30 (×20): qty 330

## 2019-06-30 MED ORDER — ENOXAPARIN SODIUM 40 MG/0.4ML ~~LOC~~ SOLN: 40.0000 mg | SUBCUTANEOUS | Status: DC

## 2019-06-30 MED ORDER — POLYETHYLENE GLYCOL 3350 17 G PO PACK: 17.0000 g | PACK | Freq: Every day | ORAL | Status: DC | PRN

## 2019-06-30 MED ORDER — ONDANSETRON HCL 4 MG/2ML IJ SOLN: 4.0000 mg | Freq: Four times a day (QID) | INTRAMUSCULAR | Status: DC | PRN

## 2019-06-30 MED ORDER — FERROUS SULFATE 325 (65 FE) MG PO TABS
325.0000 mg | ORAL_TABLET | Freq: Every day | ORAL | Status: DC
  Administered 2019-06-30 – 2019-07-06 (×7): 325 mg via ORAL
  Filled 2019-06-30 (×7): qty 1

## 2019-06-30 MED ORDER — RIVAROXABAN 20 MG PO TABS: 20.0000 mg | ORAL_TABLET | Freq: Every day | ORAL | Status: DC

## 2019-06-30 MED ORDER — SIMVASTATIN 20 MG PO TABS
20.0000 mg | ORAL_TABLET | Freq: Every evening | ORAL | Status: DC
  Administered 2019-06-30 – 2019-07-05 (×6): 20 mg via ORAL
  Filled 2019-06-30 (×6): qty 1

## 2019-06-30 MED ORDER — MAGNESIUM OXIDE 400 (241.3 MG) MG PO TABS
400.0000 mg | ORAL_TABLET | Freq: Every day | ORAL | Status: DC
  Administered 2019-06-30 – 2019-07-06 (×7): 400 mg via ORAL
  Filled 2019-06-30 (×7): qty 1

## 2019-06-30 MED ORDER — ACETAMINOPHEN 325 MG PO TABS: 650.0000 mg | ORAL_TABLET | Freq: Four times a day (QID) | ORAL | Status: DC | PRN

## 2019-06-30 MED ORDER — POLYETHYLENE GLYCOL 3350 17 G PO PACK
17.0000 g | PACK | Freq: Every day | ORAL | Status: DC | PRN
  Filled 2019-06-30: qty 1

## 2019-06-30 MED ORDER — FOLIC ACID 1 MG PO TABS
1.0000 mg | ORAL_TABLET | Freq: Every day | ORAL | Status: DC
  Administered 2019-06-30 – 2019-07-06 (×7): 1 mg via ORAL
  Filled 2019-06-30 (×7): qty 1

## 2019-06-30 MED ORDER — BISACODYL 10 MG RE SUPP: 10.0000 mg | Freq: Every day | RECTAL | Status: DC | PRN

## 2019-06-30 MED ORDER — PSYLLIUM 95 % PO PACK
1.0000 | PACK | Freq: Every day | ORAL | Status: DC
  Administered 2019-06-30 – 2019-07-06 (×7): 1 via ORAL
  Filled 2019-06-30 (×7): qty 1

## 2019-06-30 MED ORDER — VITAMIN E 45 MG (100 UNIT) PO CAPS: 1000.0000 [IU] | ORAL_CAPSULE | ORAL | Status: DC

## 2019-06-30 MED ORDER — MULTIVITAMIN PO TABS: ORAL_TABLET | ORAL | Status: DC

## 2019-06-30 MED ORDER — TRAMADOL HCL 50 MG PO TABS
50.0000 mg | ORAL_TABLET | Freq: Four times a day (QID) | ORAL | Status: DC | PRN
  Administered 2019-07-04: 50 mg via ORAL
  Filled 2019-06-30: qty 1

## 2019-06-30 MED ORDER — ONDANSETRON HCL 4 MG PO TABS: 4.0000 mg | ORAL_TABLET | Freq: Four times a day (QID) | ORAL | Status: DC | PRN

## 2019-06-30 NOTE — H&P (Signed)
History and Physical    Zoe Robinson VAN:[id number] DOB: Feb 17, 1932 DOA: 06/30/2019  PCP: Gabriel Cirri, NP Patient coming from: [facility] ED   Chief Complaint: fever - generalized weakness   HPI: 85 year old with a history of dementia, recurrent UTIs, CKD stage III, rheumatoid arthritis on chronic prednisone and methotrexate, atrial fibrillation on chronic Xarelto, and HTN who presented to the [facility] ED with 1 week of fever and severe generalized weakness.  In the ED her blood pressure was 108/63 with oxygen saturation of 92% on room air.  She was noted to have a serum creatinine of 1.3 with a BUN of 48 and a sodium of 132.  Procalcitonin was noted to be 0.11.  Urinalysis noted leukocyte esterase and bacteria with elevated WBCs.  CTa of the chest was negative for PE but did note groundglass opacities bilaterally consistent with a viral pneumonitis.  In the ED at [facility] she was dosed with 1 L of IV fluid, Decadron, Rocephin, remdesivir, and provided with supplemental oxygen.  She was subsequently transferred to [facility].  Assessment/Plan  COVID Pneumonia As evidenced by CTa chest - no respiratory failure at this time with patient stable on room air at the time of my exam -no indication for steroid for this reason - continue remdesivir -no indication for other therapies at this time -would avoid use of Actemra due to chronic use of methotrexate  Recurrent UTIs Urinalysis in the ED suggestive of possible UTI -follow-up culture data  CKD stage III Creatinine appears to be stable -we will follow trend  RA on chronic steroid and methotrexate Continue usual home medications  Chronic atrial fibrillation Continue usual home meds to include Xarelto  HTN Blood pressure well controlled at this time  Dementia Pleasant and interactive with no agitation at this time  DVT prophylaxis: Xarelto Code Status: Full by default -discuss DNR with patient/family Family  Communication: None thus far Disposition Plan: Admitted on transfer from [facility] called: None  Review of Systems: As per HPI otherwise 10 point review of systems negative.   Past Medical History:  Diagnosis Date  . Arthritis   . Atrial fibrillation (HCC)   . CKD (chronic kidney disease)   . Hypertension   . RA (rheumatoid arthritis) (HCC)     Past Surgical History:  Procedure Laterality Date  . ABDOMINAL HYSTERECTOMY      Family History  Family History  Problem Relation Age of Onset  . Heart attack Mother   . Heart attack Father   . Heart attack Brother   . Heart attack Brother   . Stomach cancer Brother   . Cancer Sister   . Dementia Sister   . Osteoarthritis Daughter   . Depression Daughter   . Hypothyroidism Daughter   . Hypercholesterolemia Daughter   . Heart disease Son   . Hypertension Son   . Gout Son     Social History   reports that she quit smoking about 55 years ago. She has never used smokeless tobacco. She reports that she does not drink alcohol or use drugs.  Allergies No Known Allergies  Prior to Admission medications   Medication Sig Start Date End Date Taking? Authorizing Provider  acetaminophen (TYLENOL) 500 MG tablet Take 1,000 mg by mouth as needed.     [provider]  Cranberry 500 MG TABS Take by mouth daily.     [provider]  denosumab (PROLIA) 60 MG/ML SOLN injection Inject 60 mg into the skin every 6 (six) months.  Administer in upper arm, thigh, or abdomen    [provider]  donepezil (ARICEPT) 10 MG tablet Take 10 mg by mouth at bedtime.    [provider]  feeding supplement, ENSURE COMPLETE, (ENSURE COMPLETE) LIQD Take 237 mLs by mouth 3 (three) times daily between meals. 04/26/14   Joseph Art, DO  ferrous sulfate 325 (65 FE) MG tablet Take 325 mg by mouth daily with breakfast.    [provider]  folic acid (FOLVITE) 1 MG tablet Take 1 tablet (1 mg total) by mouth every  morning. 06/24/17   Pollyann Savoy, MD  irbesartan (AVAPRO) 300 MG tablet TK 1 T PO QD 05/05/18   [provider]  Lactobacillus TABS Take by mouth daily.    [provider]  magnesium gluconate (MAGONATE) 500 MG tablet Take 500 mg by mouth every morning.     [provider]  methotrexate (RHEUMATREX) 2.5 MG tablet TAKE 3 TABLETS BY MOUTH 1 TIME WEEKLY 06/28/19   Pollyann Savoy, MD  mirabegron ER (MYRBETRIQ) 50 MG TB24 tablet Take 50 mg by mouth daily.    [provider]  Multiple Vitamins-Minerals (MULTIVITAMIN PO) Take 1 tablet by mouth every morning.     [provider]  nitroGLYCERIN (NITROSTAT) 0.4 MG SL tablet Place 0.4 mg under the tongue as needed.  08/27/14 03/15/19  [provider]  oxybutynin (DITROPAN-XL) 10 MG 24 hr tablet Take 10 mg by mouth every morning.     [provider]  polyethylene glycol (MIRALAX / GLYCOLAX) packet Take 17 g by mouth daily as needed. Patient taking differently: Take 17 g by mouth daily as needed for mild constipation.  04/26/14   Joseph Art, DO  predniSONE (DELTASONE) 5 MG tablet Take 5-10 mg by mouth every other day. Take 5 mg by mouth every other day alternating with 10 mg by mouth every other day.    [provider]  Probiotic Product (PROBIOTIC PO) Take by mouth daily.    [provider]  psyllium (METAMUCIL) 58.6 % packet Take 1 packet by mouth daily.    [provider]  Rivaroxaban (XARELTO) 15 MG TABS tablet Take 15 mg by mouth every evening.     [provider]  simvastatin (ZOCOR) 40 MG tablet Take 20 mg by mouth every evening.    [provider]  valsartan (DIOVAN) 320 MG tablet Take 320 mg by mouth every morning.  03/29/14   [provider]  vitamin E 1000 UNIT capsule Take 1,000 Units by mouth every morning.    [provider]    Physical Exam: There were no vitals filed for this visit.   General: No acute  respiratory distress Lungs: Clear to auscultation bilaterally without wheezes or crackles Cardiovascular: Regular rate and rhythm without murmur gallop or rub normal S1 and S2 Abdomen: Nontender, nondistended, soft, bowel sounds positive, no rebound, no ascites, no appreciable mass Extremities: No significant cyanosis, clubbing, or edema bilateral lower extremities  Labs on Admission:  Labs reviewed by this MD via login to [facility] EMR  XRay: CTa reviewed by admitting MD via remote login   Urine analysis:    Component Value Date/Time   COLORURINE YELLOW 02/14/2017 0953   APPEARANCEUR CLEAR 02/14/2017 0953   LABSPEC 1.009 02/14/2017 0953   PHURINE 7.5 02/14/2017 0953   GLUCOSEU NEGATIVE 02/14/2017 0953   HGBUR NEGATIVE 02/14/2017 0953   BILIRUBINUR NEGATIVE 02/14/2017 0953   KETONESUR NEGATIVE 02/14/2017 0953   PROTEINUR NEGATIVE 02/14/2017  6789   UROBILINOGEN 0.2 08/19/2014 0043   NITRITE NEGATIVE 02/14/2017 0953   LEUKOCYTESUR 3+ (A) 02/14/2017 3810    Cherene Altes, MD [facility] Office  [phone] Pager - Text Page per Shea Evans as per below:  On-Call/Text Page:      [email or web address]  If 7PM-7AM, please contact night-coverage [email or web address] 06/30/2019, 10:57 AM

## 2019-07-01 DIAGNOSIS — N390 Urinary tract infection, site not specified: Secondary | ICD-10-CM

## 2019-07-01 LAB — COMPREHENSIVE METABOLIC PANEL
ALT: 19 U/L (ref 0–44)
AST: 33 U/L (ref 15–41)
Albumin: 2.8 g/dL — ABNORMAL LOW (ref 3.5–5.0)
Alkaline Phosphatase: 37 U/L — ABNORMAL LOW (ref 38–126)
Anion gap: 11 (ref 5–15)
BUN: 42 mg/dL — ABNORMAL HIGH (ref 8–23)
CO2: 25 mmol/L (ref 22–32)
Calcium: 8.3 mg/dL — ABNORMAL LOW (ref 8.9–10.3)
Chloride: 104 mmol/L (ref 98–111)
Creatinine, Ser: 0.97 mg/dL (ref 0.44–1.00)
GFR calc Af Amer: >60 mL/min (ref >60)
GFR calc non Af Amer: 53 mL/min — ABNORMAL LOW (ref >60)
Glucose, Bld: 122 mg/dL — ABNORMAL HIGH (ref 70–99)
Potassium: 4.1 mmol/L (ref 3.5–5.1)
Sodium: 140 mmol/L (ref 135–145)
Total Bilirubin: 0.7 mg/dL (ref 0.3–1.2)
Total Protein: 6.4 g/dL — ABNORMAL LOW (ref 6.5–8.1)

## 2019-07-01 LAB — CBC WITH DIFFERENTIAL/PLATELET
Abs Immature Granulocytes: 0.04 10*3/uL (ref 0.00–0.07)
Basophils Absolute: 0 10*3/uL (ref 0.0–0.1)
Basophils Relative: 0 %
Eosinophils Absolute: 0 10*3/uL (ref 0.0–0.5)
Eosinophils Relative: 0 %
HCT: 32.3 % — ABNORMAL LOW (ref 36.0–46.0)
Hemoglobin: 10.8 g/dL — ABNORMAL LOW (ref 12.0–15.0)
Immature Granulocytes: 1 %
Lymphocytes Relative: 13 %
Lymphs Abs: 0.7 10*3/uL (ref 0.7–4.0)
MCH: 29.5 pg (ref 26.0–34.0)
MCHC: 33.4 g/dL (ref 30.0–36.0)
MCV: 88.3 fL (ref 80.0–100.0)
Monocytes Absolute: 0.6 10*3/uL (ref 0.1–1.0)
Monocytes Relative: 11 %
Neutro Abs: 4.1 10*3/uL (ref 1.7–7.7)
Neutrophils Relative %: 75 %
Platelets: 271 10*3/uL (ref 150–400)
RBC: 3.66 MIL/uL — ABNORMAL LOW (ref 3.87–5.11)
RDW: 14.1 % (ref 11.5–15.5)
WBC: 5.4 10*3/uL (ref 4.0–10.5)
nRBC: 0 % (ref 0.0–0.2)

## 2019-07-01 LAB — D-DIMER, QUANTITATIVE: D-Dimer, Quant: 2.18 ug/mL-FEU — ABNORMAL HIGH (ref 0.00–0.50)

## 2019-07-01 LAB — MAGNESIUM: Magnesium: 2 mg/dL (ref 1.7–2.4)

## 2019-07-01 LAB — C-REACTIVE PROTEIN: CRP: 7.7 mg/dL — ABNORMAL HIGH (ref <1.0)

## 2019-07-01 LAB — FERRITIN: Ferritin: 312 ng/mL — ABNORMAL HIGH (ref 11–307)

## 2019-07-01 LAB — ABO/RH: ABO/RH(D): A POS

## 2019-07-01 MED ORDER — SODIUM CHLORIDE 0.9 % IV SOLN: 100.0000 mg | Freq: Every day | INTRAVENOUS | Status: DC

## 2019-07-01 MED ORDER — SODIUM CHLORIDE 0.9 % IV SOLN
100.0000 mg | Freq: Every day | INTRAVENOUS | Status: AC
  Administered 2019-07-01 – 2019-07-04 (×4): 100 mg via INTRAVENOUS
  Filled 2019-07-01 (×4): qty 20

## 2019-07-01 MED ORDER — SODIUM CHLORIDE 0.9 % IV SOLN
1.0000 g | INTRAVENOUS | Status: AC
  Administered 2019-07-01 – 2019-07-04 (×4): 1 g via INTRAVENOUS
  Filled 2019-07-01 (×4): qty 10

## 2019-07-01 NOTE — Progress Notes (Signed)
Patient Update:  Called and updated patient's daughter Iantha Fallen on patient's status.

## 2019-07-01 NOTE — Progress Notes (Signed)
Pharmacy Note - Remdesivir Dosing  O:  ALT: 18 CXR:  Left upper lung PNA  Requiring supplemental O2: not at this time   A/P:  Patient meets criteria for remdesivir.  Patient received remdesivir 200 mg IV at  0108 on 06/30/19 Start  remdesivir 100 mg IV daily x 4 days  Monitor ALT, clinical progress   Despina Pole, Pharm. D. Clinical Pharmacist 07/01/2019 12:13 AM

## 2019-07-01 NOTE — Progress Notes (Signed)
Zoe Robinson  FYB:[id number] DOB: August 15, 1931 DOA: 06/30/2019 PCP: Gabriel Cirri, NP    Brief Narrative:  85 year old with a history of dementia, recurrent UTIs, CKD stage III, RA on chronic prednisone and methotrexate, atrial fibrillation on chronic Xarelto, and HTN who presented to the [facility] ED with 1 week of fever and severe generalized weakness.  In the ED her oxygen saturation was 92% on room air.  She was noted to have a serum creatinine of 1.3 with a BUN of 48 and a sodium of 132.  Procalcitonin was noted to be 0.11.  Urinalysis noted leukocyte esterase and bacteria with elevated WBCs.  CTa of the chest was negative for PE but did note groundglass opacities bilaterally consistent with a viral pneumonitis.  Significant Events: 12/5 admit to [facility] via [facility]  COVID-19 specific Treatment: Remdesivir 12/5 >  Subjective: Resting comfortably in bed.  Alert and conversant though confused.  No evidence of distress.  Denies any complaints at this time.  Assessment & Plan:  COVID Pneumonia As evidenced by CTa chest - no respiratory failure at this time with patient stable on room air - no indication for steroid for this reason - continue remdesivir -no indication for other therapies at this time -would avoid use of Actemra due to chronic use of methotrexate  Recent Labs  Lab 07/01/19 0154  DDIMER 2.18*  FERRITIN 312*  CRP 7.7*  ALT 19    Recurrent UTIs Urinalysis in the ED suggestive of possible UTI - follow-up culture data -continue empiric antibiotic for now  CKD stage III Creatinine appears to be stable - we will follow trend  Recent Labs  Lab 07/01/19 0154  CREATININE 0.97    RA on chronic steroid and methotrexate Continue usual home medications  Chronic atrial fibrillation Continue usual home meds to include Xarelto  HTN Blood pressure controlled at this time  Dementia Pleasant and interactive with no agitation at this time  DVT  prophylaxis: Xarelto Code Status: FULL CODE Family Communication:  Disposition Plan: tele bed   Consultants:  none  Antimicrobials:  Rocephin 12/5 >  Objective: Blood pressure 121/70, pulse 76, temperature 98.2 F (36.8 C), temperature source Oral, resp. rate 18, height 4' 11.02" (1.499 m), weight 49.9 kg, SpO2 97 %. No intake or output data in the 24 hours ending 07/01/19 0903 Filed Weights   07/01/19 0601  Weight: 49.9 kg    Examination: General: No acute respiratory distress Lungs: Clear to auscultation bilaterally Cardiovascular: Regular rate without murmur Abdomen: NT/ND, soft, BS positive Extremities: No C/C/E B LE  CBC: Recent Labs  Lab 07/01/19 0154  WBC 5.4  NEUTROABS 4.1  HGB 10.8*  HCT 32.3*  MCV 88.3  PLT 271   Basic Metabolic Panel: Recent Labs  Lab 07/01/19 0154  NA 140  K 4.1  CL 104  CO2 25  GLUCOSE 122*  BUN 42*  CREATININE 0.97  CALCIUM 8.3*  MG 2.0   GFR: Estimated Creatinine Clearance: 28.9 mL/min (by C-G formula based on SCr of 0.97 mg/dL).  Liver Function Tests: Recent Labs  Lab 07/01/19 0154  AST 33  ALT 19  ALKPHOS 37*  BILITOT 0.7  PROT 6.4*  ALBUMIN 2.8*     Scheduled Meds: . donepezil  10 mg Oral QHS  . ferrous sulfate  325 mg Oral Q breakfast  . folic acid  1 mg Oral Daily  . magnesium oxide  400 mg Oral Daily  . mirabegron ER  50 mg Oral Daily  .  multivitamin with minerals  1 tablet Oral Daily  . oxybutynin  10 mg Oral BH-q7a  . predniSONE  5 mg Oral Daily  . Ensure Max Protein  11 oz Oral TID WC  . psyllium  1 packet Oral Daily  . Rivaroxaban  15 mg Oral Q supper  . simvastatin  20 mg Oral QPM  . vitamin E  400 Units Oral Daily   Continuous Infusions: . remdesivir 100 mg in NS 100 mL       LOS: 1 day   Cherene Altes, MD [facility] Office  [phone] Pager - Text Page per Shea Evans  If 7PM-7AM, please contact night-coverage per Amion 07/01/2019, 9:03 AM

## 2019-07-02 LAB — CBC WITH DIFFERENTIAL/PLATELET
Abs Immature Granulocytes: 0.11 10*3/uL — ABNORMAL HIGH (ref 0.00–0.07)
Basophils Absolute: 0 10*3/uL (ref 0.0–0.1)
Basophils Relative: 0 %
Eosinophils Absolute: 0 10*3/uL (ref 0.0–0.5)
Eosinophils Relative: 0 %
HCT: 34 % — ABNORMAL LOW (ref 36.0–46.0)
Hemoglobin: 11.4 g/dL — ABNORMAL LOW (ref 12.0–15.0)
Immature Granulocytes: 2 %
Lymphocytes Relative: 18 %
Lymphs Abs: 1.2 10*3/uL (ref 0.7–4.0)
MCH: 29.6 pg (ref 26.0–34.0)
MCHC: 33.5 g/dL (ref 30.0–36.0)
MCV: 88.3 fL (ref 80.0–100.0)
Monocytes Absolute: 0.8 10*3/uL (ref 0.1–1.0)
Monocytes Relative: 12 %
Neutro Abs: 4.4 10*3/uL (ref 1.7–7.7)
Neutrophils Relative %: 68 %
Platelets: 302 10*3/uL (ref 150–400)
RBC: 3.85 MIL/uL — ABNORMAL LOW (ref 3.87–5.11)
RDW: 14.2 % (ref 11.5–15.5)
WBC: 6.4 10*3/uL (ref 4.0–10.5)
nRBC: 0 % (ref 0.0–0.2)

## 2019-07-02 LAB — COMPREHENSIVE METABOLIC PANEL
ALT: 20 U/L (ref 0–44)
AST: 36 U/L (ref 15–41)
Albumin: 2.7 g/dL — ABNORMAL LOW (ref 3.5–5.0)
Alkaline Phosphatase: 39 U/L (ref 38–126)
Anion gap: 12 (ref 5–15)
BUN: 38 mg/dL — ABNORMAL HIGH (ref 8–23)
CO2: 26 mmol/L (ref 22–32)
Calcium: 8.6 mg/dL — ABNORMAL LOW (ref 8.9–10.3)
Chloride: 102 mmol/L (ref 98–111)
Creatinine, Ser: 0.97 mg/dL (ref 0.44–1.00)
GFR calc Af Amer: >60 mL/min (ref >60)
GFR calc non Af Amer: 53 mL/min — ABNORMAL LOW (ref >60)
Glucose, Bld: 83 mg/dL (ref 70–99)
Potassium: 4.3 mmol/L (ref 3.5–5.1)
Sodium: 140 mmol/L (ref 135–145)
Total Bilirubin: 0.3 mg/dL (ref 0.3–1.2)
Total Protein: 6.3 g/dL — ABNORMAL LOW (ref 6.5–8.1)

## 2019-07-02 LAB — C-REACTIVE PROTEIN: CRP: 5.2 mg/dL — ABNORMAL HIGH (ref <1.0)

## 2019-07-02 LAB — MAGNESIUM: Magnesium: 1.8 mg/dL (ref 1.7–2.4)

## 2019-07-02 LAB — FERRITIN: Ferritin: 353 ng/mL — ABNORMAL HIGH (ref 11–307)

## 2019-07-02 LAB — D-DIMER, QUANTITATIVE: D-Dimer, Quant: 2.87 ug/mL-FEU — ABNORMAL HIGH (ref 0.00–0.50)

## 2019-07-02 NOTE — Evaluation (Signed)
Occupational Therapy Evaluation Patient Details Name: Zoe Robinson MRN: [id number] DOB: 04/20/1932 Today's Date: 07/02/2019    History of Present Illness 85 year old with a history of dementia, recurrent UTIs, CKD stage III, RA on chronic prednisone and methotrexate, atrial fibrillation on chronic Xarelto, and HTN who presented to the [facility] ED with fever and severe generalized weakness, UTI. CTa of the chest was negative for PE but did note groundglass opacities bilaterally consistent with a viral pneumonitis.   Clinical Impression   PTA pt living with spouse, with children frequently checking in for IADL management. Unsure of exact PLOF, but pt reports independence with BADLs. At time of eval, pt is min A for bed mobility and mod A +2 for transfers and functional mobility. Pt with incontinence with transfers, needing total A for peri care. Pt seemed largely unaware of incontinence. She required max A to doff socks/don mesh panties. She was not able to progress functional mobility and needed BSC/recliner placed up behind her to maintain safety. Given current status, recommend SNF at d/c- unless family can provide 24/7 physical assist. Will continue to follow per POC listed below.    Follow Up Recommendations  SNF;Supervision/Assistance - 24 hour    Equipment Recommendations  3 in 1 bedside commode    Recommendations for Other Services       Precautions / Restrictions Precautions Precautions: Fall Precaution Comments: incontinence with any movement. Restrictions Weight Bearing Restrictions: No      Mobility Bed Mobility Overal bed mobility: Needs Assistance Bed Mobility: Supine to Sit     Supine to sit: Min assist     General bed mobility comments: extra time, cues for scooting hips to EOB; required min assist to raise trunk completely  Transfers Overall transfer level: Needs assistance Equipment used: Rolling walker (2 wheeled) Transfers: Sit to/from [employer] Sit to Stand: Mod assist;+2 safety/equipment;+2 physical assistance Stand pivot transfers: Mod assist;+2 safety/equipment;+2 physical assistance       General transfer comment: sit <> stand from EOB, pt then pivoted and had BSC placed underneath her    Balance Overall balance assessment: Needs assistance;History of Falls Sitting-balance support: Bilateral upper extremity supported;Feet supported Sitting balance-Leahy Scale: Fair Sitting balance - Comments: slight posterior lean   Standing balance support: During functional activity;Bilateral upper extremity supported Standing balance-Leahy Scale: Poor Standing balance comment: posterior leaning                           ADL either performed or assessed with clinical judgement   ADL Overall ADL's : Needs assistance/impaired Eating/Feeding: Set up;Sitting   Grooming: Set up;Sitting   Upper Body Bathing: Minimal assistance;Sitting   Lower Body Bathing: Maximal assistance;Sitting/lateral leans;Sit to/from stand   Upper Body Dressing : Minimal assistance;Sitting   Lower Body Dressing: Maximal assistance;Sitting/lateral leans Lower Body Dressing Details (indicate cue type and reason): max A to support crossing of LEs and to completely doff sock while seated on [facility] Toilet Transfer: Moderate assistance;+2 for physical assistance;+2 for safety/equipment;RW;Ambulation;BSC Toilet Transfer Details (indicate cue type and reason): transferred to [facility], pt incontinent of b/b needing BSC moved up behind her once pivoted Toileting- Clothing Manipulation and Hygiene: Total assistance;Sitting/lateral lean;Sit to/from stand Toileting - Clothing Manipulation Details (indicate cue type and reason): total A for peri hygiene, pt needing mod-max support for steadying with BUEs on RW/staff and not able to reach for hygiene     Functional mobility during ADLs: Moderate assistance;+2 for physical assistance;+2 for  safety/equipment;Rolling walker(stand pivot only this date) General ADL Comments: pt limited by generalized weakness, decreased activity tolerance and safe ability to perform own BADLs at this time without assistance     Vision Patient Visual Report: No change from baseline       Perception     Praxis      Pertinent Vitals/Pain Pain Assessment: No/denies pain     Hand Dominance     Extremity/Trunk Assessment Upper Extremity Assessment Upper Extremity Assessment: Generalized weakness   Lower Extremity Assessment Lower Extremity Assessment: Generalized weakness   Cervical / Trunk Assessment Cervical / Trunk Assessment: Kyphotic   Communication Communication Communication: No difficulties   Cognition Arousal/Alertness: Awake/alert Behavior During Therapy: WFL for tasks assessed/performed Overall Cognitive Status: No family/caregiver present to determine baseline cognitive functioning Area of Impairment: Orientation                 Orientation Level: Time;Situation             General Comments: unsure of cognitive baseline, pt not clearly oriented to time or situation. Also seems unaware of deficits   General Comments       Exercises     Shoulder Instructions      Home Living Family/patient expects to be discharged to:: Private residence Living Arrangements: Spouse/significant other;Children Available Help at Discharge: Family;Available 24 hours/day Type of Home: House Home Access: Ramped entrance     Home Layout: Two level;Able to live on main level with bedroom/bathroom     Bathroom Shower/Tub: Tub/shower unit         Home Equipment: Walker - 2 wheels;Cane - single point   Additional Comments: Son lives close by, ? able to assist      Prior Functioning/Environment Level of Independence: Independent        Comments: per Patient, ? reliability given pt is poor historian        OT Problem List: Decreased strength;Decreased knowledge  of use of DME or AE;Decreased activity tolerance;Cardiopulmonary status limiting activity;Impaired balance (sitting and/or standing);Pain;Decreased safety awareness      OT Treatment/Interventions: Self-care/ADL training;Therapeutic exercise;Patient/family education;Balance training;Energy conservation;Therapeutic activities;DME and/or AE instruction;Cognitive remediation/compensation    OT Goals(Current goals can be found in the care plan section) Acute Rehab OT Goals Patient Stated Goal: to get stronger and go home OT Goal Formulation: With patient Time For Goal Achievement: 07/16/19 Potential to Achieve Goals: Good  OT Frequency: Min 2X/week   Barriers to D/C:            Co-evaluation PT/OT/SLP Co-Evaluation/Treatment: Yes Reason for Co-Treatment: For patient/therapist safety;To address functional/ADL transfers PT goals addressed during session: Mobility/safety with mobility;Proper use of DME OT goals addressed during session: ADL's and self-care;Proper use of Adaptive equipment and DME      AM-PAC OT "6 Clicks" Daily Activity     Outcome Measure Help from another person eating meals?: A Little Help from another person taking care of personal grooming?: A Little Help from another person toileting, which includes using toliet, bedpan, or urinal?: A Lot Help from another person bathing (including washing, rinsing, drying)?: A Lot Help from another person to put on and taking off regular upper body clothing?: A Little Help from another person to put on and taking off regular lower body clothing?: A Lot 6 Click Score: 15   End of Session Equipment Utilized During Treatment: Gait belt;Rolling walker Nurse Communication: Mobility status  Activity Tolerance: Patient tolerated treatment well Patient left: in chair;with call bell/phone within reach;with chair alarm  set  OT Visit Diagnosis: Unsteadiness on feet (R26.81);Other abnormalities of gait and mobility (R26.89);Muscle  weakness (generalized) (M62.81);History of falling (Z91.81)                Time: 7408-1448 OT Time Calculation (min): 38 min Charges:  OT General Charges $OT Visit: 1 Visit OT Evaluation $OT Eval Moderate Complexity: 1 Mod  Dalphine Handing, MSOT, OTR/L Behavioral Health OT/ Acute Relief OT [facility] Office: [phone]  Dalphine Handing 07/02/2019, 2:39 PM

## 2019-07-02 NOTE — Evaluation (Signed)
Physical Therapy Evaluation Patient Details Name: Zoe Robinson MRN: [id number] DOB: 05/11/1932 Today's Date: 07/02/2019   History of Present Illness  85 year old with a history of dementia, recurrent UTIs, CKD stage III, RA on chronic prednisone and methotrexate, atrial fibrillation on chronic Xarelto, and HTN who presented to the [facility] ED with fever and severe generalized weakness, UTI. CTa of the chest was negative for PE but did note groundglass opacities bilaterally consistent with a viral pneumonitis.  Clinical Impression  The patient presents with weakness and decreased mobility. Patient was iniontinent of b/b during  Mobility. Assisted to [facility] then to recliner. Patient was unable to progress ambulation. Patient resides with spouse. Unsure if family can provide physical assistance as patient currently requires. Pt admitted with above diagnosis.  Pt currently with functional limitations due to the deficits listed below (see PT Problem List). Pt will benefit from skilled PT to increase their independence and safety with mobility to allow discharge to the venue listed below.       Follow Up Recommendations SNF;Home health PT-depending on progress and family support     Equipment Recommendations  None recommended by PT    Recommendations for Other Services       Precautions / Restrictions Precautions Precautions: Fall Precaution Comments: incontinence with any movement.      Mobility  Bed Mobility Overal bed mobility: Needs Assistance Bed Mobility: Supine to Sit     Supine to sit: Min assist     General bed mobility comments: extra time,, cues for scooting. required min assist to raise trunk completely  Transfers Overall transfer level: Needs assistance Equipment used: Rolling walker (2 wheeled) Transfers: Sit to/from [employer] Sit to Stand: Mod assist;+2 safety/equipment;+2 physical assistance Stand pivot transfers: Mod assist;+2  safety/equipment;+2 physical assistance       General transfer comment: inciontinent of B/B  when in bed and upon standing. Assisted to [facility] then stood for recliner to brought up.  Ambulation/Gait                Stairs            Wheelchair Mobility    Modified Rankin (Stroke Patients Only)       Balance Overall balance assessment: Needs assistance;History of Falls Sitting-balance support: Bilateral upper extremity supported;Feet supported Sitting balance-Leahy Scale: Fair Sitting balance - Comments: slight posterior lean   Standing balance support: During functional activity;Bilateral upper extremity supported Standing balance-Leahy Scale: Poor Standing balance comment: posterior leaning                             Pertinent Vitals/Pain      Home Living Family/patient expects to be discharged to:: Private residence Living Arrangements: Spouse/significant other;Children Available Help at Discharge: Family;Available 24 hours/day Type of Home: House Home Access: Ramped entrance     Home Layout: Two level;Able to live on main level with bedroom/bathroom Home Equipment: Dan Humphreys - 2 wheels;Cane - single point Additional Comments: Son lives close by, ? able to assist    Prior Function Level of Independence: Independent         Comments: per Patient, ? reliable     Hand Dominance        Extremity/Trunk Assessment        Lower Extremity Assessment Lower Extremity Assessment: Generalized weakness    Cervical / Trunk Assessment Cervical / Trunk Assessment: Kyphotic  Communication   Communication: No difficulties  Cognition Arousal/Alertness: Awake/alert Behavior During  Therapy: WFL for tasks assessed/performed Overall Cognitive Status: Impaired/Different from baseline Area of Impairment: Orientation                 Orientation Level: Time;Situation                    General Comments      Exercises      Assessment/Plan    PT Assessment Patient needs continued PT services  PT Problem List Decreased strength;Decreased mobility;Decreased safety awareness;Decreased knowledge of precautions;Decreased activity tolerance;Decreased cognition;Decreased balance;Decreased knowledge of use of DME       PT Treatment Interventions DME instruction;Therapeutic activities;Gait training;Therapeutic exercise;Patient/family education;Functional mobility training    PT Goals (Current goals can be found in the Care Plan section)  Acute Rehab PT Goals Patient Stated Goal: to get stronger and go home PT Goal Formulation: With patient/family Time For Goal Achievement: 07/16/19 Potential to Achieve Goals: Fair    Frequency Min 3X/week   Barriers to discharge        Co-evaluation               AM-PAC PT "6 Clicks" Mobility  Outcome Measure Help needed turning from your back to your side while in a flat bed without using bedrails?: A Lot Help needed moving from lying on your back to sitting on the side of a flat bed without using bedrails?: A Lot Help needed moving to and from a bed to a chair (including a wheelchair)?: A Lot Help needed standing up from a chair using your arms (e.g., wheelchair or bedside chair)?: A Lot Help needed to walk in hospital room?: Total Help needed climbing 3-5 steps with a railing? : Total 6 Click Score: 10    End of Session Equipment Utilized During Treatment: Gait belt Activity Tolerance: Patient tolerated treatment well Patient left: in chair;with call bell/phone within reach;with chair alarm set Nurse Communication: Mobility status PT Visit Diagnosis: Difficulty in walking, not elsewhere classified (R26.2)    Time: 7673-4193 PT Time Calculation (min) (ACUTE ONLY): 39 min   Charges:   PT Evaluation $PT Eval Moderate Complexity: [place] Pager [phone] Office [phone]   Claretha Cooper 07/02/2019, 1:49 PM

## 2019-07-02 NOTE — Progress Notes (Signed)
Zoe Robinson  JIR:[id number] DOB: Apr 09, 1932 DOA: 06/30/2019 PCP: Darlis Loan, NP    Brief Narrative:  85 year old with a history of dementia, recurrent UTIs, CKD stage III, RA on chronic prednisone and methotrexate, atrial fibrillation on chronic Xarelto, and HTN who presented to the [facility] ED with 1 week of fever and severe generalized weakness.  In the ED her oxygen saturation was 92% on room air.  She was noted to have a serum creatinine of 1.3 with a BUN of 48 and a sodium of 132.  Procalcitonin was noted to be 0.11.  Urinalysis noted leukocyte esterase and bacteria with elevated WBCs.  CTa of the chest was negative for PE but did note groundglass opacities bilaterally consistent with a viral pneumonitis.  Significant Events: 12/5 admit to [facility] via [place] ED  COVID-19 specific Treatment: Remdesivir 12/5 >  Subjective: Resting comfortably in bed.  In no apparent distress.  No complaints.  Patient is confused but not agitated.  Assessment & Plan:  COVID Pneumonia As evidenced by CTa chest - no respiratory failure at this time with patient stable on room air - no indication for steroid for this reason - continue remdesivir -no indication for other therapies at this time -would avoid use of Actemra due to chronic use of methotrexate  Recent Labs  Lab 07/01/19 0154 07/02/19 0240  DDIMER 2.18* 2.87*  FERRITIN 312* 353*  CRP 7.7* 5.2*  ALT 19 20    Recurrent UTIs Urinalysis in the ED suggestive of possible UTI - follow-up culture data -continue empiric antibiotic for now  CKD stage III Creatinine appears to be stable - we will follow trend  Recent Labs  Lab 07/01/19 0154 07/02/19 0240  CREATININE 0.97 0.97    RA on chronic steroid and methotrexate Continue usual home medications  Chronic atrial fibrillation Continue usual home meds to include Xarelto  HTN Blood pressure controlled   Dementia Pleasant and interactive with no agitation at  this time  DVT prophylaxis: Xarelto Code Status: FULL CODE Family Communication:  Disposition Plan: tele bed   Consultants:  none  Antimicrobials:  Rocephin 12/5 >  Objective: Blood pressure 138/85, pulse 78, temperature 98 F (36.7 C), temperature source Oral, resp. rate 18, height 4' 11.02" (1.499 m), weight 49.9 kg, SpO2 93 %.  Intake/Output Summary (Last 24 hours) at 07/02/2019 0824 Last data filed at 07/02/2019 0654 Gross per 24 hour  Intake 917 ml  Output 500 ml  Net 417 ml   Filed Weights   07/01/19 0601  Weight: 49.9 kg    Examination: General: No acute respiratory distress Lungs: Clear to auscultation bilaterally -no wheezing Cardiovascular: Regular rate without murmur Abdomen: NT/ND, soft, BS positive Extremities: No C/C/E B lower extremities  CBC: Recent Labs  Lab 07/01/19 0154 07/02/19 0240  WBC 5.4 6.4  NEUTROABS 4.1 4.4  HGB 10.8* 11.4*  HCT 32.3* 34.0*  MCV 88.3 88.3  PLT 271 751   Basic Metabolic Panel: Recent Labs  Lab 07/01/19 0154 07/02/19 0240  NA 140 140  K 4.1 4.3  CL 104 102  CO2 25 26  GLUCOSE 122* 83  BUN 42* 38*  CREATININE 0.97 0.97  CALCIUM 8.3* 8.6*  MG 2.0 1.8   GFR: Estimated Creatinine Clearance: 28.9 mL/min (by C-G formula based on SCr of 0.97 mg/dL).  Liver Function Tests: Recent Labs  Lab 07/01/19 0154 07/02/19 0240  AST 33 36  ALT 19 20  ALKPHOS 37* 39  BILITOT 0.7 0.3  PROT 6.4* 6.3*  ALBUMIN 2.8* 2.7*     Scheduled Meds: . donepezil  10 mg Oral QHS  . ferrous sulfate  325 mg Oral Q breakfast  . folic acid  1 mg Oral Daily  . magnesium oxide  400 mg Oral Daily  . mirabegron ER  50 mg Oral Daily  . multivitamin with minerals  1 tablet Oral Daily  . oxybutynin  10 mg Oral BH-q7a  . predniSONE  5 mg Oral Daily  . Ensure Max Protein  11 oz Oral TID WC  . psyllium  1 packet Oral Daily  . Rivaroxaban  15 mg Oral Q supper  . simvastatin  20 mg Oral QPM  . vitamin E  400 Units Oral Daily    Continuous Infusions: . cefTRIAXone (ROCEPHIN)  IV 1 g (07/01/19 1100)  . remdesivir 100 mg in NS 100 mL 100 mg (07/01/19 1032)     LOS: 2 days   Lonia Blood, MD [facility] Office  [phone] Pager - Text Page per Amion  If 7PM-7AM, please contact night-coverage per Amion 07/02/2019, 8:24 AM

## 2019-07-03 ENCOUNTER — Other Ambulatory Visit: Payer: Self-pay

## 2019-07-03 LAB — CBC WITH DIFFERENTIAL/PLATELET
Abs Immature Granulocytes: 0.05 10*3/uL (ref 0.00–0.07)
Basophils Absolute: 0 10*3/uL (ref 0.0–0.1)
Basophils Relative: 0 %
Eosinophils Absolute: 0 10*3/uL (ref 0.0–0.5)
Eosinophils Relative: 0 %
HCT: 35.5 % — ABNORMAL LOW (ref 36.0–46.0)
Hemoglobin: 11.7 g/dL — ABNORMAL LOW (ref 12.0–15.0)
Immature Granulocytes: 1 %
Lymphocytes Relative: 16 %
Lymphs Abs: 1.2 10*3/uL (ref 0.7–4.0)
MCH: 29.5 pg (ref 26.0–34.0)
MCHC: 33 g/dL (ref 30.0–36.0)
MCV: 89.4 fL (ref 80.0–100.0)
Monocytes Absolute: 1 10*3/uL (ref 0.1–1.0)
Monocytes Relative: 13 %
Neutro Abs: 5.2 10*3/uL (ref 1.7–7.7)
Neutrophils Relative %: 70 %
Platelets: 336 10*3/uL (ref 150–400)
RBC: 3.97 MIL/uL (ref 3.87–5.11)
RDW: 14.1 % (ref 11.5–15.5)
WBC: 7.5 10*3/uL (ref 4.0–10.5)
nRBC: 0 % (ref 0.0–0.2)

## 2019-07-03 LAB — COMPREHENSIVE METABOLIC PANEL
ALT: 28 U/L (ref 0–44)
AST: 50 U/L — ABNORMAL HIGH (ref 15–41)
Albumin: 2.8 g/dL — ABNORMAL LOW (ref 3.5–5.0)
Alkaline Phosphatase: 38 U/L (ref 38–126)
Anion gap: 11 (ref 5–15)
BUN: 35 mg/dL — ABNORMAL HIGH (ref 8–23)
CO2: 25 mmol/L (ref 22–32)
Calcium: 7.9 mg/dL — ABNORMAL LOW (ref 8.9–10.3)
Chloride: 92 mmol/L — ABNORMAL LOW (ref 98–111)
Creatinine, Ser: 0.72 mg/dL (ref 0.44–1.00)
GFR calc Af Amer: >60 mL/min (ref >60)
GFR calc non Af Amer: >60 mL/min (ref >60)
Glucose, Bld: 67 mg/dL — ABNORMAL LOW (ref 70–99)
Potassium: 4.3 mmol/L (ref 3.5–5.1)
Sodium: 128 mmol/L — ABNORMAL LOW (ref 135–145)
Total Bilirubin: 0.6 mg/dL (ref 0.3–1.2)
Total Protein: 6.4 g/dL — ABNORMAL LOW (ref 6.5–8.1)

## 2019-07-03 LAB — MAGNESIUM: Magnesium: 1.9 mg/dL (ref 1.7–2.4)

## 2019-07-03 LAB — D-DIMER, QUANTITATIVE: D-Dimer, Quant: 2.33 ug/mL-FEU — ABNORMAL HIGH (ref 0.00–0.50)

## 2019-07-03 LAB — FERRITIN: Ferritin: 337 ng/mL — ABNORMAL HIGH (ref 11–307)

## 2019-07-03 LAB — C-REACTIVE PROTEIN: CRP: 8.1 mg/dL — ABNORMAL HIGH (ref <1.0)

## 2019-07-03 NOTE — Progress Notes (Signed)
Zoe Robinson  JSH:[id number] DOB: 01-11-1932 DOA: 06/30/2019 PCP: Darlis Loan, NP    Brief Narrative:  85 year old with a history of dementia, recurrent UTIs, CKD stage III, RA on chronic prednisone and methotrexate, atrial fibrillation on chronic Xarelto, and HTN who presented to the [facility] ED with 1 week of fever and severe generalized weakness. In the ED her oxygen saturation was 92% on room air.  She was noted to have a serum creatinine of 1.3 with a BUN of 48 and a sodium of 132.  Procalcitonin was noted to be 0.11.  Urinalysis noted leukocyte esterase and bacteria with elevated WBCs.  CTa of the chest was negative for PE but did note groundglass opacities bilaterally consistent with a viral pneumonitis.  Significant Events: 12/5 admit to [facility] via [place] ED  COVID-19 specific Treatment: Remdesivir 12/5 > 12/9  Subjective: Saturations are stable on room air.  Alert and quite pleasant.  Denies chest pain, nausea vomiting, or abdominal pain.  Assessment & Plan:  COVID Pneumonia As evidenced by CTa chest - no respiratory failure at this time with patient stable on room air - no indication for steroid for this reason - continue remdesivir -no indication for other therapies at this time -would avoid use of Actemra due to chronic use of methotrexate -completes her course of remdesivir 12/9  Recent Labs  Lab 07/01/19 0154 07/02/19 0240 07/03/19 0300  DDIMER 2.18* 2.87* 2.33*  FERRITIN 312* 353* 337*  CRP 7.7* 5.2* 8.1*  ALT 19 20 28     Recurrent UTIs - Acute E coli UTI Urinalysis in the [facility] ED c/w UTI - culture data from [place] noted >100k E coli which is pan sensitive - continue directed antibiotic tx for planned 5 days of tx   CKD stage III Creatinine is stable  Recent Labs  Lab 07/01/19 0154 07/02/19 0240 07/03/19 0300  CREATININE 0.97 0.97 0.72    RA on chronic steroid and methotrexate Continue usual home medications  Chronic atrial  fibrillation Continue usual home meds to include Xarelto  HTN Blood pressure controlled   Dementia Pleasant and interactive with no agitation at this time  DVT prophylaxis: Xarelto Code Status: FULL CODE Family Communication:  Disposition Plan: Requires SNF placement -will be medically clear for discharge to SNF 12/9  Consultants:  none  Antimicrobials:  Rocephin 12/5 > 12/9  Objective: Blood pressure 130/77, pulse 74, temperature 98.6 F (37 C), temperature source Oral, resp. rate 18, height 4' 11.02" (1.499 m), weight 49.9 kg, SpO2 96 %.  Intake/Output Summary (Last 24 hours) at 07/03/2019 0841 Last data filed at 07/03/2019 0441 Gross per 24 hour  Intake 820 ml  Output 1050 ml  Net -230 ml   Filed Weights   07/01/19 0601  Weight: 49.9 kg    Examination: General: No acute respiratory distress Lungs: Clear to auscultation bilaterally without wheezing Cardiovascular: Regular rate -no murmur Abdomen: NT/ND, soft, BS positive Extremities: No C/C/E B LE  CBC: Recent Labs  Lab 07/01/19 0154 07/02/19 0240 07/03/19 0300  WBC 5.4 6.4 7.5  NEUTROABS 4.1 4.4 5.2  HGB 10.8* 11.4* 11.7*  HCT 32.3* 34.0* 35.5*  MCV 88.3 88.3 89.4  PLT 271 302 850   Basic Metabolic Panel: Recent Labs  Lab 07/01/19 0154 07/02/19 0240 07/03/19 0300  NA 140 140 128*  K 4.1 4.3 4.3  CL 104 102 92*  CO2 25 26 25   GLUCOSE 122* 83 67*  BUN 42* 38* 35*  CREATININE 0.97 0.97 0.72  CALCIUM  8.3* 8.6* 7.9*  MG 2.0 1.8 1.9   GFR: Estimated Creatinine Clearance: 35.1 mL/min (by C-G formula based on SCr of 0.72 mg/dL).  Liver Function Tests: Recent Labs  Lab 07/01/19 0154 07/02/19 0240 07/03/19 0300  AST 33 36 50*  ALT 19 20 28   ALKPHOS 37* 39 38  BILITOT 0.7 0.3 0.6  PROT 6.4* 6.3* 6.4*  ALBUMIN 2.8* 2.7* 2.8*     Scheduled Meds: . donepezil  10 mg Oral QHS  . ferrous sulfate  325 mg Oral Q breakfast  . folic acid  1 mg Oral Daily  . magnesium oxide  400 mg Oral Daily   . mirabegron ER  50 mg Oral Daily  . multivitamin with minerals  1 tablet Oral Daily  . oxybutynin  10 mg Oral BH-q7a  . predniSONE  5 mg Oral Daily  . Ensure Max Protein  11 oz Oral TID WC  . psyllium  1 packet Oral Daily  . Rivaroxaban  15 mg Oral Q supper  . simvastatin  20 mg Oral QPM  . vitamin E  400 Units Oral Daily   Continuous Infusions: . cefTRIAXone (ROCEPHIN)  IV 1 g (07/02/19 1118)  . remdesivir 100 mg in NS 100 mL 100 mg (07/03/19 0835)     LOS: 3 days   14/08/20, MD [facility] Office  [phone] Pager - Text Page per Amion  If 7PM-7AM, please contact night-coverage per Amion 07/03/2019, 8:41 AM

## 2019-07-03 NOTE — Progress Notes (Signed)
Fax of Urine culture results received from [facility].

## 2019-07-04 DIAGNOSIS — I4821 Permanent atrial fibrillation: Secondary | ICD-10-CM

## 2019-07-04 DIAGNOSIS — I00 Rheumatic fever without heart involvement: Secondary | ICD-10-CM

## 2019-07-04 LAB — CBC WITH DIFFERENTIAL/PLATELET
Abs Immature Granulocytes: 0.07 10*3/uL (ref 0.00–0.07)
Basophils Absolute: 0 10*3/uL (ref 0.0–0.1)
Basophils Relative: 0 %
Eosinophils Absolute: 0.1 10*3/uL (ref 0.0–0.5)
Eosinophils Relative: 1 %
HCT: 34.1 % — ABNORMAL LOW (ref 36.0–46.0)
Hemoglobin: 11.4 g/dL — ABNORMAL LOW (ref 12.0–15.0)
Immature Granulocytes: 1 %
Lymphocytes Relative: 15 %
Lymphs Abs: 1.3 10*3/uL (ref 0.7–4.0)
MCH: 29.5 pg (ref 26.0–34.0)
MCHC: 33.4 g/dL (ref 30.0–36.0)
MCV: 88.3 fL (ref 80.0–100.0)
Monocytes Absolute: 1.3 10*3/uL — ABNORMAL HIGH (ref 0.1–1.0)
Monocytes Relative: 14 %
Neutro Abs: 6.1 10*3/uL (ref 1.7–7.7)
Neutrophils Relative %: 69 %
Platelets: 368 10*3/uL (ref 150–400)
RBC: 3.86 MIL/uL — ABNORMAL LOW (ref 3.87–5.11)
RDW: 14.2 % (ref 11.5–15.5)
WBC: 8.8 10*3/uL (ref 4.0–10.5)
nRBC: 0 % (ref 0.0–0.2)

## 2019-07-04 LAB — COMPREHENSIVE METABOLIC PANEL
ALT: 33 U/L (ref 0–44)
AST: 51 U/L — ABNORMAL HIGH (ref 15–41)
Albumin: 2.6 g/dL — ABNORMAL LOW (ref 3.5–5.0)
Alkaline Phosphatase: 36 U/L — ABNORMAL LOW (ref 38–126)
Anion gap: 10 (ref 5–15)
BUN: 34 mg/dL — ABNORMAL HIGH (ref 8–23)
CO2: 26 mmol/L (ref 22–32)
Calcium: 8.3 mg/dL — ABNORMAL LOW (ref 8.9–10.3)
Chloride: 100 mmol/L (ref 98–111)
Creatinine, Ser: 0.77 mg/dL (ref 0.44–1.00)
GFR calc Af Amer: >60 mL/min (ref >60)
GFR calc non Af Amer: >60 mL/min (ref >60)
Glucose, Bld: 79 mg/dL (ref 70–99)
Potassium: 4.4 mmol/L (ref 3.5–5.1)
Sodium: 136 mmol/L (ref 135–145)
Total Bilirubin: 0.9 mg/dL (ref 0.3–1.2)
Total Protein: 5.9 g/dL — ABNORMAL LOW (ref 6.5–8.1)

## 2019-07-04 LAB — MAGNESIUM: Magnesium: 1.7 mg/dL (ref 1.7–2.4)

## 2019-07-04 LAB — D-DIMER, QUANTITATIVE: D-Dimer, Quant: 2.06 ug/mL-FEU — ABNORMAL HIGH (ref 0.00–0.50)

## 2019-07-04 LAB — FERRITIN: Ferritin: 298 ng/mL (ref 11–307)

## 2019-07-04 LAB — C-REACTIVE PROTEIN: CRP: 7.1 mg/dL — ABNORMAL HIGH (ref <1.0)

## 2019-07-04 NOTE — TOC Progression Note (Signed)
Transition of Care [facility]) - Progression Note    Patient Details  Name: Zoe Robinson MRN: [id number] Date of Birth: 01-Oct-1931  Transition of Care [facility]) CM/SW Contact  Loletha Grayer Beverely Pace, RN Phone Number: [phone] (working remotely) 07/04/2019, 12:32 PM  Clinical Narrative:   Case manager spoke with patient's daughter, Iantha Fallen via telephone to discuss her mom's need for SHort term rehab. [name] has requested patient go to a facility in [place] or [place]. Case manager has contacted her first choice- [facility], they are not accepting COVID patients, contacted [facility] and they are not accepting COVID patient's. Also contacted [facility], they could accept patient but not provide therapy, which patient needs. [name] is discussing option of patient coming home with Home Health with her brothers.Patient  was receiving services through [facility] prior to admission. Case manager contacted [facility] and confirmed that they could resume services for patient, and they can.     Expected Discharge Plan: [place]    Expected Discharge Plan and Services Expected Discharge Plan: [place]   Discharge Planning Services: CM Consult Post Acute Care Choice: Home Health, Resumption of Svcs/PTA Provider                   DME Arranged: N/A(Has RW, 3in1, shower chair and ramp) DME Agency: NA       HH Arranged: RN, PT, OT HH Agency: [place]         Social Determinants of Health (SDOH) Interventions    Readmission Risk Interventions No flowsheet data found.

## 2019-07-04 NOTE — Progress Notes (Addendum)
Physical Therapy Treatment Patient Details Name: Zoe Robinson MRN: [id number] DOB: 1931-09-17 Today's Date: 07/04/2019    History of Present Illness 85 year old with a history of dementia, recurrent UTIs, CKD stage III, RA on chronic prednisone and methotrexate, atrial fibrillation on chronic Xarelto, and HTN who presented to the [facility] ED with fever and severe generalized weakness, UTI. CTa of the chest was negative for PE but did note groundglass opacities bilaterally consistent with a viral pneumonitis.    PT Comments    Patient found with incontinence of BM, has Purewick in place. Assisted to BSc then patient ambulated x 20' using RW and Mod assistance. Continue PT    Follow Up Recommendations  SNF; 24/7 assistance  Equipment Recommendations  Wheelchair (measurements PT);Wheelchair cushion (measurements PT)(if DC home)    Recommendations for Other Services       Precautions / Restrictions Precautions Precautions: Fall Precaution Comments: incontinence with any movement.    Mobility  Bed Mobility Overal bed mobility: Needs Assistance Bed Mobility: Supine to Sit     Supine to sit: Min assist     General bed mobility comments: extra time, cues for scooting hips to EOB; required min assist to raise trunk completely  Transfers Overall transfer level: Needs assistance   Transfers: Sit to/from Stand;Stand Pivot Transfers Sit to Stand: Mod assist;+2 safety/equipment Stand pivot transfers: Mod assist;+2 safety/equipment;+2 physical assistance       General transfer comment: sit <> stand from EOB, pt then pivoted and had BSC placed underneath her  Ambulation/Gait Ambulation/Gait assistance: Min assist;+2 safety/equipment Gait Distance (Feet): 20 Feet Assistive device: Rolling walker (2 wheeled) Gait Pattern/deviations: Step-to pattern;Drifts right/left;Trunk flexed;Narrow base of support     General Gait Details: Steady assistance for balance. Patient did  progress forward  using RW.   Stairs             Wheelchair Mobility    Modified Rankin (Stroke Patients Only)       Balance Overall balance assessment: Needs assistance;History of Falls Sitting-balance support: Bilateral upper extremity supported;Feet supported Sitting balance-Leahy Scale: Fair Sitting balance - Comments: slight posterior lean   Standing balance support: During functional activity;Bilateral upper extremity supported Standing balance-Leahy Scale: Poor Standing balance comment: posterior leaning                            Cognition Arousal/Alertness: Awake/alert Behavior During Therapy: WFL for tasks assessed/performed Overall Cognitive Status: History of cognitive impairments - at baseline                                 General Comments: unsure of cognitive baseline, pt not clearly oriented to time or situation. Also seems unaware of deficits,especially incontinence      Exercises      General Comments        Pertinent Vitals/Pain Pain Assessment: No/denies pain    Home Living                      Prior Function            PT Goals (current goals can now be found in the care plan section)      Frequency    Min 3X/week      PT Plan Current plan remains appropriate;Frequency needs to be updated    Co-evaluation  AM-PAC PT "6 Clicks" Mobility   Outcome Measure  Help needed turning from your back to your side while in a flat bed without using bedrails?: A Lot Help needed moving from lying on your back to sitting on the side of a flat bed without using bedrails?: A Lot Help needed moving to and from a bed to a chair (including a wheelchair)?: A Lot Help needed standing up from a chair using your arms (e.g., wheelchair or bedside chair)?: A Lot Help needed to walk in hospital room?: A Lot Help needed climbing 3-5 steps with a railing? : Total 6 Click Score: 11    End of  Session Equipment Utilized During Treatment: Gait belt Activity Tolerance: Patient tolerated treatment well Patient left: in chair;with call bell/phone within reach;with chair alarm set;with nursing/sitter in room Nurse Communication: Mobility status PT Visit Diagnosis: Difficulty in walking, not elsewhere classified (R26.2)     Time: 0950-1008 PT Time Calculation (min) (ACUTE ONLY): 18 min  Charges:  $Gait Training: 8-22 mins                     [place] Pager [phone] Office [phone]    Claretha Cooper 07/04/2019, 2:08 PM

## 2019-07-04 NOTE — Progress Notes (Signed)
Report called for transfer to 314; s/w Heather.   Transported pt and belongings (clothing) via chair.   S/w pt's son earlier in the morning while she was eating breakfast.  Stated he'd had trouble calling into her room and connecting with her.  I put the phone by her chair as she'd worked with PT and was up in the recliner.  She smiled hearing her son on the phone speaking with me.

## 2019-07-04 NOTE — Progress Notes (Signed)
PROGRESS NOTE                                                                                                                                                                                                             Patient Demographics:    Zoe Robinson, is a 85 y.o. female, DOB - [birth year], YIA:[id number]  Outpatient Primary MD for the patient is Gabriel Cirri, NP   Admit date - 06/30/2019   LOS - 4  No chief complaint on file.      Brief Narrative: Patient is a 85 y.o. female with PMHx of stage III CKD, RA on chronic prednisone/methotrexate, A. fib on Xarelto, hypertension-who presented with 1 week history of fever, generalized weakness-she was found to have COVID-19 pneumonia.   Subjective:    [name] today feels better-she appears weak and debilitated.   Assessment  & Plan :    Covid 19 Viral pneumonia: Improved-afebrile-not on oxygen.  Will finish remdesivir on 12/9.  Continue supportive care.  Fever: afebrile  O2 requirements:  SpO2: 95 %   COVID-19 Labs: Recent Labs    07/02/19 0240 07/03/19 0300 07/04/19 0347  DDIMER 2.87* 2.33* 2.06*  FERRITIN 353* 337* 298  CRP 5.2* 8.1* 7.1*    No results found for: BNP  No results for input(s): PROCALCITON in the last 168 hours.  No results found for: SARSCOV2NAA   COVID-19 Medications: Remdesivir 12/5 > 12/9  Other medications: Diuretics:Euvolemic-no need for lasix  Prone/Incentive Spirometry: encouraged incentive spirometry use 3-4/hour.  DVT Prophylaxis  : Xarelto  E. coli UTI: Has completed a course of Rocephin 12/9.   CKD stage IIIa: Creatinine close to baseline-follow  RA: Stable-continue low-dose prednisone-methotrexate on hold  Dyslipidemia: Continue statin  HTN: Blood pressure controlled-monitor off antihypertensives  Chronic atrial fibrillation: Rate controlled-continue Xarelto  Dementia: Stable-very  pleasant-continue Aricept  Deconditioning/debility: Secondary to acute illness-social work discussion with patient/family-whether to pursue SNF or home with home health services.  Consults  :  None  Procedures  :  None  ABG: No results found for: PHART, PCO2ART, PO2ART, HCO3, TCO2, ACIDBASEDEF, O2SAT  Vent Settings: N/A   Condition - Stable  Family Communication  :  Daughter updated over the phone  Code Status :  Full Code  Diet :  Diet Order  Diet regular Room service appropriate? Yes; Fluid consistency: Thin  Diet effective now               Disposition Plan  :  Remain hospitalized- Home with HHPT vs SNF-social work/CM discussing with family/patient  Barriers to discharge: Complete remdesivir-subsequently home or SNF-depending on patient/family choice.  Antimicorbials  :    Anti-infectives (From admission, onward)   Start     Dose/Rate Route Frequency Ordered Stop   07/01/19 1000  remdesivir 100 mg in sodium chloride 0.9 % 100 mL IVPB     100 mg 200 mL/hr over 30 Minutes Intravenous Daily 07/01/19 0400 07/04/19 1040   07/01/19 1000  cefTRIAXone (ROCEPHIN) 1 g in sodium chloride 0.9 % 100 mL IVPB     1 g 200 mL/hr over 30 Minutes Intravenous Every 24 hours 07/01/19 0910 07/04/19 1049   07/01/19 0015  remdesivir 100 mg in sodium chloride 0.9 % 100 mL IVPB  Status:  Discontinued     100 mg 200 mL/hr over 30 Minutes Intravenous Daily 07/01/19 0011 07/01/19 0357      Inpatient Medications  Scheduled Meds: . donepezil  10 mg Oral QHS  . ferrous sulfate  325 mg Oral Q breakfast  . folic acid  1 mg Oral Daily  . magnesium oxide  400 mg Oral Daily  . mirabegron ER  50 mg Oral Daily  . multivitamin with minerals  1 tablet Oral Daily  . oxybutynin  10 mg Oral BH-q7a  . predniSONE  5 mg Oral Daily  . Ensure Max Protein  11 oz Oral TID WC  . psyllium  1 packet Oral Daily  . Rivaroxaban  15 mg Oral Q supper  . simvastatin  20 mg Oral QPM  . vitamin E  400  Units Oral Daily   Continuous Infusions: PRN Meds:.acetaminophen, bisacodyl, ondansetron **OR** ondansetron (ZOFRAN) IV, polyethylene glycol, traMADol   Time Spent in minutes  25  See all Orders from today for further details   Oren Binet M.D on 07/04/2019 at 1:36 PM  To page go to [email or web address] - use universal password  [facility] -  Office  [phone]    Objective:   Vitals:   07/03/19 1634 07/03/19 1915 07/04/19 0436 07/04/19 0811  BP: (!) 141/87  132/73 123/62  Pulse: 75  99   Resp: 17 18 19 16   Temp: 98.3 F (36.8 C) 98 F (36.7 C) 98.2 F (36.8 C) 98.4 F (36.9 C)  TempSrc: Oral Oral Oral Oral  SpO2: 97%  95%   Weight:      Height:        Wt Readings from Last 3 Encounters:  07/01/19 49.9 kg  03/15/19 50.9 kg  05/24/18 50.6 kg     Intake/Output Summary (Last 24 hours) at 07/04/2019 1336 Last data filed at 07/04/2019 0440 Gross per 24 hour  Intake 480 ml  Output 1550 ml  Net -1070 ml     Physical Exam Gen Exam:Alert awake-not in any distress HEENT:atraumatic, normocephalic Chest: B/L clear to auscultation anteriorly CVS:S1S2 regular Abdomen:soft non tender, non distended Extremities:no edema Neurology: Non focal Skin: no rash   Data Review:    CBC Recent Labs  Lab 07/01/19 0154 07/02/19 0240 07/03/19 0300 07/04/19 0347  WBC 5.4 6.4 7.5 8.8  HGB 10.8* 11.4* 11.7* 11.4*  HCT 32.3* 34.0* 35.5* 34.1*  PLT 271 302 336 368  MCV 88.3 88.3 89.4 88.3  MCH 29.5 29.6 29.5 29.5  MCHC 33.4 33.5 33.0 33.4  RDW 14.1 14.2 14.1 14.2  LYMPHSABS 0.7 1.2 1.2 1.3  MONOABS 0.6 0.8 1.0 1.3*  EOSABS 0.0 0.0 0.0 0.1  BASOSABS 0.0 0.0 0.0 0.0    Chemistries  Recent Labs  Lab 07/01/19 0154 07/02/19 0240 07/03/19 0300 07/04/19 0347  NA 140 140 128* 136  K 4.1 4.3 4.3 4.4  CL 104 102 92* 100  CO2 25 26 25 26   GLUCOSE 122* 83 67* 79  BUN 42* 38* 35* 34*  CREATININE 0.97 0.97 0.72 0.77  CALCIUM 8.3* 8.6* 7.9* 8.3*  MG 2.0 1.8 1.9  1.7  AST 33 36 50* 51*  ALT 19 20 28  33  ALKPHOS 37* 39 38 36*  BILITOT 0.7 0.3 0.6 0.9   ------------------------------------------------------------------------------------------------------------------ No results for input(s): CHOL, HDL, LDLCALC, TRIG, CHOLHDL, LDLDIRECT in the last 72 hours.  No results found for: HGBA1C ------------------------------------------------------------------------------------------------------------------ No results for input(s): TSH, T4TOTAL, T3FREE, THYROIDAB in the last 72 hours.  Invalid input(s): FREET3 ------------------------------------------------------------------------------------------------------------------ Recent Labs    07/03/19 0300 07/04/19 0347  FERRITIN 337* 298    Coagulation profile No results for input(s): INR, PROTIME in the last 168 hours.  Recent Labs    07/03/19 0300 07/04/19 0347  DDIMER 2.33* 2.06*    Cardiac Enzymes No results for input(s): CKMB, TROPONINI, MYOGLOBIN in the last 168 hours.  Invalid input(s): CK ------------------------------------------------------------------------------------------------------------------ No results found for: BNP  Micro Results No results found for this or any previous visit (from the past 240 hour(s)).  Radiology Reports No results found.

## 2019-07-04 NOTE — TOC Progression Note (Signed)
Transition of Care [facility]) - Progression Note    Patient Details  Name: Zoe Robinson MRN: [id number] Date of Birth: [birth year]  Transition of Care [facility]) CM/SW Contact  Loletha Grayer Beverely Pace, RN Phone Number: 07/04/2019, 2:12 PM  Clinical Narrative:  Case manager has spoken with patient's daughter Shauna Hugh. She has agreed after speaking with MD, for her mom to be faxed out for SNF. Her request is for [facility]. CM contacted [place] with [facility], submitted information via the [place]. Case manager has started insurance auth process. Reference [id number]. Bed likely not available until Friday.    Expected Discharge Plan: [place]    Expected Discharge Plan and Services Expected Discharge Plan: [place]   Discharge Planning Services: CM Consult Post Acute Care Choice: Home Health, Resumption of Svcs/PTA Provider                   DME Arranged: N/A(Has RW, 3in1, shower chair and ramp) DME Agency: NA       HH Arranged: RN, PT, OT HH Agency: [place]         Social Determinants of Health (SDOH) Interventions    Readmission Risk Interventions No flowsheet data found.

## 2019-07-04 NOTE — NC FL2 (Signed)
[place] MEDICAID FL2 LEVEL OF CARE SCREENING TOOL     IDENTIFICATION  Patient Name: Zoe Robinson Birthdate: Mar 04, 1932 Sex: female Admission Date (Current Location): 06/30/2019  [facility] and [place] Number:  Producer, television/film/video and Address:  [facility], [address], [place], [place] [address]([address]      Provider Number: [id number]  Attending Physician Name and Address:  Maretta Bees, MD  Relative Name and Phone Number:  daughter: Erskine Emery  [phone]    Current Level of Care: Hospital Recommended Level of Care: Skilled Nursing Facility Prior Approval Number:    Date Approved/Denied:   PASRR Number: [id number]  Discharge Plan: SNF    Current Diagnoses: Patient Active Problem List   Diagnosis Date Noted  . COVID-19 virus infection 06/30/2019  . High risk medication use 03/25/2017  . Rheumatoid arthritis involving multiple sites with positive rheumatoid factor (HCC) 03/15/2017  . Rheumatoid arthritis involving both knees with positive rheumatoid factor (HCC) 03/15/2017  . Rheumatoid arthritis involving both hands with positive rheumatoid factor (HCC) 03/15/2017  . Rheumatoid arthritis involving both feet with positive rheumatoid factor (HCC) 03/15/2017  . History of vertebral fracture 03/15/2017  . On prednisone therapy 02/10/2017  . Age-related osteoporosis without current pathological fracture 02/10/2017  . History of pulmonary hypertension 02/10/2017  . History of atrial fibrillation 02/10/2017  . History of anemia 02/10/2017  . History of recurrent UTIs 02/10/2017  . History of hypercalcemia 02/10/2017  . On anticoagulant therapy 02/10/2017  . Acute encephalopathy 08/19/2014  . Neutropenia (HCC) 04/20/2014  . Fever, unspecified 04/20/2014  . Unspecified constipation 04/20/2014  . Hypercalcemia 04/15/2014  . Acute renal failure (HCC) 04/15/2014  . Generalized weakness 04/15/2014  . Dehydration  04/15/2014  . UTI (urinary tract infection) 04/15/2014  . Iron deficiency anemia 04/15/2014  . CKD (chronic kidney disease)   . Hypertension   . Atrial fibrillation (HCC)     Orientation RESPIRATION BLADDER Height & Weight     Self, Time, Situation, Place  Normal Incontinent Weight: 49.9 kg Height:  4' 11.02" (149.9 cm)  BEHAVIORAL SYMPTOMS/MOOD NEUROLOGICAL BOWEL NUTRITION STATUS      Continent    AMBULATORY STATUS COMMUNICATION OF NEEDS Skin   Limited Assist Verbally Normal                       Personal Care Assistance Level of Assistance              Functional Limitations Info             SPECIAL CARE FACTORS FREQUENCY  PT (By licensed PT), OT (By licensed OT)     PT Frequency: 5x/week OT Frequency: min3/week            Contractures      Additional Factors Info  Code Status Code Status Info: full             Current Medications (07/04/2019):  This is the current hospital active medication list Current Facility-Administered Medications  Medication Dose Route Frequency Provider Last Rate Last Dose  . acetaminophen (TYLENOL) tablet 650 mg  650 mg Oral Q6H PRN Lonia Blood, MD      . bisacodyl (DULCOLAX) suppository 10 mg  10 mg Rectal Daily PRN Jetty Duhamel T, MD      . donepezil (ARICEPT) tablet 10 mg  10 mg Oral QHS Lonia Blood, MD   10 mg at 07/03/19 2155  . ferrous sulfate  tablet 325 mg  325 mg Oral Q breakfast Cherene Altes, MD   325 mg at 07/04/19 0900  . folic acid (FOLVITE) tablet 1 mg  1 mg Oral Daily Cherene Altes, MD   1 mg at 07/04/19 1002  . magnesium oxide (MAG-OX) tablet 400 mg  400 mg Oral Daily Cherene Altes, MD   400 mg at 07/04/19 1004  . mirabegron ER (MYRBETRIQ) tablet 50 mg  50 mg Oral Daily Cherene Altes, MD   50 mg at 07/04/19 1002  . multivitamin with minerals tablet 1 tablet  1 tablet Oral Daily Cherene Altes, MD   1 tablet at 07/04/19 1004  . ondansetron (ZOFRAN) tablet 4 mg  4  mg Oral Q6H PRN Cherene Altes, MD       Or  . ondansetron [facility]) injection 4 mg  4 mg Intravenous Q6H PRN Cherene Altes, MD      . oxybutynin (DITROPAN-XL) 24 hr tablet 10 mg  10 mg Oral Donnajean Lopes, MD   10 mg at 07/04/19 0636  . polyethylene glycol (MIRALAX / GLYCOLAX) packet 17 g  17 g Oral Daily PRN Joette Catching T, MD      . predniSONE (DELTASONE) tablet 5 mg  5 mg Oral Daily Cherene Altes, MD   5 mg at 07/04/19 1001  . protein supplement (ENSURE MAX) liquid  11 oz Oral TID WC Cherene Altes, MD   11 oz at 07/03/19 1632  . psyllium (HYDROCIL/METAMUCIL) packet 1 packet  1 packet Oral Daily Cherene Altes, MD   1 packet at 07/04/19 1000  . Rivaroxaban (XARELTO) tablet 15 mg  15 mg Oral Q supper Cherene Altes, MD   15 mg at 07/03/19 1631  . simvastatin (ZOCOR) tablet 20 mg  20 mg Oral QPM Cherene Altes, MD   20 mg at 07/03/19 1632  . traMADol (ULTRAM) tablet 50 mg  50 mg Oral Q6H PRN Cherene Altes, MD      . vitamin E capsule 400 Units  400 Units Oral Daily Cherene Altes, MD   400 Units at 07/04/19 1005     Discharge Medications: Please see discharge summary for a list of discharge medications.  Relevant Imaging Results:  Relevant Lab Results:   Additional Information SSN: [id number]  Ninfa Meeker, RN

## 2019-07-05 LAB — CBC WITH DIFFERENTIAL/PLATELET
Abs Immature Granulocytes: 0.07 10*3/uL (ref 0.00–0.07)
Basophils Absolute: 0 10*3/uL (ref 0.0–0.1)
Basophils Relative: 0 %
Eosinophils Absolute: 0.1 10*3/uL (ref 0.0–0.5)
Eosinophils Relative: 1 %
HCT: 31.9 % — ABNORMAL LOW (ref 36.0–46.0)
Hemoglobin: 10.7 g/dL — ABNORMAL LOW (ref 12.0–15.0)
Immature Granulocytes: 1 %
Lymphocytes Relative: 20 %
Lymphs Abs: 1.3 10*3/uL (ref 0.7–4.0)
MCH: 29.3 pg (ref 26.0–34.0)
MCHC: 33.5 g/dL (ref 30.0–36.0)
MCV: 87.4 fL (ref 80.0–100.0)
Monocytes Absolute: 1 10*3/uL (ref 0.1–1.0)
Monocytes Relative: 15 %
Neutro Abs: 4.1 10*3/uL (ref 1.7–7.7)
Neutrophils Relative %: 63 %
Platelets: 418 10*3/uL — ABNORMAL HIGH (ref 150–400)
RBC: 3.65 MIL/uL — ABNORMAL LOW (ref 3.87–5.11)
RDW: 14.2 % (ref 11.5–15.5)
WBC: 6.6 10*3/uL (ref 4.0–10.5)
nRBC: 0 % (ref 0.0–0.2)

## 2019-07-05 LAB — COMPREHENSIVE METABOLIC PANEL
ALT: 29 U/L (ref 0–44)
AST: 38 U/L (ref 15–41)
Albumin: 2.5 g/dL — ABNORMAL LOW (ref 3.5–5.0)
Alkaline Phosphatase: 38 U/L (ref 38–126)
Anion gap: 9 (ref 5–15)
BUN: 28 mg/dL — ABNORMAL HIGH (ref 8–23)
CO2: 26 mmol/L (ref 22–32)
Calcium: 8.4 mg/dL — ABNORMAL LOW (ref 8.9–10.3)
Chloride: 102 mmol/L (ref 98–111)
Creatinine, Ser: 0.67 mg/dL (ref 0.44–1.00)
GFR calc Af Amer: >60 mL/min (ref >60)
GFR calc non Af Amer: >60 mL/min (ref >60)
Glucose, Bld: 81 mg/dL (ref 70–99)
Potassium: 3.8 mmol/L (ref 3.5–5.1)
Sodium: 137 mmol/L (ref 135–145)
Total Bilirubin: 0.7 mg/dL (ref 0.3–1.2)
Total Protein: 5.8 g/dL — ABNORMAL LOW (ref 6.5–8.1)

## 2019-07-05 LAB — FERRITIN: Ferritin: 291 ng/mL (ref 11–307)

## 2019-07-05 LAB — MAGNESIUM: Magnesium: 1.8 mg/dL (ref 1.7–2.4)

## 2019-07-05 LAB — D-DIMER, QUANTITATIVE: D-Dimer, Quant: 2.06 ug/mL-FEU — ABNORMAL HIGH (ref 0.00–0.50)

## 2019-07-05 LAB — C-REACTIVE PROTEIN: CRP: 5.8 mg/dL — ABNORMAL HIGH (ref <1.0)

## 2019-07-05 NOTE — Progress Notes (Signed)
PROGRESS NOTE                                                                                                                                                                                                             Patient Demographics:    Zoe Robinson, is a 85 y.o. female, DOB - 1931/10/06, DHW:[id number]  Outpatient Primary MD for the patient is Gabriel Cirri, NP   Admit date - 06/30/2019   LOS - 5  No chief complaint on file.      Brief Narrative: Patient is a 85 y.o. female with PMHx of stage III CKD, RA on chronic prednisone/methotrexate, A. fib on Xarelto, hypertension-who presented with 1 week history of fever, generalized weakness-she was found to have COVID-19 pneumonia.   Subjective:    [name] today was sitting up at bed and eating breakfast.  She was in good spirits.  She denied chest pain or shortness of breath.   Assessment  & Plan :    Covid 19 Viral pneumonia: Significantly improved-on room air.  Treated with remdesivir and steroids.  Has completed a course of remdesivir on 12/9.  Continue supportive care  Fever: afebrile  O2 requirements: Room air SpO2: 96 %   COVID-19 Labs: Recent Labs    07/03/19 0300 07/04/19 0347 07/05/19 0415  DDIMER 2.33* 2.06* 2.06*  FERRITIN 337* 298 291  CRP 8.1* 7.1* 5.8*    No results found for: BNP  No results for input(s): PROCALCITON in the last 168 hours.  No results found for: SARSCOV2NAA   COVID-19 Medications: Remdesivir 12/5 > 12/9  Other medications: Diuretics:Euvolemic-no need for lasix  Prone/Incentive Spirometry: encouraged incentive spirometry use 3-4/hour.  DVT Prophylaxis  : Xarelto  E. coli UTI: Has completed a course of Rocephin 12/9.   CKD stage IIIa: Creatinine close to baseline-follow  RA: Stable-continue low-dose prednisone-methotrexate on hold  Dyslipidemia: Continue statin  HTN: Blood pressure  controlled-monitor off antihypertensives  Chronic atrial fibrillation: Rate controlled-continue Xarelto  Dementia: Stable-very pleasant-continue Aricept  Deconditioning/debility: Secondary to acute illness-after discussion with family-plans are for SNF.  Per social work-probably will have a SNF bed ready on 12/11.  Consults  :  None  Procedures  :  None  ABG: No results found for: PHART, PCO2ART, PO2ART, HCO3, TCO2, ACIDBASEDEF, O2SAT  Vent Settings: N/A   Condition - Stable  Family Communication  : Left a voicemail for daughter  Code Status :  Full Code  Diet :  Diet Order            Diet regular Room service appropriate? Yes; Fluid consistency: Thin  Diet effective now               Disposition Plan  :  Remain hospitalized-SNF on discharge.  Barriers to discharge: Ongoing supportive care-awaiting SNF bed placement.  Antimicorbials  :    Anti-infectives (From admission, onward)   Start     Dose/Rate Route Frequency Ordered Stop   07/01/19 1000  remdesivir 100 mg in sodium chloride 0.9 % 100 mL IVPB     100 mg 200 mL/hr over 30 Minutes Intravenous Daily 07/01/19 0400 07/04/19 1110   07/01/19 1000  cefTRIAXone (ROCEPHIN) 1 g in sodium chloride 0.9 % 100 mL IVPB     1 g 200 mL/hr over 30 Minutes Intravenous Every 24 hours 07/01/19 0910 07/04/19 1200   07/01/19 0015  remdesivir 100 mg in sodium chloride 0.9 % 100 mL IVPB  Status:  Discontinued     100 mg 200 mL/hr over 30 Minutes Intravenous Daily 07/01/19 0011 07/01/19 0357      Inpatient Medications  Scheduled Meds: . donepezil  10 mg Oral QHS  . ferrous sulfate  325 mg Oral Q breakfast  . folic acid  1 mg Oral Daily  . magnesium oxide  400 mg Oral Daily  . mirabegron ER  50 mg Oral Daily  . multivitamin with minerals  1 tablet Oral Daily  . oxybutynin  10 mg Oral BH-q7a  . predniSONE  5 mg Oral Daily  . Ensure Max Protein  11 oz Oral TID WC  . psyllium  1 packet Oral Daily  . Rivaroxaban  15 mg Oral Q  supper  . simvastatin  20 mg Oral QPM  . vitamin E  400 Units Oral Daily   Continuous Infusions: PRN Meds:.acetaminophen, bisacodyl, ondansetron **OR** ondansetron (ZOFRAN) IV, polyethylene glycol, traMADol   Time Spent in minutes  25  See all Orders from today for further details   Jeoffrey Massed M.D on 07/05/2019 at 9:46 AM  To page go to [email or web address] - use universal password  [facility] -  Office  [phone]    Objective:   Vitals:   07/04/19 1812 07/04/19 2036 07/05/19 0552 07/05/19 0802  BP: 129/60 (!) 151/71 (!) 141/84 (!) 141/81  Pulse: 82 85 87 73  Resp: 16 15 15 16   Temp: 98 F (36.7 C) 98.4 F (36.9 C) 97.9 F (36.6 C) 97.9 F (36.6 C)  TempSrc: Axillary Axillary Axillary Axillary  SpO2: 94% 96% 96% 96%  Weight:      Height:        Wt Readings from Last 3 Encounters:  07/01/19 49.9 kg  03/15/19 50.9 kg  05/24/18 50.6 kg     Intake/Output Summary (Last 24 hours) at 07/05/2019 0946 Last data filed at 07/05/2019 0804 Gross per 24 hour  Intake 360 ml  Output 700 ml  Net -340 ml     Physical Exam Gen Exam:Alert awake-not in any distress.  Looks very frail and debilitated. HEENT:atraumatic, normocephalic Chest: B/L clear to auscultation anteriorly CVS:S1S2 regular Abdomen:soft non tender, non distended Extremities:no edema Neurology: Non focal Skin: no rash   Data Review:    CBC Recent Labs  Lab 07/01/19 0154 07/02/19 0240 07/03/19 0300 07/04/19 0347 07/05/19 0415  WBC 5.4 6.4 7.5 8.8 6.6  HGB 10.8* 11.4* 11.7* 11.4* 10.7*  HCT 32.3* 34.0* 35.5* 34.1* 31.9*  PLT 271 302 336 368 418*  MCV 88.3 88.3 89.4 88.3 87.4  MCH 29.5 29.6 29.5 29.5 29.3  MCHC 33.4 33.5 33.0 33.4 33.5  RDW 14.1 14.2 14.1 14.2 14.2  LYMPHSABS 0.7 1.2 1.2 1.3 1.3  MONOABS 0.6 0.8 1.0 1.3* 1.0  EOSABS 0.0 0.0 0.0 0.1 0.1  BASOSABS 0.0 0.0 0.0 0.0 0.0    Chemistries  Recent Labs  Lab 07/01/19 0154 07/02/19 0240 07/03/19 0300 07/04/19 0347  07/05/19 0415  NA 140 140 128* 136 137  K 4.1 4.3 4.3 4.4 3.8  CL 104 102 92* 100 102  CO2 25 26 25 26 26   GLUCOSE 122* 83 67* 79 81  BUN 42* 38* 35* 34* 28*  CREATININE 0.97 0.97 0.72 0.77 0.67  CALCIUM 8.3* 8.6* 7.9* 8.3* 8.4*  MG 2.0 1.8 1.9 1.7 1.8  AST 33 36 50* 51* 38  ALT 19 20 28  33 29  ALKPHOS 37* 39 38 36* 38  BILITOT 0.7 0.3 0.6 0.9 0.7   ------------------------------------------------------------------------------------------------------------------ No results for input(s): CHOL, HDL, LDLCALC, TRIG, CHOLHDL, LDLDIRECT in the last 72 hours.  No results found for: HGBA1C ------------------------------------------------------------------------------------------------------------------ No results for input(s): TSH, T4TOTAL, T3FREE, THYROIDAB in the last 72 hours.  Invalid input(s): FREET3 ------------------------------------------------------------------------------------------------------------------ Recent Labs    07/04/19 0347 07/05/19 0415  FERRITIN 298 291    Coagulation profile No results for input(s): INR, PROTIME in the last 168 hours.  Recent Labs    07/04/19 0347 07/05/19 0415  DDIMER 2.06* 2.06*    Cardiac Enzymes No results for input(s): CKMB, TROPONINI, MYOGLOBIN in the last 168 hours.  Invalid input(s): CK ------------------------------------------------------------------------------------------------------------------ No results found for: BNP  Micro Results No results found for this or any previous visit (from the past 240 hour(s)).  Radiology Reports No results found.

## 2019-07-05 NOTE — Care Management Important Message (Signed)
Important Message  Patient Details  Name: Zoe Robinson MRN: [id number] Date of Birth: Feb 24, 1932   Medicare Important Message Given:  Yes - Important Message mailed due to current National Emergency  Verbal consent obtained due to current National Emergency  Relationship to patient: Child Contact Name: Iantha Fallen Call Date: 07/05/19  Time: 35 Phone: [phone] Outcome: Spoke with contact Important Message mailed to: Patient address on file    [place] 07/05/2019, 10:15 AM

## 2019-07-06 DIAGNOSIS — I10 Essential (primary) hypertension: Secondary | ICD-10-CM

## 2019-07-06 MED ORDER — METHOTREXATE 2.5 MG PO TABS: ORAL_TABLET | ORAL | 0 refills | Status: DC

## 2019-07-06 NOTE — Discharge Summary (Signed)
PATIENT DETAILS Name: Zoe Robinson Age: 85 y.o. Sex: female Date of Birth: [birth year] MRN: [id number]. Admitting Physician: Briscoe Deutscher, MD [id number], Aline Brochure, NP  Admit Date: 06/30/2019 Discharge date: 07/06/2019  Recommendations for Outpatient Follow-up:  1. Follow up with PCP in 1-2 weeks 2. Please obtain CMP/CBC in one week 3. Repeat Chest Xray in 4-6 week   Admitted From:  Home  Disposition: SNF   Home Health: No  Equipment/Devices: None  Discharge Condition: Stable  CODE STATUS: FULL CODE  Diet recommendation:  Diet Order            Diet - low sodium heart healthy        Diet regular Room service appropriate? Yes; Fluid consistency: Thin  Diet effective now               Brief Summary: See H&P, Labs, Consult and Test reports for all details in briefPatient is a 85 y.o. female with PMHx of stage III CKD, RA on chronic prednisone/methotrexate, A. fib on Xarelto, hypertension-who presented with 1 week history of fever, generalized weakness-she was found to have COVID-19 pneumonia.  Brief Hospital Course:  Covid 19 Viral pneumonia: Significantly improved-on room air.  Treated with remdesivir and steroids.  Has completed a course of remdesivir on 12/9.  Continue supportive care  COVID-19 Labs:  Recent Labs    07/04/19 0347 07/05/19 0415  DDIMER 2.06* 2.06*  FERRITIN 298 291  CRP 7.1* 5.8*    No results found for: SARSCOV2NAA   COVID-19 Medications: Remdesivir 12/5 >12/9  E. coli UTI: Has completed a course of Rocephin 12/9.   CKD stage IIIa: Creatinine close to baseline-follow  RA: Stable-continue low-dose prednisone-methotrexate on hold-but if patient continues to improve-should be okay to resume in 1 week time-attending MD at SNF to reevaluate  Dyslipidemia: Continue statin  HTN: Blood pressure controlled-resume usual antihypertensives on discharge  Chronic atrial fibrillation: Rate controlled-continue  Xarelto  Dementia: Stable-very pleasant-continue Aricept  Deconditioning/debility: Secondary to acute illness-after discussion with family-plans are for SNF.     Procedures/Studies: None  Discharge Diagnoses:  Active Problems:   COVID-19 virus infection   Discharge Instructions:    Person Under Monitoring Name: Zoe Robinson  Location: [address]  [place] [address]   Infection Prevention Recommendations for Individuals Confirmed to have, or Being Evaluated for, 2019 Novel Coronavirus (COVID-19) Infection Who Receive Care at Home  Individuals who are confirmed to have, or are being evaluated for, COVID-19 should follow the prevention steps below until a healthcare provider or local or state health department says they can return to normal activities.  Stay home except to get medical care You should restrict activities outside your home, except for getting medical care. Do not go to work, school, or public areas, and do not use public transportation or taxis.  Call ahead before visiting your doctor Before your medical appointment, call the healthcare provider and tell them that you have, or are being evaluated for, COVID-19 infection. This will help the healthcare provider's office take steps to keep other people from getting infected. Ask your healthcare provider to call the local or state health department.  Monitor your symptoms Seek prompt medical attention if your illness is worsening (e.g., difficulty breathing). Before going to your medical appointment, call the healthcare provider and tell them that you have, or are being evaluated for, COVID-19 infection. Ask your healthcare provider to call the local or state health department.  Wear a facemask You should wear a  facemask that covers your nose and mouth when you are in the same room with other people and when you visit a healthcare provider. People who live with or visit you should also wear a  facemask while they are in the same room with you.  Separate yourself from other people in your home As much as possible, you should stay in a different room from other people in your home. Also, you should use a separate bathroom, if available.  Avoid sharing household items You should not share dishes, drinking glasses, cups, eating utensils, towels, bedding, or other items with other people in your home. After using these items, you should wash them thoroughly with soap and water.  Cover your coughs and sneezes Cover your mouth and nose with a tissue when you cough or sneeze, or you can cough or sneeze into your sleeve. Throw used tissues in a lined trash can, and immediately wash your hands with soap and water for at least 20 seconds or use an alcohol-based hand rub.  Wash your [employer] your hands often and thoroughly with soap and water for at least 20 seconds. You can use an alcohol-based hand sanitizer if soap and water are not available and if your hands are not visibly dirty. Avoid touching your eyes, nose, and mouth with unwashed hands.   Prevention Steps for Caregivers and Household Members of Individuals Confirmed to have, or Being Evaluated for, COVID-19 Infection Being Cared for in the Home  If you live with, or provide care at home for, a person confirmed to have, or being evaluated for, COVID-19 infection please follow these guidelines to prevent infection:  Follow healthcare provider's instructions Make sure that you understand and can help the patient follow any healthcare provider instructions for all care.  Provide for the patient's basic needs You should help the patient with basic needs in the home and provide support for getting groceries, prescriptions, and other personal needs.  Monitor the patient's symptoms If they are getting sicker, call his or her medical provider and tell them that the patient has, or is being evaluated for, COVID-19 infection.  This will help the healthcare provider's office take steps to keep other people from getting infected. Ask the healthcare provider to call the local or state health department.  Limit the number of people who have contact with the patient  If possible, have only one caregiver for the patient.  Other household members should stay in another home or place of residence. If this is not possible, they should stay  in another room, or be separated from the patient as much as possible. Use a separate bathroom, if available.  Restrict visitors who do not have an essential need to be in the home.  Keep older adults, very young children, and other sick people away from the patient Keep older adults, very young children, and those who have compromised immune systems or chronic health conditions away from the patient. This includes people with chronic heart, lung, or kidney conditions, diabetes, and cancer.  Ensure good ventilation Make sure that shared spaces in the home have good air flow, such as from an air conditioner or an opened window, weather permitting.  Wash your hands often  Wash your hands often and thoroughly with soap and water for at least 20 seconds. You can use an alcohol based hand sanitizer if soap and water are not available and if your hands are not visibly dirty.  Avoid touching your eyes, nose, and mouth  with unwashed hands.  Use disposable paper towels to dry your hands. If not available, use dedicated cloth towels and replace them when they become wet.  Wear a facemask and gloves  Wear a disposable facemask at all times in the room and gloves when you touch or have contact with the patient's blood, body fluids, and/or secretions or excretions, such as sweat, saliva, sputum, nasal mucus, vomit, urine, or feces.  Ensure the mask fits over your nose and mouth tightly, and do not touch it during use.  Throw out disposable facemasks and gloves after using them. Do not  reuse.  Wash your hands immediately after removing your facemask and gloves.  If your personal clothing becomes contaminated, carefully remove clothing and launder. Wash your hands after handling contaminated clothing.  Place all used disposable facemasks, gloves, and other waste in a lined container before disposing them with other household waste.  Remove gloves and wash your hands immediately after handling these items.  Do not share dishes, glasses, or other household items with the patient  Avoid sharing household items. You should not share dishes, drinking glasses, cups, eating utensils, towels, bedding, or other items with a patient who is confirmed to have, or being evaluated for, COVID-19 infection.  After the person uses these items, you should wash them thoroughly with soap and water.  Wash laundry thoroughly  Immediately remove and wash clothes or bedding that have blood, body fluids, and/or secretions or excretions, such as sweat, saliva, sputum, nasal mucus, vomit, urine, or feces, on them.  Wear gloves when handling laundry from the patient.  Read and follow directions on labels of laundry or clothing items and detergent. In general, wash and dry with the warmest temperatures recommended on the label.  Clean all areas the individual has used often  Clean all touchable surfaces, such as counters, tabletops, doorknobs, bathroom fixtures, toilets, phones, keyboards, tablets, and bedside tables, every day. Also, clean any surfaces that may have blood, body fluids, and/or secretions or excretions on them.  Wear gloves when cleaning surfaces the patient has come in contact with.  Use a diluted bleach solution (e.g., dilute bleach with 1 part bleach and 10 parts water) or a household disinfectant with a label that says EPA-registered for coronaviruses. To make a bleach solution at home, add 1 tablespoon of bleach to 1 quart (4 cups) of water. For a larger supply, add  cup of  bleach to 1 gallon (16 cups) of water.  Read labels of cleaning products and follow recommendations provided on product labels. Labels contain instructions for safe and effective use of the cleaning product including precautions you should take when applying the product, such as wearing gloves or eye protection and making sure you have good ventilation during use of the product.  Remove gloves and wash hands immediately after cleaning.  Monitor yourself for signs and symptoms of illness Caregivers and household members are considered close contacts, should monitor their health, and will be asked to limit movement outside of the home to the extent possible. Follow the monitoring steps for close contacts listed on the symptom monitoring form.   ? If you have additional questions, contact your local health department or call the epidemiologist on call at [phone] (available 24/7). ? This guidance is subject to change. For the most up-to-date guidance from [facility], please refer to their website: TripMetro.huhttps://www.cdc.gov/coronavirus/2019-ncov/hcp/guidance-prevent-spread.html    Activity:  As tolerated with Full fall precautions use walker/cane & assistance as needed   Discharge Instructions  Call MD for:  difficulty breathing, headache or visual disturbances   Complete by: As directed    Call MD for:  extreme fatigue   Complete by: As directed    Call MD for:  persistant dizziness or light-headedness   Complete by: As directed    Call MD for:  persistant nausea and vomiting   Complete by: As directed    Diet - low sodium heart healthy   Complete by: As directed    Discharge instructions   Complete by: As directed    Follow with Primary MD  Gabriel Cirri, NP in 1-2 weeks  Please get a complete blood count and chemistry panel checked by your Primary MD at your next visit, and again as instructed by your Primary MD.  Get Medicines reviewed and adjusted: Please take all your medications  with you for your next visit with your Primary MD  Laboratory/radiological data: Please request your Primary MD to go over all hospital tests and procedure/radiological results at the follow up, please ask your Primary MD to get all Hospital records sent to his/her office.  In some cases, they will be blood work, cultures and biopsy results pending at the time of your discharge. Please request that your primary care M.D. follows up on these results.  Also Note the following: If you experience worsening of your admission symptoms, develop shortness of breath, life threatening emergency, suicidal or homicidal thoughts you must seek medical attention immediately by calling 911 or calling your MD immediately  if symptoms less severe.  You must read complete instructions/literature along with all the possible adverse reactions/side effects for all the Medicines you take and that have been prescribed to you. Take any new Medicines after you have completely understood and accpet all the possible adverse reactions/side effects.   Do not drive when taking Pain medications or sleeping medications (Benzodaizepines)  Do not take more than prescribed Pain, Sleep and Anxiety Medications. It is not advisable to combine anxiety,sleep and pain medications without talking with your primary care practitioner  Special Instructions: If you have smoked or chewed Tobacco  in the last 2 yrs please stop smoking, stop any regular Alcohol  and or any Recreational drug use.  Wear Seat belts while driving.  Please note: You were cared for by a hospitalist during your hospital stay. Once you are discharged, your primary care physician will handle any further medical issues. Please note that NO REFILLS for any discharge medications will be authorized once you are discharged, as it is imperative that you return to your primary care physician (or establish a relationship with a primary care physician if you do not have one) for  your post hospital discharge needs so that they can reassess your need for medications and monitor your lab values.   Increase activity slowly   Complete by: As directed      Allergies as of 07/06/2019   No Known Allergies     Medication List    TAKE these medications   acetaminophen 500 MG tablet Commonly known as: TYLENOL Take 1,000 mg by mouth as needed.   Cranberry 500 MG Tabs Take by mouth 2 (two) times daily.   denosumab 60 MG/ML Soln injection Commonly known as: PROLIA Inject 60 mg into the skin every 6 (six) months. Administer in upper arm, thigh, or abdomen   donepezil 10 MG tablet Commonly known as: ARICEPT Take 10 mg by mouth at bedtime.   feeding supplement (ENSURE COMPLETE) Liqd Take 237 mLs  by mouth 3 (three) times daily between meals.   ferrous sulfate 325 (65 FE) MG tablet Take 325 mg by mouth daily with breakfast.   folic acid 1 MG tablet Commonly known as: FOLVITE Take 1 tablet (1 mg total) by mouth every morning.   irbesartan 300 MG tablet Commonly known as: AVAPRO TK 1 T PO QD   Lactobacillus Tabs Take by mouth daily.   magnesium gluconate 500 MG tablet Commonly known as: MAGONATE Take 250 mg by mouth every morning.   methotrexate 2.5 MG tablet Commonly known as: RHEUMATREX TAKE 3 TABLETS BY MOUTH 1 TIME WEEKLY Start taking on: July 13, 2019 What changed: These instructions start on July 13, 2019. If you are unsure what to do until then, ask your doctor or other care provider.   MULTIVITAMIN PO Take 1 tablet by mouth every morning.   Myrbetriq 50 MG Tb24 tablet Generic drug: mirabegron ER Take 50 mg by mouth daily.   nitroGLYCERIN 0.4 MG SL tablet Commonly known as: NITROSTAT Place 0.4 mg under the tongue as needed.   oxybutynin 10 MG 24 hr tablet Commonly known as: DITROPAN-XL Take 10 mg by mouth every morning.   polyethylene glycol 17 g packet Commonly known as: MIRALAX / GLYCOLAX Take 17 g by mouth daily as  needed. What changed: reasons to take this   predniSONE 5 MG tablet Commonly known as: DELTASONE Take 5 mg by mouth daily with breakfast.   psyllium 58.6 % packet Commonly known as: METAMUCIL Take 1 packet by mouth daily.   Rivaroxaban 15 MG Tabs tablet Commonly known as: XARELTO Take 15 mg by mouth every evening.   simvastatin 40 MG tablet Commonly known as: ZOCOR Take 20 mg by mouth every evening.   vitamin E 400 UNIT capsule Take 400 Units by mouth daily.      Follow-up Information    Gabriel Cirri, NP. Schedule an appointment as soon as possible for a visit in 2 week(s).   Specialty: Internal Medicine Contact information: [address] [facility] [place] [address] [phone]          No Known Allergies    Consultations:   None   Other Procedures/Studies: No results found.   TODAY-DAY OF DISCHARGE:  Subjective:   [name] today has no headache,no chest abdominal pain,no new weakness tingling or numbness, feels much better wants to go home today.   Objective:   Blood pressure (!) 143/68, pulse 81, temperature 98.2 F (36.8 C), temperature source Oral, resp. rate 18, height 4' 11.02" (1.499 m), weight 49.9 kg, SpO2 97 %.  Intake/Output Summary (Last 24 hours) at 07/06/2019 0951 Last data filed at 07/06/2019 0500 Gross per 24 hour  Intake 760 ml  Output 2550 ml  Net -1790 ml   Filed Weights   07/01/19 0601  Weight: 49.9 kg    Exam: Awake Alert, Oriented *3, No new F.N deficits, Normal affect [place].AT,PERRAL Supple Neck,No JVD, No cervical lymphadenopathy appriciated.  Symmetrical Chest wall movement, Good air movement bilaterally, CTAB RRR,No Gallops,Rubs or new Murmurs, No Parasternal Heave +ve B.Sounds, Abd Soft, Non tender, No organomegaly appriciated, No rebound -guarding or rigidity. No Cyanosis, Clubbing or edema, No new Rash or bruise   PERTINENT RADIOLOGIC STUDIES: No results  found.   PERTINENT LAB RESULTS: CBC: Recent Labs    07/04/19 0347 07/05/19 0415  WBC 8.8 6.6  HGB 11.4* 10.7*  HCT 34.1* 31.9*  PLT 368 418*   CMET CMP  Component Value Date/Time   NA 137 07/05/2019 0415   NA 140 02/21/2019 1501   NA 142 08/06/2014 1405   K 3.8 07/05/2019 0415   K 4.8 08/06/2014 1405   CL 102 07/05/2019 0415   CO2 26 07/05/2019 0415   CO2 28 08/06/2014 1405   GLUCOSE 81 07/05/2019 0415   GLUCOSE 110 08/06/2014 1405   BUN 28 (H) 07/05/2019 0415   BUN 31 (H) 02/21/2019 1501   BUN 23.3 08/06/2014 1405   CREATININE 0.67 07/05/2019 0415   CREATININE 0.99 (H) 05/24/2018 1440   CREATININE 0.9 08/06/2014 1405   CALCIUM 8.4 (L) 07/05/2019 0415   CALCIUM 9.8 08/06/2014 1405   PROT 5.8 (L) 07/05/2019 0415   PROT 6.5 02/21/2019 1501   PROT 7.8 08/06/2014 1405   ALBUMIN 2.5 (L) 07/05/2019 0415   ALBUMIN 4.1 02/21/2019 1501   ALBUMIN 3.5 08/06/2014 1405   AST 38 07/05/2019 0415   AST 25 08/06/2014 1405   ALT 29 07/05/2019 0415   ALT 16 08/06/2014 1405   ALKPHOS 38 07/05/2019 0415   ALKPHOS 49 08/06/2014 1405   BILITOT 0.7 07/05/2019 0415   BILITOT 0.2 02/21/2019 1501   BILITOT <0.20 08/06/2014 1405   GFRNONAA >60 07/05/2019 0415   GFRNONAA 52 (L) 05/24/2018 1440   GFRAA >60 07/05/2019 0415   GFRAA 61 05/24/2018 1440    GFR Estimated Creatinine Clearance: 35.1 mL/min (by C-G formula based on SCr of 0.67 mg/dL). No results for input(s): LIPASE, AMYLASE in the last 72 hours. No results for input(s): CKTOTAL, CKMB, CKMBINDEX, TROPONINI in the last 72 hours. Invalid input(s): POCBNP Recent Labs    07/04/19 0347 07/05/19 0415  DDIMER 2.06* 2.06*   No results for input(s): HGBA1C in the last 72 hours. No results for input(s): CHOL, HDL, LDLCALC, TRIG, CHOLHDL, LDLDIRECT in the last 72 hours. No results for input(s): TSH, T4TOTAL, T3FREE, THYROIDAB in the last 72 hours.  Invalid input(s): FREET3 Recent Labs    07/04/19 0347 07/05/19 0415   FERRITIN 298 291   Coags: No results for input(s): INR in the last 72 hours.  Invalid input(s): PT Microbiology: No results found for this or any previous visit (from the past 240 hour(s)).  FURTHER DISCHARGE INSTRUCTIONS:  Get Medicines reviewed and adjusted: Please take all your medications with you for your next visit with your Primary MD  Laboratory/radiological data: Please request your Primary MD to go over all hospital tests and procedure/radiological results at the follow up, please ask your Primary MD to get all Hospital records sent to his/her office.  In some cases, they will be blood work, cultures and biopsy results pending at the time of your discharge. Please request that your primary care M.D. goes through all the records of your hospital data and follows up on these results.  Also Note the following: If you experience worsening of your admission symptoms, develop shortness of breath, life threatening emergency, suicidal or homicidal thoughts you must seek medical attention immediately by calling 911 or calling your MD immediately  if symptoms less severe.  You must read complete instructions/literature along with all the possible adverse reactions/side effects for all the Medicines you take and that have been prescribed to you. Take any new Medicines after you have completely understood and accpet all the possible adverse reactions/side effects.   Do not drive when taking Pain medications or sleeping medications (Benzodaizepines)  Do not take more than prescribed Pain, Sleep and Anxiety Medications. It is not advisable to combine  anxiety,sleep and pain medications without talking with your primary care practitioner  Special Instructions: If you have smoked or chewed Tobacco  in the last 2 yrs please stop smoking, stop any regular Alcohol  and or any Recreational drug use.  Wear Seat belts while driving.  Please note: You were cared for by a hospitalist during your  hospital stay. Once you are discharged, your primary care physician will handle any further medical issues. Please note that NO REFILLS for any discharge medications will be authorized once you are discharged, as it is imperative that you return to your primary care physician (or establish a relationship with a primary care physician if you do not have one) for your post hospital discharge needs so that they can reassess your need for medications and monitor your lab values.  Total Time spent coordinating discharge including counseling, education and face to face time equals 35 minutes.  SignedOren Binet 07/06/2019 9:51 AM

## 2019-07-06 NOTE — TOC Transition Note (Signed)
Transition of Care [facility]) - CM/SW Discharge Note   Patient Details  Name: Batina Dougan MRN: [id number] Date of Birth: 01/16/1932  [facility] CM/SW Contact:  Ninfa Meeker, RN Phone Number: [phone] (working remotely) 07/06/2019, 10:56 AM   Clinical Narrative:   Patient will DC to: [facility] date: 07/06/19 Family notified: daughter Iantha Fallen Transport by: Veleta Miners  Patient is medically ready for discharge per MD. Charge nurse,, bedside RN and facility are aware of discharge plan. Case manager spoke with patient's daughter to update her. DC Summary sent to [facility] via the HUB, Medical Necessity and face sheet printed to nursing unit. Bedside RN to call report to [phone], patient going to Room 802A. Ambulance transport has been requested.     Final next level of care: [place] Barriers to Discharge: No Barriers Identified   Patient Goals and CMS Choice     Choice offered to / list presented to : Adult Children  Discharge Placement                       Discharge Plan and Services   Discharge Planning Services: CM Consult Post Acute Care Choice: Home Health, Resumption of Svcs/PTA Provider          DME Arranged: N/A(Has RW, 3in1, shower chair and ramp) DME Agency: NA       HH Arranged: RN, PT, OT HH Agency: [facility]        Social Determinants of Health (SDOH) Interventions     Readmission Risk Interventions No flowsheet data found.

## 2019-07-06 NOTE — TOC Progression Note (Signed)
Transition of Care [facility]) - Progression Note    Patient Details  Name: Zoe Robinson MRN: [id number] Date of Birth: Dec 26, 1931  Transition of Care [facility]) CM/SW Contact  Loletha Grayer Beverely Pace, RN Phone Number: 07/06/2019, 9:58 AM  Clinical Narrative:   Case manager contacted [place] to get Auth# for SNF. Auth # is [id number]. Will contact [place] with [facility] for bed information.     Expected Discharge Plan: [place] Barriers to Discharge: No Barriers Identified  Expected Discharge Plan and Services Expected Discharge Plan: [place]   Discharge Planning Services: CM Consult Post Acute Care Choice: Home Health, Resumption of Svcs/PTA Provider   Expected Discharge Date: 07/06/19               DME Arranged: N/A(Has RW, 3in1, shower chair and ramp) DME Agency: NA       HH Arranged: RN, PT, OT HH Agency: [facility]         Social Determinants of Health (SDOH) Interventions    Readmission Risk Interventions No flowsheet data found.

## 2019-07-06 NOTE — Discharge Instructions (Signed)
Person Under Monitoring Name: Zoe Robinson  Location: [place] [address]   Infection Prevention Recommendations for Individuals Confirmed to have, or Being Evaluated for, 2019 Novel Coronavirus (COVID-19) Infection Who Receive Care at Home  Individuals who are confirmed to have, or are being evaluated for, COVID-19 should follow the prevention steps below until a healthcare provider or local or state health department says they can return to normal activities.  Stay home except to get medical care You should restrict activities outside your home, except for getting medical care. Do not go to work, school, or public areas, and do not use public transportation or taxis.  Call ahead before visiting your doctor Before your medical appointment, call the healthcare provider and tell them that you have, or are being evaluated for, COVID-19 infection. This will help the healthcare provider's office take steps to keep other people from getting infected. Ask your healthcare provider to call the local or state health department.  Monitor your symptoms Seek prompt medical attention if your illness is worsening (e.g., difficulty breathing). Before going to your medical appointment, call the healthcare provider and tell them that you have, or are being evaluated for, COVID-19 infection. Ask your healthcare provider to call the local or state health department.  Wear a facemask You should wear a facemask that covers your nose and mouth when you are in the same room with other people and when you visit a healthcare provider. People who live with or visit you should also wear a facemask while they are in the same room with you.  Separate yourself from other people in your home As much as possible, you should stay in a different room from other people in your home. Also, you should use a separate bathroom, if available.  Avoid sharing household items You should not  share dishes, drinking glasses, cups, eating utensils, towels, bedding, or other items with other people in your home. After using these items, you should wash them thoroughly with soap and water.  Cover your coughs and sneezes Cover your mouth and nose with a tissue when you cough or sneeze, or you can cough or sneeze into your sleeve. Throw used tissues in a lined trash can, and immediately wash your hands with soap and water for at least 20 seconds or use an alcohol-based hand rub.  Wash your [facility] your hands often and thoroughly with soap and water for at least 20 seconds. You can use an alcohol-based hand sanitizer if soap and water are not available and if your hands are not visibly dirty. Avoid touching your eyes, nose, and mouth with unwashed hands.   Prevention Steps for Caregivers and Household Members of Individuals Confirmed to have, or Being Evaluated for, COVID-19 Infection Being Cared for in the Home  If you live with, or provide care at home for, a person confirmed to have, or being evaluated for, COVID-19 infection please follow these guidelines to prevent infection:  Follow healthcare provider's instructions Make sure that you understand and can help the patient follow any healthcare provider instructions for all care.  Provide for the patient's basic needs You should help the patient with basic needs in the home and provide support for getting groceries, prescriptions, and other personal needs.  Monitor the patient's symptoms If they are getting sicker, call his or her medical provider and tell them that the patient has, or is being evaluated for, COVID-19 infection. This will help the healthcare provider's  office take steps to keep other people from getting infected. Ask the healthcare provider to call the local or state health department.  Limit the number of people who have contact with the patient  If possible, have only one caregiver for the  patient.  Other household members should stay in another home or place of residence. If this is not possible, they should stay  in another room, or be separated from the patient as much as possible. Use a separate bathroom, if available.  Restrict visitors who do not have an essential need to be in the home.  Keep older adults, very young children, and other sick people away from the patient Keep older adults, very young children, and those who have compromised immune systems or chronic health conditions away from the patient. This includes people with chronic heart, lung, or kidney conditions, diabetes, and cancer.  Ensure good ventilation Make sure that shared spaces in the home have good air flow, such as from an air conditioner or an opened window, weather permitting.  Wash your hands often  Wash your hands often and thoroughly with soap and water for at least 20 seconds. You can use an alcohol based hand sanitizer if soap and water are not available and if your hands are not visibly dirty.  Avoid touching your eyes, nose, and mouth with unwashed hands.  Use disposable paper towels to dry your hands. If not available, use dedicated cloth towels and replace them when they become wet.  Wear a facemask and gloves  Wear a disposable facemask at all times in the room and gloves when you touch or have contact with the patient's blood, body fluids, and/or secretions or excretions, such as sweat, saliva, sputum, nasal mucus, vomit, urine, or feces.  Ensure the mask fits over your nose and mouth tightly, and do not touch it during use.  Throw out disposable facemasks and gloves after using them. Do not reuse.  Wash your hands immediately after removing your facemask and gloves.  If your personal clothing becomes contaminated, carefully remove clothing and launder. Wash your hands after handling contaminated clothing.  Place all used disposable facemasks, gloves, and other waste in a lined  container before disposing them with other household waste.  Remove gloves and wash your hands immediately after handling these items.  Do not share dishes, glasses, or other household items with the patient  Avoid sharing household items. You should not share dishes, drinking glasses, cups, eating utensils, towels, bedding, or other items with a patient who is confirmed to have, or being evaluated for, COVID-19 infection.  After the person uses these items, you should wash them thoroughly with soap and water.  Wash laundry thoroughly  Immediately remove and wash clothes or bedding that have blood, body fluids, and/or secretions or excretions, such as sweat, saliva, sputum, nasal mucus, vomit, urine, or feces, on them.  Wear gloves when handling laundry from the patient.  Read and follow directions on labels of laundry or clothing items and detergent. In general, wash and dry with the warmest temperatures recommended on the label.  Clean all areas the individual has used often  Clean all touchable surfaces, such as counters, tabletops, doorknobs, bathroom fixtures, toilets, phones, keyboards, tablets, and bedside tables, every day. Also, clean any surfaces that may have blood, body fluids, and/or secretions or excretions on them.  Wear gloves when cleaning surfaces the patient has come in contact with.  Use a diluted bleach solution (e.g., dilute bleach with 1  part bleach and 10 parts water) or a household disinfectant with a label that says EPA-registered for coronaviruses. To make a bleach solution at home, add 1 tablespoon of bleach to 1 quart (4 cups) of water. For a larger supply, add  cup of bleach to 1 gallon (16 cups) of water.  Read labels of cleaning products and follow recommendations provided on product labels. Labels contain instructions for safe and effective use of the cleaning product including precautions you should take when applying the product, such as wearing gloves or  eye protection and making sure you have good ventilation during use of the product.  Remove gloves and wash hands immediately after cleaning.  Monitor yourself for signs and symptoms of illness Caregivers and household members are considered close contacts, should monitor their health, and will be asked to limit movement outside of the home to the extent possible. Follow the monitoring steps for close contacts listed on the symptom monitoring form.   ? If you have additional questions, contact your local health department or call the epidemiologist on call at [phone] (available 24/7). ? This guidance is subject to change. For the most up-to-date guidance from [facility], please refer to their website: [email or web address].pl

## 2019-07-06 NOTE — Progress Notes (Signed)
Called report to [facility] spoke with fatmata, Therapist, sports.

## 2019-08-15 ENCOUNTER — Ambulatory Visit: Payer: Medicare Other | Admitting: Rheumatology

## 2019-09-07 NOTE — Progress Notes (Deleted)
Office Visit Note  Patient: Zoe Robinson             Date of Birth: 11-24-1931           MRN: [id number]             PCP: Gabriel Cirri, NP Referring: Gabriel Cirri, NP Visit Date: 09/13/2019 Occupation: @GUAROCC @  Subjective:  No chief complaint on file.   History of Present Illness: Zoe Robinson is a 86 y.o. female ***   Activities of Daily Living:  Patient reports morning stiffness for *** {minute/hour:19697}.   Patient {ACTIONS;DENIES/REPORTS:21021675::"Denies"} nocturnal pain.  Difficulty dressing/grooming: {ACTIONS;DENIES/REPORTS:21021675::"Denies"} Difficulty climbing stairs: {ACTIONS;DENIES/REPORTS:21021675::"Denies"} Difficulty getting out of chair: {ACTIONS;DENIES/REPORTS:21021675::"Denies"} Difficulty using hands for taps, buttons, cutlery, and/or writing: {ACTIONS;DENIES/REPORTS:21021675::"Denies"}  No Rheumatology ROS completed.   PMFS History:  Patient Active Problem List   Diagnosis Date Noted  . COVID-19 virus infection 06/30/2019  . High risk medication use 03/25/2017  . Rheumatoid arthritis involving multiple sites with positive rheumatoid factor (HCC) 03/15/2017  . Rheumatoid arthritis involving both knees with positive rheumatoid factor (HCC) 03/15/2017  . Rheumatoid arthritis involving both hands with positive rheumatoid factor (HCC) 03/15/2017  . Rheumatoid arthritis involving both feet with positive rheumatoid factor (HCC) 03/15/2017  . History of vertebral fracture 03/15/2017  . On prednisone therapy 02/10/2017  . Age-related osteoporosis without current pathological fracture 02/10/2017  . History of pulmonary hypertension 02/10/2017  . History of atrial fibrillation 02/10/2017  . History of anemia 02/10/2017  . History of recurrent UTIs 02/10/2017  . History of hypercalcemia 02/10/2017  . On anticoagulant therapy 02/10/2017  . Acute encephalopathy 08/19/2014  . Neutropenia (HCC) 04/20/2014  . Fever, unspecified 04/20/2014  .  Unspecified constipation 04/20/2014  . Hypercalcemia 04/15/2014  . Acute renal failure (HCC) 04/15/2014  . Generalized weakness 04/15/2014  . Dehydration 04/15/2014  . UTI (urinary tract infection) 04/15/2014  . Iron deficiency anemia 04/15/2014  . CKD (chronic kidney disease)   . Hypertension   . Atrial fibrillation [facility])     Past Medical History:  Diagnosis Date  . Arthritis   . Atrial fibrillation (HCC)   . CKD (chronic kidney disease)   . Hypertension   . RA (rheumatoid arthritis) (HCC)     Family History  Problem Relation Age of Onset  . Heart attack Mother   . Heart attack Father   . Heart attack Brother   . Heart attack Brother   . Stomach cancer Brother   . Cancer Sister   . Dementia Sister   . Osteoarthritis Daughter   . Depression Daughter   . Hypothyroidism Daughter   . Hypercholesterolemia Daughter   . Heart disease Son   . Hypertension Son   . Gout Son    Past Surgical History:  Procedure Laterality Date  . ABDOMINAL HYSTERECTOMY     Social History   Social History Narrative  . Not on file   Immunization History  Administered Date(s) Administered  . Influenza,inj,Quad PF,6+ Mos 04/19/2014  . Pneumococcal Conjugate-13 05/16/2015  . Pneumococcal Polysaccharide-23 10/25/2010, 04/17/2014  . Tdap 10/25/2010     Objective: Vital Signs: There were no vitals taken for this visit.   Physical Exam   Musculoskeletal Exam: ***  CDAI Exam: CDAI Score: -- Patient Global: --; Provider Global: -- Swollen: --; Tender: -- Joint Exam 09/13/2019   No joint exam has been documented for this visit   There is currently no information documented on the homunculus. Go to the Rheumatology activity and complete the homunculus joint  exam.  Investigation: No additional findings.  Imaging: No results found.  Recent Labs: Lab Results  Component Value Date   WBC 6.6 07/05/2019   HGB 10.7 (L) 07/05/2019   PLT 418 (H) 07/05/2019   NA 137 07/05/2019   K  3.8 07/05/2019   CL 102 07/05/2019   CO2 26 07/05/2019   GLUCOSE 81 07/05/2019   BUN 28 (H) 07/05/2019   CREATININE 0.67 07/05/2019   BILITOT 0.7 07/05/2019   ALKPHOS 38 07/05/2019   AST 38 07/05/2019   ALT 29 07/05/2019   PROT 5.8 (L) 07/05/2019   ALBUMIN 2.5 (L) 07/05/2019   CALCIUM 8.4 (L) 07/05/2019   GFRAA >60 07/05/2019    Speciality Comments: No specialty comments available.  Procedures:  No procedures performed Allergies: Patient has no known allergies.   Assessment / Plan:     Visit Diagnoses: No diagnosis found.  Orders: No orders of the defined types were placed in this encounter.  No orders of the defined types were placed in this encounter.   Face-to-face time spent with patient was *** minutes. Greater than 50% of time was spent in counseling and coordination of care.  Follow-Up Instructions: No follow-ups on file.   Ofilia Neas, PA-C  Note - This record has been created using Dragon software.  Chart creation errors have been sought, but may not always  have been located. Such creation errors do not reflect on  the standard of medical care.

## 2019-09-13 ENCOUNTER — Ambulatory Visit: Payer: Medicare Other | Admitting: Physician Assistant

## 2019-09-18 ENCOUNTER — Telehealth: Payer: Self-pay | Admitting: *Deleted

## 2019-09-18 NOTE — Telephone Encounter (Signed)
Received lab results drawn on 09/04/2019 Reviewed by Sherron Ales, PA-C   Hct 33.4 Hgb A1C 4.8  A/G Ratio 1.0 Albumin 3.5 BUN/Creat. Ratio 25.9 BUN 28 GFR 51  Patient on MTX 3 tabs weekly.

## 2019-10-25 ENCOUNTER — Other Ambulatory Visit: Payer: Self-pay | Admitting: *Deleted

## 2019-10-25 MED ORDER — METHOTREXATE 2.5 MG PO TABS: ORAL_TABLET | ORAL | 0 refills | Status: DC

## 2019-10-25 NOTE — Telephone Encounter (Signed)
Last Visit: 03/15/19 Next Visit: 11/07/19 Labs: 09/04/19 Hct 33.4 Hgb A1C 4.8  A/G Ratio 1.0 Albumin 3.5 BUN/Creat. Ratio 25.9 BUN 28 GFR 51  Okay to refill per Dr. Corliss Skains

## 2019-11-02 NOTE — Progress Notes (Signed)
Office Visit Note  Patient: Zoe Robinson             Date of Birth: 08-Jun-1932           MRN: [id number]             PCP: Gabriel Cirri, NP Referring: Gabriel Cirri, NP Visit Date: 11/07/2019 Occupation: @GUAROCC @  Subjective:  Medication monitoring   History of Present Illness: Zoe Robinson is a 86 y.o. female with history of seropositive rheumatoid arthritis and osteoporosis.  She denies any recent rheumatoid arthritis flares. She has intermittent pain in both hands but denies any joint swelling. She is taking methotrexate  She continues to take methotrexate 3 tablets by mouth once weekly and folic acid 1 mg po daily.  She is on long term prednisone 5 mg po daily. She receives prolia 60 mg sq injections every 6 months at her PCPs office.  She had her last injection was in January 2021.    Activities of Daily Living:  Patient reports morning stiffness for  0 none.   Patient Denies nocturnal pain.  Difficulty dressing/grooming: Reports Difficulty climbing stairs: Reports Difficulty getting out of chair: Reports Difficulty using hands for taps, buttons, cutlery, and/or writing: Reports  Review of Systems  Constitutional: Negative for fatigue.  HENT: Negative for mouth sores, mouth dryness and nose dryness.   Eyes: Negative for pain, visual disturbance and dryness.  Respiratory: Negative for cough, hemoptysis, shortness of breath and difficulty breathing.   Cardiovascular: Negative for chest pain, palpitations, hypertension and swelling in legs/feet.  Gastrointestinal: Negative for blood in stool, constipation and diarrhea.  Endocrine: Negative for increased urination.  Genitourinary: Negative for difficulty urinating and painful urination.  Musculoskeletal: Positive for arthralgias, gait problem, joint pain, joint swelling and muscle weakness. Negative for myalgias, morning stiffness, muscle tenderness and myalgias.  Skin: Negative for color change, pallor, rash, hair  loss, nodules/bumps, skin tightness, ulcers and sensitivity to sunlight.  Allergic/Immunologic: Negative for susceptible to infections.  Neurological: Positive for weakness. Negative for dizziness, numbness and headaches.  Hematological: Positive for bruising/bleeding tendency. Negative for swollen glands.  Psychiatric/Behavioral: Negative for depressed mood and sleep disturbance. The patient is not nervous/anxious.     PMFS History:  Patient Active Problem List   Diagnosis Date Noted  . COVID-19 virus infection 06/30/2019  . High risk medication use 03/25/2017  . Rheumatoid arthritis involving multiple sites with positive rheumatoid factor (HCC) 03/15/2017  . Rheumatoid arthritis involving both knees with positive rheumatoid factor (HCC) 03/15/2017  . Rheumatoid arthritis involving both hands with positive rheumatoid factor (HCC) 03/15/2017  . Rheumatoid arthritis involving both feet with positive rheumatoid factor (HCC) 03/15/2017  . History of vertebral fracture 03/15/2017  . On prednisone therapy 02/10/2017  . Age-related osteoporosis without current pathological fracture 02/10/2017  . History of pulmonary hypertension 02/10/2017  . History of atrial fibrillation 02/10/2017  . History of anemia 02/10/2017  . History of recurrent UTIs 02/10/2017  . History of hypercalcemia 02/10/2017  . On anticoagulant therapy 02/10/2017  . Acute encephalopathy 08/19/2014  . Neutropenia (HCC) 04/20/2014  . Fever, unspecified 04/20/2014  . Unspecified constipation 04/20/2014  . Hypercalcemia 04/15/2014  . Acute renal failure (HCC) 04/15/2014  . Generalized weakness 04/15/2014  . Dehydration 04/15/2014  . UTI (urinary tract infection) 04/15/2014  . Iron deficiency anemia 04/15/2014  . CKD (chronic kidney disease)   . Hypertension   . Atrial fibrillation [facility])     Past Medical History:  Diagnosis Date  . Arthritis   .  Atrial fibrillation (HCC)   . CKD (chronic kidney disease)   .  Hypertension   . RA (rheumatoid arthritis) (HCC)     Family History  Problem Relation Age of Onset  . Heart attack Mother   . Heart attack Father   . Heart attack Brother   . Heart attack Brother   . Stomach cancer Brother   . Cancer Sister   . Dementia Sister   . Osteoarthritis Daughter   . Depression Daughter   . Hypothyroidism Daughter   . Hypercholesterolemia Daughter   . Heart disease Son   . Hypertension Son   . Gout Son    Past Surgical History:  Procedure Laterality Date  . ABDOMINAL HYSTERECTOMY     Social History   Social History Narrative  . Not on file   Immunization History  Administered Date(s) Administered  . Influenza,inj,Quad PF,6+ Mos 04/19/2014  . Pneumococcal Conjugate-13 05/16/2015  . Pneumococcal Polysaccharide-23 10/25/2010, 04/17/2014  . Tdap 10/25/2010     Objective: Vital Signs: BP (!) 124/55 (BP Location: Left Arm, Patient Position: Sitting, Cuff Size: Normal)   Pulse 68   Resp 20   Ht 4\' 11"  (1.499 m)   Wt 116 lb (52.6 kg)   BMI 23.43 kg/m    Physical Exam Vitals and nursing note reviewed.  Constitutional:      Appearance: She is well-developed.  HENT:     Head: Normocephalic and atraumatic.  Eyes:     Conjunctiva/sclera: Conjunctivae normal.  Pulmonary:     Effort: Pulmonary effort is normal.  Abdominal:     General: Bowel sounds are normal.     Palpations: Abdomen is soft.  Musculoskeletal:     Cervical back: Normal range of motion.  Lymphadenopathy:     Cervical: No cervical adenopathy.  Skin:    General: Skin is warm and dry.     Capillary Refill: Capillary refill takes less than 2 seconds.  Neurological:     Mental Status: She is alert and oriented to person, place, and time.  Psychiatric:        Behavior: Behavior normal.      Musculoskeletal Exam:  C-spine good ROM.  Thoracic kyphosis noted.  Shoulder joints good ROM.  Elbow joints contractures noted. Wrist joints limited ROM.  Ulnar deviation bilaterally.   Swan neck deformities noted.  Synovial thickening over MCP joints but no synovitis noted.  Knee joints good ROM with no discomfort. Overcrowding and hammertoes noted bilaterally.    CDAI Exam: CDAI Score: -- Patient Global: 2 mm; Provider Global: -- Swollen: 0 ; Tender: 0  Joint Exam 11/07/2019   No joint exam has been documented for this visit   There is currently no information documented on the homunculus. Go to the Rheumatology activity and complete the homunculus joint exam.  Investigation: No additional findings.  Imaging: No results found.  Recent Labs: Lab Results  Component Value Date   WBC 6.6 07/05/2019   HGB 10.7 (L) 07/05/2019   PLT 418 (H) 07/05/2019   NA 137 07/05/2019   K 3.8 07/05/2019   CL 102 07/05/2019   CO2 26 07/05/2019   GLUCOSE 81 07/05/2019   BUN 28 (H) 07/05/2019   CREATININE 0.67 07/05/2019   BILITOT 0.7 07/05/2019   ALKPHOS 38 07/05/2019   AST 38 07/05/2019   ALT 29 07/05/2019   PROT 5.8 (L) 07/05/2019   ALBUMIN 2.5 (L) 07/05/2019   CALCIUM 8.4 (L) 07/05/2019   GFRAA >60 07/05/2019    Speciality Comments:  No specialty comments available.  Procedures:  No procedures performed Allergies: Patient has no known allergies.   Assessment / Plan:     Visit Diagnoses: Rheumatoid arthritis involving multiple sites with positive rheumatoid factor ([place]): She has no synovitis on exam.  She has not had any recent rheumatoid arthritis flares.  She is clinically doing well on methotrexate 3 tablets by mouth once weekly, folic acid 1 mg by mouth daily, and prednisone 5 mg by mouth daily.  She has severe ulnar deviation and synovial thickening of MCP joints.  Swan-neck deformities noted.  She has very limited range of motion of both wrist joints and contractures in bilateral elbow joints.  She experiences intermittent discomfort in her hands but has no active inflammation at this time.  She will continue taking methotrexate, folic acid, and prednisone as  prescribed.  She does not need any refills at this time.  She will have upcoming lab work drawn at her PCPs office in May 2021 and have the results faxed to our office.  She was advised to notify us if she develops increased joint pain or joint swelling.  She will follow-up in the office in 5 months.  High risk medication use - MTX 3 tablets weekly, folic acid 1 mg po daily, and long term prednisone 5 mg po daily.  CBC and CMP were drawn on 09/04/19.  She will be due to update lab work in May and has an upcoming appointment with her PCP and will have labs faxed to our office once complete.   On prednisone therapy - Prednisone 5 mg po daily. She receives prolia 60 mg sq injections every 6 months.   Ulcer of left lower extremity, unspecified ulcer stage [facility]): Resolved   Age-related osteoporosis without current pathological fracture: Thoracic kyphosis noted.  History of vertebral fractures.  No midline spinal tenderness today.  She has not had any recent falls or fractures. Her PCP orders DEXA scans. She is currently on prolia 60 mg sq injections every 6 months.  Her last injection was in January 2021 according to her daughter.    History of vertebral fracture: Thoracic kyphosis noted.  No midline spinal tenderness.    Other medical conditions are listed as follows:   On anticoagulant therapy  History of atrial fibrillation  History of pulmonary hypertension  Hypercalcemia  Orders: No orders of the defined types were placed in this encounter.  No orders of the defined types were placed in this encounter.    Follow-Up Instructions: Return in about 5 months (around 04/08/2020) for Rheumatoid arthritis, Osteoarthritis.   Ofilia Neas, PA-C  Note - This record has been created using Dragon software.  Chart creation errors have been sought, but may not always  have been located. Such creation errors do not reflect on  the standard of medical care.

## 2019-11-07 ENCOUNTER — Encounter: Payer: Self-pay | Admitting: Physician Assistant

## 2019-11-07 ENCOUNTER — Ambulatory Visit: Payer: Medicare Other | Admitting: Physician Assistant

## 2019-11-07 ENCOUNTER — Other Ambulatory Visit: Payer: Self-pay

## 2019-11-07 VITALS — BP 124/55 | HR 68 | Resp 20 | Ht 59.0 in | Wt 116.0 lb

## 2019-11-07 DIAGNOSIS — Z8679 Personal history of other diseases of the circulatory system: Secondary | ICD-10-CM

## 2019-11-07 DIAGNOSIS — Z7952 Long term (current) use of systemic steroids: Secondary | ICD-10-CM | POA: Diagnosis not present

## 2019-11-07 DIAGNOSIS — Z79899 Other long term (current) drug therapy: Secondary | ICD-10-CM

## 2019-11-07 DIAGNOSIS — M810 Age-related osteoporosis without current pathological fracture: Secondary | ICD-10-CM

## 2019-11-07 DIAGNOSIS — M0579 Rheumatoid arthritis with rheumatoid factor of multiple sites without organ or systems involvement: Secondary | ICD-10-CM

## 2019-11-07 DIAGNOSIS — L97929 Non-pressure chronic ulcer of unspecified part of left lower leg with unspecified severity: Secondary | ICD-10-CM

## 2019-11-07 DIAGNOSIS — Z7901 Long term (current) use of anticoagulants: Secondary | ICD-10-CM

## 2019-11-07 DIAGNOSIS — Z8781 Personal history of (healed) traumatic fracture: Secondary | ICD-10-CM

## 2019-11-07 NOTE — Patient Instructions (Signed)
Standing Labs We placed an order today for your standing lab work.    Please come back and get your standing labs in May and every 3 months   We have open lab daily Monday through Thursday from 8:30-12:30 PM and 1:30-4:30 PM and Friday from 8:30-12:30 PM and 1:30-4:00 PM at the office of Dr. Shaili Deveshwar.   You may experience shorter wait times on Monday and Friday afternoons. The office is located at [address], [address], [place], [place] [address] No appointment is necessary.   Labs are drawn by [facility].  You may receive a bill from [facility] for your lab work.  If you wish to have your labs drawn at another location, please call the office 24 hours in advance to send orders.  If you have any questions regarding directions or hours of operation,  please call [phone].   Just as a reminder please drink plenty of water prior to coming for your lab work. Thanks!   

## 2019-11-29 ENCOUNTER — Encounter: Payer: Self-pay | Admitting: Neurology

## 2019-12-27 NOTE — Progress Notes (Deleted)
Assessment/Plan:    ***  Subjective:   Zoe Robinson was seen today in the movement disorders clinic for neurologic consultation at the request of Hortencia Conradi, NP.  The consultation is for the evaluation of Parkinson's disease.  Patient apparently had an insurance physical and the nurse that came to the house saw hand shaking and told them to get an evaluation for Parkinson's.  She presents today for that evaluation.   Specific Symptoms:  Tremor: {yes no:314532} Family hx of similar:  {yes no:314532} Voice: *** Sleep: ***  Vivid Dreams:  {yes no:314532}  Acting out dreams:  {yes no:314532} Wet Pillows: {yes no:314532} Postural symptoms:  {yes no:314532}  Falls?  {yes no:314532} Bradykinesia symptoms: {parkinson brady:18041} Loss of smell:  {yes no:314532} Loss of taste:  {yes no:314532} Urinary Incontinence:  {yes no:314532} Difficulty Swallowing:  {yes no:314532} Handwriting, micrographia: {yes no:314532} Trouble with ADL's:  {yes no:314532}  Trouble buttoning clothing: {yes no:314532} Depression:  {yes no:314532} Memory changes:  {yes no:314532} Hallucinations:  {yes no:314532}  visual distortions: {yes no:314532} N/V:  {yes no:314532} Lightheaded:  {yes no:314532}  Syncope: {yes no:314532} Diplopia:  {yes no:314532} Dyskinesia:  {yes no:314532} Prior exposure to reglan/antipsychotics: {yes no:314532}  Neuroimaging of the brain has *** previously been performed.  It *** available for my review today.  CT of the brain that was performed in 2016 demonstrated chronic atrophy and small vessel disease.  PREVIOUS MEDICATIONS: {Parkinson's RX:18200}  ALLERGIES:  No Known Allergies  CURRENT MEDICATIONS:  Current Outpatient Medications  Medication Instructions  . acetaminophen (TYLENOL) 1,000 mg, Oral, As needed  . Cranberry 500 MG TABS Oral, 2 times daily  . denosumab (PROLIA) 60 mg, Subcutaneous, Every 6 months, Administer in upper arm, thigh, or abdomen  .  donepezil (ARICEPT) 10 mg, Oral, Daily at bedtime  . feeding supplement, ENSURE COMPLETE, (ENSURE COMPLETE) LIQD 237 mLs, Oral, 3 times daily between meals  . ferrous sulfate 325 mg, Daily with breakfast  . folic acid (FOLVITE) 1 mg, Oral, BH-each morning  . irbesartan (AVAPRO) 300 MG tablet TK 1 T PO QD  . Lactobacillus TABS Oral, Daily  . magnesium gluconate (MAGONATE) 250 mg, Oral, BH-each morning  . methotrexate (RHEUMATREX) 2.5 MG tablet TAKE 3 TABLETS BY MOUTH 1 TIME WEEKLY  . mirabegron ER (MYRBETRIQ) 50 mg, Oral, Daily  . Multiple Vitamins-Minerals (MULTIVITAMIN PO) 1 tablet, Oral, BH-each morning  . nitroGLYCERIN (NITROSTAT) 0.4 mg, Sublingual, As needed  . oxybutynin (DITROPAN-XL) 10 mg, Oral, BH-each morning  . polyethylene glycol (MIRALAX / GLYCOLAX) 17 g, Oral, Daily PRN  . predniSONE (DELTASONE) 5 mg, Oral, Daily with breakfast  . Rivaroxaban (XARELTO) 15 mg, Oral, Every evening  . simvastatin (ZOCOR) 20 mg, Oral, Every evening  . vitamin E 400 Units, Oral, Daily  . Xarelto 20 mg, Oral, Daily    Objective:   VITALS:  There were no vitals filed for this visit.  GEN:  The patient appears stated age and is in NAD. HEENT:  Normocephalic, atraumatic.  The mucous membranes are moist. The superficial temporal arteries are without ropiness or tenderness. CV:  RRR Lungs:  CTAB Neck/HEME:  There are no carotid bruits bilaterally.  Neurological examination:  Orientation: The patient is alert and oriented x3.  Cranial nerves: There is good facial symmetry. Extraocular muscles are intact. The visual fields are full to confrontational testing. The speech is fluent and clear. Soft palate rises symmetrically and there is no tongue deviation. Hearing is intact to conversational tone. Sensation: Sensation  is intact to light and pinprick throughout (facial, trunk, extremities). Vibration is intact at the bilateral big toe. There is no extinction with double simultaneous stimulation.  There is no sensory dermatomal level identified. Motor: Strength is 5/5 in the bilateral upper and lower extremities.   Shoulder shrug is equal and symmetric.  There is no pronator drift. Deep tendon reflexes: Deep tendon reflexes are 2/4 at the bilateral biceps, triceps, brachioradialis, patella and achilles. Plantar responses are downgoing bilaterally.  Movement examination: Tone: There is ***tone in the bilateral upper extremities.  The tone in the lower extremities is ***.  Abnormal movements: *** Coordination:  There is *** decremation with RAM's, *** Gait and Station: The patient has *** difficulty arising out of a deep-seated chair without the use of the hands. The patient's stride length is ***.  The patient has a *** pull test.     I have reviewed and interpreted the following labs independently   Chemistry      Component Value Date/Time   NA 137 07/05/2019 0415   NA 140 02/21/2019 1501   NA 142 08/06/2014 1405   K 3.8 07/05/2019 0415   K 4.8 08/06/2014 1405   CL 102 07/05/2019 0415   CO2 26 07/05/2019 0415   CO2 28 08/06/2014 1405   BUN 28 (H) 07/05/2019 0415   BUN 31 (H) 02/21/2019 1501   BUN 23.3 08/06/2014 1405   CREATININE 0.67 07/05/2019 0415   CREATININE 0.99 (H) 05/24/2018 1440   CREATININE 0.9 08/06/2014 1405      Component Value Date/Time   CALCIUM 8.4 (L) 07/05/2019 0415   CALCIUM 9.8 08/06/2014 1405   ALKPHOS 38 07/05/2019 0415   ALKPHOS 49 08/06/2014 1405   AST 38 07/05/2019 0415   AST 25 08/06/2014 1405   ALT 29 07/05/2019 0415   ALT 16 08/06/2014 1405   BILITOT 0.7 07/05/2019 0415   BILITOT 0.2 02/21/2019 1501   BILITOT <0.20 08/06/2014 1405      Lab Results  Component Value Date   TSH 1.28 02/14/2017   Lab Results  Component Value Date   WBC 6.6 07/05/2019   HGB 10.7 (L) 07/05/2019   HCT 31.9 (L) 07/05/2019   MCV 87.4 07/05/2019   PLT 418 (H) 07/05/2019     Total time spent on today's visit was ***60 minutes, including both  face-to-face time and nonface-to-face time.  Time included that spent on review of records (prior notes available to me/labs/imaging if pertinent), discussing treatment and goals, answering patient's questions and coordinating care.  Cc:  Darlis Loan, NP

## 2019-12-31 ENCOUNTER — Ambulatory Visit: Payer: Medicare Other | Admitting: Neurology

## 2019-12-31 ENCOUNTER — Other Ambulatory Visit: Payer: Self-pay

## 2020-01-16 ENCOUNTER — Other Ambulatory Visit: Payer: Self-pay | Admitting: *Deleted

## 2020-01-16 MED ORDER — METHOTREXATE 2.5 MG PO TABS: 7.5000 mg | ORAL_TABLET | ORAL | 0 refills | Status: DC

## 2020-01-16 NOTE — Telephone Encounter (Signed)
Refill request received via fax  Last Visit: 11/07/2019 Next visit: 04/10/2020 Labs: 09/04/19 Hct 33.4 Hgb A1C 4.8  A/G Ratio 1.0 Albumin 3.5 BUN/Creat. Ratio 25.9 BUN 28 GFR 51  Advised patient's daughter that the patient is due to update labs. She states patient has an appointment on 01/31/2020 with PCP. She is due to have labs at that appointment.   Okay to refill 30 day supply MTX?

## 2020-01-30 ENCOUNTER — Encounter: Payer: Self-pay | Admitting: Neurology

## 2020-01-30 NOTE — Progress Notes (Signed)
Assessment/Plan:    1.  Tremor  -Explained to the patient and her daughter that she does not meet [place] brain [facility] criteria or modified MDS criteria for the diagnosis of Parkinson's disease.  Patient may have a component of essential tremor, but also has features of functional tremor.  We discussed this today.  They really wanted no treatment.  Talked about the value of occupational therapy, but they wanted to hold.  They were interested in weighted spoon/fork/gloves and discussed this today.  Discussed the role that stress plays into tremor.  Discussed that if the etiology of stress can be uncovered, then tremor could certainly get better.  Discussed that it is not generally superficial stress that contributes to this type of tremor.  Patient and daughter understood.  2.  F/u prn  Subjective:   Zoe Robinson was seen today in the movement disorders clinic for neurologic consultation at the request of Gabriel Cirri, NP.  The consultation is for the evaluation of Parkinson's disease.  Patient apparently had an insurance physical and the nurse that came to the house saw hand shaking and told them to get an evaluation for Parkinson's.  She presents today for that evaluation.  This patient is accompanied in the office by her daughter who supplements the history.   Specific Symptoms:  Tremor: Yes.  , 5+ years.  R hand only.  Intermittent.  Is there at rest and with activation.  She is R hand dominant.  Denies in L hand/leg or R leg.  Sister had tremor with unknown dx.  States that she cannot stop it by thinking about it but can by grabbing it with the other hand.   Voice: deeper and softer Sleep: sleeps well Wet Pillows: No. Postural symptoms:  No. per pt but daughter states "age related"  Falls?  Yes.   , last fall about 3 years ago Bradykinesia symptoms: slow movements Loss of smell:  No. Loss of taste:  No. Urinary Incontinence:  Yes.   - wears undergarments x 5 years Difficulty  Swallowing:  No. per pt but intermittent trouble per daughter Trouble with ADL's:  No.  Trouble buttoning clothing: Yes.   due to the RA in the hands Depression:  No. Memory changes:  No. per pt but daughter states that "she takes memory medication."  Husband does finances and drives.  They have meals on wheels and eat out some.  N/V:  No. Lightheaded:  No.  Syncope: No. Diplopia:  No. Dyskinesia:  No. Prior exposure to reglan/antipsychotics: No.   CT of the brain that was performed in 2016 demonstrated chronic atrophy and small vessel disease.    ALLERGIES:  No Known Allergies  CURRENT MEDICATIONS:  Current Outpatient Medications  Medication Instructions  . acetaminophen (TYLENOL) 1,000 mg, Oral, As needed  . Cranberry 500 MG TABS 1 tablet, Oral, Daily  . denosumab (PROLIA) 60 mg, Subcutaneous, Every 6 months, Administer in upper arm, thigh, or abdomen  . donepezil (ARICEPT) 10 mg, Oral, Daily at bedtime  . feeding supplement, ENSURE COMPLETE, (ENSURE COMPLETE) LIQD 237 mLs, Oral, 3 times daily between meals  . ferrous sulfate 325 mg, Daily with breakfast  . folic acid (FOLVITE) 1 mcg, Oral, Daily  . irbesartan (AVAPRO) 300 MG tablet TK 1 T PO QD  . Lactobacillus TABS Oral, Daily  . Magnesium 500 MG TABS 1 capsule, Oral, Daily  . methotrexate (RHEUMATREX) 7.5 mg, Oral, Weekly  . mirabegron ER (MYRBETRIQ) 50 mg, Oral, Daily  . Multiple  Vitamins-Minerals (MULTIVITAMIN PO) 1 tablet, Oral, BH-each morning  . nitroGLYCERIN (NITROSTAT) 0.4 mg, Sublingual, As needed  . oxybutynin (DITROPAN-XL) 10 mg, Oral, BH-each morning  . polyethylene glycol (MIRALAX / GLYCOLAX) 17 g, Oral, Daily PRN  . Potassium Chloride ER 20 MEQ TBCR 20 mEq, Oral, Daily  . predniSONE (DELTASONE) 5 mg, Oral, Daily with breakfast  . psyllium (METAMUCIL) 58.6 % packet 1 packet, Oral, Daily  . simvastatin (ZOCOR) 40 mg, Oral, Daily  . vitamin E 400 Units, Oral, Daily  . Xarelto 20 mg, Oral, Daily     Objective:   VITALS:   Vitals:   02/04/20 1304  Weight: 120 lb (54.4 kg)    GEN:  The patient appears stated age and is in NAD. HEENT:  Normocephalic, atraumatic.  The mucous membranes are moist. The superficial temporal arteries are without ropiness or tenderness. CV:  RRR Lungs:  CTAB Neck/HEME:  There are no carotid bruits bilaterally. Musculoskeletal: Patient's hands are deformed due to rheumatoid arthritis.  Neurological examination:  Orientation: The patient is alert and oriented x3.  Cranial nerves: There is good facial symmetry. Extraocular muscles are intact. The visual fields are full to confrontational testing. The speech is fluent and clear. Soft palate rises symmetrically and there is no tongue deviation. Hearing is intact to conversational tone. Sensation: Sensation is intact to light and pinprick throughout (facial, trunk, extremities). Vibration is intact at the bilateral big toe. There is no extinction with double simultaneous stimulation. There is no sensory dermatomal level identified. Motor: Strength is 5/5 in the bilateral upper and lower extremities.   Shoulder shrug is equal and symmetric.  There is no pronator drift. Deep tendon reflexes: Deep tendon reflexes are 2/4 at the bilateral biceps, triceps, brachioradialis, patella and achilles. Plantar responses are downgoing bilaterally.  Movement examination: Tone: There is tone in the bilateral upper extremities.  The tone in the lower extremities is normal.  Abnormal movements: there is RUE and bilateral LE tremor that is better with distraction.  The RUE rest tremor has variable amplitude and frequency.    When asked to tap out a rhythm with one hand, the frequency of the tremor in the other hand matches the rhythm asked to be tapped out.  There is some intention tremor bilaterally. Coordination:  There is no decremation with RAM's Gait and Station: The patient has  difficulty arising out of a deep-seated chair  without the use of the hands. The patient's stride length is just slightly decreased.   I have reviewed and interpreted the following labs independently   Chemistry      Component Value Date/Time   NA 137 07/05/2019 0415   NA 140 02/21/2019 1501   NA 142 08/06/2014 1405   K 3.8 07/05/2019 0415   K 4.8 08/06/2014 1405   CL 102 07/05/2019 0415   CO2 26 07/05/2019 0415   CO2 28 08/06/2014 1405   BUN 28 (H) 07/05/2019 0415   BUN 31 (H) 02/21/2019 1501   BUN 23.3 08/06/2014 1405   CREATININE 0.67 07/05/2019 0415   CREATININE 0.99 (H) 05/24/2018 1440   CREATININE 0.9 08/06/2014 1405      Component Value Date/Time   CALCIUM 8.4 (L) 07/05/2019 0415   CALCIUM 9.8 08/06/2014 1405   ALKPHOS 38 07/05/2019 0415   ALKPHOS 49 08/06/2014 1405   AST 38 07/05/2019 0415   AST 25 08/06/2014 1405   ALT 29 07/05/2019 0415   ALT 16 08/06/2014 1405   BILITOT 0.7 07/05/2019 0415  BILITOT 0.2 02/21/2019 1501   BILITOT <0.20 08/06/2014 1405      Lab Results  Component Value Date   TSH 1.28 02/14/2017   Lab Results  Component Value Date   WBC 6.6 07/05/2019   HGB 10.7 (L) 07/05/2019   HCT 31.9 (L) 07/05/2019   MCV 87.4 07/05/2019   PLT 418 (H) 07/05/2019     Total time spent on today's visit was 45 minutes, including both face-to-face time and nonface-to-face time.  Time included that spent on review of records (prior notes available to me/labs/imaging if pertinent), discussing treatment and goals, answering patient's questions and coordinating care.  Cc:  Gabriel Cirri, NP

## 2020-02-04 ENCOUNTER — Ambulatory Visit: Payer: Medicare Other | Admitting: Neurology

## 2020-02-04 ENCOUNTER — Other Ambulatory Visit: Payer: Self-pay

## 2020-02-04 ENCOUNTER — Encounter: Payer: Self-pay | Admitting: Neurology

## 2020-02-04 VITALS — BP 127/66 | HR 80 | Ht 59.0 in | Wt 120.0 lb

## 2020-02-04 DIAGNOSIS — R251 Tremor, unspecified: Secondary | ICD-10-CM

## 2020-02-04 NOTE — Patient Instructions (Signed)
You can try the weighted gloves from [facility].  Let us know if you want an occupational therapy evaluation.    The physicians and staff at [facility] are committed to providing excellent care. You may receive a survey requesting feedback about your experience at our office. We strive to receive "very good" responses to the survey questions. If you feel that your experience would prevent you from giving the office a "very good " response, please contact our office to try to remedy the situation. We may be reached at [phone]. Thank you for taking the time out of your busy day to complete the survey.

## 2020-02-11 ENCOUNTER — Other Ambulatory Visit: Payer: Self-pay | Admitting: Rheumatology

## 2020-02-22 ENCOUNTER — Other Ambulatory Visit: Payer: Self-pay | Admitting: Rheumatology

## 2020-02-26 ENCOUNTER — Telehealth: Payer: Self-pay | Admitting: Rheumatology

## 2020-02-26 NOTE — Telephone Encounter (Signed)
Patient's daughter-in-law Lupita Leash called requesting prescription refill of Methotrexate to be sent to [facility] at [employer] [address] in [place].  Lupita Leash states she called the pharmacy last week for the refill and patient is due to take 3 tablets tomorrow.   Lupita Leash states she will be out this morning and would like to pick up the prescription ASAP.  [phone]

## 2020-02-26 NOTE — Telephone Encounter (Signed)
Spoke with Lupita Leash and advised we are unable to have the prescription filled at this time because patient is due for labs. She states she will contact [name] and advise her to have the patient get labs done.

## 2020-03-05 ENCOUNTER — Other Ambulatory Visit: Payer: Self-pay | Admitting: *Deleted

## 2020-03-05 MED ORDER — METHOTREXATE 2.5 MG PO TABS: 7.5000 mg | ORAL_TABLET | ORAL | 0 refills | Status: DC

## 2020-03-05 NOTE — Telephone Encounter (Signed)
Last Visit: 11/07/2019 Next visit: 04/10/2020 Labs: 02/28/2020 BUN/Creat. Ratio 29.8, BUN 34, Carbon Dioxide 34, Glucose 121 GFR 48  Current Dose per office note 11/07/2019: MTX 3 tablets weekly  Okay to refill MTX?

## 2020-03-06 ENCOUNTER — Telehealth: Payer: Self-pay | Admitting: *Deleted

## 2020-03-06 NOTE — Telephone Encounter (Signed)
Labs received from PCP Drawn on 02/27/2020 Reviewed by Dr. Leonia Reader. Ratio 29.8 BUN 34 Carbon Dioxide 34 Glucose 121 Free T3 2.01 RBC 3.61 Hct 32.4  Patient on MTX 3 tabs by mouth weekly.

## 2020-04-01 NOTE — Progress Notes (Deleted)
Office Visit Note  Patient: Zoe Robinson             Date of Birth: 1931/08/30           MRN: [id number]             PCP: Gabriel Cirri, NP Referring: Gabriel Cirri, NP Visit Date: 04/10/2020 Occupation: @GUAROCC @  Subjective:  No chief complaint on file.   History of Present Illness: Zoe Robinson is a 86 y.o. female ***   Activities of Daily Living:  Patient reports morning stiffness for *** {minute/hour:19697}.   Patient {ACTIONS;DENIES/REPORTS:21021675::"Denies"} nocturnal pain.  Difficulty dressing/grooming: {ACTIONS;DENIES/REPORTS:21021675::"Denies"} Difficulty climbing stairs: {ACTIONS;DENIES/REPORTS:21021675::"Denies"} Difficulty getting out of chair: {ACTIONS;DENIES/REPORTS:21021675::"Denies"} Difficulty using hands for taps, buttons, cutlery, and/or writing: {ACTIONS;DENIES/REPORTS:21021675::"Denies"}  No Rheumatology ROS completed.   PMFS History:  Patient Active Problem List   Diagnosis Date Noted  . COVID-19 virus infection 06/30/2019  . High risk medication use 03/25/2017  . Rheumatoid arthritis involving multiple sites with positive rheumatoid factor (HCC) 03/15/2017  . Rheumatoid arthritis involving both knees with positive rheumatoid factor (HCC) 03/15/2017  . Rheumatoid arthritis involving both hands with positive rheumatoid factor (HCC) 03/15/2017  . Rheumatoid arthritis involving both feet with positive rheumatoid factor (HCC) 03/15/2017  . History of vertebral fracture 03/15/2017  . On prednisone therapy 02/10/2017  . Age-related osteoporosis without current pathological fracture 02/10/2017  . History of pulmonary hypertension 02/10/2017  . History of atrial fibrillation 02/10/2017  . History of anemia 02/10/2017  . History of recurrent UTIs 02/10/2017  . History of hypercalcemia 02/10/2017  . On anticoagulant therapy 02/10/2017  . Acute encephalopathy 08/19/2014  . Neutropenia (HCC) 04/20/2014  . Fever, unspecified 04/20/2014  .  Unspecified constipation 04/20/2014  . Hypercalcemia 04/15/2014  . Acute renal failure (HCC) 04/15/2014  . Generalized weakness 04/15/2014  . Dehydration 04/15/2014  . UTI (urinary tract infection) 04/15/2014  . Iron deficiency anemia 04/15/2014  . CKD (chronic kidney disease)   . Hypertension   . Atrial fibrillation [facility])     Past Medical History:  Diagnosis Date  . Arthritis   . Atrial fibrillation (HCC)   . Chronic atrial fibrillation (HCC)   . CKD (chronic kidney disease)   . Hypertension   . Hypothyroidism   . Incontinence   . RA (rheumatoid arthritis) (HCC)     Family History  Problem Relation Age of Onset  . Heart attack Mother   . Cancer Mother   . Hypertension Mother   . Heart disease Mother   . Heart attack Father   . Stomach cancer Father   . Hypertension Father   . Heart disease Father   . Heart attack Brother   . Heart attack Brother   . Stomach cancer Brother   . Cancer Sister   . Dementia Sister   . Osteoarthritis Daughter   . Depression Daughter   . Hypothyroidism Daughter   . Hypercholesterolemia Daughter   . Heart disease Son   . Hypertension Son   . Gout Son    Past Surgical History:  Procedure Laterality Date  . ABDOMINAL HYSTERECTOMY  1990   Social History   Social History Narrative  . Not on file   Immunization History  Administered Date(s) Administered  . Influenza,inj,Quad PF,6+ Mos 04/19/2014  . Pneumococcal Conjugate-13 05/16/2015  . Pneumococcal Polysaccharide-23 10/25/2010, 04/17/2014  . Tdap 10/25/2010     Objective: Vital Signs: There were no vitals taken for this visit.   Physical Exam   Musculoskeletal Exam: ***  CDAI Exam:  CDAI Score: -- Patient Global: --; Provider Global: -- Swollen: --; Tender: -- Joint Exam 04/10/2020   No joint exam has been documented for this visit   There is currently no information documented on the homunculus. Go to the Rheumatology activity and complete the homunculus joint  exam.  Investigation: No additional findings.  Imaging: No results found.  Recent Labs: Lab Results  Component Value Date   WBC 6.6 07/05/2019   HGB 10.7 (L) 07/05/2019   PLT 418 (H) 07/05/2019   NA 137 07/05/2019   K 3.8 07/05/2019   CL 102 07/05/2019   CO2 26 07/05/2019   GLUCOSE 81 07/05/2019   BUN 28 (H) 07/05/2019   CREATININE 0.67 07/05/2019   BILITOT 0.7 07/05/2019   ALKPHOS 38 07/05/2019   AST 38 07/05/2019   ALT 29 07/05/2019   PROT 5.8 (L) 07/05/2019   ALBUMIN 2.5 (L) 07/05/2019   CALCIUM 8.4 (L) 07/05/2019   GFRAA >60 07/05/2019    Speciality Comments: No specialty comments available.  Procedures:  No procedures performed Allergies: Patient has no known allergies.   Assessment / Plan:     Visit Diagnoses: No diagnosis found.  Orders: No orders of the defined types were placed in this encounter.  No orders of the defined types were placed in this encounter.   Face-to-face time spent with patient was *** minutes. Greater than 50% of time was spent in counseling and coordination of care.  Follow-Up Instructions: No follow-ups on file.   Ellen Henri, CMA  Note - This record has been created using Animal nutritionist.  Chart creation errors have been sought, but may not always  have been located. Such creation errors do not reflect on  the standard of medical care.

## 2020-04-10 ENCOUNTER — Ambulatory Visit: Payer: Medicare Other | Admitting: Physician Assistant

## 2020-05-02 NOTE — Progress Notes (Signed)
Office Visit Note  Patient: Zoe Robinson             Date of Birth: Nov 05, 1931           MRN: [id number]             PCP: Gabriel Cirri, NP Referring: Gabriel Cirri, NP Visit Date: 05/14/2020 Occupation: @GUAROCC @  Subjective:  Medication monitoring   History of Present Illness: Gelinda Lineback is a 86 y.o. female with history of seropositive rheumatoid arthritis and osteoporosis.  She is taking methotrexate 3 tablets by mouth once weekly, folic acid 1 mg po daily, and prednisone 5 mg daily.  She is tolerating both medications without any side effects.  She denies any recent infections.  She has received both COVID-19 vaccinations and plans on receiving a third dose.  She denies any recent rheumatoid arthritis flares.  She is not experiencing any joint pain or inflammation at this time.  She denies any morning stiffness.  She has been sleeping well and denies any nocturnal pain currently.    Activities of Daily Living:  Patient reports morning stiffness for 0 minutes.   Patient Denies nocturnal pain.  Difficulty dressing/grooming: Denies Difficulty climbing stairs: Denies Difficulty getting out of chair: Denies Difficulty using hands for taps, buttons, cutlery, and/or writing: Reports  Review of Systems  Constitutional: Negative for fatigue.  HENT: Negative for mouth sores, mouth dryness and nose dryness.   Eyes: Negative for pain, visual disturbance and dryness.  Respiratory: Negative for cough, hemoptysis, shortness of breath and difficulty breathing.   Cardiovascular: Negative for chest pain, palpitations, hypertension and swelling in legs/feet.  Gastrointestinal: Negative for blood in stool, constipation and diarrhea.  Endocrine: Negative for increased urination.  Genitourinary: Negative for painful urination.  Musculoskeletal: Positive for arthralgias, joint pain and joint swelling. Negative for myalgias, muscle weakness, morning stiffness, muscle tenderness and  myalgias.  Skin: Negative for color change, pallor, rash, hair loss, nodules/bumps, skin tightness, ulcers and sensitivity to sunlight.  Allergic/Immunologic: Negative for susceptible to infections.  Neurological: Negative for dizziness, numbness, headaches and weakness.  Hematological: Negative for swollen glands.  Psychiatric/Behavioral: Negative for depressed mood and sleep disturbance. The patient is not nervous/anxious.     PMFS History:  Patient Active Problem List   Diagnosis Date Noted  . COVID-19 virus infection 06/30/2019  . High risk medication use 03/25/2017  . Rheumatoid arthritis involving multiple sites with positive rheumatoid factor (HCC) 03/15/2017  . Rheumatoid arthritis involving both knees with positive rheumatoid factor (HCC) 03/15/2017  . Rheumatoid arthritis involving both hands with positive rheumatoid factor (HCC) 03/15/2017  . Rheumatoid arthritis involving both feet with positive rheumatoid factor (HCC) 03/15/2017  . History of vertebral fracture 03/15/2017  . On prednisone therapy 02/10/2017  . Age-related osteoporosis without current pathological fracture 02/10/2017  . History of pulmonary hypertension 02/10/2017  . History of atrial fibrillation 02/10/2017  . History of anemia 02/10/2017  . History of recurrent UTIs 02/10/2017  . History of hypercalcemia 02/10/2017  . On anticoagulant therapy 02/10/2017  . Acute encephalopathy 08/19/2014  . Neutropenia (HCC) 04/20/2014  . Fever, unspecified 04/20/2014  . Unspecified constipation 04/20/2014  . Hypercalcemia 04/15/2014  . Acute renal failure (HCC) 04/15/2014  . Generalized weakness 04/15/2014  . Dehydration 04/15/2014  . UTI (urinary tract infection) 04/15/2014  . Iron deficiency anemia 04/15/2014  . CKD (chronic kidney disease)   . Hypertension   . Atrial fibrillation [facility])     Past Medical History:  Diagnosis Date  . Arthritis   .  Atrial fibrillation (HCC)   . Chronic atrial fibrillation  (HCC)   . CKD (chronic kidney disease)   . Hypertension   . Hypothyroidism   . Incontinence   . RA (rheumatoid arthritis) (HCC)     Family History  Problem Relation Age of Onset  . Heart attack Mother   . Cancer Mother   . Hypertension Mother   . Heart disease Mother   . Heart attack Father   . Stomach cancer Father   . Hypertension Father   . Heart disease Father   . Heart attack Brother   . Heart attack Brother   . Stomach cancer Brother   . Cancer Sister   . Dementia Sister   . Osteoarthritis Daughter   . Depression Daughter   . Hypothyroidism Daughter   . Hypercholesterolemia Daughter   . Heart disease Son   . Hypertension Son   . Gout Son    Past Surgical History:  Procedure Laterality Date  . ABDOMINAL HYSTERECTOMY  1990   Social History   Social History Narrative  . Not on file   Immunization History  Administered Date(s) Administered  . Influenza,inj,Quad PF,6+ Mos 04/19/2014  . Moderna SARS-COVID-2 Vaccination 07/21/2019, 08/21/2019  . Pneumococcal Conjugate-13 05/16/2015  . Pneumococcal Polysaccharide-23 10/25/2010, 04/17/2014  . Tdap 10/25/2010     Objective: Vital Signs: BP (!) 144/69 (BP Location: Left Arm, Patient Position: Sitting, Cuff Size: Small)   Pulse 83   Ht 4\' 11"  (1.499 m)   Wt 122 lb (55.3 kg)   BMI 24.64 kg/m    Physical Exam Vitals and nursing note reviewed.  Constitutional:      Appearance: She is well-developed.  HENT:     Head: Normocephalic and atraumatic.  Eyes:     Conjunctiva/sclera: Conjunctivae normal.  Pulmonary:     Effort: Pulmonary effort is normal.  Abdominal:     Palpations: Abdomen is soft.  Musculoskeletal:     Cervical back: Normal range of motion.  Skin:    General: Skin is warm and dry.     Capillary Refill: Capillary refill takes less than 2 seconds.  Neurological:     Mental Status: She is alert and oriented to person, place, and time.  Psychiatric:        Behavior: Behavior normal.       Musculoskeletal Exam: C-spine has good range of motion with no discomfort.  Thoracic kyphosis noted.  No midline spinal tenderness.  No SI joint tenderness.  Shoulder joints have good range of motion with no discomfort.  She has limited range of motion of both wrist joints.  Synovial thickening of both wrist joints noted but no inflammation or tenderness was apparent.  Ulnar deviation, subluxation, and synovial thickening of all MCP joints noted.  PIP and DIP thickening and flexion contractures noted.  Knee joints have good range of motion with no warmth or effusion.  Ankle joints have good range of motion with no tenderness or inflammation.  Left second and third hammertoes noted.  No tenderness of MTP joints.  CDAI Exam: CDAI Score: 0.4  Patient Global: 2 mm; Provider Global: 2 mm Swollen: 0 ; Tender: 0  Joint Exam 05/14/2020   No joint exam has been documented for this visit   There is currently no information documented on the homunculus. Go to the Rheumatology activity and complete the homunculus joint exam.  Investigation: No additional findings.  Imaging: No results found.  Recent Labs: Lab Results  Component Value Date   WBC  6.6 07/05/2019   HGB 10.7 (L) 07/05/2019   PLT 418 (H) 07/05/2019   NA 137 07/05/2019   K 3.8 07/05/2019   CL 102 07/05/2019   CO2 26 07/05/2019   GLUCOSE 81 07/05/2019   BUN 28 (H) 07/05/2019   CREATININE 0.67 07/05/2019   BILITOT 0.7 07/05/2019   ALKPHOS 38 07/05/2019   AST 38 07/05/2019   ALT 29 07/05/2019   PROT 5.8 (L) 07/05/2019   ALBUMIN 2.5 (L) 07/05/2019   CALCIUM 8.4 (L) 07/05/2019   GFRAA >60 07/05/2019    Speciality Comments: No specialty comments available.  Procedures:  No procedures performed Allergies: Patient has no known allergies.   Assessment / Plan:     Visit Diagnoses: Rheumatoid arthritis involving multiple sites with positive rheumatoid factor (HCC): She has no joint tenderness or synovitis on exam.  She has not  had any recent rheumatoid arthritis flares.  She is clinically doing well on methotrexate 3 tablets by mouth once weekly, folic acid 1 mg by mouth daily, and prednisone 5 mg 1 tablet by mouth daily.  She has not missed any doses of methotrexate recently.  She has not had any recent infections.  She has not been experiencing any morning stiffness or nocturnal pain.  She has some DIP faculty with ADLs due to the contractures of multiple joints in her hands.  She will continue on methotrexate and prednisone as prescribed.  She does not need any refills at this time.  She was advised to notify us if she develops increased joint pain or joint swelling.  She will follow-up in the office in 5 months.  High risk medication use - MTX 3 tablets by mouth once weekly, folic acid 1 mg 1 tablet by mouth daily, and long term prednisone 5 mg 1 tablet by mouth daily. CBC and CMP were drawn on 02/27/20.  We will update CBC and CMP today while she is in the office.  Orders for CBC and CMP were released.  Her next lab work will be due in January and every 3 months to monitor for drug toxicity.  Standing orders for CBC and CMP are in place. - Plan: CBC with Differential/Platelet, COMPLETE METABOLIC PANEL WITH GFR She has not had any recent infections.  We discussed the importance of holding methotrexate if she develops signs or symptoms of an infection and to resume once the infection has completely cleared.  She has received both COVID-19 vaccinations and plans on receiving the third dose.  We discussed the importance of holding methotrexate for 1 week after the third dose.  We also discussed the importance of avoiding Tylenol and NSAIDs 24 hours prior to the third dose.  She was advised to notify us or her PCP if she develops a COVID-19 infection in order to receive the monoclonal antibody infusion.  She was encouraged to continue to wear a mask and social distance.  She voiced understanding.  On prednisone therapy - She is taking  prednisone 5 mg 1 tablet by mouth daily.    Ulcer of left lower extremity, unspecified ulcer stage [facility]): Resolved   Age-related osteoporosis without current pathological fracture:  PCP orders DEXA.  Most recent DEXA (found in care everywhere) was on 06/18/17: Right femur neck BMD 0.657 with T-score -2.7. She is on Prolia 60 mg sq injections every 6 months managed by her PCP.  She has not had any recent falls or fractures.  She has a known history of vertebral fractures and thoracic kyphosis.  She has no midline spinal tenderness on exam today.  She is on long term prednisone 5 mg 1 tablet daily.    History of vertebral fracture: No midline spinal tenderness.   Other medical conditions are listed as follows:   History of atrial fibrillation  On anticoagulant therapy  History of pulmonary hypertension  Orders: Orders Placed This Encounter  Procedures  . CBC with Differential/Platelet  . COMPLETE METABOLIC PANEL WITH GFR   No orders of the defined types were placed in this encounter.    Follow-Up Instructions: Return in about 5 months (around 10/12/2020) for Rheumatoid arthritis, Osteoporosis.   Gearldine Bienenstock, PA-C  Note - This record has been created using Dragon software.  Chart creation errors have been sought, but may not always  have been located. Such creation errors do not reflect on  the standard of medical care.

## 2020-05-14 ENCOUNTER — Other Ambulatory Visit: Payer: Self-pay

## 2020-05-14 ENCOUNTER — Encounter: Payer: Self-pay | Admitting: Physician Assistant

## 2020-05-14 ENCOUNTER — Ambulatory Visit: Payer: Medicare Other | Admitting: Physician Assistant

## 2020-05-14 VITALS — BP 144/69 | HR 83 | Ht 59.0 in | Wt 122.0 lb

## 2020-05-14 DIAGNOSIS — Z79899 Other long term (current) drug therapy: Secondary | ICD-10-CM

## 2020-05-14 DIAGNOSIS — M810 Age-related osteoporosis without current pathological fracture: Secondary | ICD-10-CM

## 2020-05-14 DIAGNOSIS — M0579 Rheumatoid arthritis with rheumatoid factor of multiple sites without organ or systems involvement: Secondary | ICD-10-CM

## 2020-05-14 DIAGNOSIS — Z7901 Long term (current) use of anticoagulants: Secondary | ICD-10-CM

## 2020-05-14 DIAGNOSIS — Z7952 Long term (current) use of systemic steroids: Secondary | ICD-10-CM

## 2020-05-14 DIAGNOSIS — L97929 Non-pressure chronic ulcer of unspecified part of left lower leg with unspecified severity: Secondary | ICD-10-CM | POA: Diagnosis not present

## 2020-05-14 DIAGNOSIS — Z8781 Personal history of (healed) traumatic fracture: Secondary | ICD-10-CM

## 2020-05-14 DIAGNOSIS — Z8679 Personal history of other diseases of the circulatory system: Secondary | ICD-10-CM

## 2020-05-14 NOTE — Patient Instructions (Signed)
COVID-19 vaccine recommendations:   COVID-19 vaccine is recommended for everyone (unless you are allergic to a vaccine component), even if you are on a medication that suppresses your immune system.   If you are on Methotrexate, Cellcept (mycophenolate), Rinvoq, Xeljanz, and Olumiant- hold the medication for 1 week after each vaccine. Hold Methotrexate for 2 weeks after the single dose COVID-19 vaccine.   If you are on Orencia subcutaneous injection - hold medication one week prior to and one week after the first COVID-19 vaccine dose (only).   If you are on Orencia IV infusions- time vaccination administration so that the first COVID-19 vaccination will occur four weeks after the infusion and postpone the subsequent infusion by one week.   If you are on Cyclophosphamide or Rituxan infusions please contact your doctor prior to receiving the COVID-19 vaccine.   Do not take Tylenol or any anti-inflammatory medications (NSAIDs) 24 hours prior to the COVID-19 vaccination.   There is no direct evidence about the efficacy of the COVID-19 vaccine in individuals who are on medications that suppress the immune system.   Even if you are fully vaccinated, and you are on any medications that suppress your immune system, please continue to wear a mask, maintain at least six feet social distance and practice hand hygiene.   If you develop a COVID-19 infection, please contact your PCP or our office to determine if you need antibody infusion.  The booster vaccine is now available for immunocompromised patients. It is advised that if you had Pfizer vaccine you should get Pfizer booster.  If you had a Moderna vaccine then you should get a Moderna booster. Johnson and Johnson does not have a booster vaccine at this time.  Please see the following web sites for updated information.    [email or web address]://[email or web address]/[email or web address]-Vaccination-Patient-Resources.[email or web address]  [email or web address]  Standing Labs We placed an order today for your standing lab work.   Please have your standing labs drawn in January and every 3 months   If possible, please have your labs drawn 2 weeks prior to your appointment so that the provider can discuss your results at your appointment.  We have open lab daily Monday through Thursday from 8:30-12:30 PM and 1:30-4:30 PM and Friday from 8:30-12:30 PM and 1:30-4:00 PM at the office of Dr. Shaili Deveshwar, [facility].   Please be advised, patients with office appointments requiring lab work will take precedents over walk-in lab work.  If possible, please come for your lab work on Monday and Friday afternoons, as you may experience shorter wait times. The office is located at [address], [address], [place], [place] [address] No appointment is necessary.   Labs are drawn by [facility]. Please bring your co-pay at the time of your lab draw.  You may receive a bill from [facility] for your lab work.  If you wish to have your labs drawn at another location, please call the office 24 hours in advance to send orders.  If you have any questions regarding directions or hours of operation,  please call [phone].   As a reminder, please drink plenty of water prior to coming for your lab work. Thanks!   

## 2020-05-15 LAB — COMPLETE METABOLIC PANEL WITH GFR
AG Ratio: 1.2 (calc) (ref 1.0–2.5)
ALT: 14 U/L (ref 6–29)
AST: 17 U/L (ref 10–35)
Albumin: 3.6 g/dL (ref 3.6–5.1)
Alkaline phosphatase (APISO): 46 U/L (ref 37–153)
BUN/Creatinine Ratio: 29 (calc) — ABNORMAL HIGH (ref 6–22)
BUN: 29 mg/dL — ABNORMAL HIGH (ref 7–25)
CO2: 32 mmol/L (ref 20–32)
Calcium: 9.3 mg/dL (ref 8.6–10.4)
Chloride: 102 mmol/L (ref 98–110)
Creat: 0.99 mg/dL — ABNORMAL HIGH (ref 0.60–0.88)
GFR, Est African American: 60 mL/min/{1.73_m2} (ref >=60)
GFR, Est Non African American: 52 mL/min/{1.73_m2} — ABNORMAL LOW (ref >=60)
Globulin: 3.1 g/dL (calc) (ref 1.9–3.7)
Glucose, Bld: 132 mg/dL — ABNORMAL HIGH (ref 65–99)
Potassium: 5 mmol/L (ref 3.5–5.3)
Sodium: 139 mmol/L (ref 135–146)
Total Bilirubin: 0.4 mg/dL (ref 0.2–1.2)
Total Protein: 6.7 g/dL (ref 6.1–8.1)

## 2020-05-15 LAB — CBC WITH DIFFERENTIAL/PLATELET
Absolute Monocytes: 488 cells/uL (ref 200–950)
Basophils Absolute: 30 cells/uL (ref 0–200)
Basophils Relative: 0.4 %
Eosinophils Absolute: 37 cells/uL (ref 15–500)
Eosinophils Relative: 0.5 %
HCT: 33.9 % — ABNORMAL LOW (ref 35.0–45.0)
Hemoglobin: 11.4 g/dL — ABNORMAL LOW (ref 11.7–15.5)
Lymphs Abs: 1258 cells/uL (ref 850–3900)
MCH: 30.2 pg (ref 27.0–33.0)
MCHC: 33.6 g/dL (ref 32.0–36.0)
MCV: 89.7 fL (ref 80.0–100.0)
MPV: 9.9 fL (ref 7.5–12.5)
Monocytes Relative: 6.6 %
Neutro Abs: 5587 cells/uL (ref 1500–7800)
Neutrophils Relative %: 75.5 %
Platelets: 346 10*3/uL (ref 140–400)
RBC: 3.78 10*6/uL — ABNORMAL LOW (ref 3.80–5.10)
RDW: 13 % (ref 11.0–15.0)
Total Lymphocyte: 17 %
WBC: 7.4 10*3/uL (ref 3.8–10.8)

## 2020-05-15 NOTE — Progress Notes (Signed)
RBC count, hgb, and hct are low but improving. Rest of CBC WNL.   Creatinine is mildly elevated-0.99. GFR is 52.  Please advise the patient to avoid taking NSAIDs.  Reviewed lab work with Dr. Corliss Skains and she would like the patient to reduce MTX to 2 tablets by mouth once weekly.

## 2020-05-22 ENCOUNTER — Other Ambulatory Visit: Payer: Self-pay | Admitting: Rheumatology

## 2020-05-22 NOTE — Telephone Encounter (Signed)
Last Visit: 05/14/2020 Next Visit: 10/16/2020 Labs: 04/26/2020 RBC count, hgb, and hct are low but improving. Rest of CBC WNL.  Creatinine is mildly elevated-0.99. GFR is 52.   Current Dose per lab note 05/15/2020: to reduce MTX to 2 tablets by mouth once weekly. DX:  Rheumatoid arthritis involving multiple sites with positive rheumatoid factor   Okay to refill MTX?

## 2020-07-21 ENCOUNTER — Other Ambulatory Visit: Payer: Self-pay | Admitting: Rheumatology

## 2020-07-21 NOTE — Telephone Encounter (Signed)
Last Visit: 05/14/2020 Next Visit: 10/16/2020 Labs: 05/14/2020 RBC count, hgb, and hct are low but improving. Rest of CBC WNL.  Creatinine is mildly elevated-0.99. GFR is 52. Please advise the patient to avoid taking NSAIDs. Reviewed lab work with Dr. Corliss Skains and she would like the patient to reduce MTX to 2 tablets by mouth once weekly.  Current Dose per office note 05/14/2020: Methotrexate 3 tablets by mouth once weekly *05/14/2020 Reviewed lab work with Dr. Corliss Skains and she would like the patient to reduce MTX to 2 tablets by mouth once weekly. DX: Rheumatoid arthritis involving multiple sites with positive rheumatoid factor   Okay to refill MTX?

## 2020-07-29 ENCOUNTER — Ambulatory Visit: Payer: Medicare Other | Admitting: Physician Assistant

## 2020-09-11 ENCOUNTER — Other Ambulatory Visit: Payer: Self-pay | Admitting: Rheumatology

## 2020-09-11 DIAGNOSIS — Z79899 Other long term (current) drug therapy: Secondary | ICD-10-CM

## 2020-09-15 ENCOUNTER — Telehealth: Payer: Self-pay

## 2020-09-15 NOTE — Telephone Encounter (Signed)
Patient's daughter called stating her mom is scheduled to see her PCP on 09/24/20 and plans to have labwork at that appointment.  She will request they fax the results to Dr. Corliss Skains.

## 2020-09-15 NOTE — Telephone Encounter (Signed)
Noted  

## 2020-09-16 ENCOUNTER — Other Ambulatory Visit: Payer: Self-pay

## 2020-09-16 MED ORDER — PREDNISONE 5 MG PO TABS: 5.0000 mg | ORAL_TABLET | Freq: Every day | ORAL | 0 refills | Status: DC

## 2020-09-16 NOTE — Telephone Encounter (Signed)
Patient's daughter [name] left a voicemail requesting prescription refills of Methotrexate and Prednisone.

## 2020-09-16 NOTE — Telephone Encounter (Signed)
Left message to advise [name] it is too soon to refill the MTX as the prescription was sent for a 90 day supply on 07/21/2020. Advised [name] we would also need updated labs prior to refill. Advised her we would work on getting the prednisone prescription sent to the pharmacy.   Last Visit: 05/14/2020 Next Visit: 10/16/2020  Current Dose per office note on 05/14/2020:  prednisone 5 mg 1 tablet by mouth daily Dx: Rheumatoid arthritis involving multiple sites with positive rheumatoid factor    Okay to refill Prednisone?

## 2020-10-02 NOTE — Progress Notes (Signed)
Office Visit Note  Patient: Zoe Robinson             Date of Birth: Apr 06, 1932           MRN: [id number]             PCP: Gabriel Cirri, NP Referring: Gabriel Cirri, NP Visit Date: 10/16/2020 Occupation: @GUAROCC @  Subjective:  Medication Management   History of Present Illness: Zoe Robinson is a 87 y.o. female with a history of rheumatoid arthritis and osteoarthritis overlap.  She was accompanied by her daughter today.  She states she has been taking prednisone 5 mg daily.  She has been unable to taper prednisone.  She continues to take methotrexate 3 tablets/week.  She was advised to reduce methotrexate to 2 tablets/week but she forgot to decrease it.  She denies any morning stiffness.  Her daughter states that she does not complain about joint pain or joint swelling.  She has been able to do most routine activities.  Activities of Daily Living:  Patient reports morning stiffness for 10-20 minutes.   Patient Denies nocturnal pain.  Difficulty dressing/grooming: Denies Difficulty climbing stairs: Reports Difficulty getting out of chair: Denies Difficulty using hands for taps, buttons, cutlery, and/or writing: Reports  Review of Systems  Constitutional: Negative for fatigue.  HENT: Negative for mouth sores, mouth dryness and nose dryness.   Eyes: Negative for pain, itching and dryness.  Respiratory: Negative for shortness of breath and difficulty breathing.   Cardiovascular: Negative for chest pain and palpitations.  Gastrointestinal: Negative for blood in stool, constipation and diarrhea.  Endocrine: Negative for increased urination.  Genitourinary: Negative for difficulty urinating.  Musculoskeletal: Positive for arthralgias, joint pain, joint swelling and morning stiffness. Negative for myalgias, muscle tenderness and myalgias.  Skin: Negative for color change, rash and redness.  Allergic/Immunologic: Negative for susceptible to infections.  Neurological:  Negative for dizziness, numbness, headaches and weakness.  Hematological: Positive for bruising/bleeding tendency.  Psychiatric/Behavioral: Negative for sleep disturbance.    PMFS History:  Patient Active Problem List   Diagnosis Date Noted  . COVID-19 virus infection 06/30/2019  . High risk medication use 03/25/2017  . Rheumatoid arthritis involving multiple sites with positive rheumatoid factor (HCC) 03/15/2017  . Rheumatoid arthritis involving both knees with positive rheumatoid factor (HCC) 03/15/2017  . Rheumatoid arthritis involving both hands with positive rheumatoid factor (HCC) 03/15/2017  . Rheumatoid arthritis involving both feet with positive rheumatoid factor (HCC) 03/15/2017  . History of vertebral fracture 03/15/2017  . On prednisone therapy 02/10/2017  . Age-related osteoporosis without current pathological fracture 02/10/2017  . History of pulmonary hypertension 02/10/2017  . History of atrial fibrillation 02/10/2017  . History of anemia 02/10/2017  . History of recurrent UTIs 02/10/2017  . History of hypercalcemia 02/10/2017  . On anticoagulant therapy 02/10/2017  . Acute encephalopathy 08/19/2014  . Neutropenia (HCC) 04/20/2014  . Fever, unspecified 04/20/2014  . Unspecified constipation 04/20/2014  . Hypercalcemia 04/15/2014  . Acute renal failure (HCC) 04/15/2014  . Generalized weakness 04/15/2014  . Dehydration 04/15/2014  . UTI (urinary tract infection) 04/15/2014  . Iron deficiency anemia 04/15/2014  . CKD (chronic kidney disease)   . Hypertension   . Atrial fibrillation [facility])     Past Medical History:  Diagnosis Date  . Arthritis   . Atrial fibrillation (HCC)   . Chronic atrial fibrillation (HCC)   . CKD (chronic kidney disease)   . Hypertension   . Hypothyroidism   . Incontinence   . RA (rheumatoid  arthritis) (HCC)     Family History  Problem Relation Age of Onset  . Heart attack Mother   . Cancer Mother   . Hypertension Mother   . Heart  disease Mother   . Heart attack Father   . Stomach cancer Father   . Hypertension Father   . Heart disease Father   . Heart attack Brother   . Heart attack Brother   . Stomach cancer Brother   . Cancer Sister   . Dementia Sister   . Osteoarthritis Daughter   . Depression Daughter   . Hypothyroidism Daughter   . Hypercholesterolemia Daughter   . Heart disease Son   . Hypertension Son   . Gout Son    Past Surgical History:  Procedure Laterality Date  . ABDOMINAL HYSTERECTOMY  1990   Social History   Social History Narrative  . Not on file   Immunization History  Administered Date(s) Administered  . Influenza,inj,Quad PF,6+ Mos 04/19/2014  . Moderna Sars-Covid-2 Vaccination 07/21/2019, 08/21/2019  . Pneumococcal Conjugate-13 05/16/2015  . Pneumococcal Polysaccharide-23 10/25/2010, 04/17/2014  . Tdap 10/25/2010     Objective: Vital Signs: BP 108/70 (BP Location: Left Arm, Patient Position: Sitting, Cuff Size: Normal)   Pulse 71   Resp 14   Ht 4\' 11"  (1.499 m)   Wt 129 lb (58.5 kg)   BMI 26.05 kg/m    Physical Exam Vitals and nursing note reviewed.  Constitutional:      Appearance: She is well-developed.  HENT:     Head: Normocephalic and atraumatic.  Eyes:     Conjunctiva/sclera: Conjunctivae normal.  Cardiovascular:     Rate and Rhythm: Normal rate and regular rhythm.     Heart sounds: Normal heart sounds.  Pulmonary:     Effort: Pulmonary effort is normal.     Breath sounds: Normal breath sounds.  Abdominal:     General: Bowel sounds are normal.     Palpations: Abdomen is soft.  Musculoskeletal:     Cervical back: Normal range of motion.  Lymphadenopathy:     Cervical: No cervical adenopathy.  Skin:    General: Skin is warm and dry.     Capillary Refill: Capillary refill takes less than 2 seconds.  Neurological:     Mental Status: She is alert and oriented to person, place, and time.  Psychiatric:        Behavior: Behavior normal.       Musculoskeletal Exam: Good range of motion of her C-spine.  Mild thoracic kyphosis was noted.  She had no point tenderness over thoracic or lumbar spine.  Shoulder joints were in good range of motion.  Elbow joints with good range of motion.  She has limited range of motion of bilateral wrist joints.  She had incomplete extension of her MCP joints and PIP joints of her right hand.  Synovial thickening was noted over bilateral wrist joints, MCPs PIPs and and DIPs.  No active synovitis was noted.  Hip joints were difficult to examine.  Knee joints were in good range of motion without any warmth or swelling.  She had no tenderness over ankles or MTPs.  CDAI Exam: CDAI Score: 0.3  Patient Global: 1 mm; Provider Global: 2 mm Swollen: 0 ; Tender: 0  Joint Exam 10/16/2020   No joint exam has been documented for this visit   There is currently no information documented on the homunculus. Go to the Rheumatology activity and complete the homunculus joint exam.  Investigation: No additional  findings.  Imaging: No results found.  Recent Labs: Lab Results  Component Value Date   WBC 7.4 05/14/2020   HGB 11.4 (L) 05/14/2020   PLT 346 05/14/2020   NA 139 05/14/2020   K 5.0 05/14/2020   CL 102 05/14/2020   CO2 32 05/14/2020   GLUCOSE 132 (H) 05/14/2020   BUN 29 (H) 05/14/2020   CREATININE 0.99 (H) 05/14/2020   BILITOT 0.4 05/14/2020   ALKPHOS 38 07/05/2019   AST 17 05/14/2020   ALT 14 05/14/2020   PROT 6.7 05/14/2020   ALBUMIN 2.5 (L) 07/05/2019   CALCIUM 9.3 05/14/2020   GFRAA 60 05/14/2020    Speciality Comments: No specialty comments available.  Procedures:  No procedures performed Allergies: Patient has no known allergies.   Assessment / Plan:     Visit Diagnoses: Rheumatoid arthritis involving multiple sites with positive rheumatoid factor (HCC)-she denies any joint pain or joint swelling.  There was no synovitis on examination.  She was advised to reduce methotrexate from 3  tablets to 2 tablets due to increase in  creatinine.  She continues to take methotrexate 3 tablets/week along with prednisone 5 mg p.o. daily.  He has limited range of motion of her right hand hand contracture in her right hand MCPs and PIPs with ulnar deviation.  Her daughter states she is able to feed herself and is able to brush.  High risk medication use - MTX 3 tablets by mouth once weekly, folic acid 1 mg 1 tablet by mouth daily, and long term prednisone 5 mg 1 tablet by mouth daily.  - Plan: CBC with Differential/Platelet, COMPLETE METABOLIC PANEL WITH GFR if her creatinine is still elevated we will reduce methotrexate to 2 tablets p.o. weekly.  Increased risk of infection with immunosuppression was discussed.  In case she develops an infection she should hold methotrexate and restart after the infection resolves.  She has been fully vaccinated against COVID-19.  Booster dosing was also discussed.  On prednisone therapy - prednisone 5 mg 1 tablet by mouth daily.  Long-term side effects of prednisone therapy were also discussed.  Age-related osteoporosis without current pathological fracture - Prolia 60 mg sq injections every 6 months managed by her PCP.  DEXA (found in care everywhere) was on 06/18/17: Right femur neck BMD 0.657 with T-score -2.7.  History of vertebral fracture  On anticoagulant therapy-she is on Xarelto.  History of atrial fibrillation  History of pulmonary hypertension  Orders: Orders Placed This Encounter  Procedures  . CBC with Differential/Platelet  . COMPLETE METABOLIC PANEL WITH GFR   No orders of the defined types were placed in this encounter.    Follow-Up Instructions: Return in about 5 months (around 03/18/2021) for Rheumatoid arthritis.   Pollyann Savoy, MD  Note - This record has been created using Animal nutritionist.  Chart creation errors have been sought, but may not always  have been located. Such creation errors do not reflect on  the standard  of medical care.

## 2020-10-16 ENCOUNTER — Other Ambulatory Visit: Payer: Self-pay

## 2020-10-16 ENCOUNTER — Ambulatory Visit: Payer: Medicare Other | Admitting: Rheumatology

## 2020-10-16 ENCOUNTER — Encounter: Payer: Self-pay | Admitting: Rheumatology

## 2020-10-16 VITALS — BP 108/70 | HR 71 | Resp 14 | Ht 59.0 in | Wt 129.0 lb

## 2020-10-16 DIAGNOSIS — Z79899 Other long term (current) drug therapy: Secondary | ICD-10-CM

## 2020-10-16 DIAGNOSIS — Z7952 Long term (current) use of systemic steroids: Secondary | ICD-10-CM

## 2020-10-16 DIAGNOSIS — M810 Age-related osteoporosis without current pathological fracture: Secondary | ICD-10-CM

## 2020-10-16 DIAGNOSIS — Z8781 Personal history of (healed) traumatic fracture: Secondary | ICD-10-CM

## 2020-10-16 DIAGNOSIS — Z8679 Personal history of other diseases of the circulatory system: Secondary | ICD-10-CM

## 2020-10-16 DIAGNOSIS — L97929 Non-pressure chronic ulcer of unspecified part of left lower leg with unspecified severity: Secondary | ICD-10-CM

## 2020-10-16 DIAGNOSIS — M0579 Rheumatoid arthritis with rheumatoid factor of multiple sites without organ or systems involvement: Secondary | ICD-10-CM

## 2020-10-16 DIAGNOSIS — Z7901 Long term (current) use of anticoagulants: Secondary | ICD-10-CM

## 2020-10-16 NOTE — Patient Instructions (Signed)
Standing Labs We placed an order today for your standing lab work.   Please have your standing labs drawn in June and every 3 months   If possible, please have your labs drawn 2 weeks prior to your appointment so that the provider can discuss your results at your appointment.  We have open lab daily Monday through Thursday from 1:30-4:30 PM and Friday from 1:30-4:00 PM at the office of Dr. Jamol Ginyard, [facility].   Please be advised, all patients with office appointments requiring lab work will take precedents over walk-in lab work.  If possible, please come for your lab work on Monday and Friday afternoons, as you may experience shorter wait times. The office is located at [address], [address], [place], [place] [address] No appointment is necessary.   Labs are drawn by [facility]. Please bring your co-pay at the time of your lab draw.  You may receive a bill from [facility] for your lab work.  If you wish to have your labs drawn at another location, please call the office 24 hours in advance to send orders.  If you have any questions regarding directions or hours of operation,  please call [phone].   As a reminder, please drink plenty of water prior to coming for your lab work. Thanks!   

## 2020-10-17 LAB — COMPLETE METABOLIC PANEL WITH GFR
AG Ratio: 1.2 (calc) (ref 1.0–2.5)
ALT: 12 U/L (ref 6–29)
AST: 17 U/L (ref 10–35)
Albumin: 3.6 g/dL (ref 3.6–5.1)
Alkaline phosphatase (APISO): 43 U/L (ref 37–153)
BUN/Creatinine Ratio: 31 (calc) — ABNORMAL HIGH (ref 6–22)
BUN: 33 mg/dL — ABNORMAL HIGH (ref 7–25)
CO2: 28 mmol/L (ref 20–32)
Calcium: 8.8 mg/dL (ref 8.6–10.4)
Chloride: 103 mmol/L (ref 98–110)
Creat: 1.06 mg/dL — ABNORMAL HIGH (ref 0.60–0.88)
GFR, Est African American: 55 mL/min/{1.73_m2} — ABNORMAL LOW (ref >=60)
GFR, Est Non African American: 48 mL/min/{1.73_m2} — ABNORMAL LOW (ref >=60)
Globulin: 2.9 g/dL (calc) (ref 1.9–3.7)
Glucose, Bld: 146 mg/dL — ABNORMAL HIGH (ref 65–99)
Potassium: 4.9 mmol/L (ref 3.5–5.3)
Sodium: 139 mmol/L (ref 135–146)
Total Bilirubin: 0.4 mg/dL (ref 0.2–1.2)
Total Protein: 6.5 g/dL (ref 6.1–8.1)

## 2020-10-17 LAB — CBC WITH DIFFERENTIAL/PLATELET
Absolute Monocytes: 460 cells/uL (ref 200–950)
Basophils Absolute: 31 cells/uL (ref 0–200)
Basophils Relative: 0.4 %
Eosinophils Absolute: 62 cells/uL (ref 15–500)
Eosinophils Relative: 0.8 %
HCT: 34.1 % — ABNORMAL LOW (ref 35.0–45.0)
Hemoglobin: 11.2 g/dL — ABNORMAL LOW (ref 11.7–15.5)
Lymphs Abs: 1162 cells/uL (ref 850–3900)
MCH: 29.5 pg (ref 27.0–33.0)
MCHC: 32.8 g/dL (ref 32.0–36.0)
MCV: 89.7 fL (ref 80.0–100.0)
MPV: 9.7 fL (ref 7.5–12.5)
Monocytes Relative: 5.9 %
Neutro Abs: 6084 cells/uL (ref 1500–7800)
Neutrophils Relative %: 78 %
Platelets: 306 10*3/uL (ref 140–400)
RBC: 3.8 10*6/uL (ref 3.80–5.10)
RDW: 13.6 % (ref 11.0–15.0)
Total Lymphocyte: 14.9 %
WBC: 7.8 10*3/uL (ref 3.8–10.8)

## 2020-10-17 NOTE — Progress Notes (Signed)
Mild anemia noted which is a stable.  Glucose is elevated, GFR is low.  Please forward labs to her PCP.  Please advise patient to reduce methotrexate to 2 tablets p.o. weekly.

## 2020-12-08 ENCOUNTER — Other Ambulatory Visit: Payer: Self-pay | Admitting: Physician Assistant

## 2020-12-09 NOTE — Telephone Encounter (Signed)
Next Visit: 03/18/2021  Last Visit: 10/16/2020  Last Fill: 09/16/2020  Dx:  Rheumatoid arthritis involving multiple sites with positive rheumatoid factor  Current Dose per office note on 10/16/2020, long term prednisone 5 mg 1 tablet by mouth daily  Okay to refill prednisone?

## 2021-03-04 NOTE — Progress Notes (Signed)
Office Visit Note  Patient: Zoe Robinson             Date of Birth: [birth year]           MRN: [id number]             PCP: Hortencia Conradi, NP Referring: Gabriel Cirri, NP Visit Date: 03/18/2021 Occupation: @GUAROCC @  Subjective:  Medication monitoring  History of Present Illness: Shontrice Arocha is a 87 y.o. female with history of seropositive rheumatoid arthritis and osteoporosis.  Patient is currently taking methotrexate 2 tablets by mouth once weekly, folic acid 1 mg by mouth daily, prednisone 5 mg 1 tablet daily.  The dose of methotrexate was reduced from 3 tablets to 2 tablets weekly after her last office visit on 10/16/2020 due to elevated creatinine.  The patient was accompanied by her daughter Sedalia Muta today who acted as the primary historian.  According to the patient's daughter she has not been complaining of any increased joint pain or joint swelling.  She denies any recent falls or fractures.  She has not been using a cane or walker to assist with ambulation.  She has been trying to maintain some of her independence.  Her daughter denies any new medical conditions or new concerns at this time.   Activities of Daily Living:  Patient reports morning stiffness for 0 minutes.   Patient Reports nocturnal pain.  Difficulty dressing/grooming: Denies Difficulty climbing stairs: Denies Difficulty getting out of chair: Denies Difficulty using hands for taps, buttons, cutlery, and/or writing: Reports  Review of Systems  Constitutional:  Negative for fatigue.  HENT:  Negative for mouth sores, mouth dryness and nose dryness.   Eyes:  Negative for pain, itching and dryness.  Respiratory:  Negative for shortness of breath and difficulty breathing.   Cardiovascular:  Negative for chest pain and palpitations.  Gastrointestinal:  Negative for blood in stool, constipation and diarrhea.  Endocrine: Negative for increased urination.  Genitourinary:  Negative for difficulty urinating.   Musculoskeletal:  Positive for joint pain and joint pain. Negative for joint swelling, myalgias, morning stiffness, muscle tenderness and myalgias.  Skin:  Negative for color change, rash and redness.  Allergic/Immunologic: Negative for susceptible to infections.  Neurological:  Negative for dizziness, numbness, headaches and weakness.  Hematological:  Positive for bruising/bleeding tendency.  Psychiatric/Behavioral:  Negative for sleep disturbance.    PMFS History:  Patient Active Problem List   Diagnosis Date Noted   COVID-19 virus infection 06/30/2019   High risk medication use 03/25/2017   Rheumatoid arthritis involving multiple sites with positive rheumatoid factor (HCC) 03/15/2017   Rheumatoid arthritis involving both knees with positive rheumatoid factor (HCC) 03/15/2017   Rheumatoid arthritis involving both hands with positive rheumatoid factor (HCC) 03/15/2017   Rheumatoid arthritis involving both feet with positive rheumatoid factor (HCC) 03/15/2017   History of vertebral fracture 03/15/2017   On prednisone therapy 02/10/2017   Age-related osteoporosis without current pathological fracture 02/10/2017   History of pulmonary hypertension 02/10/2017   History of atrial fibrillation 02/10/2017   History of anemia 02/10/2017   History of recurrent UTIs 02/10/2017   History of hypercalcemia 02/10/2017   On anticoagulant therapy 02/10/2017   Acute encephalopathy 08/19/2014   Neutropenia (HCC) 04/20/2014   Fever, unspecified 04/20/2014   Unspecified constipation 04/20/2014   Hypercalcemia 04/15/2014   Acute renal failure (HCC) 04/15/2014   Generalized weakness 04/15/2014   Dehydration 04/15/2014   UTI (urinary tract infection) 04/15/2014   Iron deficiency anemia 04/15/2014   CKD (  chronic kidney disease)    Hypertension    Atrial fibrillation (HCC)     Past Medical History:  Diagnosis Date   Arthritis    Atrial fibrillation (HCC)    Chronic atrial fibrillation (HCC)     CKD (chronic kidney disease)    Hypertension    Hypothyroidism    Incontinence    RA (rheumatoid arthritis) (HCC)     Family History  Problem Relation Age of Onset   Heart attack Mother    Cancer Mother    Hypertension Mother    Heart disease Mother    Heart attack Father    Stomach cancer Father    Hypertension Father    Heart disease Father    Heart attack Brother    Heart attack Brother    Stomach cancer Brother    Cancer Sister    Dementia Sister    Osteoarthritis Daughter    Depression Daughter    Hypothyroidism Daughter    Hypercholesterolemia Daughter    Heart disease Son    Hypertension Son    Gout Son    Past Surgical History:  Procedure Laterality Date   ABDOMINAL HYSTERECTOMY  1990   Social History   Social History Narrative   Not on file   Immunization History  Administered Date(s) Administered   Influenza,inj,Quad PF,6+ Mos 04/19/2014   Moderna Sars-Covid-2 Vaccination 07/21/2019, 08/21/2019   Pneumococcal Conjugate-13 05/16/2015   Pneumococcal Polysaccharide-23 10/25/2010, 04/17/2014   Tdap 10/25/2010     Objective: Vital Signs: BP 120/72 (BP Location: Left Arm, Patient Position: Sitting, Cuff Size: Normal)   Pulse 70   Ht  (1.499 m)   Wt 130 lb (59 kg)   BMI 26.26 kg/m    Physical Exam Vitals and nursing note reviewed.  Constitutional:      Appearance: She is well-developed.  HENT:     Head: Normocephalic and atraumatic.  Eyes:     Conjunctiva/sclera: Conjunctivae normal.  Pulmonary:     Effort: Pulmonary effort is normal.  Abdominal:     Palpations: Abdomen is soft.  Musculoskeletal:     Cervical back: Normal range of motion.  Skin:    General: Skin is warm and dry.     Capillary Refill: Capillary refill takes less than 2 seconds.  Neurological:     Mental Status: She is alert and oriented to person, place, and time.  Psychiatric:        Behavior: Behavior normal.     Musculoskeletal Exam: C-spine has good range of  motion with no discomfort.  Mild thoracic kyphosis noted.  No midline spinal tenderness.  Shoulder joints have good range of motion with no discomfort.  Elbow joints have good range of motion with no tenderness or inflammation.  Limited range of motion of both wrist joints.  Incomplete extension of MCP joints and PIP joints of the right hand.  Synovial thickening of both wrists and all MCP joints.  PIP and DIP thickening was also noted.  She has no tenderness or synovitis over the MCP or PIP joints.  Hip joints have good range of motion with no discomfort.  Knee joints have good range of motion with no warmth or effusion.  Ankle joints have good range of motion with no tenderness or joint swelling.  CDAI Exam: CDAI Score: 0.2  Patient Global: 1 mm; Provider Global: 1 mm Swollen: 0 ; Tender: 0  Joint Exam 03/18/2021   No joint exam has been documented for this visit   There  is currently no information documented on the homunculus. Go to the Rheumatology activity and complete the homunculus joint exam.  Investigation: No additional findings.  Imaging: No results found.  Recent Labs: Lab Results  Component Value Date   WBC 7.8 10/16/2020   HGB 11.2 (L) 10/16/2020   PLT 306 10/16/2020   NA 139 10/16/2020   K 4.9 10/16/2020   CL 103 10/16/2020   CO2 28 10/16/2020   GLUCOSE 146 (H) 10/16/2020   BUN 33 (H) 10/16/2020   CREATININE 1.06 (H) 10/16/2020   BILITOT 0.4 10/16/2020   ALKPHOS 38 07/05/2019   AST 17 10/16/2020   ALT 12 10/16/2020   PROT 6.5 10/16/2020   ALBUMIN 2.5 (L) 07/05/2019   CALCIUM 8.8 10/16/2020   GFRAA 55 (L) 10/16/2020    Speciality Comments: No specialty comments available.  Procedures:  No procedures performed Allergies: Patient has no known allergies.   Assessment / Plan:     Visit Diagnoses: Rheumatoid arthritis involving multiple sites with positive rheumatoid factor (HCC): She has no joint tenderness or synovitis on examination today.  She has not had  any recent rheumatoid arthritis flares.  She has clinically been doing well taking methotrexate 2 tablets by mouth once weekly, folic acid 1 mg by mouth daily, and prednisone 5 mg 1 tablet by mouth daily.  The dose of methotrexate was reduced from 3 tablets to 2 tablets weekly after her last office visit on 10/16/2020 due to elevated creatinine and low GFR.  She has not been experiencing any increased joint pain or inflammation on the reduced dose of methotrexate.  The patient's daughter Sedalia Muta acted as the primary historian during examination today and overall feels that her arthritis has been well controlled on the current regimen.  No medication changes will be made at this time.  We will update CBC and CMP today to monitor for drug toxicity.  Refills of methotrexate, folic acid, and prednisone will be sent to the pharmacy today.  Her and her daughter were advised to notify us if she develops increased joint pain or joint swelling.  She will follow-up in the office in 6 months.  High risk medication use - Methotrexate 2 tablets by mouth once weekly, folic acid 1 mg 1 tablet by mouth daily, and long term prednisone 5 mg 1 tablet by mouth daily.  CBC and CMP were drawn on 10/16/2020.  Creatinine was 1.06 and GFR was 48 at that time.  We will recheck CBC and CMP today since reducing the dose of methotrexate from 3 tablets to 2 tablets weekly.- Plan: CBC with Differential/Platelet, COMPLETE METABOLIC PANEL WITH GFR She has not had any recent infections.  We discussed the importance of holding methotrexate if she develops signs or symptoms of an infection and to resume once infection has completely cleared.  On prednisone therapy - She takes prednisone 5 mg 1 tablet by mouth daily.  She and her daughter are aware of the risks of long-term prednisone use.  Age-related osteoporosis without current pathological fracture - Prolia 60 mg sq injections every 6 months managed by her PCP.  DEXA (found in care everywhere)  was on 06/18/17: Right femur neck BMD 0.657 with T-score -2.7.  History of vertebral fracture: Thoracic kyphosis noted. No midline spinal tenderness at this time.   Other medical conditions are listed as follows:   On anticoagulant therapy - She is taking Xarelto as prescribed.   History of pulmonary hypertension  History of atrial fibrillation  Orders: Orders Placed  This Encounter  Procedures   CBC with Differential/Platelet   COMPLETE METABOLIC PANEL WITH GFR    Meds ordered this encounter  Medications   methotrexate (RHEUMATREX) 2.5 MG tablet    Sig: Take 2 tablets (5 mg total) by mouth once a week. Caution:Chemotherapy. Protect from light.    Dispense:  24 tablet    Refill:  0   folic acid (FOLVITE) 1 MG tablet    Sig: Take 1 tablet (1 mg total) by mouth daily.    Dispense:  90 tablet    Refill:  3   predniSONE (DELTASONE) 5 MG tablet    Sig: TAKE 1 TABLET(5 MG) BY MOUTH DAILY WITH BREAKFAST    Dispense:  90 tablet    Refill:  0     Follow-Up Instructions: Return in about 6 months (around 09/18/2021) for Rheumatoid arthritis, Osteoarthritis.   Gearldine Bienenstock, PA-C  Note - This record has been created using Dragon software.  Chart creation errors have been sought, but may not always  have been located. Such creation errors do not reflect on  the standard of medical care.

## 2021-03-18 ENCOUNTER — Encounter: Payer: Self-pay | Admitting: Rheumatology

## 2021-03-18 ENCOUNTER — Ambulatory Visit: Payer: Medicare Other | Admitting: Physician Assistant

## 2021-03-18 ENCOUNTER — Telehealth: Payer: Self-pay

## 2021-03-18 ENCOUNTER — Other Ambulatory Visit: Payer: Self-pay

## 2021-03-18 VITALS — BP 120/72 | HR 70 | Ht 59.0 in | Wt 130.0 lb

## 2021-03-18 DIAGNOSIS — M810 Age-related osteoporosis without current pathological fracture: Secondary | ICD-10-CM | POA: Diagnosis not present

## 2021-03-18 DIAGNOSIS — Z79899 Other long term (current) drug therapy: Secondary | ICD-10-CM

## 2021-03-18 DIAGNOSIS — M0579 Rheumatoid arthritis with rheumatoid factor of multiple sites without organ or systems involvement: Secondary | ICD-10-CM

## 2021-03-18 DIAGNOSIS — Z7952 Long term (current) use of systemic steroids: Secondary | ICD-10-CM | POA: Diagnosis not present

## 2021-03-18 DIAGNOSIS — Z8781 Personal history of (healed) traumatic fracture: Secondary | ICD-10-CM

## 2021-03-18 DIAGNOSIS — Z8679 Personal history of other diseases of the circulatory system: Secondary | ICD-10-CM

## 2021-03-18 DIAGNOSIS — Z7901 Long term (current) use of anticoagulants: Secondary | ICD-10-CM

## 2021-03-18 LAB — COMPLETE METABOLIC PANEL WITH GFR
AG Ratio: 1.3 (calc) (ref 1.0–2.5)
ALT: 19 U/L (ref 6–29)
AST: 21 U/L (ref 10–35)
Albumin: 3.8 g/dL (ref 3.6–5.1)
Alkaline phosphatase (APISO): 49 U/L (ref 37–153)
BUN/Creatinine Ratio: 33 (calc) — ABNORMAL HIGH (ref 6–22)
BUN: 41 mg/dL — ABNORMAL HIGH (ref 7–25)
CO2: 29 mmol/L (ref 20–32)
Calcium: 9.9 mg/dL (ref 8.6–10.4)
Chloride: 101 mmol/L (ref 98–110)
Creat: 1.25 mg/dL — ABNORMAL HIGH (ref 0.60–0.95)
Globulin: 2.9 g/dL (calc) (ref 1.9–3.7)
Glucose, Bld: 83 mg/dL (ref 65–99)
Potassium: 4.7 mmol/L (ref 3.5–5.3)
Sodium: 139 mmol/L (ref 135–146)
Total Bilirubin: 0.6 mg/dL (ref 0.2–1.2)
Total Protein: 6.7 g/dL (ref 6.1–8.1)
eGFR: 42 mL/min/{1.73_m2} — ABNORMAL LOW (ref >=60)

## 2021-03-18 LAB — CBC WITH DIFFERENTIAL/PLATELET
Absolute Monocytes: 748 cells/uL (ref 200–950)
Basophils Absolute: 27 cells/uL (ref 0–200)
Basophils Relative: 0.3 %
Eosinophils Absolute: 107 cells/uL (ref 15–500)
Eosinophils Relative: 1.2 %
HCT: 33.5 % — ABNORMAL LOW (ref 35.0–45.0)
Hemoglobin: 11.1 g/dL — ABNORMAL LOW (ref 11.7–15.5)
Lymphs Abs: 1397 cells/uL (ref 850–3900)
MCH: 29.9 pg (ref 27.0–33.0)
MCHC: 33.1 g/dL (ref 32.0–36.0)
MCV: 90.3 fL (ref 80.0–100.0)
MPV: 9.6 fL (ref 7.5–12.5)
Monocytes Relative: 8.4 %
Neutro Abs: 6622 cells/uL (ref 1500–7800)
Neutrophils Relative %: 74.4 %
Platelets: 294 10*3/uL (ref 140–400)
RBC: 3.71 10*6/uL — ABNORMAL LOW (ref 3.80–5.10)
RDW: 13.9 % (ref 11.0–15.0)
Total Lymphocyte: 15.7 %
WBC: 8.9 10*3/uL (ref 3.8–10.8)

## 2021-03-18 MED ORDER — FOLIC ACID 1 MG PO TABS: 1.0000 mg | ORAL_TABLET | Freq: Every day | ORAL | 3 refills | Status: AC

## 2021-03-18 MED ORDER — PREDNISONE 5 MG PO TABS: ORAL_TABLET | ORAL | 0 refills | Status: AC

## 2021-03-18 MED ORDER — METHOTREXATE 2.5 MG PO TABS: 5.0000 mg | ORAL_TABLET | ORAL | 0 refills | Status: DC

## 2021-03-18 NOTE — Patient Instructions (Signed)
Standing Labs We placed an order today for your standing lab work.   Please have your standing labs drawn in November and every 3 months  If possible, please have your labs drawn 2 weeks prior to your appointment so that the provider can discuss your results at your appointment.  Please note that you may see your imaging and lab results in MyChart before we have reviewed them. We may be awaiting multiple results to interpret others before contacting you. Please allow our office up to 72 hours to thoroughly review all of the results before contacting the office for clarification of your results.  We have open lab daily: Monday through Thursday from 1:30-4:30 PM and Friday from 1:30-4:00 PM at the office of Dr. Shaili Deveshwar, [facility].   Please be advised, all patients with office appointments requiring lab work will take precedent over walk-in lab work.  If possible, please come for your lab work on Monday and Friday afternoons, as you may experience shorter wait times. The office is located at [address], [address], [place], [place] [address] No appointment is necessary.   Labs are drawn by [facility]. Please bring your co-pay at the time of your lab draw.  You may receive a bill from [facility] for your lab work.  If you wish to have your labs drawn at another location, please call the office 24 hours in advance to send orders.  If you have any questions regarding directions or hours of operation,  please call [phone].   As a reminder, please drink plenty of water prior to coming for your lab work. Thanks!  

## 2021-03-18 NOTE — Telephone Encounter (Signed)
Patient's daughter [name] left a voicemail requesting the results from the labwork she had at her appointment be faxed to her PCP [facility] Attn: Clelia Croft. Fax #[phone]

## 2021-03-18 NOTE — Telephone Encounter (Signed)
Will fax once resulted.  

## 2021-03-19 NOTE — Progress Notes (Signed)
Reviewed lab work with Dr. Deveshwar.  Creatinine is elevated and continues to trend up-1.25. and eGFR is low-42.  Please advise the patient to discontinue methotrexate.  Ok to continue on prednisone 5 mg daily.

## 2021-03-19 NOTE — Telephone Encounter (Signed)
Faxed lab results to PCP, Dr. Clelia Croft.

## 2021-04-15 ENCOUNTER — Emergency Department (HOSPITAL_COMMUNITY)
Admission: EM | Admit: 2021-04-15 | Discharge: 2021-04-15 | Disposition: A | Discharge status: Home / Self Care | Payer: Medicare Other | Attending: Emergency Medicine | Admitting: Emergency Medicine

## 2021-04-15 ENCOUNTER — Encounter (HOSPITAL_COMMUNITY): Payer: Self-pay | Admitting: Emergency Medicine

## 2021-04-15 DIAGNOSIS — F0390 Unspecified dementia without behavioral disturbance: Secondary | ICD-10-CM | POA: Diagnosis not present

## 2021-04-15 DIAGNOSIS — Z7901 Long term (current) use of anticoagulants: Secondary | ICD-10-CM | POA: Insufficient documentation

## 2021-04-15 DIAGNOSIS — R011 Cardiac murmur, unspecified: Secondary | ICD-10-CM | POA: Insufficient documentation

## 2021-04-15 DIAGNOSIS — Z79899 Other long term (current) drug therapy: Secondary | ICD-10-CM | POA: Diagnosis not present

## 2021-04-15 DIAGNOSIS — N189 Chronic kidney disease, unspecified: Secondary | ICD-10-CM | POA: Insufficient documentation

## 2021-04-15 DIAGNOSIS — E860 Dehydration: Secondary | ICD-10-CM | POA: Diagnosis not present

## 2021-04-15 DIAGNOSIS — E039 Hypothyroidism, unspecified: Secondary | ICD-10-CM | POA: Insufficient documentation

## 2021-04-15 DIAGNOSIS — I129 Hypertensive chronic kidney disease with stage 1 through stage 4 chronic kidney disease, or unspecified chronic kidney disease: Secondary | ICD-10-CM | POA: Insufficient documentation

## 2021-04-15 DIAGNOSIS — Z8616 Personal history of COVID-19: Secondary | ICD-10-CM | POA: Diagnosis not present

## 2021-04-15 DIAGNOSIS — R531 Weakness: Secondary | ICD-10-CM

## 2021-04-15 DIAGNOSIS — I4891 Unspecified atrial fibrillation: Secondary | ICD-10-CM | POA: Insufficient documentation

## 2021-04-15 DIAGNOSIS — Z87891 Personal history of nicotine dependence: Secondary | ICD-10-CM | POA: Insufficient documentation

## 2021-04-15 DIAGNOSIS — N390 Urinary tract infection, site not specified: Secondary | ICD-10-CM | POA: Insufficient documentation

## 2021-04-15 LAB — URINALYSIS, ROUTINE W REFLEX MICROSCOPIC
Bilirubin Urine: NEGATIVE
Glucose, UA: NEGATIVE mg/dL
Ketones, ur: NEGATIVE mg/dL
Nitrite: POSITIVE — AB
Protein, ur: NEGATIVE mg/dL
Specific Gravity, Urine: 1.01 (ref 1.005–1.030)
pH: 6.5 (ref 5.0–8.0)

## 2021-04-15 LAB — BASIC METABOLIC PANEL
Anion gap: 11 (ref 5–15)
BUN: 28 mg/dL — ABNORMAL HIGH (ref 8–23)
CO2: 23 mmol/L (ref 22–32)
Calcium: 9.5 mg/dL (ref 8.9–10.3)
Chloride: 102 mmol/L (ref 98–111)
Creatinine, Ser: 1.12 mg/dL — ABNORMAL HIGH (ref 0.44–1.00)
GFR, Estimated: 48 mL/min — ABNORMAL LOW (ref >60)
Glucose, Bld: 102 mg/dL — ABNORMAL HIGH (ref 70–99)
Potassium: 4.4 mmol/L (ref 3.5–5.1)
Sodium: 136 mmol/L (ref 135–145)

## 2021-04-15 LAB — CBC
HCT: 36.2 % (ref 36.0–46.0)
Hemoglobin: 11.5 g/dL — ABNORMAL LOW (ref 12.0–15.0)
MCH: 29.5 pg (ref 26.0–34.0)
MCHC: 31.8 g/dL (ref 30.0–36.0)
MCV: 92.8 fL (ref 80.0–100.0)
Platelets: 329 10*3/uL (ref 150–400)
RBC: 3.9 MIL/uL (ref 3.87–5.11)
RDW: 13.4 % (ref 11.5–15.5)
WBC: 8.5 10*3/uL (ref 4.0–10.5)
nRBC: 0 % (ref 0.0–0.2)

## 2021-04-15 LAB — URINALYSIS, MICROSCOPIC (REFLEX)

## 2021-04-15 LAB — CBG MONITORING, ED: Glucose-Capillary: 106 mg/dL — ABNORMAL HIGH (ref 70–99)

## 2021-04-15 MED ORDER — CEPHALEXIN 500 MG PO CAPS: 500.0000 mg | ORAL_CAPSULE | Freq: Two times a day (BID) | ORAL | 0 refills | Status: AC

## 2021-04-15 MED ORDER — ACETAMINOPHEN 325 MG PO TABS
650.0000 mg | ORAL_TABLET | Freq: Once | ORAL | Status: AC
  Administered 2021-04-15: 650 mg via ORAL
  Filled 2021-04-15: qty 2

## 2021-04-15 MED ORDER — SODIUM CHLORIDE 0.9 % IV SOLN
INTRAVENOUS | Status: DC | PRN
  Administered 2021-04-15: 250 mL via INTRAVENOUS

## 2021-04-15 MED ORDER — SODIUM CHLORIDE 0.9 % IV BOLUS
500.0000 mL | Freq: Once | INTRAVENOUS | Status: AC
  Administered 2021-04-15: 500 mL via INTRAVENOUS

## 2021-04-15 MED ORDER — SODIUM CHLORIDE 0.9 % IV SOLN
1.0000 g | Freq: Once | INTRAVENOUS | Status: AC
  Administered 2021-04-15: 1 g via INTRAVENOUS
  Filled 2021-04-15: qty 10

## 2021-04-15 NOTE — ED Triage Notes (Signed)
Pt with generalized weakness increasing over the last month. Pt with hx of past UTIs. Today per family family hot, having chills. Pt alert, oriented x2. VSS.

## 2021-04-15 NOTE — ED Provider Notes (Signed)
[facility] EMERGENCY DEPARTMENT Provider Note   CSN: [id number] Arrival date & time: 04/15/21  1246     History Chief Complaint  Patient presents with   Weakness    Zoe Robinson is a 87 y.o. female.   Weakness  This patient is an 87 year old female, she has chronic atrial fibrillation and is currently anticoagulated with Eliquis, she also has a history of rheumatoid arthritis involving multiple joints and is currently being treated with low-dose prednisone at 5 mg daily.  She had been discontinued on her methotrexate recently secondary to some renal dysfunction.  The daughter who is the primary historian secondary to the patient's cognitive decline and what the daughter states is dementia states that she has had some generalized weakness which seems to have gotten worse in the last 24 hours.  The patient currently lives with her husband, there is a son that lives next door and the daughter lives 15 minutes away.  She has good support at home and the husband does help around the house.  There has been no fevers measured though she did have a temperature of 99.6 this morning, there has been no nausea vomiting or diarrhea and she denies any urinary symptoms or dysuria though her daughter does report a history of urinary tract infections.  There has been no coughing or shortness of breath or rashes or headache.  Level 5 caveat applies secondary to dementia  Past Medical History:  Diagnosis Date   Arthritis    Atrial fibrillation (HCC)    Chronic atrial fibrillation (HCC)    CKD (chronic kidney disease)    Hypertension    Hypothyroidism    Incontinence    RA (rheumatoid arthritis) (HCC)     Patient Active Problem List   Diagnosis Date Noted   COVID-19 virus infection 06/30/2019   High risk medication use 03/25/2017   Rheumatoid arthritis involving multiple sites with positive rheumatoid factor (HCC) 03/15/2017   Rheumatoid arthritis involving both knees with  positive rheumatoid factor (HCC) 03/15/2017   Rheumatoid arthritis involving both hands with positive rheumatoid factor (HCC) 03/15/2017   Rheumatoid arthritis involving both feet with positive rheumatoid factor (HCC) 03/15/2017   History of vertebral fracture 03/15/2017   On prednisone therapy 02/10/2017   Age-related osteoporosis without current pathological fracture 02/10/2017   History of pulmonary hypertension 02/10/2017   History of atrial fibrillation 02/10/2017   History of anemia 02/10/2017   History of recurrent UTIs 02/10/2017   History of hypercalcemia 02/10/2017   On anticoagulant therapy 02/10/2017   Acute encephalopathy 08/19/2014   Neutropenia (HCC) 04/20/2014   Fever, unspecified 04/20/2014   Unspecified constipation 04/20/2014   Hypercalcemia 04/15/2014   Acute renal failure (HCC) 04/15/2014   Generalized weakness 04/15/2014   Dehydration 04/15/2014   UTI (urinary tract infection) 04/15/2014   Iron deficiency anemia 04/15/2014   CKD (chronic kidney disease)    Hypertension    Atrial fibrillation (HCC)     Past Surgical History:  Procedure Laterality Date   ABDOMINAL HYSTERECTOMY  1990     OB History   No obstetric history on file.     Family History  Problem Relation Age of Onset   Heart attack Mother    Cancer Mother    Hypertension Mother    Heart disease Mother    Heart attack Father    Stomach cancer Father    Hypertension Father    Heart disease Father    Heart attack Brother    Heart attack  Brother    Stomach cancer Brother    Cancer Sister    Dementia Sister    Osteoarthritis Daughter    Depression Daughter    Hypothyroidism Daughter    Hypercholesterolemia Daughter    Heart disease Son    Hypertension Son    Gout Son     Social History   Tobacco Use   Smoking status: Former    Types: Cigarettes    Quit date: 04/15/1964    Years since quitting: 57.0   Smokeless tobacco: Never  Vaping Use   Vaping Use: Never used  Substance  Use Topics   Alcohol use: No   Drug use: No    Home Medications Prior to Admission medications   Medication Sig Start Date End Date Taking? Authorizing Provider  acetaminophen (TYLENOL) 500 MG tablet Take 1,000 mg by mouth as needed.    Yes [provider]  cephALEXin (KEFLEX) 500 MG capsule Take 1 capsule (500 mg total) by mouth 2 (two) times daily for 5 days. 04/15/21 04/20/21 Yes Eber Hong, MD  Cranberry 500 MG TABS Take 1 tablet by mouth 2 (two) times daily.   Yes [provider]  denosumab (PROLIA) 60 MG/ML SOLN injection Inject 60 mg into the skin every 6 (six) months. Administer in upper arm, thigh, or abdomen   Yes [provider]  donepezil (ARICEPT) 10 MG tablet Take 10 mg by mouth at bedtime.   Yes [provider]  feeding supplement, ENSURE COMPLETE, (ENSURE COMPLETE) LIQD Take 237 mLs by mouth 3 (three) times daily between meals. Patient taking differently: Take 237 mLs by mouth daily as needed (nutrition). 04/26/14  Yes Vann, Jessica U, DO  ferrous sulfate 325 (65 FE) MG tablet Take 325 mg by mouth daily with breakfast.   Yes [provider]  folic acid (FOLVITE) 1 MG tablet Take 1 tablet (1 mg total) by mouth daily. 03/18/21  Yes Gearldine Bienenstock, PA-C  irbesartan (AVAPRO) 300 MG tablet TK 1 T PO QD 05/05/18  Yes [provider]  Lactobacillus TABS Take 1 tablet by mouth daily.   Yes [provider]  Magnesium 250 MG TABS Take 1 tablet by mouth daily.   Yes [provider]  mirabegron ER (MYRBETRIQ) 50 MG TB24 tablet Take 50 mg by mouth daily.   Yes [provider]  Multiple Vitamins-Minerals (MULTIVITAMIN PO) Take 1 tablet by mouth every morning.    Yes [provider]  nitroGLYCERIN (NITROSTAT) 0.4 MG SL tablet Place 0.4 mg under the tongue every 5 (five) minutes as needed for chest pain.   Yes [provider]  NP THYROID 15 MG tablet Take 15 mg by mouth daily. 04/03/21  Yes  [provider]  oxybutynin (DITROPAN-XL) 10 MG 24 hr tablet Take 10 mg by mouth every morning.    Yes [provider]  polyethylene glycol (MIRALAX / GLYCOLAX) packet Take 17 g by mouth daily as needed. Patient taking differently: Take 17 g by mouth daily as needed for mild constipation. 04/26/14  Yes Vann, Jessica U, DO  predniSONE (DELTASONE) 5 MG tablet TAKE 1 TABLET(5 MG) BY MOUTH DAILY WITH BREAKFAST 03/18/21  Yes Gearldine Bienenstock, PA-C  psyllium (METAMUCIL) 58.6 % packet Take 1 packet by mouth daily as needed (fiber).   Yes [provider]  simvastatin (ZOCOR) 40 MG tablet Take 40 mg by mouth every evening.   Yes [provider]  vitamin E 400 UNIT capsule Take 400 Units by mouth  daily.   Yes [provider]  XARELTO 20 MG TABS tablet Take 20 mg by mouth every evening. 10/25/19  Yes [provider]  methotrexate (RHEUMATREX) 2.5 MG tablet TAKE 2 TABLETS(5 MG) BY MOUTH 1 TIME A WEEK Patient not taking: No sig reported 07/21/20   Pollyann Savoy, MD  methotrexate (RHEUMATREX) 2.5 MG tablet Take 2 tablets (5 mg total) by mouth once a week. Caution:Chemotherapy. Protect from light. Patient not taking: No sig reported 03/18/21   Gearldine Bienenstock, PA-C    Allergies    Patient has no known allergies.  Review of Systems   Review of Systems  Unable to perform ROS: Dementia  Neurological:  Positive for weakness.   Physical Exam Updated Vital Signs BP 140/83   Pulse 66   Temp 100 F (37.8 C)   Resp 19   Ht 1.499 m (4\' 11" )   Wt 59 kg   SpO2 97%   BMI 26.26 kg/m   Physical Exam Vitals and nursing note reviewed.  Constitutional:      General: She is not in acute distress.    Appearance: She is well-developed.  HENT:     Head: Normocephalic and atraumatic.     Mouth/Throat:     Pharynx: No oropharyngeal exudate.  Eyes:     General: No scleral icterus.       Right eye: No discharge.        Left eye: No discharge.      Conjunctiva/sclera: Conjunctivae normal.     Pupils: Pupils are equal, round, and reactive to light.  Neck:     Thyroid: No thyromegaly.     Vascular: No JVD.  Cardiovascular:     Rate and Rhythm: Normal rate and regular rhythm.     Heart sounds: Murmur heard.    No friction rub. No gallop.     Comments: Occasional ectopic beat but sinus rhythm with good pulses otherwise, there is a systolic murmur present Pulmonary:     Effort: Pulmonary effort is normal. No respiratory distress.     Breath sounds: Normal breath sounds. No wheezing or rales.  Abdominal:     General: Bowel sounds are normal. There is no distension.     Palpations: Abdomen is soft. There is no mass.     Tenderness: There is no abdominal tenderness.  Musculoskeletal:        General: No tenderness. Normal range of motion.     Cervical back: Normal range of motion and neck supple.     Right lower leg: No edema.     Left lower leg: No edema.     Comments: Rheumatoid deformities of the bilateral hands  Lymphadenopathy:     Cervical: No cervical adenopathy.  Skin:    General: Skin is warm and dry.     Findings: No erythema or rash.  Neurological:     Mental Status: She is alert.     Coordination: Coordination normal.     Comments: There is a slight tremor of the bilateral hands, the patient has this chronically.  She is able to answer my questions, she does have some confusion and disorientation but otherwise is able to follow commands symmetrically, no facial droop, clear speech  Psychiatric:        Behavior: Behavior normal.    ED Results / Procedures / Treatments   Labs (all labs ordered are listed, but only abnormal results are displayed) Labs Reviewed  BASIC METABOLIC PANEL - Abnormal; Notable for the  following components:      Result Value   Glucose, Bld 102 (*)    BUN 28 (*)    Creatinine, Ser 1.12 (*)    GFR, Estimated 48 (*)    All other components within normal limits  CBC - Abnormal; Notable for the  following components:   Hemoglobin 11.5 (*)    All other components within normal limits  URINALYSIS, ROUTINE W REFLEX MICROSCOPIC - Abnormal; Notable for the following components:   APPearance HAZY (*)    Hgb urine dipstick TRACE (*)    Nitrite POSITIVE (*)    Leukocytes,Ua MODERATE (*)    All other components within normal limits  URINALYSIS, MICROSCOPIC (REFLEX) - Abnormal; Notable for the following components:   Bacteria, UA FEW (*)    All other components within normal limits  CBG MONITORING, ED - Abnormal; Notable for the following components:   Glucose-Capillary 106 (*)    All other components within normal limits  URINE CULTURE    EKG EKG Interpretation  Date/Time:  Wednesday April 15 2021 14:14:37 EDT Ventricular Rate:  87 PR Interval:  142 QRS Duration: 80 QT Interval:  308 QTC Calculation: 370 R Axis:   -4 Text Interpretation: Normal sinus rhythm with sinus arrhythmia Anterior infarct , age undetermined Abnormal ECG Confirmed by Eber Hong ([id number]) on 04/15/2021 5:11:57 PM  Radiology No results found.  Procedures Procedures   Medications Ordered in ED Medications  0.9 %  sodium chloride infusion (250 mLs Intravenous New Bag/Given 04/15/21 1846)  acetaminophen (TYLENOL) tablet 650 mg (650 mg Oral Given 04/15/21 1830)  cefTRIAXone (ROCEPHIN) 1 g in sodium chloride 0.9 % 100 mL IVPB ( Intravenous Restarted 04/15/21 1855)  sodium chloride 0.9 % bolus 500 mL (500 mLs Intravenous New Bag/Given 04/15/21 1845)    ED Course  I have reviewed the triage vital signs and the nursing notes.  Pertinent labs & imaging results that were available during my care of the patient were reviewed by me and considered in my medical decision making (see chart for details).    MDM Rules/Calculators/A&P                           Thankfully the CBC is unremarkable without any severe leukocytosis, renal function is also unremarkable with a creatinine of 1.12.  Electrolytes are  reassuring, urinalysis does reveal what appears to be a urinary tract infection, culture will be ordered and the patient will be given IV fluids and antibiotics, I anticipate the ability to discharge home.  She does have a low-grade temperature of 100 degrees, she does not meet criteria for sepsis at this time and as she is well-appearing anticipate discharge  This patient appears well, labs reviewed with the family members, IV fluids given, no tachycardia, patient well-appearing, no weakness on exam, treated for UTI with Rocephin and IV fluids, stable for discharge, family agreeable to the plan  Final Clinical Impression(s) / ED Diagnoses Final diagnoses:  Urinary tract infection without hematuria, site unspecified  Weakness  Dehydration    Rx / DC Orders ED Discharge Orders          Ordered    cephALEXin (KEFLEX) 500 MG capsule  2 times daily        04/15/21 1956             Eber Hong, MD 04/15/21 1958

## 2021-04-15 NOTE — ED Provider Notes (Signed)
Emergency Medicine Provider Triage Evaluation Note  Zoe Robinson , a 87 y.o. female  with dementia was evaluated in triage with daughter. The patient is asymptomatic. The daughter mentions she has been having gradually worsening generalized weakness over the past month. The daughter mentions she has had UTIs in the past without symptoms, and wants this investigated further.  Review of Systems  Positive:  Negative:  Limited 2/2 dementia. Daughter at bedside.   Physical Exam  BP (!) 140/109 (BP Location: Left Arm)   Pulse 88   Temp 100 F (37.8 C)   Resp 15   Ht 4\' 11"  (1.499 m)   Wt 59 kg   SpO2 97%   BMI 26.26 kg/m  Gen:   Awake, no distress  A&Ox2 Resp:  Normal effort  MSK:   Moves extremities without difficulty . Equal strength in bilateral upper and lower extremities. No focal weakness.  Other: No abdominal tenderness  Medical Decision Making  Medically screening exam initiated at 2:04 PM.  Appropriate orders placed.  was informed that the remainder of the evaluation will be completed by another provider, this initial triage assessment does not replace that evaluation, and the importance of remaining in the ED until their evaluation is complete.  Patient does not have any focal deficits and is in no acute distress. The patient is stable for continuation of care from another provider.    [name], PA-C 04/15/21 1413    04/17/21, MD 04/17/21 2350

## 2021-04-15 NOTE — Discharge Instructions (Signed)
Your testing today shows that you do have a urinary tract infection.  I would like for you to finish the treatment with cephalexin twice a day for the next 5 days.  Please drink plenty of clear liquids but return to the emergency department immediately for severe or worsening symptoms  Please follow-up with your doctor within the next 2 or 3 days to discuss all of your results, we will call you for any testing that changes the direction of the antibiotics including the urine culture which will not be back for 48 hours.

## 2021-04-15 NOTE — ED Notes (Signed)
Pt on monitor, changed into gown. Pt has no complaints at this time. Daughter at bedside

## 2021-04-15 NOTE — ED Notes (Signed)
ED Provider at bedside. 

## 2021-04-15 NOTE — ED Notes (Signed)
Spoke with [facility] to add on urine culture

## 2021-04-17 LAB — URINE CULTURE: Culture: >=100000 — AB

## 2021-04-19 ENCOUNTER — Telehealth: Payer: Self-pay | Admitting: Emergency Medicine

## 2021-04-19 NOTE — Telephone Encounter (Signed)
Post ED Visit - Positive Culture Follow-up  Culture report reviewed by antimicrobial stewardship pharmacist: [facility] []  , Pharm.D. []  Enzo Bi, .D., BCPS AQ-ID []  Celedonio Miyamoto, Pharm.D., BCPS []  [address], Pharm.D., BCPS []  [place], Garvin Fila.D., BCPS, AAHIVP []  , Pharm.D., BCPS, AAHIVP []  Georgina Pillion, PharmD, BCPS []  , PharmD, BCPS []  [place], PharmD, BCPS [x]  [address], PharmD []  , PharmD, BCPS []  Estella Husk, PharmD  Pharmacy Team []  Lysle Pearl, PharmD []  , PharmD []  Phillips Climes, PharmD []  , Rph []  Agapito Games) , PharmD []  Delmar Landau, PharmD []  , PharmD []  Mervyn Gay, PharmD []  , PharmD []  Vinnie Level, PharmD []  [facility], PharmD []  , PharmD []  Len Childs, PharmD   Positive urine culture Treated with Cephalexin, organism sensitive to the same and no further patient follow-up is required at this time.  Avamae Dehaan 04/19/2021, 12:00 PM

## 2021-04-21 ENCOUNTER — Emergency Department (HOSPITAL_COMMUNITY)
Admission: EM | Admit: 2021-04-21 | Discharge: 2021-04-21 | Disposition: A | Discharge status: Home / Self Care | Payer: Medicare Other | Attending: Emergency Medicine | Admitting: Emergency Medicine

## 2021-04-21 ENCOUNTER — Encounter (HOSPITAL_COMMUNITY): Payer: Self-pay

## 2021-04-21 DIAGNOSIS — Z8616 Personal history of COVID-19: Secondary | ICD-10-CM | POA: Diagnosis not present

## 2021-04-21 DIAGNOSIS — Z87891 Personal history of nicotine dependence: Secondary | ICD-10-CM | POA: Insufficient documentation

## 2021-04-21 DIAGNOSIS — E039 Hypothyroidism, unspecified: Secondary | ICD-10-CM | POA: Diagnosis not present

## 2021-04-21 DIAGNOSIS — N189 Chronic kidney disease, unspecified: Secondary | ICD-10-CM | POA: Insufficient documentation

## 2021-04-21 DIAGNOSIS — F0390 Unspecified dementia without behavioral disturbance: Secondary | ICD-10-CM | POA: Diagnosis not present

## 2021-04-21 DIAGNOSIS — Z79899 Other long term (current) drug therapy: Secondary | ICD-10-CM | POA: Diagnosis not present

## 2021-04-21 DIAGNOSIS — I129 Hypertensive chronic kidney disease with stage 1 through stage 4 chronic kidney disease, or unspecified chronic kidney disease: Secondary | ICD-10-CM | POA: Insufficient documentation

## 2021-04-21 DIAGNOSIS — D649 Anemia, unspecified: Secondary | ICD-10-CM | POA: Insufficient documentation

## 2021-04-21 DIAGNOSIS — Z7901 Long term (current) use of anticoagulants: Secondary | ICD-10-CM | POA: Insufficient documentation

## 2021-04-21 DIAGNOSIS — R251 Tremor, unspecified: Secondary | ICD-10-CM | POA: Diagnosis not present

## 2021-04-21 DIAGNOSIS — R531 Weakness: Secondary | ICD-10-CM | POA: Diagnosis present

## 2021-04-21 LAB — CBC WITH DIFFERENTIAL/PLATELET
Abs Immature Granulocytes: 0.04 10*3/uL (ref 0.00–0.07)
Basophils Absolute: 0 10*3/uL (ref 0.0–0.1)
Basophils Relative: 0 %
Eosinophils Absolute: 0 10*3/uL (ref 0.0–0.5)
Eosinophils Relative: 0 %
HCT: 32.3 % — ABNORMAL LOW (ref 36.0–46.0)
Hemoglobin: 10.7 g/dL — ABNORMAL LOW (ref 12.0–15.0)
Immature Granulocytes: 0 %
Lymphocytes Relative: 18 %
Lymphs Abs: 1.7 10*3/uL (ref 0.7–4.0)
MCH: 29.5 pg (ref 26.0–34.0)
MCHC: 33.1 g/dL (ref 30.0–36.0)
MCV: 89 fL (ref 80.0–100.0)
Monocytes Absolute: 0.4 10*3/uL (ref 0.1–1.0)
Monocytes Relative: 4 %
Neutro Abs: 7.2 10*3/uL (ref 1.7–7.7)
Neutrophils Relative %: 78 %
Platelets: 364 10*3/uL (ref 150–400)
RBC: 3.63 MIL/uL — ABNORMAL LOW (ref 3.87–5.11)
RDW: 13 % (ref 11.5–15.5)
WBC: 9.4 10*3/uL (ref 4.0–10.5)
nRBC: 0 % (ref 0.0–0.2)

## 2021-04-21 LAB — URINALYSIS, ROUTINE W REFLEX MICROSCOPIC
Bilirubin Urine: NEGATIVE
Glucose, UA: NEGATIVE mg/dL
Hgb urine dipstick: NEGATIVE
Ketones, ur: NEGATIVE mg/dL
Leukocytes,Ua: NEGATIVE
Nitrite: NEGATIVE
Protein, ur: NEGATIVE mg/dL
Specific Gravity, Urine: 1.01 (ref 1.005–1.030)
pH: 7.5 (ref 5.0–8.0)

## 2021-04-21 LAB — COMPREHENSIVE METABOLIC PANEL
ALT: 20 U/L (ref 0–44)
AST: 27 U/L (ref 15–41)
Albumin: 3.3 g/dL — ABNORMAL LOW (ref 3.5–5.0)
Alkaline Phosphatase: 42 U/L (ref 38–126)
Anion gap: 9 (ref 5–15)
BUN: 28 mg/dL — ABNORMAL HIGH (ref 8–23)
CO2: 30 mmol/L (ref 22–32)
Calcium: 9.9 mg/dL (ref 8.9–10.3)
Chloride: 101 mmol/L (ref 98–111)
Creatinine, Ser: 0.94 mg/dL (ref 0.44–1.00)
GFR, Estimated: 59 mL/min — ABNORMAL LOW (ref >60)
Glucose, Bld: 139 mg/dL — ABNORMAL HIGH (ref 70–99)
Potassium: 4.9 mmol/L (ref 3.5–5.1)
Sodium: 140 mmol/L (ref 135–145)
Total Bilirubin: 0.5 mg/dL (ref 0.3–1.2)
Total Protein: 7.4 g/dL (ref 6.5–8.1)

## 2021-04-21 LAB — MAGNESIUM: Magnesium: 2.4 mg/dL (ref 1.7–2.4)

## 2021-04-21 LAB — TROPONIN I (HIGH SENSITIVITY): Troponin I (High Sensitivity): 7 ng/L (ref <18)

## 2021-04-21 LAB — TSH: TSH: 1.51 u[IU]/mL (ref 0.350–4.500)

## 2021-04-21 NOTE — ED Provider Notes (Signed)
[facility]-EMERGENCY DEPT Provider Note   CSN: [id number] Arrival date & time: 04/21/21  1333     History Chief Complaint  Patient presents with   Weakness    uti    Zoe Robinson is a 87 y.o. female.   Weakness  87 year old female with a history of chronic atrial fibrillation, anticoagulated on Eliquis, rheumatoid arthritis, recent diagnosis of urinary tract infection treated with antibiotics, tremor who presents to the emergency department with concern for generalized weakness and functional decline.  The patient lives with her husband and has good support at home.  She has been afebrile with no infectious symptoms.  She was brought to the emergency department due to concern for increasing weakness.  She denies any urinary symptoms at this time.  She has known dementia.  Level 5 caveat applies.  Past Medical History:  Diagnosis Date   Arthritis    Atrial fibrillation (HCC)    Chronic atrial fibrillation (HCC)    CKD (chronic kidney disease)    Hypertension    Hypothyroidism    Incontinence    RA (rheumatoid arthritis) (HCC)     Patient Active Problem List   Diagnosis Date Noted   COVID-19 virus infection 06/30/2019   High risk medication use 03/25/2017   Rheumatoid arthritis involving multiple sites with positive rheumatoid factor (HCC) 03/15/2017   Rheumatoid arthritis involving both knees with positive rheumatoid factor (HCC) 03/15/2017   Rheumatoid arthritis involving both hands with positive rheumatoid factor (HCC) 03/15/2017   Rheumatoid arthritis involving both feet with positive rheumatoid factor (HCC) 03/15/2017   History of vertebral fracture 03/15/2017   On prednisone therapy 02/10/2017   Age-related osteoporosis without current pathological fracture 02/10/2017   History of pulmonary hypertension 02/10/2017   History of atrial fibrillation 02/10/2017   History of anemia 02/10/2017   History of recurrent UTIs 02/10/2017   History of  hypercalcemia 02/10/2017   On anticoagulant therapy 02/10/2017   Acute encephalopathy 08/19/2014   Neutropenia (HCC) 04/20/2014   Fever, unspecified 04/20/2014   Unspecified constipation 04/20/2014   Hypercalcemia 04/15/2014   Acute renal failure (HCC) 04/15/2014   Generalized weakness 04/15/2014   Dehydration 04/15/2014   UTI (urinary tract infection) 04/15/2014   Iron deficiency anemia 04/15/2014   CKD (chronic kidney disease)    Hypertension    Atrial fibrillation (HCC)     Past Surgical History:  Procedure Laterality Date   ABDOMINAL HYSTERECTOMY  1990     OB History   No obstetric history on file.     Family History  Problem Relation Age of Onset   Heart attack Mother    Cancer Mother    Hypertension Mother    Heart disease Mother    Heart attack Father    Stomach cancer Father    Hypertension Father    Heart disease Father    Heart attack Brother    Heart attack Brother    Stomach cancer Brother    Cancer Sister    Dementia Sister    Osteoarthritis Daughter    Depression Daughter    Hypothyroidism Daughter    Hypercholesterolemia Daughter    Heart disease Son    Hypertension Son    Gout Son     Social History   Tobacco Use   Smoking status: Former    Types: Cigarettes    Quit date: 04/15/1964    Years since quitting: 57.0   Smokeless tobacco: Never  Vaping Use   Vaping Use: Never used  Substance  Use Topics   Alcohol use: No   Drug use: No    Home Medications Prior to Admission medications   Medication Sig Start Date End Date Taking? Authorizing Provider  acetaminophen (TYLENOL) 500 MG tablet Take 1,000 mg by mouth as needed.    Yes [provider]  Cranberry 500 MG TABS Take 1 tablet by mouth 2 (two) times daily.   Yes [provider]  denosumab (PROLIA) 60 MG/ML SOLN injection Inject 60 mg into the skin every 6 (six) months. Administer in upper arm, thigh, or abdomen   Yes [provider]  donepezil (ARICEPT) 10  MG tablet Take 10 mg by mouth at bedtime.   Yes [provider]  ferrous sulfate 325 (65 FE) MG tablet Take 325 mg by mouth daily with breakfast.   Yes [provider]  folic acid (FOLVITE) 1 MG tablet Take 1 tablet (1 mg total) by mouth daily. 03/18/21  Yes Gearldine Bienenstock, PA-C  irbesartan (AVAPRO) 300 MG tablet Take 300 mg by mouth daily. 05/05/18  Yes [provider]  Lactobacillus TABS Take 1 tablet by mouth daily.   Yes [provider]  Magnesium 250 MG TABS Take 1 tablet by mouth daily.   Yes [provider]  mirabegron ER (MYRBETRIQ) 50 MG TB24 tablet Take 50 mg by mouth daily.   Yes [provider]  Multiple Vitamins-Minerals (MULTIVITAMIN PO) Take 1 tablet by mouth every morning.    Yes [provider]  nitroGLYCERIN (NITROSTAT) 0.4 MG SL tablet Place 0.4 mg under the tongue every 5 (five) minutes as needed for chest pain.   Yes [provider]  NP THYROID 15 MG tablet Take 15 mg by mouth daily. 04/03/21  Yes [provider]  oxybutynin (DITROPAN-XL) 10 MG 24 hr tablet Take 10 mg by mouth every morning.    Yes [provider]  predniSONE (DELTASONE) 5 MG tablet TAKE 1 TABLET(5 MG) BY MOUTH DAILY WITH BREAKFAST Patient taking differently: Take 5 mg by mouth daily with breakfast. 03/18/21  Yes Gearldine Bienenstock, PA-C  psyllium (METAMUCIL) 58.6 % packet Take 1 packet by mouth daily as needed (fiber).   Yes [provider]  simvastatin (ZOCOR) 40 MG tablet Take 40 mg by mouth every evening.   Yes [provider]  vitamin E 400 UNIT capsule Take 400 Units by mouth daily.   Yes [provider]  XARELTO 20 MG TABS tablet Take 20 mg by mouth every evening. 10/25/19  Yes [provider]  feeding supplement, ENSURE COMPLETE, (ENSURE COMPLETE) LIQD Take 237 mLs by mouth 3 (three) times daily between meals. Patient taking differently: Take 237 mLs by mouth daily as needed (nutrition).  04/26/14   Joseph Art, DO  methotrexate (RHEUMATREX) 2.5 MG tablet TAKE 2 TABLETS(5 MG) BY MOUTH 1 TIME A WEEK Patient not taking: No sig reported 07/21/20   Pollyann Savoy, MD  methotrexate (RHEUMATREX) 2.5 MG tablet Take 2 tablets (5 mg total) by mouth once a week. Caution:Chemotherapy. Protect from light. Patient not taking: No sig reported 03/18/21   Gearldine Bienenstock, PA-C  polyethylene glycol [facility] / GLYCOLAX) packet Take 17 g by mouth daily as needed. Patient not taking: Reported on 04/21/2021 04/26/14   Joseph Art, DO    Allergies    Patient has no known allergies.  Review of Systems   Review of Systems  Unable to perform ROS: Dementia  Neurological:  Positive for weakness.   Physical Exam  Updated Vital Signs BP (!) 169/92 (BP Location: Right Arm)   Pulse 72   Temp 98.5 F (36.9 C) (Oral)   Resp 20   SpO2 97%   Physical Exam Vitals and nursing note reviewed.  Constitutional:      General: She is not in acute distress.    Appearance: She is well-developed.  HENT:     Head: Normocephalic and atraumatic.  Eyes:     Conjunctiva/sclera: Conjunctivae normal.  Cardiovascular:     Rate and Rhythm: Normal rate and regular rhythm.     Heart sounds: No murmur heard. Pulmonary:     Effort: Pulmonary effort is normal. No respiratory distress.     Breath sounds: Normal breath sounds.  Abdominal:     Palpations: Abdomen is soft.     Tenderness: There is no abdominal tenderness.  Musculoskeletal:     Cervical back: Neck supple.  Skin:    General: Skin is warm and dry.  Neurological:     General: No focal deficit present.     Mental Status: She is alert. Mental status is at baseline.     Cranial Nerves: No cranial nerve deficit.     Sensory: No sensory deficit.     Motor: No weakness.     Coordination: Coordination normal.     Comments: Resting and intention tremor noted in the right hand    ED Results / Procedures / Treatments   Labs (all labs ordered are  listed, but only abnormal results are displayed) Labs Reviewed  CBC WITH DIFFERENTIAL/PLATELET - Abnormal; Notable for the following components:      Result Value   RBC 3.63 (*)    Hemoglobin 10.7 (*)    HCT 32.3 (*)    All other components within normal limits  COMPREHENSIVE METABOLIC PANEL - Abnormal; Notable for the following components:   Glucose, Bld 139 (*)    BUN 28 (*)    Albumin 3.3 (*)    GFR, Estimated 59 (*)    All other components within normal limits  URINALYSIS, ROUTINE W REFLEX MICROSCOPIC  TSH  MAGNESIUM  TROPONIN I (HIGH SENSITIVITY)    EKG EKG Interpretation  Date/Time:  Tuesday April 21 2021 15:26:27 EDT Ventricular Rate:  82 PR Interval:  58 QRS Duration: 100 QT Interval:  379 QTC Calculation: 440 R Axis:   7 Text Interpretation: Unknown rhythm, irregular rate Short PR interval Low voltage, precordial leads Flattened T waves in the precordial leads Confirmed by Ernie Avena ([id number]) on 04/21/2021 5:05:15 PM  Radiology No results found.  Procedures Procedures   Medications Ordered in ED Medications - No data to display  ED Course  I have reviewed the triage vital signs and the nursing notes.  Pertinent labs & imaging results that were available during my care of the patient were reviewed by me and considered in my medical decision making (see chart for details).    MDM Rules/Calculators/A&P                           87 year old female with a history of chronic atrial fibrillation, anticoagulated on Eliquis, rheumatoid arthritis, recent diagnosis of urinary tract infection treated with antibiotics, tremor who presents to the emergency department with concern for generalized weakness and functional decline.  The patient lives with her husband and has good support at home.  She has been afebrile with no infectious symptoms.  She was brought to the emergency department due to concern  for increasing weakness.  She denies any urinary symptoms at this  time.  She has known dementia.   Family state that the patient has had increasing difficulty with her ADLs.  She has had difficulty with her tremor worsening over the past several months.  This is a chronic issue that was worked up by neurology a year ago.  She was evaluated for possible Parkinson's but did not meet diagnostic criteria.  She has become progressively more weak with shuffling gait.  She has difficulty transitioning from a seated position to standing and requires increasing assistance with her ADLs.  That being said, she still has good with the help of her family members.  Family are comfortable caring for the patient in the home.  She has had no strokelike symptoms.  No facial droop, dysarthria, dysphagia, focal numbness or weakness.  She persistently has a tremor which has worsened over the past year.  This has made eating more difficult and challenging.  No acute neurologic changes with a reassuring neurologic exam today.  Low suspicion for acute CVA.  Will undertake screening work-up to include urinalysis, TSH, magnesium, troponin, CMP, CBC.  Given the lack of new neurologic symptoms, I do not believe that CT imaging of the head is warranted at this time.  The patient endorses no infectious symptoms.  I do not think further CT or chest x-ray imaging is warranted.  CBC was without a leukocytosis, stable anemia to 10.7.  A CMP did not reveal significant electrolyte abnormality, mildly elevated BUN to 28, creatinine at baseline at 0.94.  Normal renal and liver function.  No elevation of the patient's troponin the patient denies any chest pain.  EKG without evidence of ischemic changes.  Magnesium normal, TSH normal.  Urinalysis without UTI.  The patient's UTI appears to have cleared.  Her symptoms are possibly due to worsening tremor.  With a shuffling gait and tremor and dementia that has progressed, I recommended that the patient's family take the patient back for reassessment with neurology for  outpatient follow-up.  I explained the negative work-up to family members bedside.  She has good support at home despite further difficulty with ADLs.  I feel that overall the patient is well-appearing with a reassuring work-up and exam.  Vitals significant for mild hypertension without symptoms.  Overall stable for discharge with plan for outpatient neurology follow-up. Final Clinical Impression(s) / ED Diagnoses Final diagnoses:  Weakness  Tremor    Rx / DC Orders ED Discharge Orders     None        Ernie Avena, MD 04/22/21 1440

## 2021-04-21 NOTE — Discharge Instructions (Addendum)
Your laboratory evaluation today did not reveal any significant abnormality.  You do have an anemia but not enough to cause significant symptoms and your hemoglobin actually appears to be chronically low and close to your baseline.  Follow-up with your thyroid function test on our patient portal.  Recommend that you reestablish care for repeat evaluation with neurology for your tremor.

## 2021-04-21 NOTE — ED Provider Notes (Signed)
Emergency Medicine Provider Triage Evaluation Note  Zoe Robinson , a 87 y.o. female  was evaluated in triage.  Pt complains of weakness. Daughter at bedside states pt was dx with uti last week. Started on abx but has continued to worsen.  Review of Systems  Positive: weakness Negative: vomiting  Physical Exam  BP (!) 156/73 (BP Location: Left Arm)   Pulse 80   Temp 99.3 F (37.4 C) (Oral)   Resp 14   SpO2 97%  Gen:   Awake, no distress   Resp:  Normal effort  MSK:   Moves extremities without difficulty  Other:  Heart murmur  Medical Decision Making  Medically screening exam initiated at 2:24 PM.  Appropriate orders placed.  [name] was informed that the remainder of the evaluation will be completed by another provider, this initial triage assessment does not replace that evaluation, and the importance of remaining in the ED until their evaluation is complete.     Rayne Du 04/21/21 1426    Glendora Score, MD 04/21/21 1756

## 2021-04-21 NOTE — ED Triage Notes (Signed)
Pt brought in by daughter. Daughter reports patient was seen at [facility] last Wednesday and diagnosed with UTI.   Pt was given IV antibiotics and sent home with PO meds. Daughter states patient is having increased weakness.   Pt in Wheelchair and alert

## 2021-04-21 NOTE — ED Notes (Signed)
Purewick placed will give sample when able too. °

## 2021-08-24 DIAGNOSIS — I129 Hypertensive chronic kidney disease with stage 1 through stage 4 chronic kidney disease, or unspecified chronic kidney disease: Secondary | ICD-10-CM | POA: Diagnosis not present

## 2021-08-24 DIAGNOSIS — Z7901 Long term (current) use of anticoagulants: Secondary | ICD-10-CM | POA: Diagnosis not present

## 2021-08-24 DIAGNOSIS — E860 Dehydration: Secondary | ICD-10-CM | POA: Diagnosis not present

## 2021-08-24 DIAGNOSIS — N189 Chronic kidney disease, unspecified: Secondary | ICD-10-CM | POA: Diagnosis not present

## 2021-08-24 DIAGNOSIS — I4891 Unspecified atrial fibrillation: Secondary | ICD-10-CM | POA: Diagnosis not present

## 2021-08-24 DIAGNOSIS — M542 Cervicalgia: Secondary | ICD-10-CM | POA: Insufficient documentation

## 2021-08-24 DIAGNOSIS — E039 Hypothyroidism, unspecified: Secondary | ICD-10-CM | POA: Diagnosis not present

## 2021-08-24 DIAGNOSIS — Z79899 Other long term (current) drug therapy: Secondary | ICD-10-CM | POA: Diagnosis not present

## 2021-08-25 ENCOUNTER — Encounter (HOSPITAL_COMMUNITY): Payer: Self-pay | Admitting: *Deleted

## 2021-08-25 ENCOUNTER — Other Ambulatory Visit: Payer: Self-pay

## 2021-08-25 ENCOUNTER — Emergency Department (HOSPITAL_COMMUNITY): Payer: Medicare Other

## 2021-08-25 ENCOUNTER — Emergency Department (HOSPITAL_COMMUNITY)
Admission: EM | Admit: 2021-08-25 | Discharge: 2021-08-25 | Disposition: A | Discharge status: Home / Self Care | Payer: Medicare Other | Attending: Emergency Medicine | Admitting: Emergency Medicine

## 2021-08-25 DIAGNOSIS — M542 Cervicalgia: Secondary | ICD-10-CM

## 2021-08-25 LAB — CBC WITH DIFFERENTIAL/PLATELET
Abs Immature Granulocytes: 0.03 10*3/uL (ref 0.00–0.07)
Basophils Absolute: 0 10*3/uL (ref 0.0–0.1)
Basophils Relative: 0 %
Eosinophils Absolute: 0.1 10*3/uL (ref 0.0–0.5)
Eosinophils Relative: 1 %
HCT: 34.8 % — ABNORMAL LOW (ref 36.0–46.0)
Hemoglobin: 11.6 g/dL — ABNORMAL LOW (ref 12.0–15.0)
Immature Granulocytes: 0 %
Lymphocytes Relative: 18 %
Lymphs Abs: 2.1 10*3/uL (ref 0.7–4.0)
MCH: 26.9 pg (ref 26.0–34.0)
MCHC: 33.3 g/dL (ref 30.0–36.0)
MCV: 80.6 fL (ref 80.0–100.0)
Monocytes Absolute: 1.1 10*3/uL — ABNORMAL HIGH (ref 0.1–1.0)
Monocytes Relative: 10 %
Neutro Abs: 8 10*3/uL — ABNORMAL HIGH (ref 1.7–7.7)
Neutrophils Relative %: 71 %
Platelets: 364 10*3/uL (ref 150–400)
RBC: 4.32 MIL/uL (ref 3.87–5.11)
RDW: 14.9 % (ref 11.5–15.5)
WBC: 11.4 10*3/uL — ABNORMAL HIGH (ref 4.0–10.5)
nRBC: 0 % (ref 0.0–0.2)

## 2021-08-25 LAB — BASIC METABOLIC PANEL
Anion gap: 8 (ref 5–15)
BUN: 39 mg/dL — ABNORMAL HIGH (ref 8–23)
CO2: 27 mmol/L (ref 22–32)
Calcium: 9.3 mg/dL (ref 8.9–10.3)
Chloride: 101 mmol/L (ref 98–111)
Creatinine, Ser: 1.07 mg/dL — ABNORMAL HIGH (ref 0.44–1.00)
GFR, Estimated: 50 mL/min — ABNORMAL LOW (ref >60)
Glucose, Bld: 111 mg/dL — ABNORMAL HIGH (ref 70–99)
Potassium: 4.3 mmol/L (ref 3.5–5.1)
Sodium: 136 mmol/L (ref 135–145)

## 2021-08-25 MED ORDER — ACETAMINOPHEN 325 MG PO TABS
650.0000 mg | ORAL_TABLET | Freq: Once | ORAL | Status: DC
  Filled 2021-08-25: qty 2

## 2021-08-25 MED ORDER — FENTANYL CITRATE PF 50 MCG/ML IJ SOSY
25.0000 ug | PREFILLED_SYRINGE | Freq: Once | INTRAMUSCULAR | Status: AC
  Administered 2021-08-25: 25 ug via INTRAVENOUS
  Filled 2021-08-25: qty 1

## 2021-08-25 MED ORDER — IOHEXOL 300 MG/ML  SOLN
65.0000 mL | Freq: Once | INTRAMUSCULAR | Status: AC | PRN
  Administered 2021-08-25: 65 mL via INTRAVENOUS

## 2021-08-25 NOTE — Discharge Instructions (Signed)

## 2021-08-25 NOTE — ED Provider Notes (Signed)
[facility] DEPT Provider Note   CSN: [id number] Arrival date & time: 08/24/21  2355     History  Chief Complaint  Patient presents with   Neck Pain    Zoe Robinson is a 88 y.o. female.  The history is provided by the patient and a relative.  Neck Pain Pain location: anterior neck. Quality:  Aching Pain radiates to:  Does not radiate Pain severity:  Moderate Onset quality:  Sudden Duration:  6 hours Timing:  Constant Progression:  Unchanged Chronicity:  New Relieved by:  Nothing Worsened by:  Nothing Associated symptoms: no chest pain, no fever, no headaches, no numbness, no visual change and no weakness   Patient with history of rheumatoid arthritis, A. fib on anticoagulation, hypertension, tremor presents with anterior neck pain. Most of the history is provided by daughter.  It is reported the patient started having bilateral anterior neck pain approximately 6 hours ago.  No trauma.  No fevers or vomiting.  No difficulty breathing or swallowing.  No new arm or leg weakness.  No new headache or visual changes. No previous neck surgery. Daughter thought it might be cardiac related and she was given nitroglycerin without relief   Past Medical History:  Diagnosis Date   Arthritis    Atrial fibrillation (HCC)    Chronic atrial fibrillation (HCC)    CKD (chronic kidney disease)    Hypertension    Hypothyroidism    Incontinence    RA (rheumatoid arthritis) ([place])     Home Medications Prior to Admission medications   Medication Sig Start Date End Date Taking? Authorizing Provider  acetaminophen (TYLENOL) 500 MG tablet Take 1,000 mg by mouth as needed.     [provider]  Cranberry 500 MG TABS Take 1 tablet by mouth 2 (two) times daily.    [provider]  denosumab (PROLIA) 60 MG/ML SOLN injection Inject 60 mg into the skin every 6 (six) months. Administer in upper arm, thigh, or abdomen    [provider]   donepezil (ARICEPT) 10 MG tablet Take 10 mg by mouth at bedtime.    [provider]  feeding supplement, ENSURE COMPLETE, (ENSURE COMPLETE) LIQD Take 237 mLs by mouth 3 (three) times daily between meals. Patient taking differently: Take 237 mLs by mouth daily as needed (nutrition). 04/26/14   Geradine Girt, DO  ferrous sulfate 325 (65 FE) MG tablet Take 325 mg by mouth daily with breakfast.    [provider]  folic acid (FOLVITE) 1 MG tablet Take 1 tablet (1 mg total) by mouth daily. 03/18/21   Ofilia Neas, PA-C  irbesartan (AVAPRO) 300 MG tablet Take 300 mg by mouth daily. 05/05/18   [provider]  Lactobacillus TABS Take 1 tablet by mouth daily.    [provider]  Magnesium 250 MG TABS Take 1 tablet by mouth daily.    [provider]  methotrexate (RHEUMATREX) 2.5 MG tablet TAKE 2 TABLETS(5 MG) BY MOUTH 1 TIME A WEEK Patient not taking: No sig reported 07/21/20   Bo Merino, MD  methotrexate (RHEUMATREX) 2.5 MG tablet Take 2 tablets (5 mg total) by mouth once a week. Caution:Chemotherapy. Protect from light. Patient not taking: No sig reported 03/18/21   Ofilia Neas, PA-C  mirabegron ER (MYRBETRIQ) 50 MG TB24 tablet Take 50 mg by mouth daily.    [provider]  Multiple Vitamins-Minerals (MULTIVITAMIN PO) Take 1 tablet by mouth every morning.     [provider]  nitroGLYCERIN (NITROSTAT) 0.4 MG SL tablet Place 0.4 mg under the tongue every 5 (five) minutes as needed for chest pain.    [provider]  NP THYROID 15 MG tablet Take 15 mg by mouth daily. 04/03/21   [provider]  oxybutynin (DITROPAN-XL) 10 MG 24 hr tablet Take 10 mg by mouth every morning.     [provider]  polyethylene glycol (MIRALAX / GLYCOLAX) packet Take 17 g by mouth daily as needed. Patient not taking: Reported on 04/21/2021 04/26/14   Geradine Girt, DO  predniSONE (DELTASONE) 5 MG tablet TAKE 1 TABLET(5 MG) BY  MOUTH DAILY WITH BREAKFAST Patient taking differently: Take 5 mg by mouth daily with breakfast. 03/18/21   Ofilia Neas, PA-C  psyllium (METAMUCIL) 58.6 % packet Take 1 packet by mouth daily as needed (fiber).    [provider]  simvastatin (ZOCOR) 40 MG tablet Take 40 mg by mouth every evening.    [provider]  vitamin E 400 UNIT capsule Take 400 Units by mouth daily.    [provider]  XARELTO 20 MG TABS tablet Take 20 mg by mouth every evening. 10/25/19   [provider]      Allergies    Patient has no known allergies.    Review of Systems   Review of Systems  Constitutional:  Negative for fever.  HENT:  Negative for facial swelling and trouble swallowing.   Eyes:  Negative for visual disturbance.  Cardiovascular:  Negative for chest pain.  Musculoskeletal:  Positive for neck pain. Negative for back pain.  Neurological:  Negative for weakness, numbness and headaches.   Physical Exam Updated Vital Signs BP (!) 177/82    Pulse 98    Temp 98.4 F (36.9 C) (Oral)    Resp 18    Ht 1.499 m (4\' 11" )    Wt 59 kg    SpO2 99%    BMI 26.26 kg/m  Physical Exam CONSTITUTIONAL: Elderly, no acute distress HEAD: Normocephalic/atraumatic EYES: EOMI/PERRL ENMT: Mucous membranes moist, no angioedema, no stridor, no drooling, uvula midline without edema NECK: supple no meningeal signs, mild tenderness noted to the anterior neck.  No erythema or bruising.  No edema is noted.  No obvious bruits are noted SPINE/BACK:entire spine nontender CV: S1/S2 noted, no murmurs/rubs/gallops noted LUNGS: Lungs are clear to auscultation bilaterally, no apparent distress ABDOMEN: soft, nontender NEURO: Pt is awake/alert/appropriate, moves all extremitiesx4.  No facial droop.  No arm or leg drift.  Significant tremor is noted EXTREMITIES:full ROM SKIN: warm, color normal PSYCH: no abnormalities of mood noted, alert and oriented to situation  ED Results / Procedures /  Treatments   Labs (all labs ordered are listed, but only abnormal results are displayed) Labs Reviewed  BASIC METABOLIC PANEL - Abnormal; Notable for the following components:      Result Value   Glucose, Bld 111 (*)    BUN 39 (*)    Creatinine, Ser 1.07 (*)    GFR, Estimated 50 (*)    All other components within normal limits  CBC WITH DIFFERENTIAL/PLATELET - Abnormal; Notable for the following components:   WBC 11.4 (*)    Hemoglobin 11.6 (*)    HCT 34.8 (*)    Neutro Abs 8.0 (*)    Monocytes Absolute 1.1 (*)    All other components within normal limits    EKG EKG Interpretation  Date/Time:  Tuesday August 25 2021 01:16:11 EST Ventricular Rate:  85 PR Interval:  152 QRS Duration: 84 QT Interval:  356 QTC Calculation: 423 R Axis:   -18 Text Interpretation: rhythm indeterminate Nonspecific ST and T wave abnormality Abnormal ECG Interpretation limited secondary to artifact Confirmed by Ripley Fraise [phone]) on 08/25/2021 1:35:45 AM  Radiology CT Soft Tissue Neck W Contrast  Result Date: 08/25/2021 CLINICAL DATA:  Bilateral anterior neck pain, no complaints of swelling, difficulty swallowing, or breathing EXAM: CT NECK WITH CONTRAST TECHNIQUE: Multidetector CT imaging of the neck was performed using the standard protocol following the bolus administration of intravenous contrast. RADIATION DOSE REDUCTION: This exam was performed according to the departmental dose-optimization program which includes automated exposure control, adjustment of the mA and/or kV according to patient size and/or use of iterative reconstruction technique. CONTRAST:  51mL OMNIPAQUE IOHEXOL 300 MG/ML  SOLN COMPARISON:  None. FINDINGS: Pharynx and larynx: Normal. No mass or swelling. Salivary glands: No inflammation, mass, or stone. Thyroid: Hypoattenuating nodule in the right thyroid lobe, which measures up to 1.4 cm. Otherwise negative. Lymph nodes: None enlarged or abnormal density. Vascular: Aortic  atherosclerosis.  No acute vascular abnormality. Limited intracranial: Negative. Visualized orbits: No acute finding. Status post bilateral lens replacements. Mastoids and visualized paranasal sinuses: Chronic appearing opacification of the left sphenoid sinus. Otherwise negative. Skeleton: No acute osseous abnormality. Upper chest: Evaluation is somewhat limited by respiratory motion. No focal pulmonary opacity or pleural effusion. Other: No abnormality in the soft tissues of the anterior neck. IMPRESSION: 1. No acute finding in the neck. No etiology is seen for the patient's anterior neck pain. 2. 1.4 cm hypoenhancing nodule in the right thyroid lobe. Given the lesion's size, no follow-up is recommended. (Reference: J Am Coll Radiol. 2015 Feb;12(2): 143-50) Electronically Signed   By: Merilyn Baba M.D.   On: 08/25/2021 02:59    Procedures Procedures    Medications Ordered in ED Medications  acetaminophen (TYLENOL) tablet 650 mg (650 mg Oral Patient Refused/Not Given 08/25/21 0349)  fentaNYL (SUBLIMAZE) injection 25 mcg (25 mcg Intravenous Given 08/25/21 0116)  iohexol (OMNIPAQUE) 300 MG/ML solution 65 mL (65 mLs Intravenous Contrast Given 08/25/21 0214)    ED Course/ Medical Decision Making/ A&P Clinical Course as of 08/25/21 0522  Tue Aug 25, 2021  0354 CT imaging results were discussed with patient and family.  Daughter feels the patient has been worsening since she had the pain medicine.  She reports she does not seem herself that her neck is swollen [DW]  0354 Patient is awake and alert in no acute distress.  There is no angioedema.  No stridor or drooling.  There is no obvious worsening neck edema.  She can fully range her neck without difficulty.  She could be reacting to the fentanyl.  We will continue to monitor [DW]  [phone] Patient now feels improved and is requesting discharge home.  She is awake and alert in no acute distress.  I have never seen any worsening swelling in her neck.  She is  in no acute distress.  Daughter feels that she is back to baseline [DW]  0521 At this point it is reasonable for her to be discharged home.  We discussed strict ER return precautions.  She denies any new headache or weakness to suggest acute stroke. [DW]    Clinical Course User Index [DW] Ripley Fraise, MD                           Medical Decision Making  Amount and/or Complexity of Data Reviewed Labs: ordered. Radiology: ordered. ECG/medicine tests: ordered.  Risk OTC drugs. Prescription drug management.   This patient presents to the ED for concern of neck pain, this involves an extensive number of treatment options, and is a complaint that carries with it a high risk of complications and morbidity.  The differential diagnosis includes muscle strain, cervical spine injury, abscess, acute coronary syndrome  Comorbidities that complicate the patient evaluation: Patients presentation is complicated by their history of tremors  Social Determinants of Health: Patients elderly status increases the complexity of managing their presentation  Additional history obtained: Additional history obtained from family Records reviewed Primary Care Documents  Lab Tests: I Ordered, and personally interpreted labs.  The pertinent results include: Dehydration  Imaging Studies ordered: I ordered imaging studies including CT scan soft tissue neck I independently visualized and interpreted imaging which showed no acute findings I agree with the radiologist interpretation    Medicines ordered and prescription drug management: I ordered medication including fentanyl for pain Reevaluation of the patient after these medicines showed that the patient    stayed the same    Reevaluation: After the interventions noted above, I reevaluated the patient and found that they have :improved  Complexity of problems addressed: Patients presentation is most consistent with  acute complicated  illness/injury requiring diagnostic workup      Disposition: After consideration of the diagnostic results and the patients response to treatment,  I feel that the patent would benefit from discharge .          Final Clinical Impression(s) / ED Diagnoses Final diagnoses:  Neck pain    Rx / DC Orders ED Discharge Orders     None         Ripley Fraise, MD 08/25/21 [phone]

## 2021-08-25 NOTE — ED Triage Notes (Addendum)
Pt daughter at bedside stating the pt called c/o pain on both side of her neck for several hours. tender to touch and to move her head side to side in triage. Denies swelling, difficulty breathing or swallowing.

## 2021-08-27 NOTE — Progress Notes (Unsigned)
Office Visit Note  Patient: Zoe Robinson             Date of Birth: 04-Sep-1931           MRN: [id number]             PCP: Zoila Shutter, NP Referring: Zoila Shutter, NP Visit Date: 09/09/2021 Occupation: @GUAROCC @  Subjective:  No chief complaint on file.   History of Present Illness: Zoe Robinson is a 88 y.o. female ***   Activities of Daily Living:  Patient reports morning stiffness for *** {minute/hour:19697}.   Patient {ACTIONS;DENIES/REPORTS:21021675::"Denies"} nocturnal pain.  Difficulty dressing/grooming: {ACTIONS;DENIES/REPORTS:21021675::"Denies"} Difficulty climbing stairs: {ACTIONS;DENIES/REPORTS:21021675::"Denies"} Difficulty getting out of chair: {ACTIONS;DENIES/REPORTS:21021675::"Denies"} Difficulty using hands for taps, buttons, cutlery, and/or writing: {ACTIONS;DENIES/REPORTS:21021675::"Denies"}  No Rheumatology ROS completed.   PMFS History:  Patient Active Problem List   Diagnosis Date Noted   COVID-19 virus infection 06/30/2019   High risk medication use 03/25/2017   Rheumatoid arthritis involving multiple sites with positive rheumatoid factor ([place]) 03/15/2017   Rheumatoid arthritis involving both knees with positive rheumatoid factor ([place]) 03/15/2017   Rheumatoid arthritis involving both hands with positive rheumatoid factor ([place]) 03/15/2017   Rheumatoid arthritis involving both feet with positive rheumatoid factor ([place]) 03/15/2017   History of vertebral fracture 03/15/2017   On prednisone therapy 02/10/2017   Age-related osteoporosis without current pathological fracture 02/10/2017   History of pulmonary hypertension 02/10/2017   History of atrial fibrillation 02/10/2017   History of anemia 02/10/2017   History of recurrent UTIs 02/10/2017   History of hypercalcemia 02/10/2017   On anticoagulant therapy 02/10/2017   Acute encephalopathy 08/19/2014   Neutropenia ([place]) 04/20/2014   Fever, unspecified 04/20/2014   Unspecified  constipation 04/20/2014   Hypercalcemia 04/15/2014   Acute renal failure ([place]) 04/15/2014   Generalized weakness 04/15/2014   Dehydration 04/15/2014   UTI (urinary tract infection) 04/15/2014   Iron deficiency anemia 04/15/2014   CKD (chronic kidney disease)    Hypertension    Atrial fibrillation ([place])     Past Medical History:  Diagnosis Date   Arthritis    Atrial fibrillation (HCC)    Chronic atrial fibrillation (HCC)    CKD (chronic kidney disease)    Hypertension    Hypothyroidism    Incontinence    RA (rheumatoid arthritis) ([place])     Family History  Problem Relation Age of Onset   Heart attack Mother    Cancer Mother    Hypertension Mother    Heart disease Mother    Heart attack Father    Stomach cancer Father    Hypertension Father    Heart disease Father    Heart attack Brother    Heart attack Brother    Stomach cancer Brother    Cancer Sister    Dementia Sister    Osteoarthritis Daughter    Depression Daughter    Hypothyroidism Daughter    Hypercholesterolemia Daughter    Heart disease Son    Hypertension Son    Gout Son    Past Surgical History:  Procedure Laterality Date   ABDOMINAL HYSTERECTOMY  1990   Social History   Social History Narrative   Not on file   Immunization History  Administered Date(s) Administered   Influenza,inj,Quad PF,6+ Mos 04/19/2014   Moderna Sars-Covid-2 Vaccination 07/21/2019, 08/21/2019   Pneumococcal Conjugate-13 05/16/2015   Pneumococcal Polysaccharide-23 10/25/2010, 04/17/2014   Tdap 10/25/2010     Objective: Vital Signs: There were no vitals taken for this visit.   Physical  Exam   Musculoskeletal Exam: ***  CDAI Exam: CDAI Score: -- Patient Global: --; Provider Global: -- Swollen: --; Tender: -- Joint Exam 09/09/2021   No joint exam has been documented for this visit   There is currently no information documented on the homunculus. Go to the Rheumatology activity and complete the homunculus joint  exam.  Investigation: No additional findings.  Imaging: CT Soft Tissue Neck W Contrast  Result Date: 08/25/2021 CLINICAL DATA:  Bilateral anterior neck pain, no complaints of swelling, difficulty swallowing, or breathing EXAM: CT NECK WITH CONTRAST TECHNIQUE: Multidetector CT imaging of the neck was performed using the standard protocol following the bolus administration of intravenous contrast. RADIATION DOSE REDUCTION: This exam was performed according to the departmental dose-optimization program which includes automated exposure control, adjustment of the mA and/or kV according to patient size and/or use of iterative reconstruction technique. CONTRAST:  86mL OMNIPAQUE IOHEXOL 300 MG/ML  SOLN COMPARISON:  None. FINDINGS: Pharynx and larynx: Normal. No mass or swelling. Salivary glands: No inflammation, mass, or stone. Thyroid: Hypoattenuating nodule in the right thyroid lobe, which measures up to 1.4 cm. Otherwise negative. Lymph nodes: None enlarged or abnormal density. Vascular: Aortic atherosclerosis.  No acute vascular abnormality. Limited intracranial: Negative. Visualized orbits: No acute finding. Status post bilateral lens replacements. Mastoids and visualized paranasal sinuses: Chronic appearing opacification of the left sphenoid sinus. Otherwise negative. Skeleton: No acute osseous abnormality. Upper chest: Evaluation is somewhat limited by respiratory motion. No focal pulmonary opacity or pleural effusion. Other: No abnormality in the soft tissues of the anterior neck. IMPRESSION: 1. No acute finding in the neck. No etiology is seen for the patient's anterior neck pain. 2. 1.4 cm hypoenhancing nodule in the right thyroid lobe. Given the lesion's size, no follow-up is recommended. (Reference: J Am Coll Radiol. 2015 Feb;12(2): 143-50) Electronically Signed   By: Merilyn Baba M.D.   On: 08/25/2021 02:59    Recent Labs: Lab Results  Component Value Date   WBC 11.4 (H) 08/25/2021   HGB 11.6  (L) 08/25/2021   PLT 364 08/25/2021   NA 136 08/25/2021   K 4.3 08/25/2021   CL 101 08/25/2021   CO2 27 08/25/2021   GLUCOSE 111 (H) 08/25/2021   BUN 39 (H) 08/25/2021   CREATININE 1.07 (H) 08/25/2021   BILITOT 0.5 04/21/2021   ALKPHOS 42 04/21/2021   AST 27 04/21/2021   ALT 20 04/21/2021   PROT 7.4 04/21/2021   ALBUMIN 3.3 (L) 04/21/2021   CALCIUM 9.3 08/25/2021   GFRAA 55 (L) 10/16/2020    Speciality Comments: No specialty comments available.  Procedures:  No procedures performed Allergies: Patient has no known allergies.   Assessment / Plan:     Visit Diagnoses: No diagnosis found.  Orders: No orders of the defined types were placed in this encounter.  No orders of the defined types were placed in this encounter.   Face-to-face time spent with patient was *** minutes. Greater than 50% of time was spent in counseling and coordination of care.  Follow-Up Instructions: No follow-ups on file.   Earnestine Mealing, CMA  Note - This record has been created using Editor, commissioning.  Chart creation errors have been sought, but may not always  have been located. Such creation errors do not reflect on  the standard of medical care.

## 2021-09-03 NOTE — Progress Notes (Signed)
Office Visit Note  Patient: Zoe Robinson             Date of Birth: [birth year]           MRN: [id number]             PCP: Zoila Shutter, NP Referring: Zoila Shutter, NP Visit Date: 09/16/2021 Occupation: @GUAROCC @  Subjective:  Medication monitoring  History of Present Illness: Zoe Robinson is a 88 y.o. female with history of seropositive rheumatoid arthritis and osteoporosis.  The patient's daughter accompanied her to the visit today and acted as the primary historian.  She is taking prednisone 5 mg 1 tablet daily.  She continues to tolerate prednisone without any side effects.  She discontinued methotrexate in August 2022 due to elevated creatinine.  She has not noticed any increased joint pain or joint swelling since discontinuing methotrexate.  She denies any signs or symptoms of a flare recently.  She experiences some morning stiffness lasting about 15 minutes daily.  She has not had any recent falls or fractures.  Her energy level has been stable and she has been sleeping well at night.  She has a routine follow-up with her PCP on 09/23/2021.  She denies any new medical conditions since her last office visit. She continues to receive Prolia 60 mg sq injections every 6 months.   Activities of Daily Living:  Patient reports morning stiffness for 15 minutes.   Patient Denies nocturnal pain.  Difficulty dressing/grooming: Denies Difficulty climbing stairs: Reports Difficulty getting out of chair: Reports Difficulty using hands for taps, buttons, cutlery, and/or writing: Reports  Review of Systems  Constitutional:  Negative for fatigue.  HENT:  Negative for mouth dryness.   Eyes:  Negative for dryness.  Respiratory:  Negative for shortness of breath.   Cardiovascular:  Negative for swelling in legs/feet.  Gastrointestinal:  Negative for constipation.  Endocrine: Negative for excessive thirst.  Genitourinary:  Negative for difficulty urinating.  Musculoskeletal:   Positive for gait problem, muscle weakness and morning stiffness.  Skin:  Negative for rash.  Allergic/Immunologic: Negative for susceptible to infections.  Neurological:  Positive for weakness.  Hematological:  Positive for bruising/bleeding tendency.  Psychiatric/Behavioral:  Negative for sleep disturbance.    PMFS History:  Patient Active Problem List   Diagnosis Date Noted   COVID-19 virus infection 06/30/2019   High risk medication use 03/25/2017   Rheumatoid arthritis involving multiple sites with positive rheumatoid factor ([place]) 03/15/2017   Rheumatoid arthritis involving both knees with positive rheumatoid factor ([place]) 03/15/2017   Rheumatoid arthritis involving both hands with positive rheumatoid factor ([place]) 03/15/2017   Rheumatoid arthritis involving both feet with positive rheumatoid factor ([place]) 03/15/2017   History of vertebral fracture 03/15/2017   On prednisone therapy 02/10/2017   Age-related osteoporosis without current pathological fracture 02/10/2017   History of pulmonary hypertension 02/10/2017   History of atrial fibrillation 02/10/2017   History of anemia 02/10/2017   History of recurrent UTIs 02/10/2017   History of hypercalcemia 02/10/2017   On anticoagulant therapy 02/10/2017   Acute encephalopathy 08/19/2014   Neutropenia ([place]) 04/20/2014   Fever, unspecified 04/20/2014   Unspecified constipation 04/20/2014   Hypercalcemia 04/15/2014   Acute renal failure ([place]) 04/15/2014   Generalized weakness 04/15/2014   Dehydration 04/15/2014   UTI (urinary tract infection) 04/15/2014   Iron deficiency anemia 04/15/2014   CKD (chronic kidney disease)    Hypertension    Atrial fibrillation ([place])     Past Medical History:  Diagnosis Date   Arthritis    Atrial fibrillation (HCC)    Chronic atrial fibrillation (HCC)    CKD (chronic kidney disease)    Hypertension    Hypothyroidism    Incontinence    RA (rheumatoid arthritis) (HCC)     Family History   Problem Relation Age of Onset   Heart attack Mother    Cancer Mother    Hypertension Mother    Heart disease Mother    Heart attack Father    Stomach cancer Father    Hypertension Father    Heart disease Father    Heart attack Brother    Heart attack Brother    Stomach cancer Brother    Cancer Sister    Dementia Sister    Osteoarthritis Daughter    Depression Daughter    Hypothyroidism Daughter    Hypercholesterolemia Daughter    Heart disease Son    Hypertension Son    Gout Son    Past Surgical History:  Procedure Laterality Date   ABDOMINAL HYSTERECTOMY  1990   Social History   Social History Narrative   Not on file   Immunization History  Administered Date(s) Administered   Influenza,inj,Quad PF,6+ Mos 04/19/2014   Moderna Sars-Covid-2 Vaccination 07/21/2019, 08/21/2019   Pneumococcal Conjugate-13 05/16/2015   Pneumococcal Polysaccharide-23 10/25/2010, 04/17/2014   Tdap 10/25/2010     Objective: Vital Signs: BP 104/65 (BP Location: Left Arm, Patient Position: Sitting, Cuff Size: Normal)    Pulse 98    Resp 17    Ht 4\' 11"  (1.499 m)    Wt 124 lb (56.2 kg)    BMI 25.04 kg/m    Physical Exam Vitals and nursing note reviewed.  Constitutional:      Appearance: She is well-developed.  HENT:     Head: Normocephalic and atraumatic.  Eyes:     Conjunctiva/sclera: Conjunctivae normal.  Cardiovascular:     Rate and Rhythm: Normal rate and regular rhythm.     Heart sounds: Normal heart sounds.  Pulmonary:     Effort: Pulmonary effort is normal.     Breath sounds: Normal breath sounds.  Abdominal:     General: Bowel sounds are normal.     Palpations: Abdomen is soft.  Musculoskeletal:     Cervical back: Normal range of motion.  Lymphadenopathy:     Cervical: No cervical adenopathy.  Skin:    General: Skin is warm and dry.     Capillary Refill: Capillary refill takes less than 2 seconds.  Neurological:     Mental Status: She is alert and oriented to person,  place, and time.  Psychiatric:        Behavior: Behavior normal.     Musculoskeletal Exam: C-spine has good range of motion with no discomfort at this time.  Thoracic kyphosis was noted.  No midline spinal tenderness or SI joint tenderness.  Shoulder joints have good range of motion with no discomfort.  Elbow joints have good range of motion with no tenderness or inflammation.  Limited range of motion of both wrist joints.  Synovial thickening and ulnar deviation of both wrists and MCP joints.  Synovial thickening of MCP joints but no tenderness or synovitis noted.  PIP and DIP thickening noted.  Hip joints have good range of motion with no discomfort at this time.  Knee joints have good range of motion with no warmth or effusion.  Ankle joints have good range of motion with no tenderness or joint swelling.  CDAI Exam:  CDAI Score: 0.2  Patient Global: 1 mm; Provider Global: 1 mm Swollen: 0 ; Tender: 0  Joint Exam 09/16/2021   No joint exam has been documented for this visit   There is currently no information documented on the homunculus. Go to the Rheumatology activity and complete the homunculus joint exam.  Investigation: No additional findings.  Imaging: CT Soft Tissue Neck W Contrast  Result Date: 08/25/2021 CLINICAL DATA:  Bilateral anterior neck pain, no complaints of swelling, difficulty swallowing, or breathing EXAM: CT NECK WITH CONTRAST TECHNIQUE: Multidetector CT imaging of the neck was performed using the standard protocol following the bolus administration of intravenous contrast. RADIATION DOSE REDUCTION: This exam was performed according to the departmental dose-optimization program which includes automated exposure control, adjustment of the mA and/or kV according to patient size and/or use of iterative reconstruction technique. CONTRAST:  56mL OMNIPAQUE IOHEXOL 300 MG/ML  SOLN COMPARISON:  None. FINDINGS: Pharynx and larynx: Normal. No mass or swelling. Salivary glands: No  inflammation, mass, or stone. Thyroid: Hypoattenuating nodule in the right thyroid lobe, which measures up to 1.4 cm. Otherwise negative. Lymph nodes: None enlarged or abnormal density. Vascular: Aortic atherosclerosis.  No acute vascular abnormality. Limited intracranial: Negative. Visualized orbits: No acute finding. Status post bilateral lens replacements. Mastoids and visualized paranasal sinuses: Chronic appearing opacification of the left sphenoid sinus. Otherwise negative. Skeleton: No acute osseous abnormality. Upper chest: Evaluation is somewhat limited by respiratory motion. No focal pulmonary opacity or pleural effusion. Other: No abnormality in the soft tissues of the anterior neck. IMPRESSION: 1. No acute finding in the neck. No etiology is seen for the patient's anterior neck pain. 2. 1.4 cm hypoenhancing nodule in the right thyroid lobe. Given the lesion's size, no follow-up is recommended. (Reference: J Am Coll Radiol. 2015 Feb;12(2): 143-50) Electronically Signed   By: Merilyn Baba M.D.   On: 08/25/2021 02:59    Recent Labs: Lab Results  Component Value Date   WBC 11.4 (H) 08/25/2021   HGB 11.6 (L) 08/25/2021   PLT 364 08/25/2021   NA 136 08/25/2021   K 4.3 08/25/2021   CL 101 08/25/2021   CO2 27 08/25/2021   GLUCOSE 111 (H) 08/25/2021   BUN 39 (H) 08/25/2021   CREATININE 1.07 (H) 08/25/2021   BILITOT 0.5 04/21/2021   ALKPHOS 42 04/21/2021   AST 27 04/21/2021   ALT 20 04/21/2021   PROT 7.4 04/21/2021   ALBUMIN 3.3 (L) 04/21/2021   CALCIUM 9.3 08/25/2021   GFRAA 55 (L) 10/16/2020    Speciality Comments: No specialty comments available.  Procedures:  No procedures performed Allergies: Fentanyl   Assessment / Plan:     Visit Diagnoses: Rheumatoid arthritis involving multiple sites with positive rheumatoid factor ([place]): She has no synovitis or joint tenderness on examination.  She has not had any signs or symptoms of a rheumatoid arthritis flare recently.  She remains on  prednisone 5 mg 1 tablet daily and is tolerating it without any side effects.  She discontinued low-dose methotrexate 2 tablets weekly in August 2022 and has not noticed any new or worsening symptoms.  Her morning stiffness has been lasting about 15 minutes daily.  She has synovial thickening and ulnar deviation of both wrists and MCP joints but no tenderness or synovitis was noted.  She has not had any recent falls or fractures.  She will remain on prednisone 5 mg daily.  She was advised to notify us if she develops increased joint pain or joint swelling.  She will follow-up in the office in 6 months.  High risk medication use -Prednisone 5 mg 1 tablet by mouth daily.  Discontinued methotrexate in August 2022 due to elevated creatinine.  She does not require immunosuppressive therapy at this time. CBC and BMP drawn on 08/25/2021.  She has upcoming appointment with her PCP on 09/23/2021.  On prednisone therapy - She takes prednisone 5 mg 1 tablet by mouth daily.  She and her daughter are aware of the risks of long-term prednisone use.  Age-related osteoporosis without current pathological fracture -She receives Prolia 60 mg sq injections every 6 months managed by her PCP.  DEXA (found in care everywhere) was on 06/18/17: Right femur neck BMD 0.657 with T-score -2.7.  She has not had any recent falls or fractures.  History of vertebral fracture - Thoracic kyphosis noted.  No midline spinal tenderness noted.  Other medical conditions are listed as follows:  On anticoagulant therapy - She is taking Xarelto as prescribed.   History of atrial fibrillation  History of pulmonary hypertension  Orders: No orders of the defined types were placed in this encounter.  No orders of the defined types were placed in this encounter.     Follow-Up Instructions: Return in about 6 months (around 03/16/2022) for Rheumatoid arthritis, Osteoporosis.   Ofilia Neas, PA-C  Note - This record has been created  using Dragon software.  Chart creation errors have been sought, but may not always  have been located. Such creation errors do not reflect on  the standard of medical care.

## 2021-09-09 ENCOUNTER — Ambulatory Visit: Payer: Medicare Other | Admitting: Rheumatology

## 2021-09-09 DIAGNOSIS — Z7952 Long term (current) use of systemic steroids: Secondary | ICD-10-CM

## 2021-09-09 DIAGNOSIS — Z8679 Personal history of other diseases of the circulatory system: Secondary | ICD-10-CM

## 2021-09-09 DIAGNOSIS — Z79899 Other long term (current) drug therapy: Secondary | ICD-10-CM

## 2021-09-09 DIAGNOSIS — Z7901 Long term (current) use of anticoagulants: Secondary | ICD-10-CM

## 2021-09-09 DIAGNOSIS — M810 Age-related osteoporosis without current pathological fracture: Secondary | ICD-10-CM

## 2021-09-09 DIAGNOSIS — M0579 Rheumatoid arthritis with rheumatoid factor of multiple sites without organ or systems involvement: Secondary | ICD-10-CM

## 2021-09-09 DIAGNOSIS — Z8781 Personal history of (healed) traumatic fracture: Secondary | ICD-10-CM

## 2021-09-16 ENCOUNTER — Other Ambulatory Visit: Payer: Self-pay

## 2021-09-16 ENCOUNTER — Encounter: Payer: Self-pay | Admitting: Physician Assistant

## 2021-09-16 ENCOUNTER — Ambulatory Visit: Payer: Medicare Other | Admitting: Physician Assistant

## 2021-09-16 VITALS — BP 104/65 | HR 98 | Resp 17 | Ht 59.0 in | Wt 124.0 lb

## 2021-09-16 DIAGNOSIS — Z8781 Personal history of (healed) traumatic fracture: Secondary | ICD-10-CM

## 2021-09-16 DIAGNOSIS — Z7901 Long term (current) use of anticoagulants: Secondary | ICD-10-CM

## 2021-09-16 DIAGNOSIS — M810 Age-related osteoporosis without current pathological fracture: Secondary | ICD-10-CM | POA: Diagnosis not present

## 2021-09-16 DIAGNOSIS — M0579 Rheumatoid arthritis with rheumatoid factor of multiple sites without organ or systems involvement: Secondary | ICD-10-CM | POA: Diagnosis not present

## 2021-09-16 DIAGNOSIS — Z8679 Personal history of other diseases of the circulatory system: Secondary | ICD-10-CM

## 2021-09-16 DIAGNOSIS — Z7952 Long term (current) use of systemic steroids: Secondary | ICD-10-CM

## 2021-09-16 DIAGNOSIS — Z79899 Other long term (current) drug therapy: Secondary | ICD-10-CM | POA: Diagnosis not present

## 2022-03-09 NOTE — Progress Notes (Signed)
Office Visit Note  Patient: Zoe Robinson             Date of Birth: Sep 04, 1931           MRN: [id number]             PCP: Hortencia Conradi, NP Referring: Hortencia Conradi, NP Visit Date: 03/23/2022 Occupation: @GUAROCC @  Subjective:  Medication management  History of Present Illness: Zoe Robinson is a 88 y.o. female with history of severe end-stage rheumatoid arthritis and osteoporosis.  She was accompanied by her daughter today.  She has severe arthritis involving her hands.  Patient denies any discomfort.  She has been taking prednisone 5 mg p.o. daily which has been controlling her symptoms well.  She has been getting Prolia injections through her PCP.  Activities of Daily Living:  Patient reports morning stiffness for 10-15 minutes.   Patient Reports nocturnal pain.  Difficulty dressing/grooming: Denies Difficulty climbing stairs: Reports Difficulty getting out of chair: Reports Difficulty using hands for taps, buttons, cutlery, and/or writing: Reports  Review of Systems  Constitutional:  Positive for fatigue.  HENT:  Positive for mouth dryness. Negative for mouth sores.   Eyes:  Positive for dryness.  Respiratory:  Negative for shortness of breath.   Cardiovascular:  Negative for chest pain and palpitations.  Gastrointestinal:  Negative for blood in stool, constipation and diarrhea.  Endocrine: Negative for increased urination.  Genitourinary:  Negative for involuntary urination.  Musculoskeletal:  Positive for joint pain, gait problem, joint pain, joint swelling, myalgias, muscle weakness, morning stiffness, muscle tenderness and myalgias.  Skin:  Positive for sensitivity to sunlight. Negative for color change, rash and hair loss.  Allergic/Immunologic: Positive for susceptible to infections.  Neurological:  Negative for dizziness and headaches.  Hematological:  Negative for swollen glands.  Psychiatric/Behavioral:  Negative for depressed mood and sleep  disturbance. The patient is not nervous/anxious.     PMFS History:  Patient Active Problem List   Diagnosis Date Noted   COVID-19 virus infection 06/30/2019   High risk medication use 03/25/2017   Rheumatoid arthritis involving multiple sites with positive rheumatoid factor (HCC) 03/15/2017   Rheumatoid arthritis involving both knees with positive rheumatoid factor (HCC) 03/15/2017   Rheumatoid arthritis involving both hands with positive rheumatoid factor (HCC) 03/15/2017   Rheumatoid arthritis involving both feet with positive rheumatoid factor (HCC) 03/15/2017   History of vertebral fracture 03/15/2017   On prednisone therapy 02/10/2017   Age-related osteoporosis without current pathological fracture 02/10/2017   History of pulmonary hypertension 02/10/2017   History of atrial fibrillation 02/10/2017   History of anemia 02/10/2017   History of recurrent UTIs 02/10/2017   History of hypercalcemia 02/10/2017   On anticoagulant therapy 02/10/2017   Acute encephalopathy 08/19/2014   Neutropenia (HCC) 04/20/2014   Fever, unspecified 04/20/2014   Unspecified constipation 04/20/2014   Hypercalcemia 04/15/2014   Acute renal failure (HCC) 04/15/2014   Generalized weakness 04/15/2014   Dehydration 04/15/2014   UTI (urinary tract infection) 04/15/2014   Iron deficiency anemia 04/15/2014   CKD (chronic kidney disease)    Hypertension    Atrial fibrillation (HCC)     Past Medical History:  Diagnosis Date   Arthritis    Atrial fibrillation (HCC)    Chronic atrial fibrillation (HCC)    CKD (chronic kidney disease)    Hypertension    Hypothyroidism    Incontinence    RA (rheumatoid arthritis) (HCC)     Family History  Problem Relation Age of  Onset   Heart attack Mother    Cancer Mother    Hypertension Mother    Heart disease Mother    Heart attack Father    Stomach cancer Father    Hypertension Father    Heart disease Father    Heart attack Brother    Heart attack Brother     Stomach cancer Brother    Cancer Sister    Dementia Sister    Osteoarthritis Daughter    Depression Daughter    Hypothyroidism Daughter    Hypercholesterolemia Daughter    Heart disease Son    Hypertension Son    Gout Son    Past Surgical History:  Procedure Laterality Date   ABDOMINAL HYSTERECTOMY  1990   Social History   Social History Narrative   Not on file   Immunization History  Administered Date(s) Administered   Influenza,inj,Quad PF,6+ Mos 04/19/2014   Moderna Sars-Covid-2 Vaccination 07/21/2019, 08/21/2019   Pneumococcal Conjugate-13 05/16/2015   Pneumococcal Polysaccharide-23 10/25/2010, 04/17/2014   Tdap 10/25/2010     Objective: Vital Signs: BP 121/71 (BP Location: Left Arm, Patient Position: Sitting, Cuff Size: Normal)   Pulse 77   Resp 13   Ht 4\' 11"  (1.499 m)   Wt 113 lb (51.3 kg)   BMI 22.82 kg/m    Physical Exam Vitals and nursing note reviewed.  Constitutional:      Appearance: She is well-developed.  HENT:     Head: Normocephalic and atraumatic.  Eyes:     Conjunctiva/sclera: Conjunctivae normal.  Cardiovascular:     Rate and Rhythm: Normal rate and regular rhythm.     Heart sounds: Normal heart sounds.  Pulmonary:     Effort: Pulmonary effort is normal.     Breath sounds: Normal breath sounds.  Abdominal:     General: Bowel sounds are normal.     Palpations: Abdomen is soft.  Musculoskeletal:     Cervical back: Normal range of motion.  Lymphadenopathy:     Cervical: No cervical adenopathy.  Skin:    General: Skin is warm and dry.     Capillary Refill: Capillary refill takes less than 2 seconds.  Neurological:     Mental Status: She is alert and oriented to person, place, and time.  Psychiatric:        Behavior: Behavior normal.      Musculoskeletal Exam: She had good range of motion of the cervical spine without any discomfort.  She had thoracic kyphosis.  Lumbar spine was difficult to assess in the sitting position.   Shoulder joints were in good range of motion.  Elbow joints with good range of motion without any warmth swelling or effusion.  She had limited range of motion of her wrist joints.  She had bilateral severe ulnar deviation and contractures of the MCPs PIPs and DIPs with incomplete fist formation and incomplete extension.  Hip joints were difficult to assess in the sitting position.  Knee joints in good range of motion.  She had no tenderness over ankles or MTPs.  CDAI Exam: CDAI Score: 0.6  Patient Global: 2 mm; Provider Global: 4 mm Swollen: 0 ; Tender: 0  Joint Exam 03/23/2022   No joint exam has been documented for this visit   There is currently no information documented on the homunculus. Go to the Rheumatology activity and complete the homunculus joint exam.  Investigation: No additional findings.  Imaging: No results found.  Recent Labs: Lab Results  Component Value Date   WBC  11.4 (H) 08/25/2021   HGB 11.6 (L) 08/25/2021   PLT 364 08/25/2021   NA 136 08/25/2021   K 4.3 08/25/2021   CL 101 08/25/2021   CO2 27 08/25/2021   GLUCOSE 111 (H) 08/25/2021   BUN 39 (H) 08/25/2021   CREATININE 1.07 (H) 08/25/2021   BILITOT 0.5 04/21/2021   ALKPHOS 42 04/21/2021   AST 27 04/21/2021   ALT 20 04/21/2021   PROT 7.4 04/21/2021   ALBUMIN 3.3 (L) 04/21/2021   CALCIUM 9.3 08/25/2021   GFRAA 55 (L) 10/16/2020    Speciality Comments: No specialty comments available.  Procedures:  No procedures performed Allergies: Fentanyl   Assessment / Plan:     Visit Diagnoses: Rheumatoid arthritis involving multiple sites with positive rheumatoid factor (HCC)-patient states that she is doing well on prednisone 5 mg p.o. daily.  No synovitis was noted.  Methotrexate was discontinued more than a year ago.  She had no synovitis on examination.  Although she has multiple contractures and end-stage rheumatoid arthritis.  Her daughter mentioned that it is difficult for her to come for the office  visits.  I advised that if her PCP takes over the prednisone prescription then she can follow-up with the PCP and would not have to come here for the visits.  High risk medication use - Prednisone 5 mg 1 tablet by mouth daily.  Discontinued methotrexate in August 2022 due to elevated creatinine.  Patient states that she will forward recent labs to our office.  On prednisone therapy - prednisone 5 mg 1 tablet by mouth daily.   Age-related osteoporosis without current pathological fracture - Prolia 60 mg sq injections every 6 months managed by her PCP.  DEXA (found in care everywhere) was on 06/18/17: Right femur neck BMD 0.657 with T-score -2.7.   History of vertebral fracture - Thoracic kyphosis noted.    On anticoagulant therapy  History of pulmonary hypertension  History of atrial fibrillation  Orders: No orders of the defined types were placed in this encounter.  No orders of the defined types were placed in this encounter.  .  Follow-Up Instructions: Return in about 6 months (around 09/23/2022) for Rheumatoid arthritis.   Pollyann Savoy, MD  Note - This record has been created using Animal nutritionist.  Chart creation errors have been sought, but may not always  have been located. Such creation errors do not reflect on  the standard of medical care.

## 2022-03-23 ENCOUNTER — Encounter: Payer: Self-pay | Admitting: Rheumatology

## 2022-03-23 ENCOUNTER — Ambulatory Visit: Payer: Medicare Other | Attending: Rheumatology | Admitting: Rheumatology

## 2022-03-23 VITALS — BP 121/71 | HR 77 | Resp 13 | Ht 59.0 in | Wt 113.0 lb

## 2022-03-23 DIAGNOSIS — Z8781 Personal history of (healed) traumatic fracture: Secondary | ICD-10-CM

## 2022-03-23 DIAGNOSIS — Z8679 Personal history of other diseases of the circulatory system: Secondary | ICD-10-CM

## 2022-03-23 DIAGNOSIS — M0579 Rheumatoid arthritis with rheumatoid factor of multiple sites without organ or systems involvement: Secondary | ICD-10-CM

## 2022-03-23 DIAGNOSIS — Z7901 Long term (current) use of anticoagulants: Secondary | ICD-10-CM

## 2022-03-23 DIAGNOSIS — Z79899 Other long term (current) drug therapy: Secondary | ICD-10-CM | POA: Diagnosis not present

## 2022-03-23 DIAGNOSIS — M810 Age-related osteoporosis without current pathological fracture: Secondary | ICD-10-CM | POA: Diagnosis not present

## 2022-03-23 DIAGNOSIS — Z7952 Long term (current) use of systemic steroids: Secondary | ICD-10-CM

## 2022-07-06 IMAGING — CT CT NECK W/ CM
3 of 4 series · 14 of 33 positions shown, 17 images · IV contrast (agent unspecified)
Comparison: None.

CLINICAL DATA: Bilateral anterior neck pain, no complaints of
swelling, difficulty swallowing, or breathing

EXAM:
CT NECK WITH CONTRAST
TECHNIQUE: Multidetector CT imaging of the neck was performed using the
standard protocol following the bolus administration of intravenous
contrast.

[Series 4: axial · axial · 0.39mm/px · z∈[+1346,+1504]mm · 6 of 116 slices shown, 8 images]
[im 17/116  soft-tissue]
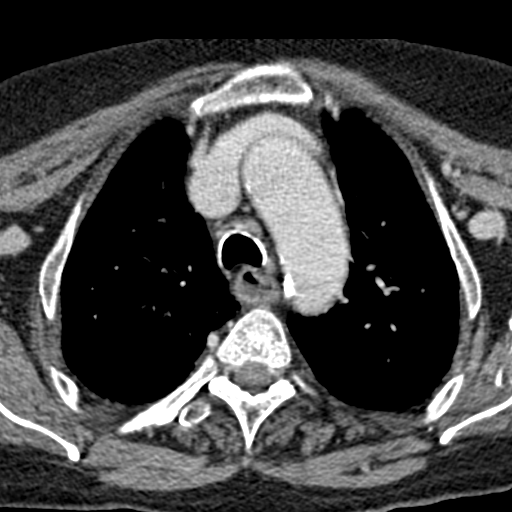
[im 17/116  bone]
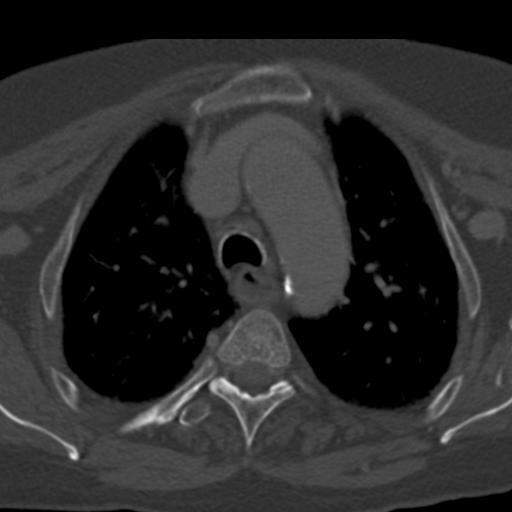
[im 33/116  bone]
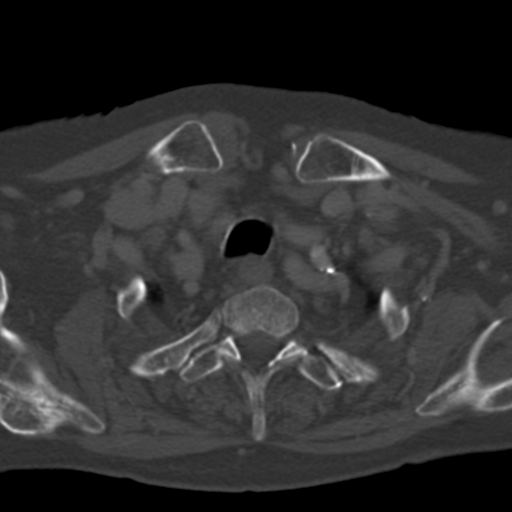
[im 50/116  bone]
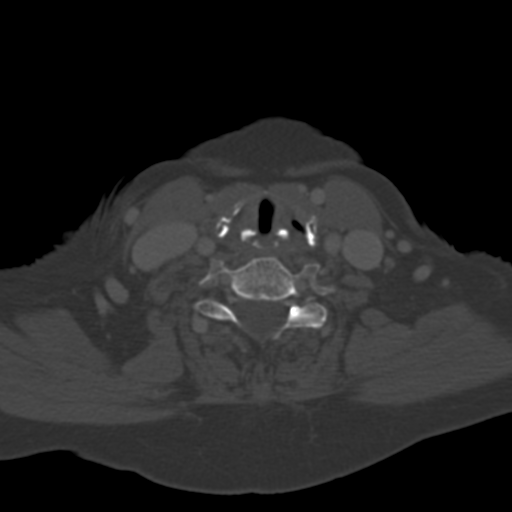
[im 66/116  bone]
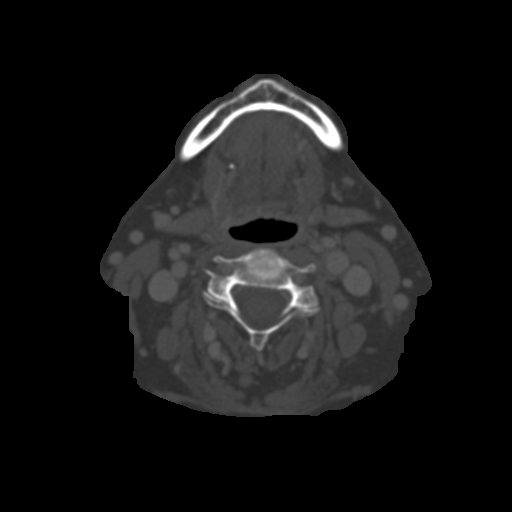
[im 83/116  soft-tissue]
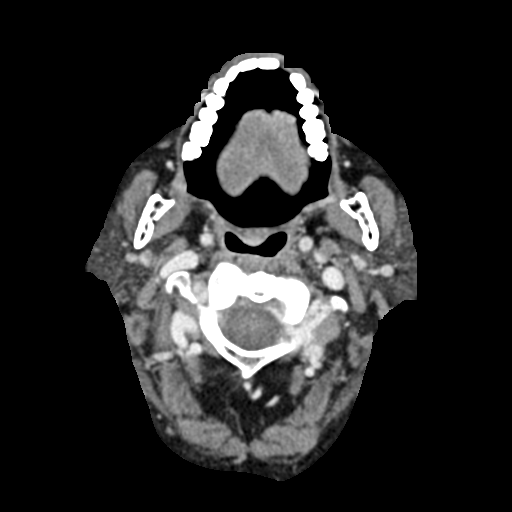
[im 83/116  bone]
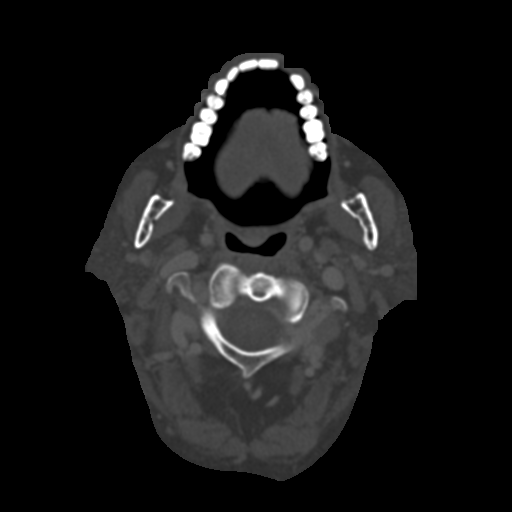
[im 99/116  bone]
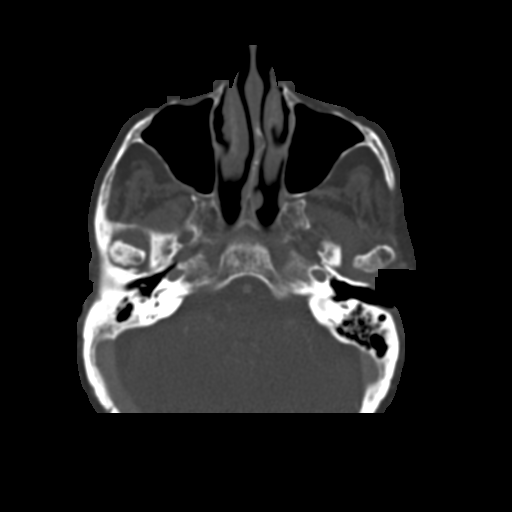

[Series 5: coronal · coronal · 0.42mm/px · 3 of 101 slices shown]
[im 22/101  bone]
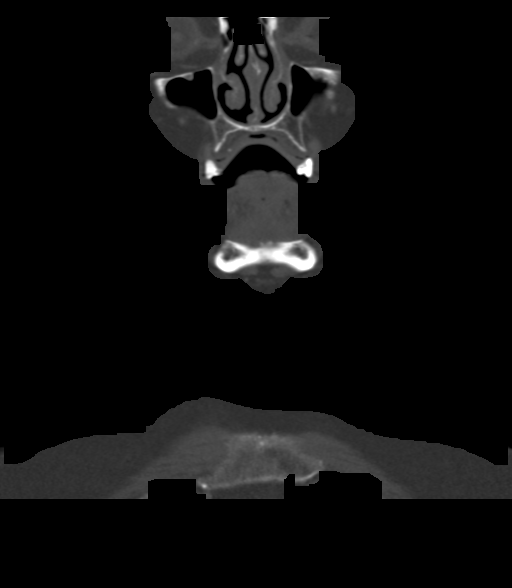
[im 41/101  bone]
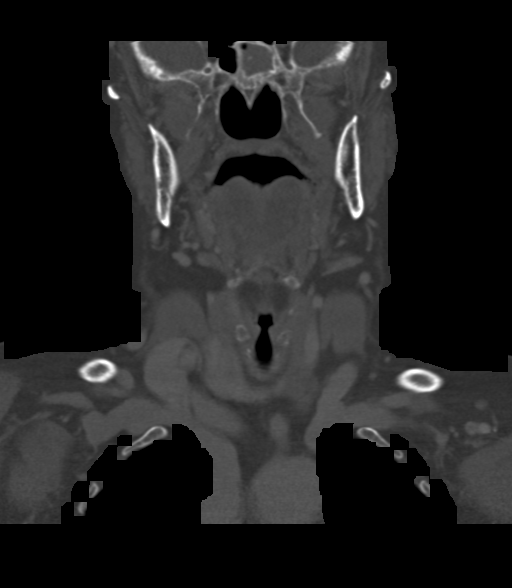
[im 60/101  bone]
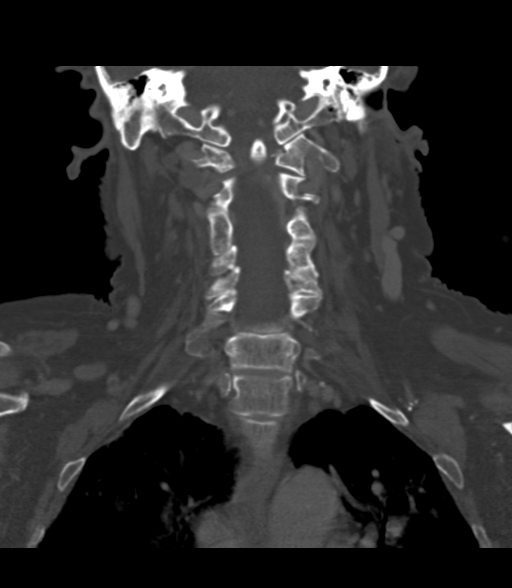

[Series 6: sagittal · sagittal · 0.42mm/px · 5 of 101 slices shown, 6 images]
[im 34/101  bone]
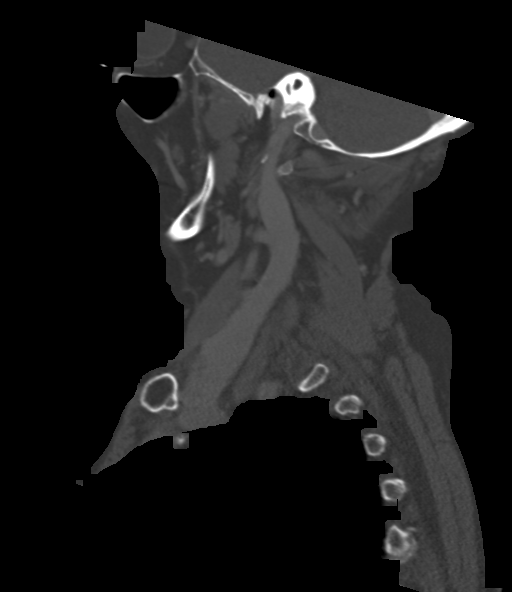
[im 42/101  bone]
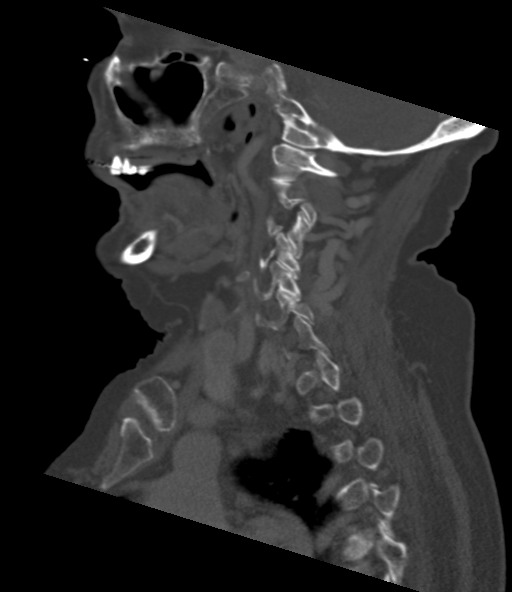
[im 51/101  soft-tissue]
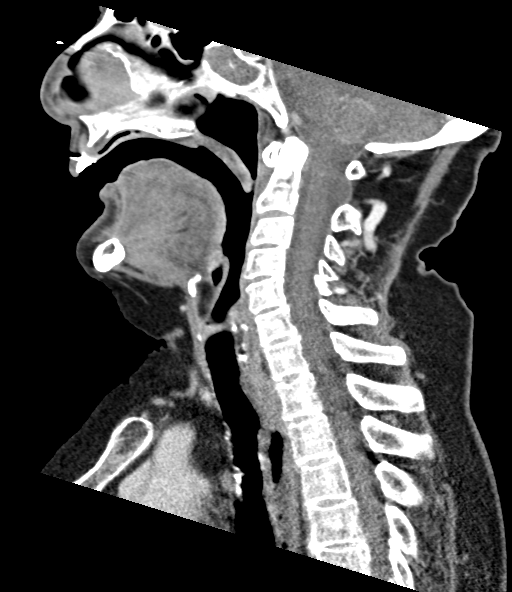
[im 51/101  bone]
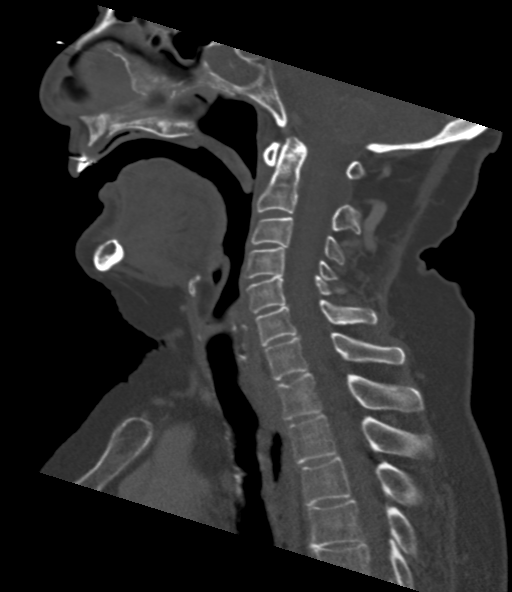
[im 59/101  bone]
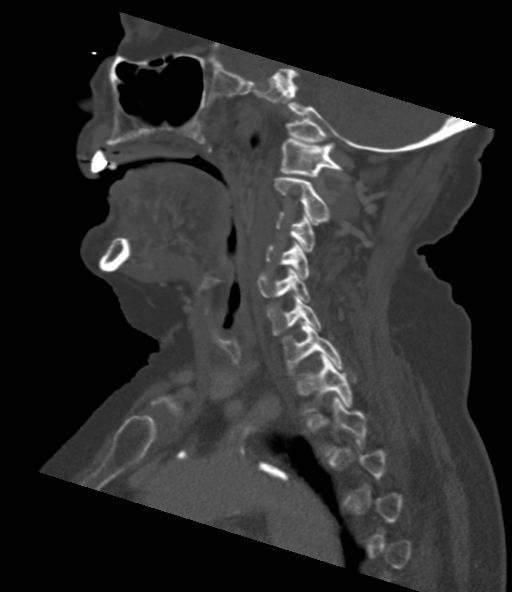
[im 67/101  bone]
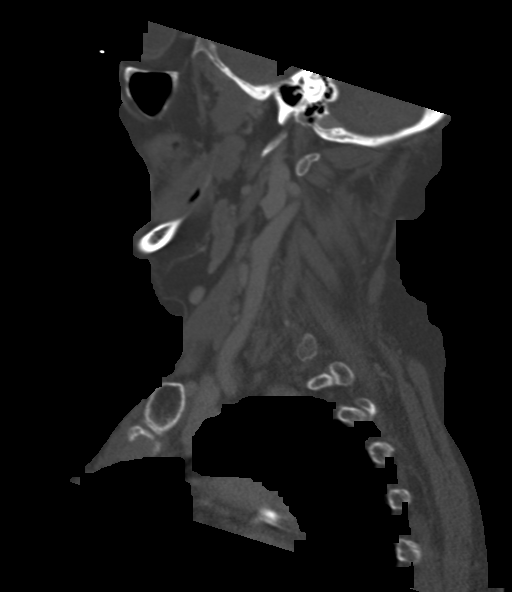

[14 of 33 positions shown; findings below may reference images not displayed]

RADIATION DOSE REDUCTION: This exam was performed according to the
departmental dose-optimization program which includes automated
exposure control, adjustment of the mA and/or kV according to
patient size and/or use of iterative reconstruction technique.

CONTRAST:  65mL OMNIPAQUE IOHEXOL 300 MG/ML  SOLN
FINDINGS: Pharynx and larynx: Normal. No mass or swelling.

Salivary glands: No inflammation, mass, or stone.

Thyroid: Hypoattenuating nodule in the right thyroid lobe, which
measures up to 1.4 cm. Otherwise negative.

Lymph nodes: None enlarged or abnormal density.

Vascular: Aortic atherosclerosis.  No acute vascular abnormality.

Limited intracranial: Negative.

Visualized orbits: No acute finding. Status post bilateral lens
replacements.

Mastoids and visualized paranasal sinuses: Chronic appearing
opacification of the left sphenoid sinus. Otherwise negative.

Skeleton: No acute osseous abnormality.

Upper chest: Evaluation is somewhat limited by respiratory motion.
No focal pulmonary opacity or pleural effusion.

Other: No abnormality in the soft tissues of the anterior neck.
IMPRESSION: 1. No acute finding in the neck. No etiology is seen for the
patient's anterior neck pain.

2. 1.4 cm hypoenhancing nodule in the right thyroid lobe. Given the
lesion's size, no follow-up is recommended. (Reference: [HOSPITAL]. [DATE]): 143-50)

## 2022-08-20 ENCOUNTER — Encounter (HOSPITAL_COMMUNITY): Payer: Self-pay

## 2022-08-20 ENCOUNTER — Emergency Department (HOSPITAL_COMMUNITY): Payer: Medicare Other

## 2022-08-20 ENCOUNTER — Emergency Department (HOSPITAL_COMMUNITY)
Admission: EM | Admit: 2022-08-20 | Discharge: 2022-08-20 | Disposition: A | Discharge status: Home / Self Care | Payer: Medicare Other | Attending: Emergency Medicine | Admitting: Emergency Medicine

## 2022-08-20 ENCOUNTER — Other Ambulatory Visit: Payer: Self-pay

## 2022-08-20 DIAGNOSIS — R531 Weakness: Secondary | ICD-10-CM | POA: Insufficient documentation

## 2022-08-20 DIAGNOSIS — F0390 Unspecified dementia without behavioral disturbance: Secondary | ICD-10-CM | POA: Diagnosis not present

## 2022-08-20 DIAGNOSIS — Z79899 Other long term (current) drug therapy: Secondary | ICD-10-CM | POA: Diagnosis not present

## 2022-08-20 DIAGNOSIS — E039 Hypothyroidism, unspecified: Secondary | ICD-10-CM | POA: Diagnosis not present

## 2022-08-20 DIAGNOSIS — S93105A Unspecified dislocation of left toe(s), initial encounter: Secondary | ICD-10-CM

## 2022-08-20 DIAGNOSIS — Z7901 Long term (current) use of anticoagulants: Secondary | ICD-10-CM | POA: Diagnosis not present

## 2022-08-20 DIAGNOSIS — S99922A Unspecified injury of left foot, initial encounter: Secondary | ICD-10-CM | POA: Diagnosis present

## 2022-08-20 DIAGNOSIS — W228XXA Striking against or struck by other objects, initial encounter: Secondary | ICD-10-CM | POA: Diagnosis not present

## 2022-08-20 DIAGNOSIS — E1122 Type 2 diabetes mellitus with diabetic chronic kidney disease: Secondary | ICD-10-CM | POA: Diagnosis not present

## 2022-08-20 DIAGNOSIS — S93125A Dislocation of metatarsophalangeal joint of left lesser toe(s), initial encounter: Secondary | ICD-10-CM | POA: Diagnosis not present

## 2022-08-20 DIAGNOSIS — N189 Chronic kidney disease, unspecified: Secondary | ICD-10-CM | POA: Diagnosis not present

## 2022-08-20 DIAGNOSIS — I129 Hypertensive chronic kidney disease with stage 1 through stage 4 chronic kidney disease, or unspecified chronic kidney disease: Secondary | ICD-10-CM | POA: Diagnosis not present

## 2022-08-20 LAB — COMPREHENSIVE METABOLIC PANEL
ALT: 47 U/L — ABNORMAL HIGH (ref 0–44)
AST: 49 U/L — ABNORMAL HIGH (ref 15–41)
Albumin: 2.6 g/dL — ABNORMAL LOW (ref 3.5–5.0)
Alkaline Phosphatase: 46 U/L (ref 38–126)
Anion gap: 11 (ref 5–15)
BUN: 31 mg/dL — ABNORMAL HIGH (ref 8–23)
CO2: 26 mmol/L (ref 22–32)
Calcium: 8.9 mg/dL (ref 8.9–10.3)
Chloride: 99 mmol/L (ref 98–111)
Creatinine, Ser: 0.98 mg/dL (ref 0.44–1.00)
GFR, Estimated: 56 mL/min — ABNORMAL LOW (ref >60)
Glucose, Bld: 176 mg/dL — ABNORMAL HIGH (ref 70–99)
Potassium: 4.3 mmol/L (ref 3.5–5.1)
Sodium: 136 mmol/L (ref 135–145)
Total Bilirubin: 0.7 mg/dL (ref 0.3–1.2)
Total Protein: 6.9 g/dL (ref 6.5–8.1)

## 2022-08-20 LAB — URINALYSIS, ROUTINE W REFLEX MICROSCOPIC
Bilirubin Urine: NEGATIVE
Glucose, UA: NEGATIVE mg/dL
Hgb urine dipstick: NEGATIVE
Ketones, ur: 20 mg/dL — AB
Leukocytes,Ua: NEGATIVE
Nitrite: NEGATIVE
Protein, ur: NEGATIVE mg/dL
Specific Gravity, Urine: 1.018 (ref 1.005–1.030)
pH: 6 (ref 5.0–8.0)

## 2022-08-20 LAB — CBC WITH DIFFERENTIAL/PLATELET
Abs Immature Granulocytes: 0.05 10*3/uL (ref 0.00–0.07)
Basophils Absolute: 0 10*3/uL (ref 0.0–0.1)
Basophils Relative: 0 %
Eosinophils Absolute: 0 10*3/uL (ref 0.0–0.5)
Eosinophils Relative: 0 %
HCT: 31.4 % — ABNORMAL LOW (ref 36.0–46.0)
Hemoglobin: 10.3 g/dL — ABNORMAL LOW (ref 12.0–15.0)
Immature Granulocytes: 1 %
Lymphocytes Relative: 15 %
Lymphs Abs: 1.1 10*3/uL (ref 0.7–4.0)
MCH: 27 pg (ref 26.0–34.0)
MCHC: 32.8 g/dL (ref 30.0–36.0)
MCV: 82.2 fL (ref 80.0–100.0)
Monocytes Absolute: 0.3 10*3/uL (ref 0.1–1.0)
Monocytes Relative: 5 %
Neutro Abs: 5.7 10*3/uL (ref 1.7–7.7)
Neutrophils Relative %: 79 %
Platelets: 458 10*3/uL — ABNORMAL HIGH (ref 150–400)
RBC: 3.82 MIL/uL — ABNORMAL LOW (ref 3.87–5.11)
RDW: 14.5 % (ref 11.5–15.5)
WBC: 7.3 10*3/uL (ref 4.0–10.5)
nRBC: 0 % (ref 0.0–0.2)

## 2022-08-20 LAB — PROTIME-INR
INR: 3.3 — ABNORMAL HIGH (ref 0.8–1.2)
Prothrombin Time: 33.1 seconds — ABNORMAL HIGH (ref 11.4–15.2)

## 2022-08-20 LAB — CBG MONITORING, ED: Glucose-Capillary: 188 mg/dL — ABNORMAL HIGH (ref 70–99)

## 2022-08-20 LAB — LACTIC ACID, PLASMA: Lactic Acid, Venous: 1.7 mmol/L (ref 0.5–1.9)

## 2022-08-20 MED ORDER — ACETAMINOPHEN 500 MG PO TABS
1000.0000 mg | ORAL_TABLET | Freq: Once | ORAL | Status: AC
  Administered 2022-08-20: 1000 mg via ORAL
  Filled 2022-08-20: qty 2

## 2022-08-20 NOTE — ED Notes (Signed)
Asked patient for a UA sample, unable at this time. [place] is in place.

## 2022-08-20 NOTE — Discharge Instructions (Signed)
Thank you for coming to [facility] Emergency Department. You were seen for fatigue and generalized weakness. We did an exam, labs, and imaging, and these showed  dislocated 3rd left toe. This has likely been dislocated for at least 6 days. We were unsuccessful in two attempts to relocate the toe. We have given information for Dr. Amalia Hailey in [place] [facility]. Please give them a call on Monday morning to make an appointment to follow-up this week.  Please stay well-hydrated at home.  Please follow up with your primary care provider within 1 week for reevaluation and lab recheck.  Do not hesitate to return to the ED or call 911 if you experience: -Worsening symptoms -Redness, swelling, numbness of the left foot -Lightheadedness, passing out -Fevers/chills -Anything else that concerns you

## 2022-08-20 NOTE — ED Provider Notes (Addendum)
[place] Provider Note   CSN: [id number] Arrival date & time: 08/20/22  1550     History  Chief Complaint  Patient presents with   Weakness   Urinary Tract Infection    Zoe Robinson is a 89 y.o. female with dementia, T2DM, CKD, A-fib, IDA, pulmonary hypertension, rheumatoid arthritis, hypercalcemia who presents with generalized weakness and concern for UTI.   Patient has history of dementia and is a poor historian at baseline. Daughter provides history.   EMS states that the pt has been fighting a UTI for the last 3 weeks. She has been seen by urology and placed on 3 different antibiotics, she is currently still feeling weak.  Daughter notes a general decline in patient's status for >2 months. Started having R flank pain and last Tuesday patient went to [place] hospital by EMS for c/f UTI for R side pain and fever on presentation. Stayed there for 2 or 3 days. Was discharged on Friday last week and hasn't really improved. DC'd on antibiotic but NP came and changed it yesterday. Unsure which antibiotic it is. Had previously been able to walk with a walker to the bathroom but then this morning she couldn't get out of bed d/t weakness. Has had decreased PO intake, moaning and c/o gen weakness. Nausea and dry heaving, cold sweats. Also w/ watery diarrhea that improved after change of antibiotics. Patient lives with her husband and has NP that comes in.  Daughter states that they did stumble a little bit when transferring her to wheelchair the other day and her left foot has been swollen since then and she has complained of pain in the foot.    Weakness Urinary Tract Infection      Home Medications Prior to Admission medications   Medication Sig Start Date End Date Taking? Authorizing Provider  acetaminophen (TYLENOL) 500 MG tablet Take 1,000 mg by mouth as needed.     [provider]  Cranberry 500 MG TABS Take 1 tablet by  mouth 2 (two) times daily.    [provider]  denosumab (PROLIA) 60 MG/ML SOLN injection Inject 60 mg into the skin every 6 (six) months. Administer in upper arm, thigh, or abdomen    [provider]  donepezil (ARICEPT) 10 MG tablet Take 10 mg by mouth at bedtime.    [provider]  feeding supplement, ENSURE COMPLETE, (ENSURE COMPLETE) LIQD Take 237 mLs by mouth 3 (three) times daily between meals. Patient taking differently: Take 237 mLs by mouth daily as needed (nutrition). 04/26/14   Geradine Girt, DO  ferrous sulfate 325 (65 FE) MG tablet Take 325 mg by mouth daily with breakfast.    [provider]  folic acid (FOLVITE) 1 MG tablet Take 1 tablet (1 mg total) by mouth daily. 03/18/21   Ofilia Neas, PA-C  irbesartan (AVAPRO) 300 MG tablet Take 300 mg by mouth daily. 05/05/18   [provider]  Lactobacillus TABS Take 1 tablet by mouth daily.    [provider]  Magnesium 250 MG TABS Take 1 tablet by mouth daily.    [provider]  mirabegron ER (MYRBETRIQ) 50 MG TB24 tablet Take 50 mg by mouth daily.    [provider]  Multiple Vitamins-Minerals (MULTIVITAMIN PO) Take 1 tablet by mouth every morning.     [provider]  nitroGLYCERIN (NITROSTAT) 0.4 MG SL tablet Place 0.4 mg under the tongue every 5 (five) minutes as needed  for chest pain.    [provider]  NP THYROID 15 MG tablet Take 15 mg by mouth daily. Patient not taking: Reported on 09/16/2021 04/03/21   [provider]  oxybutynin (DITROPAN-XL) 10 MG 24 hr tablet Take 10 mg by mouth every morning.     [provider]  polyethylene glycol (MIRALAX / GLYCOLAX) packet Take 17 g by mouth daily as needed. 04/26/14   Geradine Girt, DO  predniSONE (DELTASONE) 5 MG tablet TAKE 1 TABLET(5 MG) BY MOUTH DAILY WITH BREAKFAST Patient taking differently: Take 5 mg by mouth daily with breakfast. 03/18/21   Ofilia Neas, PA-C  psyllium  (METAMUCIL) 58.6 % packet Take 1 packet by mouth daily as needed (fiber).    [provider]  simvastatin (ZOCOR) 40 MG tablet Take 40 mg by mouth every evening.    [provider]  vitamin E 400 UNIT capsule Take 400 Units by mouth daily.    [provider]  XARELTO 20 MG TABS tablet Take 20 mg by mouth every evening. 10/25/19   [provider]      Allergies    Fentanyl    Review of Systems   Review of Systems  Neurological:  Positive for weakness.   Review of systems provided by daughter and is Negative for f/c.  A 10 point review of systems was performed and is negative unless otherwise reported in HPI.  Physical Exam Updated Vital Signs BP (!) 131/102   Pulse 73   Temp (!) 96.6 F (35.9 C) (Rectal)   Resp 20   Ht 4' 11"$  (1.499 m)   Wt 51.3 kg   SpO2 96%   BMI 22.84 kg/m  Physical Exam General: Elderly chronically ill-appearing female, lying in bed. Cachectic.  HEENT: PERRLA, Sclera anicteric, MMM, trachea midline.  Cardiology: RRR, no murmurs/rubs/gallops. BL radial and DP pulses equal bilaterally.  Resp: Normal respiratory rate and effort. CTAB, no wheezes, rhonchi, crackles.  Abd: Soft, non-tender, non-distended. No rebound tenderness or guarding.  GU: Deferred. MSK: TTP to left midfoot with arthritic toes, left 3-5th toes flexed and crossed over one another. Intact DP/PT pulses bilaterally.  No peripheral edema or signs of trauma. Extremities without deformity or TTP. No cyanosis or clubbing. Skin: warm, dry. No rashes or lesions. Back: No CVA tenderness Neuro: A&Ox4, CNs II-XII grossly intact. MAEs. Sensation grossly intact.  Psych: Normal mood and affect.   ED Results / Procedures / Treatments   Labs (all labs ordered are listed, but only abnormal results are displayed) Labs Reviewed  COMPREHENSIVE METABOLIC PANEL - Abnormal; Notable for the following components:      Result Value   Glucose, Bld 176 (*)    BUN 31 (*)     Albumin 2.6 (*)    AST 49 (*)    ALT 47 (*)    GFR, Estimated 56 (*)    All other components within normal limits  CBC WITH DIFFERENTIAL/PLATELET - Abnormal; Notable for the following components:   RBC 3.82 (*)    Hemoglobin 10.3 (*)    HCT 31.4 (*)    Platelets 458 (*)    All other components within normal limits  PROTIME-INR - Abnormal; Notable for the following components:   Prothrombin Time 33.1 (*)    INR 3.3 (*)    All other components within normal limits  URINALYSIS, ROUTINE W REFLEX MICROSCOPIC - Abnormal; Notable for the following components:   Ketones, ur 20 (*)    All other  components within normal limits  CBG MONITORING, ED - Abnormal; Notable for the following components:   Glucose-Capillary 188 (*)    All other components within normal limits  CULTURE, BLOOD (ROUTINE X 2)  CULTURE, BLOOD (ROUTINE X 2)  URINE CULTURE  LACTIC ACID, PLASMA    EKG EKG Interpretation  Date/Time:  Friday August 20 2022 16:32:59 EST Ventricular Rate:  100 PR Interval:  152 QRS Duration: 126 QT Interval:  331 QTC Calculation: 358 R Axis:   -5 Text Interpretation: Normal sinus rhythm Tremoring causing artifact limiting interpretation Confirmed by Cindee Lame [phone]) on 08/20/2022 9:06:53 PM  Radiology DG Foot Complete Left  Result Date: 08/20/2022 CLINICAL DATA:  Recent left foot trauma with pain, initial encounter EXAM: LEFT FOOT - COMPLETE 3+ VIEW COMPARISON:  02/14/2017 FINDINGS: No acute fracture is noted. There is apparent dorsal dislocation of the third proximal phalanx with respect to the metatarsal head. No other focal abnormality is noted. No soft tissue changes are seen. IMPRESSION: Findings suggestive of dorsal dislocation of the third proximal phalanx with respect to the metatarsal head. Electronically Signed   By: Inez Catalina M.D.   On: 08/20/2022 19:11   DG Chest Port 1 View  Result Date: 08/20/2022 CLINICAL DATA:  History of UTI, possible sepsis EXAM: PORTABLE  CHEST 1 VIEW COMPARISON:  08/11/2022 FINDINGS: Cardiac shadow is mildly prominent but stable. Aortic calcifications are again seen. Lungs are well aerated bilaterally. No focal infiltrate is seen. No bony abnormality is noted. IMPRESSION: No active disease. Electronically Signed   By: Inez Catalina M.D.   On: 08/20/2022 17:04    Procedures .Ortho Injury Treatment  Date/Time: 09/10/2022 9:24 PM  Performed by: Audley Hose, MD Authorized by: Audley Hose, MD   Consent:    Consent obtained:  Verbal   Consent given by:  Patient and guardian   Risks discussed:  Fracture, irreducible dislocation, stiffness, restricted joint movement, vascular damage, nerve damage and recurrent dislocation   Alternatives discussed:  No treatment, delayed treatment and immobilizationInjury location: foot Location details: left foot Injury type: dislocation Pre-procedure distal perfusion: normal Pre-procedure neurological function: normal Pre-procedure range of motion: reduced  Anesthesia: Local anesthesia used: no  Patient sedated: NoManipulation performed: yes Reduction method: direct traction (in two attempts) Reduction successful: no Immobilization: splint Splint type: short leg Splint Applied by: ED Nurse Post-procedure neurovascular assessment: post-procedure neurovascularly intact Post-procedure distal perfusion: normal Post-procedure neurological function: normal Post-procedure range of motion: unchanged       Medications Ordered in ED Medications  acetaminophen (TYLENOL) tablet 1,000 mg (1,000 mg Oral Given 08/20/22 2239)    ED Course/ Medical Decision Making/ A&P                          Medical Decision Making Amount and/or Complexity of Data Reviewed Labs: ordered. Decision-making details documented in ED Course. Radiology: ordered. Decision-making details documented in ED Course.  Risk OTC drugs.    This patient presents to the ED for concern of generalized weakness,  this involves an extensive number of treatment options, and is a complaint that carries with it a high risk of complications and morbidity.  I considered the following differential and admission for this acute, potentially life threatening condition.   MDM:    DDX for this patient's generalized weakness includes but is not limited to:  Infectious processes, severe metabolic derangements or electrolyte abnormalities, ischemia/ACS, heart failure, anemia, and intracranial/central processes.  Patient has no  focal neurodeficits on exam and low concern for intracranial or central process.  Given severely decreased p.o. intake consider electrolyte and derangements, renal injury, dehydration.  Patient has recurrent UTIs and will obtain a UA.  However patient does not report any of the right flank pain that she had prior to her admission to [facility].  She did also complete a course of antibiotics.  She has no chest pain or shortness of breath at this time but will evaluate with EKG and troponin.  No signs of peripheral volume overload. Daughter does also report chronic decline >2 months which general deconditioning could be contributing to her symptoms.    Clinical Course as of 08/20/22 2330  Fri Aug 20, 2022  1821 DG Chest [place] 1 View FINDINGS: Cardiac shadow is mildly prominent but stable. Aortic calcifications are again seen. Lungs are well aerated bilaterally. No focal infiltrate is seen. No bony abnormality is noted.  IMPRESSION: No active disease.   [HN]  1957 Urinalysis, Routine w reflex microscopic -Urine, Clean Catch(!) Neg [HN]  1957 DG Foot Complete Left FINDINGS: No acute fracture is noted. There is apparent dorsal dislocation of the third proximal phalanx with respect to the metatarsal head. No other focal abnormality is noted. No soft tissue changes are seen.  IMPRESSION: Findings suggestive of dorsal dislocation of the third proximal phalanx with respect to the  metatarsal head.   [HN]  1958 DG Chest Port 1 View FINDINGS: Cardiac shadow is mildly prominent but stable. Aortic calcifications are again seen. Lungs are well aerated bilaterally. No focal infiltrate is seen. No bony abnormality is noted.  IMPRESSION: No active disease.   [HN]  2026 Lactic Acid, Venous: 1.7 [HN]  2026 WBC: 7.3 No leukocytosis [HN]  2112 INR(!): 3.3 Patient does take xarelto and has no active bleeding, will have her follow this as an outpatient [HN]  2112 AST(!): 49 [HN]  2112 ALT(!): 47 Does have history of elevated LFTs in the past. She has no RUQ pain currently and this is mild transaminitis, can also f/u o/p [HN]  2113 Blood cultures and urine cultures are drawn, patient's daughter can be notified if positive [HN]  2145 Attempted to relocate patient's third MTP dislocation.  Felt like it relocated and possibly dislocated again.  Will assess with x-ray. [HN]  2212 DG Foot 2 Views Left X-ray demonstrates still dislocated third MTP.  Will attempt relocation again.  Concern the patient's arthritic joints may not relocate easily. [HN]  2248 Attempted relocation of patient's third MTP dislocation and was unsuccessful.  Patient request that I stop trying to relocated.  Left foot x-ray from 2018 reveals that it was from in-place and patient's daughter states that she did not begin complaining about it until Saturday.  This is likely an acute dislocation though it has been out of place for at least several days.  I discussed with patient and her daughter who would prefer to follow-up as an outpatient and be discharged.  I will give them information to call podiatry on Monday morning for follow-up. [HN]    Clinical Course User Index [HN] Audley Hose, MD    Labs: I Ordered, and personally interpreted labs.  The pertinent results include:  those listed above  Imaging Studies ordered: I ordered imaging studies including CXR, L foot XR I independently visualized and  interpreted imaging. I agree with the radiologist interpretation  Additional history obtained from chart review, daughter at bedside.  External records from outside source obtained and reviewed.  Cardiac Monitoring: The patient was maintained on a cardiac monitor.  I personally viewed and interpreted the cardiac monitored which showed an underlying rhythm of: NSR  Reevaluation: After the interventions noted above, I reevaluated the patient and found that they have :improved  Social Determinants of Health: Patient lives independently with her husband  Disposition:  Considered admission but do not believe emergent condition is present. Will DC w/ o/p f/u/.   Co morbidities that complicate the patient evaluation  Past Medical History:  Diagnosis Date   Arthritis    Atrial fibrillation (HCC)    Chronic atrial fibrillation (HCC)    CKD (chronic kidney disease)    Hypertension    Hypothyroidism    Incontinence    RA (rheumatoid arthritis) ([place])      Medicines No orders of the defined types were placed in this encounter.   I have reviewed the patients home medicines and have made adjustments as needed  Problem List / ED Course: Problem List Items Addressed This Visit       Other   Generalized weakness - Primary   Other Visit Diagnoses     Closed dislocation of third toe of left foot, initial encounter                       This note was created using dictation software, which may contain spelling or grammatical errors.    Audley Hose, MD 09/10/22 2123    Audley Hose, MD 09/10/22 2126

## 2022-08-20 NOTE — ED Triage Notes (Signed)
EMS states that the pt has been fighting a UTI for the last 3 weeks. She has been on 3 different antibiotics, she is currently still feeling weak. Has a hx of dementia.

## 2022-08-20 NOTE — ED Provider Notes (Incomplete)
[facility] Provider Note   CSN: [id number] Arrival date & time: 08/20/22  1550     History {Add pertinent medical, surgical, social history, OB history to HPI:1} Chief Complaint  Patient presents with  . Weakness  . Urinary Tract Infection    Zoe Robinson is a 89 y.o. female with dementia, T2DM, CKD, A-fib, IDA, pulmonary hypertension, rheumatoid arthritis, hypercalcemia who presents with generalized weakness and concern for UTI.   Patient has history of dementia and is a poor historian at baseline. Daughter provides history.   EMS states that the pt has been fighting a UTI for the last 3 weeks. She has been seen by urology and placed on 3 different antibiotics, she is currently still feeling weak.  Daughter notes a general decline in patient's status for >2 months. Started having R flank pain and last Tuesday patient went to [facility] by EMS for c/f UTI for R side pain and fever on presentation. Stayed there for 2 or 3 days. Was discharged on Friday last week and hasn't really improved. DC'd on antibiotic but NP came and changed it yesterday. Had previously been able to walk with a walker to the bathroom but then this morning she couldn't get out of bed d/t weakness. Has had decreased PO intake, moaning and c/o gen weakness. Nausea and dry heaving, cold sweats. Also w/ watery diarrhea that improved after change of antibiotics. Patient lives with her husband and has NP that comes in.  Daughter states that they did stumble a little bit when transferring her to wheelchair the other day and her left foot has been swollen since then and she has complained of pain in the foot.    Weakness Urinary Tract Infection      Home Medications Prior to Admission medications   Medication Sig Start Date End Date Taking? Authorizing Provider  acetaminophen (TYLENOL) 500 MG tablet Take 1,000 mg by mouth as needed.     [provider]   Cranberry 500 MG TABS Take 1 tablet by mouth 2 (two) times daily.    [provider]  denosumab (PROLIA) 60 MG/ML SOLN injection Inject 60 mg into the skin every 6 (six) months. Administer in upper arm, thigh, or abdomen    [provider]  donepezil (ARICEPT) 10 MG tablet Take 10 mg by mouth at bedtime.    [provider]  feeding supplement, ENSURE COMPLETE, (ENSURE COMPLETE) LIQD Take 237 mLs by mouth 3 (three) times daily between meals. Patient taking differently: Take 237 mLs by mouth daily as needed (nutrition). 04/26/14   Geradine Girt, DO  ferrous sulfate 325 (65 FE) MG tablet Take 325 mg by mouth daily with breakfast.    [provider]  folic acid (FOLVITE) 1 MG tablet Take 1 tablet (1 mg total) by mouth daily. 03/18/21   Ofilia Neas, PA-C  irbesartan (AVAPRO) 300 MG tablet Take 300 mg by mouth daily. 05/05/18   [provider]  Lactobacillus TABS Take 1 tablet by mouth daily.    [provider]  Magnesium 250 MG TABS Take 1 tablet by mouth daily.    [provider]  mirabegron ER (MYRBETRIQ) 50 MG TB24 tablet Take 50 mg by mouth daily.    [provider]  Multiple Vitamins-Minerals (MULTIVITAMIN PO) Take 1 tablet by mouth every morning.     [provider]  nitroGLYCERIN (NITROSTAT) 0.4 MG SL tablet Place 0.4 mg under the tongue every 5 (  five) minutes as needed for chest pain.    [provider]  NP THYROID 15 MG tablet Take 15 mg by mouth daily. Patient not taking: Reported on 09/16/2021 04/03/21   [provider]  oxybutynin (DITROPAN-XL) 10 MG 24 hr tablet Take 10 mg by mouth every morning.     [provider]  polyethylene glycol (MIRALAX / GLYCOLAX) packet Take 17 g by mouth daily as needed. 04/26/14   Geradine Girt, DO  predniSONE (DELTASONE) 5 MG tablet TAKE 1 TABLET(5 MG) BY MOUTH DAILY WITH BREAKFAST Patient taking differently: Take 5 mg by mouth daily with breakfast.  03/18/21   Ofilia Neas, PA-C  psyllium (METAMUCIL) 58.6 % packet Take 1 packet by mouth daily as needed (fiber).    [provider]  simvastatin (ZOCOR) 40 MG tablet Take 40 mg by mouth every evening.    [provider]  vitamin E 400 UNIT capsule Take 400 Units by mouth daily.    [provider]  XARELTO 20 MG TABS tablet Take 20 mg by mouth every evening. 10/25/19   [provider]      Allergies    Fentanyl    Review of Systems   Review of Systems  Neurological:  Positive for weakness.   Review of systems provided by daughter and is Negative for f/c.  A 10 point review of systems was performed and is negative unless otherwise reported in HPI.  Physical Exam Updated Vital Signs BP (!) 131/102   Pulse 73   Temp (!) 96.6 F (35.9 C) (Rectal)   Resp 20   Ht 4\' 11"  (1.499 m)   Wt 51.3 kg   SpO2 96%   BMI 22.84 kg/m  Physical Exam General: Elderly- appearing female, lying in bed.  HEENT: PERRLA, Sclera anicteric, MMM, trachea midline.  Cardiology: RRR, no murmurs/rubs/gallops. BL radial and DP pulses equal bilaterally.  Resp: Normal respiratory rate and effort. CTAB, no wheezes, rhonchi, crackles.  Abd: Soft, non-tender, non-distended. No rebound tenderness or guarding.  GU: Deferred. MSK: TTP to left midfoot with arthritic toes, left 3-5th toes flexed and crossed over one another. Intact DP/PT pulses bilaterally.  No peripheral edema or signs of trauma. Extremities without deformity or TTP. No cyanosis or clubbing. Skin: warm, dry. No rashes or lesions. Back: No CVA tenderness Neuro: A&Ox4, CNs II-XII grossly intact. MAEs. Sensation grossly intact.  Psych: Normal mood and affect.   ED Results / Procedures / Treatments   Labs (all labs ordered are listed, but only abnormal results are displayed) Labs Reviewed  COMPREHENSIVE METABOLIC PANEL - Abnormal; Notable for the following components:      Result Value   Glucose, Bld 176 (*)    BUN  31 (*)    Albumin 2.6 (*)    AST 49 (*)    ALT 47 (*)    GFR, Estimated 56 (*)    All other components within normal limits  CBC WITH DIFFERENTIAL/PLATELET - Abnormal; Notable for the following components:   RBC 3.82 (*)    Hemoglobin 10.3 (*)    HCT 31.4 (*)    Platelets 458 (*)    All other components within normal limits  PROTIME-INR - Abnormal; Notable for the following components:   Prothrombin Time 33.1 (*)    INR 3.3 (*)    All other components within normal limits  URINALYSIS, ROUTINE W REFLEX MICROSCOPIC - Abnormal; Notable for the following components:   Ketones, ur 20 (*)  All other components within normal limits  CBG MONITORING, ED - Abnormal; Notable for the following components:   Glucose-Capillary 188 (*)    All other components within normal limits  CULTURE, BLOOD (ROUTINE X 2)  CULTURE, BLOOD (ROUTINE X 2)  URINE CULTURE  LACTIC ACID, PLASMA    EKG None  Radiology DG Foot Complete Left  Result Date: 08/20/2022 CLINICAL DATA:  Recent left foot trauma with pain, initial encounter EXAM: LEFT FOOT - COMPLETE 3+ VIEW COMPARISON:  02/14/2017 FINDINGS: No acute fracture is noted. There is apparent dorsal dislocation of the third proximal phalanx with respect to the metatarsal head. No other focal abnormality is noted. No soft tissue changes are seen. IMPRESSION: Findings suggestive of dorsal dislocation of the third proximal phalanx with respect to the metatarsal head. Electronically Signed   By: Alcide Clever M.D.   On: 08/20/2022 19:11   DG Chest Port 1 View  Result Date: 08/20/2022 CLINICAL DATA:  History of UTI, possible sepsis EXAM: PORTABLE CHEST 1 VIEW COMPARISON:  08/11/2022 FINDINGS: Cardiac shadow is mildly prominent but stable. Aortic calcifications are again seen. Lungs are well aerated bilaterally. No focal infiltrate is seen. No bony abnormality is noted. IMPRESSION: No active disease. Electronically Signed   By: Alcide Clever M.D.   On: 08/20/2022 17:04     Procedures Procedures  {Document cardiac monitor, telemetry assessment procedure when appropriate:1}  Medications Ordered in ED Medications - No data to display  ED Course/ Medical Decision Making/ A&P                          Medical Decision Making Amount and/or Complexity of Data Reviewed Labs: ordered. Decision-making details documented in ED Course. Radiology: ordered. Decision-making details documented in ED Course.  Risk OTC drugs.    This patient presents to the ED for concern of generalized weakness, this involves an extensive number of treatment options, and is a complaint that carries with it a high risk of complications and morbidity.  I considered the following differential and admission for this acute, potentially life threatening condition.   MDM:    *** DDX for generalized weakness includes but is not limited to:  Infectious processes, severe metabolic derangements or electrolyte abnormalities, ischemia/ACS, heart failure, anemia, and intracranial/central processes.  Patient has no focal neurodeficits on exam and low concern for intracranial or central process.  Given severely decreased p.o. intake consider electrolyte and derangements, renal injury, dehydration.  Patient has recurrent UTIs and will obtain a UA.  However patient does not report any of the right flank pain that she had prior to her admission to [facility].  She did also complete a course of antibiotics.  She has no chest pain or shortness of breath at this time but will evaluate with EKG and troponin.  No signs of peripheral volume overload.   Clinical Course as of 08/20/22 2330  Fri Aug 20, 2022  1821 DG Chest [place] 1 View FINDINGS: Cardiac shadow is mildly prominent but stable. Aortic calcifications are again seen. Lungs are well aerated bilaterally. No focal infiltrate is seen. No bony abnormality is noted.  IMPRESSION: No active disease.   [HN]  1957 Urinalysis, Routine w reflex  microscopic -Urine, Clean Catch(!) Neg [HN]  1957 DG Foot Complete Left FINDINGS: No acute fracture is noted. There is apparent dorsal dislocation of the third proximal phalanx with respect to the metatarsal head. No other focal abnormality is noted. No soft tissue changes  are seen.  IMPRESSION: Findings suggestive of dorsal dislocation of the third proximal phalanx with respect to the metatarsal head.   [HN]  1958 DG Chest Port 1 View FINDINGS: Cardiac shadow is mildly prominent but stable. Aortic calcifications are again seen. Lungs are well aerated bilaterally. No focal infiltrate is seen. No bony abnormality is noted.  IMPRESSION: No active disease.   [HN]  2026 Lactic Acid, Venous: 1.7 [HN]  2026 WBC: 7.3 No leukocytosis [HN]  2112 INR(!): 3.3 Patient does take xarelto and has no active bleeding, will have her follow this as an outpatient [HN]  2112 AST(!): 49 [HN]  2112 ALT(!): 47 Does have history of elevated LFTs in the past. She has no RUQ pain currently and this is mild transaminitis, can also f/u o/p [HN]  2113 Blood cultures and urine cultures are drawn, patient's daughter can be notified if positive [HN]  2145 Attempted to relocate patient's third MTP dislocation.  Felt like it relocated and possibly dislocated again.  Will assess with x-ray. [HN]  2212 DG Foot 2 Views Left X-ray demonstrates still dislocated third MTP.  Will attempt relocation again.  Concern the patient's arthritic joints may not relocate easily. [HN]  2248 Attempted relocation of patient's third MTP dislocation and was unsuccessful.  Patient request that I stop trying to relocated.  Left foot x-ray from 2018 reveals that it was from in-place and patient's daughter states that she did not begin complaining about it until Saturday.  This is likely an acute dislocation.  I discussed with patient and her daughter who would prefer to follow-up as an outpatient and be discharged.  I will give them  information to call podiatry on Monday morning for follow-up. [HN]    Clinical Course User Index [HN] Loetta Rough, MD    Labs: I Ordered, and personally interpreted labs.  The pertinent results include:  ***  Imaging Studies ordered: I ordered imaging studies including *** I independently visualized and interpreted imaging. I agree with the radiologist interpretation  Additional history obtained from ***.  External records from outside source obtained and reviewed including ***  Cardiac Monitoring: .The patient was maintained on a cardiac monitor.  I personally viewed and interpreted the cardiac monitored which showed an underlying rhythm of: NSR  Reevaluation: After the interventions noted above, I reevaluated the patient and found that they have :{resolved/improved/worsened:23923::"improved"}  Social Determinants of Health: .Patient lives independently with her husband  Disposition:  ***  Co morbidities that complicate the patient evaluation . Past Medical History:  Diagnosis Date  . Arthritis   . Atrial fibrillation (HCC)   . Chronic atrial fibrillation (HCC)   . CKD (chronic kidney disease)   . Hypertension   . Hypothyroidism   . Incontinence   . RA (rheumatoid arthritis) (HCC)      Medicines No orders of the defined types were placed in this encounter.   I have reviewed the patients home medicines and have made adjustments as needed  Problem List / ED Course: Problem List Items Addressed This Visit   None        {Document critical care time when appropriate:1} {Document review of labs and clinical decision tools ie heart score, Chads2Vasc2 etc:1}  {Document your independent review of radiology images, and any outside records:1} {Document your discussion with family members, caretakers, and with consultants:1} {Document social determinants of health affecting pt's care:1} {Document your decision making why or why not admission, treatments were  needed:1}  This note was created  using dictation software, which may contain spelling or grammatical errors.

## 2022-08-22 LAB — URINE CULTURE: Culture: NO GROWTH

## 2022-08-25 LAB — CULTURE, BLOOD (ROUTINE X 2)
Culture: NO GROWTH
Culture: NO GROWTH
Special Requests: ADEQUATE
Special Requests: ADEQUATE

## 2022-09-08 NOTE — Progress Notes (Deleted)
Office Visit Note  Patient: Zoe Robinson             Date of Birth: Jul 28, 1931           MRN: [id number]             PCP: Bonnita Nasuti, MD Referring: Zoila Shutter, NP Visit Date: 09/22/2022 Occupation: @GUAROCC @  Subjective:  No chief complaint on file.   History of Present Illness: Tinie Mcgloin is a 89 y.o. female ***     Activities of Daily Living:  Patient reports morning stiffness for *** {minute/hour:19697}.   Patient {ACTIONS;DENIES/REPORTS:21021675::"Denies"} nocturnal pain.  Difficulty dressing/grooming: {ACTIONS;DENIES/REPORTS:21021675::"Denies"} Difficulty climbing stairs: {ACTIONS;DENIES/REPORTS:21021675::"Denies"} Difficulty getting out of chair: {ACTIONS;DENIES/REPORTS:21021675::"Denies"} Difficulty using hands for taps, buttons, cutlery, and/or writing: {ACTIONS;DENIES/REPORTS:21021675::"Denies"}  No Rheumatology ROS completed.   PMFS History:  Patient Active Problem List   Diagnosis Date Noted  . COVID-19 virus infection 06/30/2019  . High risk medication use 03/25/2017  . Rheumatoid arthritis involving multiple sites with positive rheumatoid factor ([place]) 03/15/2017  . Rheumatoid arthritis involving both knees with positive rheumatoid factor ([place]) 03/15/2017  . Rheumatoid arthritis involving both hands with positive rheumatoid factor ([place]) 03/15/2017  . Rheumatoid arthritis involving both feet with positive rheumatoid factor ([place]) 03/15/2017  . History of vertebral fracture 03/15/2017  . On prednisone therapy 02/10/2017  . Age-related osteoporosis without current pathological fracture 02/10/2017  . History of pulmonary hypertension 02/10/2017  . History of atrial fibrillation 02/10/2017  . History of anemia 02/10/2017  . History of recurrent UTIs 02/10/2017  . History of hypercalcemia 02/10/2017  . On anticoagulant therapy 02/10/2017  . Type 2 diabetes mellitus without complication, without long-term current use of insulin ([place]) 02/19/2016   . Acute encephalopathy 08/19/2014  . Neutropenia ([place]) 04/20/2014  . Fever, unspecified 04/20/2014  . Unspecified constipation 04/20/2014  . Hypercalcemia 04/15/2014  . Acute renal failure ([place]) 04/15/2014  . Generalized weakness 04/15/2014  . Dehydration 04/15/2014  . UTI (urinary tract infection) 04/15/2014  . Iron deficiency anemia 04/15/2014  . CKD (chronic kidney disease)   . Hypertension   . Atrial fibrillation ([place])   . Hyperlipidemia 01/08/2013  . Pulmonary hypertension ([place]) 01/08/2013    Past Medical History:  Diagnosis Date  . Arthritis   . Atrial fibrillation ([place])   . Chronic atrial fibrillation ([place])   . CKD (chronic kidney disease)   . Hypertension   . Hypothyroidism   . Incontinence   . RA (rheumatoid arthritis) (HCC)     Family History  Problem Relation Age of Onset  . Heart attack Mother   . Cancer Mother   . Hypertension Mother   . Heart disease Mother   . Heart attack Father   . Stomach cancer Father   . Hypertension Father   . Heart disease Father   . Heart attack Brother   . Heart attack Brother   . Stomach cancer Brother   . Cancer Sister   . Dementia Sister   . Osteoarthritis Daughter   . Depression Daughter   . Hypothyroidism Daughter   . Hypercholesterolemia Daughter   . Heart disease Son   . Hypertension Son   . Gout Son    Past Surgical History:  Procedure Laterality Date  . ABDOMINAL HYSTERECTOMY  1990   Social History   Social History Narrative  . Not on file   Immunization History  Administered Date(s) Administered  . Influenza,inj,Quad PF,6+ Mos 04/19/2014  . Moderna Sars-Covid-2 Vaccination 07/21/2019, 08/21/2019  . Pneumococcal Conjugate-13 05/16/2015  .  Pneumococcal Polysaccharide-23 10/25/2010, 04/17/2014  . Tdap 10/25/2010     Objective: Vital Signs: There were no vitals taken for this visit.   Physical Exam   Musculoskeletal Exam: ***  CDAI Exam: CDAI Score: -- Patient Global: --; Provider Global:  -- Swollen: --; Tender: -- Joint Exam 09/22/2022   No joint exam has been documented for this visit   There is currently no information documented on the homunculus. Go to the Rheumatology activity and complete the homunculus joint exam.  Investigation: No additional findings.  Imaging: DG Foot 2 Views Left  Result Date: 08/20/2022 CLINICAL DATA:  Reduction EXAM: LEFT FOOT - 2 VIEW COMPARISON:  08/20/2022 FINDINGS: No definitive fracture. Persistent abnormal appearance at the third MTP joint with probable dorsal displacement of the third proximal phalanx with respect to the head of the metatarsal. Alignment is unchanged IMPRESSION: Persistent abnormal appearance at the third MTP joint with probable dorsal displacement of the third proximal phalanx with respect to the head of the metatarsal. Electronically Signed   By: Donavan Foil M.D.   On: 08/20/2022 22:13   DG Foot Complete Left  Result Date: 08/20/2022 CLINICAL DATA:  Recent left foot trauma with pain, initial encounter EXAM: LEFT FOOT - COMPLETE 3+ VIEW COMPARISON:  02/14/2017 FINDINGS: No acute fracture is noted. There is apparent dorsal dislocation of the third proximal phalanx with respect to the metatarsal head. No other focal abnormality is noted. No soft tissue changes are seen. IMPRESSION: Findings suggestive of dorsal dislocation of the third proximal phalanx with respect to the metatarsal head. Electronically Signed   By: Inez Catalina M.D.   On: 08/20/2022 19:11   DG Chest Port 1 View  Result Date: 08/20/2022 CLINICAL DATA:  History of UTI, possible sepsis EXAM: PORTABLE CHEST 1 VIEW COMPARISON:  08/11/2022 FINDINGS: Cardiac shadow is mildly prominent but stable. Aortic calcifications are again seen. Lungs are well aerated bilaterally. No focal infiltrate is seen. No bony abnormality is noted. IMPRESSION: No active disease. Electronically Signed   By: Inez Catalina M.D.   On: 08/20/2022 17:04    Recent Labs: Lab Results   Component Value Date   WBC 7.3 08/20/2022   HGB 10.3 (L) 08/20/2022   PLT 458 (H) 08/20/2022   NA 136 08/20/2022   K 4.3 08/20/2022   CL 99 08/20/2022   CO2 26 08/20/2022   GLUCOSE 176 (H) 08/20/2022   BUN 31 (H) 08/20/2022   CREATININE 0.98 08/20/2022   BILITOT 0.7 08/20/2022   ALKPHOS 46 08/20/2022   AST 49 (H) 08/20/2022   ALT 47 (H) 08/20/2022   PROT 6.9 08/20/2022   ALBUMIN 2.6 (L) 08/20/2022   CALCIUM 8.9 08/20/2022   GFRAA 55 (L) 10/16/2020    Speciality Comments: No specialty comments available.  Procedures:  No procedures performed Allergies: Fentanyl   Assessment / Plan:     Visit Diagnoses: Rheumatoid arthritis involving multiple sites with positive rheumatoid factor (HCC)  High risk medication use  On prednisone therapy  Age-related osteoporosis without current pathological fracture  History of vertebral fracture  On anticoagulant therapy  History of pulmonary hypertension  History of atrial fibrillation  Orders: No orders of the defined types were placed in this encounter.  No orders of the defined types were placed in this encounter.   Face-to-face time spent with patient was *** minutes. Greater than 50% of time was spent in counseling and coordination of care.  Follow-Up Instructions: No follow-ups on file.   Ofilia Neas, PA-C  Note - This record has been created using Dragon software.  Chart creation errors have been sought, but may not always  have been located. Such creation errors do not reflect on  the standard of medical care.  

## 2022-09-22 ENCOUNTER — Ambulatory Visit: Payer: Medicare Other | Admitting: Physician Assistant

## 2022-09-22 DIAGNOSIS — Z79899 Other long term (current) drug therapy: Secondary | ICD-10-CM

## 2022-09-22 DIAGNOSIS — M810 Age-related osteoporosis without current pathological fracture: Secondary | ICD-10-CM

## 2022-09-22 DIAGNOSIS — Z7901 Long term (current) use of anticoagulants: Secondary | ICD-10-CM

## 2022-09-22 DIAGNOSIS — Z8679 Personal history of other diseases of the circulatory system: Secondary | ICD-10-CM

## 2022-09-22 DIAGNOSIS — Z7952 Long term (current) use of systemic steroids: Secondary | ICD-10-CM

## 2022-09-22 DIAGNOSIS — M0579 Rheumatoid arthritis with rheumatoid factor of multiple sites without organ or systems involvement: Secondary | ICD-10-CM

## 2022-09-22 DIAGNOSIS — Z8781 Personal history of (healed) traumatic fracture: Secondary | ICD-10-CM

## 2023-05-28 ENCOUNTER — Emergency Department (HOSPITAL_COMMUNITY)
Admission: EM | Admit: 2023-05-28 | Discharge: 2023-05-29 | Disposition: A | Discharge status: Home / Self Care | Payer: Medicare Other | Attending: Emergency Medicine | Admitting: Emergency Medicine

## 2023-05-28 ENCOUNTER — Other Ambulatory Visit: Payer: Self-pay

## 2023-05-28 ENCOUNTER — Encounter (HOSPITAL_COMMUNITY): Payer: Self-pay | Admitting: Emergency Medicine

## 2023-05-28 DIAGNOSIS — E860 Dehydration: Secondary | ICD-10-CM | POA: Diagnosis not present

## 2023-05-28 DIAGNOSIS — R911 Solitary pulmonary nodule: Secondary | ICD-10-CM | POA: Diagnosis not present

## 2023-05-28 DIAGNOSIS — R1011 Right upper quadrant pain: Secondary | ICD-10-CM

## 2023-05-28 DIAGNOSIS — Z7901 Long term (current) use of anticoagulants: Secondary | ICD-10-CM | POA: Diagnosis not present

## 2023-05-28 DIAGNOSIS — N390 Urinary tract infection, site not specified: Secondary | ICD-10-CM | POA: Diagnosis not present

## 2023-05-28 DIAGNOSIS — R011 Cardiac murmur, unspecified: Secondary | ICD-10-CM | POA: Insufficient documentation

## 2023-05-28 LAB — CBC
HCT: 33 % — ABNORMAL LOW (ref 36.0–46.0)
Hemoglobin: 10.9 g/dL — ABNORMAL LOW (ref 12.0–15.0)
MCH: 28.3 pg (ref 26.0–34.0)
MCHC: 33 g/dL (ref 30.0–36.0)
MCV: 85.7 fL (ref 80.0–100.0)
Platelets: 369 10*3/uL (ref 150–400)
RBC: 3.85 MIL/uL — ABNORMAL LOW (ref 3.87–5.11)
RDW: 13.3 % (ref 11.5–15.5)
WBC: 9.6 10*3/uL (ref 4.0–10.5)
nRBC: 0 % (ref 0.0–0.2)

## 2023-05-28 LAB — URINALYSIS, ROUTINE W REFLEX MICROSCOPIC
Bilirubin Urine: NEGATIVE
Glucose, UA: NEGATIVE mg/dL
Hgb urine dipstick: NEGATIVE
Ketones, ur: NEGATIVE mg/dL
Nitrite: POSITIVE — AB
Protein, ur: NEGATIVE mg/dL
Specific Gravity, Urine: 1.015 (ref 1.005–1.030)
WBC, UA: >50 WBC/hpf (ref 0–5)
pH: 7 (ref 5.0–8.0)

## 2023-05-28 LAB — COMPREHENSIVE METABOLIC PANEL
ALT: 22 U/L (ref 0–44)
AST: 22 U/L (ref 15–41)
Albumin: 3.2 g/dL — ABNORMAL LOW (ref 3.5–5.0)
Alkaline Phosphatase: 53 U/L (ref 38–126)
Anion gap: 9 (ref 5–15)
BUN: 39 mg/dL — ABNORMAL HIGH (ref 8–23)
CO2: 26 mmol/L (ref 22–32)
Calcium: 9 mg/dL (ref 8.9–10.3)
Chloride: 98 mmol/L (ref 98–111)
Creatinine, Ser: 1.3 mg/dL — ABNORMAL HIGH (ref 0.44–1.00)
GFR, Estimated: 39 mL/min — ABNORMAL LOW (ref >60)
Glucose, Bld: 84 mg/dL (ref 70–99)
Potassium: 5.1 mmol/L (ref 3.5–5.1)
Sodium: 133 mmol/L — ABNORMAL LOW (ref 135–145)
Total Bilirubin: 0.6 mg/dL (ref 0.3–1.2)
Total Protein: 7.7 g/dL (ref 6.5–8.1)

## 2023-05-28 LAB — LIPASE, BLOOD: Lipase: 39 U/L (ref 11–51)

## 2023-05-28 NOTE — ED Triage Notes (Signed)
Pt arrived POV with daughter, has had Covid x2 weeks, c/o nausea, abd pain to the RUQ, right rib pain, chest x-ray done yesterday outpatient not resulted yet. PCP is concerned for gallstones, wanted pt to f/u at the ED.

## 2023-05-29 ENCOUNTER — Emergency Department (HOSPITAL_COMMUNITY): Payer: Medicare Other

## 2023-05-29 LAB — TROPONIN I (HIGH SENSITIVITY): Troponin I (High Sensitivity): 6 ng/L (ref <18)

## 2023-05-29 MED ORDER — CEFTRIAXONE SODIUM 1 G IJ SOLR
1.0000 g | Freq: Once | INTRAMUSCULAR | Status: AC
  Administered 2023-05-29: 1 g via INTRAVENOUS
  Filled 2023-05-29: qty 10

## 2023-05-29 MED ORDER — IOHEXOL 350 MG/ML SOLN
100.0000 mL | Freq: Once | INTRAVENOUS | Status: AC | PRN
  Administered 2023-05-29: 100 mL via INTRAVENOUS

## 2023-05-29 MED ORDER — SODIUM CHLORIDE 0.9 % IV BOLUS
500.0000 mL | Freq: Once | INTRAVENOUS | Status: AC
  Administered 2023-05-29: 500 mL via INTRAVENOUS

## 2023-05-29 MED ORDER — CEPHALEXIN 500 MG PO CAPS: 500.0000 mg | ORAL_CAPSULE | Freq: Two times a day (BID) | ORAL | 0 refills | Status: AC

## 2023-05-29 NOTE — ED Provider Notes (Signed)
[facility] AT [facility] Provider Note   CSN: [id number] Arrival date & time: 05/28/23  1950     History  Chief Complaint  Patient presents with   Abdominal Pain    Zoe Robinson is a 89 y.o. female.  The history is provided by the patient, a relative and medical records.  Abdominal Pain Zoe Robinson is a 89 y.o. female who presents to the Emergency Department complaining of abdominal pain. She presents to the ED accompanied by her daughter for evaluation of RUQ pain that started about a week ago.  She was diagnosed with COVID on 10/21 and started on mulnipivir.  She completed the course.  Daughter brings her in today due to RUQ pain for the last week as well as worsening weakness and cough.  Has a hx/o RA on chronic prednisone as well as DVT on Xarelto.  No fever.  Has nausea, no vomiting, diarrhea, dysuria.       Home Medications Prior to Admission medications   Medication Sig Start Date End Date Taking? Authorizing Provider  cephALEXin (KEFLEX) 500 MG capsule Take 1 capsule (500 mg total) by mouth 2 (two) times daily. 05/29/23  Yes Tilden Fossa, MD  acetaminophen (TYLENOL) 500 MG tablet Take 1,000 mg by mouth as needed.     [provider]  Cranberry 500 MG TABS Take 1 tablet by mouth 2 (two) times daily.    [provider]  denosumab (PROLIA) 60 MG/ML SOLN injection Inject 60 mg into the skin every 6 (six) months. Administer in upper arm, thigh, or abdomen    [provider]  donepezil (ARICEPT) 10 MG tablet Take 10 mg by mouth at bedtime.    [provider]  feeding supplement, ENSURE COMPLETE, (ENSURE COMPLETE) LIQD Take 237 mLs by mouth 3 (three) times daily between meals. Patient taking differently: Take 237 mLs by mouth daily as needed (nutrition). 04/26/14   Joseph Art, DO  ferrous sulfate 325 (65 FE) MG tablet Take 325 mg by mouth daily with breakfast.    [provider]  folic  acid (FOLVITE) 1 MG tablet Take 1 tablet (1 mg total) by mouth daily. 03/18/21   Gearldine Bienenstock, PA-C  irbesartan (AVAPRO) 300 MG tablet Take 300 mg by mouth daily. 05/05/18   [provider]  Lactobacillus TABS Take 1 tablet by mouth daily.    [provider]  Magnesium 250 MG TABS Take 1 tablet by mouth daily.    [provider]  mirabegron ER (MYRBETRIQ) 50 MG TB24 tablet Take 50 mg by mouth daily.    [provider]  Multiple Vitamins-Minerals (MULTIVITAMIN PO) Take 1 tablet by mouth every morning.     [provider]  nitroGLYCERIN (NITROSTAT) 0.4 MG SL tablet Place 0.4 mg under the tongue every 5 (five) minutes as needed for chest pain.    [provider]  NP THYROID 15 MG tablet Take 15 mg by mouth daily. Patient not taking: Reported on 09/16/2021 04/03/21   [provider]  oxybutynin (DITROPAN-XL) 10 MG 24 hr tablet Take 10 mg by mouth every morning.     [provider]  polyethylene glycol (MIRALAX / GLYCOLAX) packet Take 17 g by mouth daily as needed. 04/26/14   Joseph Art, DO  predniSONE (DELTASONE) 5 MG tablet TAKE 1 TABLET(5 MG) BY MOUTH DAILY WITH BREAKFAST Patient taking differently: Take 5 mg by mouth daily with breakfast. 03/18/21   Gearldine Bienenstock, PA-C  psyllium (METAMUCIL) 58.6 % packet Take 1 packet by mouth daily as needed (fiber).    [provider]  simvastatin (ZOCOR) 40 MG tablet Take 40 mg by mouth every evening.    [provider]  vitamin E 400 UNIT capsule Take 400 Units by mouth daily.    [provider]  XARELTO 20 MG TABS tablet Take 20 mg by mouth every evening. 10/25/19   [provider]      Allergies    Fentanyl    Review of Systems   Review of Systems  Gastrointestinal:  Positive for abdominal pain.  All other systems reviewed and are negative.   Physical Exam Updated Vital Signs BP (!) 160/94 (BP Location: Right Arm)   Pulse 90   Temp 98.1 F  (36.7 C) (Oral)   Resp 18   Ht 4\' 11"  (1.499 m)   Wt 51.3 kg   SpO2 97%   BMI 22.84 kg/m  Physical Exam Vitals and nursing note reviewed.  Constitutional:      Appearance: She is well-developed.  HENT:     Head: Normocephalic and atraumatic.  Cardiovascular:     Rate and Rhythm: Normal rate and regular rhythm.     Heart sounds: Murmur heard.  Pulmonary:     Effort: Pulmonary effort is normal. No respiratory distress.     Breath sounds: Normal breath sounds.  Abdominal:     Palpations: Abdomen is soft.     Tenderness: There is no guarding or rebound.     Comments: Mild RUQ tenderness  Musculoskeletal:        General: No tenderness.     Comments: Trace edema to BLE, left greater than right  Skin:    General: Skin is warm and dry.  Neurological:     Mental Status: She is alert.     Comments: Mildly confused, globally weak.   Psychiatric:        Behavior: Behavior normal.     ED Results / Procedures / Treatments   Labs (all labs ordered are listed, but only abnormal results are displayed) Labs Reviewed  COMPREHENSIVE METABOLIC PANEL - Abnormal; Notable for the following components:      Result Value   Sodium 133 (*)    BUN 39 (*)    Creatinine, Ser 1.30 (*)    Albumin 3.2 (*)    GFR, Estimated 39 (*)    All other components within normal limits  CBC - Abnormal; Notable for the following components:   RBC 3.85 (*)    Hemoglobin 10.9 (*)    HCT 33.0 (*)    All other components within normal limits  URINALYSIS, ROUTINE W REFLEX MICROSCOPIC - Abnormal; Notable for the following components:   APPearance HAZY (*)    Nitrite POSITIVE (*)    Leukocytes,Ua LARGE (*)    Bacteria, UA FEW (*)    All other components within normal limits  URINE CULTURE  LIPASE, BLOOD  TROPONIN I (HIGH SENSITIVITY)    EKG EKG Interpretation Date/Time:  Saturday May 28 2023 20:36:31 EST Ventricular Rate:  73 PR Interval:  156 QRS Duration:  84 QT Interval:  364 QTC  Calculation: 401 R Axis:   -28  Text Interpretation: Normal sinus rhythm with sinus arrhythmia Normal ECG Poor data quality in current ECG precludes serial comparison Confirmed by Tilden Fossa [phone]) on 05/29/2023 2:19:01 AM  Radiology CT Angio Chest PE W/Cm &/Or Wo Cm  Result Date: 05/29/2023 CLINICAL DATA:  Right lower quadrant  abdominal pain. High probability for pulmonary embolism. EXAM: CT ANGIOGRAPHY CHEST CT ABDOMEN AND PELVIS WITH CONTRAST TECHNIQUE: Multidetector CT imaging of the chest was performed using the standard protocol during bolus administration of intravenous contrast. Multiplanar CT image reconstructions and MIPs were obtained to evaluate the vascular anatomy. Multidetector CT imaging of the abdomen and pelvis was performed using the standard protocol during bolus administration of intravenous contrast. RADIATION DOSE REDUCTION: This exam was performed according to the departmental dose-optimization program which includes automated exposure control, adjustment of the mA and/or kV according to patient size and/or use of iterative reconstruction technique. CONTRAST:  OMNIPAQUE IOHEXOL 350 MG/ML SOLN COMPARISON:  04/19/2014 FINDINGS: CTA CHEST FINDINGS Cardiovascular: Satisfactory opacification of the pulmonary arteries to the segmental level. No evidence of pulmonary embolism. Enlarged heart with small pericardial effusion seen posteriorly. Atheromatous calcification of the aorta and coronaries. Mediastinum/Nodes: Borderline thickening of the lower esophagus which appear circumferential and is overall mild. There is a ovoid high-density structure in the lower third of the esophagus measuring 7 mm in length. No mass or adenopathy. Lungs/Pleura: Minimal dependent atelectasis. There is no edema, consolidation, effusion, or pneumothorax. Ground-glass nodule close to the right minor fissure measuring 6 mm in average diameter. Musculoskeletal: No acute finding Review of the MIP images  confirms the above findings. CT ABDOMEN and PELVIS FINDINGS Hepatobiliary: No focal liver abnormality.The motion artifact especially affects the right inferior lobe liver and the fundus of the gallbladder. No evidence of biliary stone or obstruction. Pancreas: Low-density areas thrush at subcentimeter low-density areas in the uncinate and pancreatic body which could be areas of fat or cyst. No worrisome finding. Spleen: Unremarkable. Adrenals/Urinary Tract: Negative adrenals. No hydronephrosis or stone. Tiny bilateral renal cortical cystic densities. Renal cortical scarring greater on the left. Thick walled appearance of the bladder diffusely with small superiorly projecting diverticulum. There is some gas in the urinary bladder. Stomach/Bowel:  No obstruction.  No visible bowel inflammation. Vascular/Lymphatic: No acute vascular abnormality. Atheromatous calcification of the aorta and iliacs. No mass or adenopathy. Reproductive:Hysterectomy. Other: No ascites or pneumoperitoneum. Musculoskeletal: Remote compression fracture of L1 with advanced height loss and retropulsion narrowing the midline spinal canal to 4-5 mm anterior to posterior. Remote sacral insufficiency fracture Review of the MIP images confirms the above findings. IMPRESSION: Chest CTA: 1. Negative for pulmonary embolism. 2. Borderline lower esophageal thickening, correlate for reflux. There is an ovoid high-density structure at the lower third of the esophagus, question recent pill ingestion with incomplete clearance. 3. Atherosclerosis including the coronary arteries. 4. 6 mm ground-glass nodule along the right minor fissure. If appropriate for comorbidities, initial follow-up with CT at 6 months is recommended to confirm persistence. If persistent, repeat CT is recommended every 2 years until 5 years of stability has been established. This recommendation follows the consensus statement: Guidelines for Management of Incidental Pulmonary Nodules  Detected on CT Images: From the Fleischner Society 2017; Radiology 2017; 284:228-243. Abdominal CT: 1. No acute finding or specific cause for symptoms. 2. Remote L1 compression fracture with advanced height loss and spinal stenosis from the degree of retropulsion. 3. Bladder wall thickening with small bubble of gas, correlate with urinalysis. 4. Atherosclerosis. Electronically Signed   By: Tiburcio Pea M.D.   On: 05/29/2023 03:43   CT ABDOMEN PELVIS W CONTRAST  Result Date: 05/29/2023 CLINICAL DATA:  Right lower quadrant abdominal pain. High probability for pulmonary embolism. EXAM: CT ANGIOGRAPHY CHEST CT ABDOMEN AND PELVIS WITH CONTRAST TECHNIQUE: Multidetector CT imaging of the chest  was performed using the standard protocol during bolus administration of intravenous contrast. Multiplanar CT image reconstructions and MIPs were obtained to evaluate the vascular anatomy. Multidetector CT imaging of the abdomen and pelvis was performed using the standard protocol during bolus administration of intravenous contrast. RADIATION DOSE REDUCTION: This exam was performed according to the departmental dose-optimization program which includes automated exposure control, adjustment of the mA and/or kV according to patient size and/or use of iterative reconstruction technique. CONTRAST:  OMNIPAQUE IOHEXOL 350 MG/ML SOLN COMPARISON:  04/19/2014 FINDINGS: CTA CHEST FINDINGS Cardiovascular: Satisfactory opacification of the pulmonary arteries to the segmental level. No evidence of pulmonary embolism. Enlarged heart with small pericardial effusion seen posteriorly. Atheromatous calcification of the aorta and coronaries. Mediastinum/Nodes: Borderline thickening of the lower esophagus which appear circumferential and is overall mild. There is a ovoid high-density structure in the lower third of the esophagus measuring 7 mm in length. No mass or adenopathy. Lungs/Pleura: Minimal dependent atelectasis. There is no edema,  consolidation, effusion, or pneumothorax. Ground-glass nodule close to the right minor fissure measuring 6 mm in average diameter. Musculoskeletal: No acute finding Review of the MIP images confirms the above findings. CT ABDOMEN and PELVIS FINDINGS Hepatobiliary: No focal liver abnormality.The motion artifact especially affects the right inferior lobe liver and the fundus of the gallbladder. No evidence of biliary stone or obstruction. Pancreas: Low-density areas thrush at subcentimeter low-density areas in the uncinate and pancreatic body which could be areas of fat or cyst. No worrisome finding. Spleen: Unremarkable. Adrenals/Urinary Tract: Negative adrenals. No hydronephrosis or stone. Tiny bilateral renal cortical cystic densities. Renal cortical scarring greater on the left. Thick walled appearance of the bladder diffusely with small superiorly projecting diverticulum. There is some gas in the urinary bladder. Stomach/Bowel:  No obstruction.  No visible bowel inflammation. Vascular/Lymphatic: No acute vascular abnormality. Atheromatous calcification of the aorta and iliacs. No mass or adenopathy. Reproductive:Hysterectomy. Other: No ascites or pneumoperitoneum. Musculoskeletal: Remote compression fracture of L1 with advanced height loss and retropulsion narrowing the midline spinal canal to 4-5 mm anterior to posterior. Remote sacral insufficiency fracture Review of the MIP images confirms the above findings. IMPRESSION: Chest CTA: 1. Negative for pulmonary embolism. 2. Borderline lower esophageal thickening, correlate for reflux. There is an ovoid high-density structure at the lower third of the esophagus, question recent pill ingestion with incomplete clearance. 3. Atherosclerosis including the coronary arteries. 4. 6 mm ground-glass nodule along the right minor fissure. If appropriate for comorbidities, initial follow-up with CT at 6 months is recommended to confirm persistence. If persistent, repeat CT is  recommended every 2 years until 5 years of stability has been established. This recommendation follows the consensus statement: Guidelines for Management of Incidental Pulmonary Nodules Detected on CT Images: From the Fleischner Society 2017; Radiology 2017; 284:228-243. Abdominal CT: 1. No acute finding or specific cause for symptoms. 2. Remote L1 compression fracture with advanced height loss and spinal stenosis from the degree of retropulsion. 3. Bladder wall thickening with small bubble of gas, correlate with urinalysis. 4. Atherosclerosis. Electronically Signed   By: Tiburcio Pea M.D.   On: 05/29/2023 03:43    Procedures Procedures    Medications Ordered in ED Medications  sodium chloride 0.9 % bolus 500 mL (0 mLs Intravenous Stopped 05/29/23 0543)  cefTRIAXone (ROCEPHIN) 1 g in sodium chloride 0.9 % 100 mL IVPB (0 g Intravenous Stopped 05/29/23 0443)  iohexol (OMNIPAQUE) 350 MG/ML injection 100 mL (100 mLs Intravenous Contrast Given 05/29/23 0233)    ED  Course/ Medical Decision Making/ A&P                                 Medical Decision Making Amount and/or Complexity of Data Reviewed Labs: ordered. Radiology: ordered.  Risk Prescription drug management.   Patient with history of arthritis on chronic immunosuppression, DVT on anticoagulation, recent COVID-19 infection here for evaluation of right upper quadrant pain, generalized weakness.  She is nontoxic-appearing on evaluation with no focal neurologic deficits.  She has mild right upper quadrant tenderness on exam without peritoneal findings.  Given her comorbidities a CTA PE study was obtained as well as a CT abdomen pelvis.  Imaging is negative for PE.  Just do show gas in her bladder as well as bladder thickening.  UA is concerning for UTI.  Would start on antibiotics given her symptoms.  BMP with mild hyponatremia, mild elevation in BUN and creatinine when compared to baseline.  She was treated with IV fluid hydration.  Hepatic  panel is unremarkable.  No current evidence of sepsis.  Discussed with patient and daughter findings of studies.  Current clinical picture is not consistent with cholecystitis.  Discussed pulmonary nodule, also discussed esophageal thickening.  Discussed option for admission for IV antibiotics and further monitoring, particularly given her weakness and daughter declines at this time.  They do have a lot of support at home.  Plan to discharge home with antibiotics with very close return precautions.        Final Clinical Impression(s) / ED Diagnoses Final diagnoses:  Acute UTI  Right upper quadrant abdominal pain  Dehydration  Pulmonary nodule    Rx / DC Orders ED Discharge Orders          Ordered    cephALEXin (KEFLEX) 500 MG capsule  2 times daily        05/29/23 0518              Tilden Fossa, MD 05/29/23 [phone]

## 2023-05-31 LAB — URINE CULTURE: Culture: >=100000 — AB

## 2023-06-01 ENCOUNTER — Telehealth (HOSPITAL_BASED_OUTPATIENT_CLINIC_OR_DEPARTMENT_OTHER): Payer: Self-pay | Admitting: *Deleted

## 2023-06-01 NOTE — Telephone Encounter (Signed)
Post ED Visit - Positive Culture Follow-up  Culture report reviewed by antimicrobial stewardship pharmacist: [facility] []  Enzo Bi, Pharm.D. []  Celedonio Miyamoto, Pharm.D., BCPS AQ-ID []  Garvin Fila, Pharm.D., BCPS [x]  Georgina Pillion, Pharm.D., BCPS []  [place], [address].D., BCPS, AAHIVP []  Estella Husk, Pharm.D., BCPS, AAHIVP []  Lysle Pearl, PharmD, BCPS []  Phillips Climes, PharmD, BCPS []  Agapito Games, PharmD, BCPS []  Verlan Friends, PharmD []  Mervyn Gay, PharmD, BCPS []  Vinnie Level, PharmD  [facility] []  Len Childs, PharmD []  Greer Pickerel, PharmD []  Adalberto Cole, PharmD []  Perlie Gold, Rph []  Lonell Face) Jean Rosenthal, PharmD []  Earl Many, PharmD []  Junita Push, PharmD []  Dorna Leitz, PharmD []  Terrilee Files, PharmD []  Lynann Beaver, PharmD []  Keturah Barre, PharmD []  Loralee Pacas, PharmD []  Bernadene Person, PharmD   Positive urine culture Treated with Cephalexin, organism sensitive to the same and no further patient follow-up is required at this time.  [facility] 06/01/2023, 10:25 AM
